# Patient Record
Sex: Male | Born: 1950
Health system: Southern US, Community
[De-identification: ages and names within clinical notes are randomized; demographics above are authoritative.]

## PROBLEM LIST (undated history)

## (undated) DIAGNOSIS — M1990 Unspecified osteoarthritis, unspecified site: Secondary | ICD-10-CM

## (undated) DIAGNOSIS — Z973 Presence of spectacles and contact lenses: Secondary | ICD-10-CM

## (undated) DIAGNOSIS — I10 Essential (primary) hypertension: Secondary | ICD-10-CM

## (undated) DIAGNOSIS — I493 Ventricular premature depolarization: Secondary | ICD-10-CM

## (undated) DIAGNOSIS — R269 Unspecified abnormalities of gait and mobility: Secondary | ICD-10-CM

## (undated) DIAGNOSIS — I499 Cardiac arrhythmia, unspecified: Secondary | ICD-10-CM

## (undated) DIAGNOSIS — C4490 Unspecified malignant neoplasm of skin, unspecified: Secondary | ICD-10-CM

## (undated) DIAGNOSIS — I639 Cerebral infarction, unspecified: Secondary | ICD-10-CM

## (undated) HISTORY — PX: COLONOSCOPY: SHX174

## (undated) HISTORY — DX: Unspecified abnormalities of gait and mobility: R26.9

## (undated) HISTORY — PX: SKIN CANCER EXCISION: SHX779

## (undated) HISTORY — PX: TOTAL HIP ARTHROPLASTY: SHX124

## (undated) HISTORY — PX: WISDOM TOOTH EXTRACTION: SHX21

---

## 2016-10-15 ENCOUNTER — Ambulatory Visit (INDEPENDENT_AMBULATORY_CARE_PROVIDER_SITE_OTHER): Payer: BLUE CROSS/BLUE SHIELD | Admitting: Neurology

## 2016-10-15 ENCOUNTER — Encounter: Payer: Self-pay | Admitting: Neurology

## 2016-10-15 VITALS — BP 104/73 | HR 82 | Ht 69.0 in | Wt 176.0 lb

## 2016-10-15 DIAGNOSIS — M25551 Pain in right hip: Secondary | ICD-10-CM

## 2016-10-15 DIAGNOSIS — R269 Unspecified abnormalities of gait and mobility: Secondary | ICD-10-CM

## 2016-10-15 DIAGNOSIS — M25552 Pain in left hip: Secondary | ICD-10-CM

## 2016-10-15 HISTORY — DX: Unspecified abnormalities of gait and mobility: R26.9

## 2016-10-15 NOTE — Progress Notes (Signed)
Reason for visit: gait disorder  Referring physician: Dr. Lyn Hollingshead is a 65 y.o. male  History of present illness:  Jose Kaufman is a 65 year old right-handed white male with a history of problems with walking and leg discomfort that began about one year ago. The patient indicates that he began having some problems with the right hip with some discomfort going down into the thigh to the knee. He denies pain into the groin area. The pain comes on with standing and weightbearing, it will tend to get better if he walks a ways. The patient more recently began having similar discomfort in the left hip and left thigh, but this pain is not as bad as it is on the right. If the patient rolls on his right side with sleeping at night, this will also produce pain. He denies any pain in the back or neck. He denies any issues controlling the bowels or the bladder. The patient has not had any falls, but he does have difficulty with walking, he may feel somewhat staggery. The patient has undergone a recent CAT scan of the abdomen and pelvis, he was found to have severe degenerative arthritis in the hips bilaterally, right greater than left. The patient is sent to this office for an evaluation of the gait disturbance.  Past Medical History:  Diagnosis Date  . Hypertension     History reviewed. No pertinent surgical history.  Family History  Problem Relation Age of Onset  . Leukemia Mother   . Pneumonia Father     Social history:  reports that he quit smoking about 24 years ago. His smoking use included Cigarettes. He has never used smokeless tobacco. He reports that he drinks alcohol. He reports that he does not use drugs.  Medications:  Prior to Admission medications   Medication Sig Start Date End Date Taking? Authorizing Provider  glucosamine-chondroitin 500-400 MG tablet Take 1 tablet by mouth 3 (three) times daily.   Yes Historical Provider, MD  lisinopril-hydrochlorothiazide  Reita May) 20-12.5 MG tablet  10/09/16  Yes Historical Provider, MD  naproxen sodium (ANAPROX) 220 MG tablet Take 220 mg by mouth 2 (two) times daily with a meal.   Yes Historical Provider, MD  Omega-3 Fatty Acids (FISH OIL PO) Take by mouth daily.   Yes Historical Provider, MD  TURMERIC CURCUMIN PO Take by mouth daily.   Yes Historical Provider, MD      Allergies  Allergen Reactions  . Penicillins     ROS:  Out of a complete 14 system review of symptoms, the patient complains only of the following symptoms, and all other reviewed systems are negative.  Weight loss Blood in the urine Joint pain, aching muscles  Blood pressure 104/73, pulse 82, height 5\' 9"  (1.753 m), weight 176 lb (79.8 kg).  Physical Exam  General: The patient is alert and cooperative at the time of the examination.  Eyes: Pupils are equal, round, and reactive to light. Discs are flat bilaterally.  Neck: The neck is supple, no carotid bruits are noted.  Respiratory: The respiratory examination is clear.  Cardiovascular: The cardiovascular examination reveals a regular rate and rhythm, no obvious murmurs or rubs are noted.  Neuromuscular: Range of movement of the hip joints is severely limited with rotational movement, and with abduction of the legs with standing. No significant pain is noted with passive range of movement.  Skin: Extremities are without significant edema.  Neurologic Exam  Mental status: The patient is alert and  oriented x 3 at the time of the examination. The patient has apparent normal recent and remote memory, with an apparently normal attention span and concentration ability.  Cranial nerves: Facial symmetry is present. There is good sensation of the face to pinprick and soft touch bilaterally. The strength of the facial muscles and the muscles to head turning and shoulder shrug are normal bilaterally. Speech is well enunciated, no aphasia or dysarthria is noted. Extraocular  movements are full. Visual fields are full. The tongue is midline, and the patient has symmetric elevation of the soft palate. No obvious hearing deficits are noted.  Motor: The motor testing reveals 5 over 5 strength of all 4 extremities. Good symmetric motor tone is noted throughout.  Sensory: Sensory testing is intact to pinprick, soft touch, vibration sensation, and position sense on all 4 extremities. No evidence of extinction is noted.  Coordination: Cerebellar testing reveals good finger-nose-finger and heel-to-shin bilaterally.  Gait and station: Gait is associated with taking short shuffling steps, slightly wide-based. Tandem gait is unsteady. Romberg is negative. No drift is seen.  Reflexes: Deep tendon reflexes are symmetric and normal in the arms, but reflexes are brisk in the legs, crossed adductor reflexes are seen bilaterally. Toes are upgoing bilaterally.   Assessment/Plan:  1. Bilateral hip pain, degenerative arthritis bilaterally, right greater than left  2. Gait disturbance  The patient appears to have 2 separate problems. The patient has evidence of significant degenerative arthritis of both hips, right greater than left. The pain that he is having with weightbearing is consistent with this. The patient has an unusual gait pattern, taking short shuffling steps, with gait instability. Examination shows increased reflexes in the legs and bilateral Babinski's. The patient may have a myelopathy over and above the hip arthritis. The patient will be set up for blood work today, he will have MRI evaluation of the cervical and thoracic spine. I will contact him with the results.  Jill Alexanders MD 10/15/2016 1:54 PM  [facility] [address] [place], [place] [address]  Phone [phone] Fax [phone]

## 2016-10-16 ENCOUNTER — Telehealth: Payer: Self-pay | Admitting: Neurology

## 2016-10-16 LAB — COMPREHENSIVE METABOLIC PANEL
ALBUMIN: 4.4 g/dL (ref 3.6–4.8)
ALK PHOS: 74 IU/L (ref 39–117)
ALT: 11 IU/L (ref 0–44)
AST: 12 IU/L (ref 0–40)
Albumin/Globulin Ratio: 1.5 (ref 1.2–2.2)
BUN / CREAT RATIO: 15 (ref 10–24)
BUN: 23 mg/dL (ref 8–27)
Bilirubin Total: 0.6 mg/dL (ref 0.0–1.2)
CO2: 25 mmol/L (ref 18–29)
CREATININE: 1.5 mg/dL — AB (ref 0.76–1.27)
Calcium: 9.2 mg/dL (ref 8.6–10.2)
Chloride: 97 mmol/L (ref 96–106)
GFR calc non Af Amer: 49 mL/min/{1.73_m2} — ABNORMAL LOW (ref >59)
GFR, EST AFRICAN AMERICAN: 56 mL/min/{1.73_m2} — AB (ref >59)
GLOBULIN, TOTAL: 2.9 g/dL (ref 1.5–4.5)
GLUCOSE: 84 mg/dL (ref 65–99)
Potassium: 4.3 mmol/L (ref 3.5–5.2)
SODIUM: 139 mmol/L (ref 134–144)
TOTAL PROTEIN: 7.3 g/dL (ref 6.0–8.5)

## 2016-10-16 LAB — RPR: RPR Ser Ql: NONREACTIVE

## 2016-10-16 LAB — SEDIMENTATION RATE: SED RATE: 5 mm/h (ref 0–30)

## 2016-10-16 LAB — VITAMIN B12: Vitamin B-12: 192 pg/mL — ABNORMAL LOW (ref 211–946)

## 2016-10-16 LAB — HTLV I+II ANTIBODIES, (EIA), BLD: HTLV I/II AB: NEGATIVE

## 2016-10-16 NOTE — Telephone Encounter (Signed)
I called again, again no answer, we will try to contact him tomorrow.

## 2016-10-16 NOTE — Telephone Encounter (Signed)
I tried to call the patient, no answer. Blood work shows a low B12 level, some renal insufficiency.  We will try to have him come in for a B12 shot, start him on oral B12 supplementation. I will call back later.

## 2016-10-17 NOTE — Telephone Encounter (Signed)
I called patient. No answer and no vm.

## 2016-10-17 NOTE — Telephone Encounter (Signed)
Tried to call pt again but still unable to reach him.

## 2016-10-18 NOTE — Telephone Encounter (Signed)
Was able to reach pt at his work #. Appt scheduled for B12 injection and pt agreed to start on OTC B12 supplement. Will call back w/ questions/concerns.

## 2016-10-29 ENCOUNTER — Other Ambulatory Visit: Payer: Self-pay | Admitting: *Deleted

## 2016-10-29 ENCOUNTER — Ambulatory Visit (INDEPENDENT_AMBULATORY_CARE_PROVIDER_SITE_OTHER): Payer: BLUE CROSS/BLUE SHIELD | Admitting: *Deleted

## 2016-10-29 DIAGNOSIS — E538 Deficiency of other specified B group vitamins: Secondary | ICD-10-CM | POA: Diagnosis not present

## 2016-10-29 MED ORDER — CYANOCOBALAMIN 1000 MCG/ML IJ SOLN
1000.0000 ug | Freq: Once | INTRAMUSCULAR | Status: AC
  Administered 2016-10-29: 1000 ug via INTRAMUSCULAR

## 2017-01-24 ENCOUNTER — Ambulatory Visit (INDEPENDENT_AMBULATORY_CARE_PROVIDER_SITE_OTHER): Payer: Self-pay

## 2017-01-24 ENCOUNTER — Ambulatory Visit (INDEPENDENT_AMBULATORY_CARE_PROVIDER_SITE_OTHER): Payer: BLUE CROSS/BLUE SHIELD | Admitting: Orthopaedic Surgery

## 2017-01-24 DIAGNOSIS — M1611 Unilateral primary osteoarthritis, right hip: Secondary | ICD-10-CM | POA: Diagnosis not present

## 2017-01-24 DIAGNOSIS — M25551 Pain in right hip: Secondary | ICD-10-CM | POA: Diagnosis not present

## 2017-01-24 NOTE — Progress Notes (Deleted)
   Office Visit Note   Patient: Jose Kaufman           Date of Birth: 13-Mar-1951           MRN: [id number] Visit Date: 01/24/2017              Requested by: Michel Harrow, [place], [place] [address] PCP: Michel Harrow, PA-C   Assessment & Plan: Visit Diagnoses:  1. Pain in right hip   2. Unilateral primary osteoarthritis, right hip     Plan: ***  Follow-Up Instructions: Return if symptoms worsen or fail to improve.   Orders:  Orders Placed This Encounter  Procedures  . XR HIP UNILAT W OR W/O PELVIS 1V RIGHT   No orders of the defined types were placed in this encounter.     Procedures: No procedures performed   Clinical Data: No additional findings.   Subjective: No chief complaint on file.   HPI  Review of Systems   Objective: Vital Signs: There were no vitals taken for this visit.  Physical Exam  Ortho Exam  Specialty Comments:  No specialty comments available.  Imaging: No results found.   PMFS History: Patient Active Problem List   Diagnosis Date Noted  . Bilateral hip pain 10/15/2016  . Abnormality of gait 10/15/2016   Past Medical History:  Diagnosis Date  . Abnormality of gait 10/15/2016  . Hypertension     Family History  Problem Relation Age of Onset  . Leukemia Mother   . Pneumonia Father     No past surgical history on file. Social History   Occupational History  . [employer]    Social History Main Topics  . Smoking status: Former Smoker    Types: Cigarettes    Quit date: 05/20/1992  . Smokeless tobacco: Never Used  . Alcohol use Yes     Comment: Occasional  . Drug use: No  . Sexual activity: Not on file       +

## 2017-01-24 NOTE — Progress Notes (Signed)
Office Visit Note   Patient: Jose Kaufman           Date of Birth: 09/26/1951           MRN: [id number] Visit Date: 01/24/2017              Requested by: Michel Harrow, [place], [place] [address] PCP: Michel Harrow, PA-C   Assessment & Plan: Visit Diagnoses:  1. Pain in right hip   2. Unilateral primary osteoarthritis, right hip     Plan: He would definitely like to schedule hip replacement surgery sometime for a month or more from now and is warmer weather. We long thorough discussion about hip replacement surgery including a thorough discussion of the risks and benefits of surgery. I showed him a hip model and gave him a copy of his x-rays and we talked about intraoperative and postoperative course. At this point conservative treatment would not work anymore. He can even provide a steroid injection in that right hip due to no joint space remaining at all. He is tried anti-inflammatories, rest, ice, heat, activity modification and walking with assistive device. He is already very thin individual as well. He will call when he decides to have the surgery scheduled.  Follow-Up Instructions: Return if symptoms worsen or fail to improve.   Orders:  Orders Placed This Encounter  Procedures  . XR HIP UNILAT W OR W/O PELVIS 1V RIGHT   No orders of the defined types were placed in this encounter.     Procedures: No procedures performed   Clinical Data: No additional findings.   Subjective: No chief complaint on file. The patient comes in with a chief complaint of bilateral hip pain with right worse than left. This been going on for over a year now. He's had a steroid injection that hip at one point and that helped some. He is now limping severely. He is tried anti-inflammatories, rest, heat, ice, activity modification and walking with assistive device. His pain is daily. It is in his groin. Is 10 out of 10. It's detrimentally affected his activities  daily living, his quality of life, and his mobility.  HPI  Review of Systems He denies any chest pain, headache, shortness of breath, fever, chills, nausea, vomiting.  Objective: Vital Signs: There were no vitals taken for this visit.  Physical Exam He is alert and oriented 3 and in no acute distress. Ortho Exam Lamination of both hip shows essentially almost no internal or external rotation of either hip Specialty Comments:  No specialty comments available.  Imaging: Xr Hip Unilat W Or W/o Pelvis 1v Right  Result Date: 01/24/2017 AP pelvis and lateral of his right hip show severe end-stage arthritis of both hips. The right is much worse than left. The right has complete loss of joint space. There severe trigger osteophytes and significant cystic changes with femoral head and acetabulum again there is no joint space remaining at all. He has severe arthritis on his left side as well.    PMFS History: Patient Active Problem List   Diagnosis Date Noted  . Bilateral hip pain 10/15/2016  . Abnormality of gait 10/15/2016   Past Medical History:  Diagnosis Date  . Abnormality of gait 10/15/2016  . Hypertension     Family History  Problem Relation Age of Onset  . Leukemia Mother   . Pneumonia Father     No past surgical history on file. Social History   Occupational History  .  [employer]    Social History Main Topics  . Smoking status: Former Smoker    Types: Cigarettes    Quit date: 05/20/1992  . Smokeless tobacco: Never Used  . Alcohol use Yes     Comment: Occasional  . Drug use: No  . Sexual activity: Not on file

## 2017-01-29 ENCOUNTER — Telehealth (INDEPENDENT_AMBULATORY_CARE_PROVIDER_SITE_OTHER): Payer: Self-pay | Admitting: Orthopaedic Surgery

## 2017-01-29 NOTE — Telephone Encounter (Signed)
Pt needs to schedule surgery, he stated he has emailed and tried to call as well.  Please give him a call back at [phone]

## 2017-01-31 NOTE — Telephone Encounter (Signed)
Spoke with patient yesterday and scheduled surgery. 

## 2017-02-26 NOTE — Progress Notes (Signed)
Surgery on 03/08/2017.  Preo pon 03/01/17.  Need orders in EPIC  Thank YOu.

## 2017-02-27 ENCOUNTER — Other Ambulatory Visit (INDEPENDENT_AMBULATORY_CARE_PROVIDER_SITE_OTHER): Payer: Self-pay | Admitting: Physician Assistant

## 2017-02-28 ENCOUNTER — Other Ambulatory Visit (HOSPITAL_COMMUNITY): Payer: Self-pay | Admitting: Emergency Medicine

## 2017-02-28 NOTE — Patient Instructions (Signed)
Jashua Knaak  02/28/2017   Your procedure is scheduled on: 03-08-17  Report to [facility] Main  Entrance take [facility]  elevators to 3rd floor to  [place] at 930AM.  Call this number if you have problems the morning of surgery [phone]   Remember: ONLY 1 PERSON MAY GO WITH YOU TO SHORT STAY TO GET  READY MORNING OF [facility].  Do not eat food or drink liquids :After Midnight.     Take these medicines the morning of surgery with A SIP OF WATER:  none                                You may not have any metal on your body including hair pins and              piercings  Do not wear jewelry, make-up, lotions, powders or perfumes, deodorant             Do not wear nail polish.  Do not shave  48 hours prior to surgery.              Men may shave face and neck.   Do not bring valuables to the hospital. [place].  Contacts, dentures or bridgework may not be worn into surgery.  Leave suitcase in the car. After surgery it may be brought to your room.               Please read over the following fact sheets you were given: _____________________________________________________________________             [facility] - Preparing for Surgery Before surgery, you can play an important role.  Because skin is not sterile, your skin needs to be as free of germs as possible.  You can reduce the number of germs on your skin by washing with CHG (chlorahexidine gluconate) soap before surgery.  CHG is an antiseptic cleaner which kills germs and bonds with the skin to continue killing germs even after washing. Please DO NOT use if you have an allergy to CHG or antibacterial soaps.  If your skin becomes reddened/irritated stop using the CHG and inform your nurse when you arrive at Short Stay. Do not shave (including legs and underarms) for at least 48 hours prior to the first CHG shower.  You may shave your  face/neck. Please follow these instructions carefully:  1.  Shower with CHG Soap the night before surgery and the  morning of Surgery.  2.  If you choose to wash your hair, wash your hair first as usual with your  normal  shampoo.  3.  After you shampoo, rinse your hair and body thoroughly to remove the  shampoo.                           4.  Use CHG as you would any other liquid soap.  You can apply chg directly  to the skin and wash                       Gently with a scrungie or clean washcloth.  5.  Apply the CHG Soap to your body ONLY  FROM THE NECK DOWN.   Do not use on face/ open                           Wound or open sores. Avoid contact with eyes, ears mouth and genitals (private parts).                       Wash face,  Genitals (private parts) with your normal soap.             6.  Wash thoroughly, paying special attention to the area where your surgery  will be performed.  7.  Thoroughly rinse your body with warm water from the neck down.  8.  DO NOT shower/wash with your normal soap after using and rinsing off  the CHG Soap.                9.  Pat yourself dry with a clean towel.            10.  Wear clean pajamas.            11.  Place clean sheets on your bed the night of your first shower and do not  sleep with pets. Day of Surgery : Do not apply any lotions/deodorants the morning of surgery.  Please wear clean clothes to the hospital/surgery center.  FAILURE TO FOLLOW THESE INSTRUCTIONS MAY RESULT IN THE CANCELLATION OF YOUR SURGERY PATIENT SIGNATURE_________________________________  NURSE SIGNATURE__________________________________  ________________________________________________________________________   Adam Phenix  An incentive spirometer is a tool that can help keep your lungs clear and active. This tool measures how well you are filling your lungs with each breath. Taking long deep breaths may help reverse or decrease the chance of developing breathing  (pulmonary) problems (especially infection) following:  A long period of time when you are unable to move or be active. BEFORE THE PROCEDURE   If the spirometer includes an indicator to show your best effort, your nurse or respiratory therapist will set it to a desired goal.  If possible, sit up straight or lean slightly forward. Try not to slouch.  Hold the incentive spirometer in an upright position. INSTRUCTIONS FOR USE  1. Sit on the edge of your bed if possible, or sit up as far as you can in bed or on a chair. 2. Hold the incentive spirometer in an upright position. 3. Breathe out normally. 4. Place the mouthpiece in your mouth and seal your lips tightly around it. 5. Breathe in slowly and as deeply as possible, raising the piston or the ball toward the top of the column. 6. Hold your breath for 3-5 seconds or for as long as possible. Allow the piston or ball to fall to the bottom of the column. 7. Remove the mouthpiece from your mouth and breathe out normally. 8. Rest for a few seconds and repeat Steps 1 through 7 at least 10 times every 1-2 hours when you are awake. Take your time and take a few normal breaths between deep breaths. 9. The spirometer may include an indicator to show your best effort. Use the indicator as a goal to work toward during each repetition. 10. After each set of 10 deep breaths, practice coughing to be sure your lungs are clear. If you have an incision (the cut made at the time of surgery), support your incision when coughing by placing a pillow or rolled up towels firmly against it. Once you  are able to get out of bed, walk around indoors and cough well. You may stop using the incentive spirometer when instructed by your caregiver.  RISKS AND COMPLICATIONS  Take your time so you do not get dizzy or light-headed.  If you are in pain, you may need to take or ask for pain medication before doing incentive spirometry. It is harder to take a deep breath if you  are having pain. AFTER USE  Rest and breathe slowly and easily.  It can be helpful to keep track of a log of your progress. Your caregiver can provide you with a simple table to help with this. If you are using the spirometer at home, follow these instructions: [place] IF:   You are having difficultly using the spirometer.  You have trouble using the spirometer as often as instructed.  Your pain medication is not giving enough relief while using the spirometer.  You develop fever of 100.5 F (38.1 C) or higher. SEEK IMMEDIATE MEDICAL CARE IF:   You cough up bloody sputum that had not been present before.  You develop fever of 102 F (38.9 C) or greater.  You develop worsening pain at or near the incision site. MAKE SURE YOU:   Understand these instructions.  Will watch your condition.  Will get help right away if you are not doing well or get worse. Document Released: 04/08/2007 Document Revised: 02/18/2012 Document Reviewed: 06/09/2007 [facility] Patient Information 2014 [place], [place].   ________________________________________________________________________

## 2017-03-01 ENCOUNTER — Encounter (HOSPITAL_COMMUNITY)
Admission: RE | Admit: 2017-03-01 | Discharge: 2017-03-01 | Disposition: A | Discharge status: Home / Self Care | Payer: BLUE CROSS/BLUE SHIELD | Source: Ambulatory Visit | Attending: Orthopaedic Surgery | Admitting: Orthopaedic Surgery

## 2017-03-01 ENCOUNTER — Encounter (HOSPITAL_COMMUNITY): Payer: Self-pay

## 2017-03-01 DIAGNOSIS — I10 Essential (primary) hypertension: Secondary | ICD-10-CM | POA: Insufficient documentation

## 2017-03-01 DIAGNOSIS — M1611 Unilateral primary osteoarthritis, right hip: Secondary | ICD-10-CM | POA: Insufficient documentation

## 2017-03-01 DIAGNOSIS — Z01810 Encounter for preprocedural cardiovascular examination: Secondary | ICD-10-CM | POA: Diagnosis present

## 2017-03-01 DIAGNOSIS — Z01812 Encounter for preprocedural laboratory examination: Secondary | ICD-10-CM | POA: Diagnosis not present

## 2017-03-01 LAB — CBC
HCT: 43.6 % (ref 39.0–52.0)
HEMOGLOBIN: 14.6 g/dL (ref 13.0–17.0)
MCH: 30 pg (ref 26.0–34.0)
MCHC: 33.5 g/dL (ref 30.0–36.0)
MCV: 89.7 fL (ref 78.0–100.0)
PLATELETS: 260 10*3/uL (ref 150–400)
RBC: 4.86 MIL/uL (ref 4.22–5.81)
RDW: 13.4 % (ref 11.5–15.5)
WBC: 8.6 10*3/uL (ref 4.0–10.5)

## 2017-03-01 LAB — BASIC METABOLIC PANEL
ANION GAP: 6 (ref 5–15)
BUN: 29 mg/dL — AB (ref 6–20)
CHLORIDE: 104 mmol/L (ref 101–111)
CO2: 25 mmol/L (ref 22–32)
Calcium: 9.5 mg/dL (ref 8.9–10.3)
Creatinine, Ser: 1.24 mg/dL (ref 0.61–1.24)
GFR calc Af Amer: >60 mL/min (ref >60)
GLUCOSE: 96 mg/dL (ref 65–99)
POTASSIUM: 4.2 mmol/L (ref 3.5–5.1)
Sodium: 135 mmol/L (ref 135–145)

## 2017-03-01 LAB — SURGICAL PCR SCREEN
MRSA, PCR: NEGATIVE
Staphylococcus aureus: NEGATIVE

## 2017-03-01 LAB — ABO/RH: ABO/RH(D): O POS

## 2017-03-07 ENCOUNTER — Inpatient Hospital Stay (HOSPITAL_COMMUNITY): Payer: BLUE CROSS/BLUE SHIELD | Admitting: Certified Registered Nurse Anesthetist

## 2017-03-08 ENCOUNTER — Ambulatory Visit (HOSPITAL_COMMUNITY): Payer: BLUE CROSS/BLUE SHIELD

## 2017-03-08 ENCOUNTER — Encounter (HOSPITAL_COMMUNITY)
Admission: RE | Disposition: A | Discharge status: Home / Self Care | Payer: Self-pay | Source: Ambulatory Visit | Attending: Orthopaedic Surgery

## 2017-03-08 ENCOUNTER — Encounter (HOSPITAL_COMMUNITY): Payer: Self-pay | Admitting: *Deleted

## 2017-03-08 ENCOUNTER — Ambulatory Visit (HOSPITAL_COMMUNITY)
Admission: RE | Admit: 2017-03-08 | Discharge: 2017-03-08 | Disposition: A | Discharge status: Home / Self Care | Payer: BLUE CROSS/BLUE SHIELD | Source: Ambulatory Visit | Attending: Orthopaedic Surgery | Admitting: Orthopaedic Surgery

## 2017-03-08 DIAGNOSIS — Z880 Allergy status to penicillin: Secondary | ICD-10-CM | POA: Insufficient documentation

## 2017-03-08 DIAGNOSIS — M1611 Unilateral primary osteoarthritis, right hip: Secondary | ICD-10-CM | POA: Diagnosis not present

## 2017-03-08 DIAGNOSIS — I10 Essential (primary) hypertension: Secondary | ICD-10-CM | POA: Insufficient documentation

## 2017-03-08 DIAGNOSIS — I493 Ventricular premature depolarization: Secondary | ICD-10-CM | POA: Insufficient documentation

## 2017-03-08 DIAGNOSIS — Z419 Encounter for procedure for purposes other than remedying health state, unspecified: Secondary | ICD-10-CM

## 2017-03-08 DIAGNOSIS — Z5309 Procedure and treatment not carried out because of other contraindication: Secondary | ICD-10-CM | POA: Diagnosis not present

## 2017-03-08 DIAGNOSIS — Z87891 Personal history of nicotine dependence: Secondary | ICD-10-CM | POA: Diagnosis not present

## 2017-03-08 DIAGNOSIS — M25551 Pain in right hip: Secondary | ICD-10-CM | POA: Diagnosis present

## 2017-03-08 HISTORY — DX: Essential (primary) hypertension: I10

## 2017-03-08 HISTORY — DX: Ventricular premature depolarization: I49.3

## 2017-03-08 LAB — TYPE AND SCREEN
ABO/RH(D): O POS
Antibody Screen: NEGATIVE

## 2017-03-08 SURGERY — ARTHROPLASTY, HIP, TOTAL, ANTERIOR APPROACH
Anesthesia: Spinal | Site: Hip | Laterality: Right

## 2017-03-08 MED ORDER — TRANEXAMIC ACID 1000 MG/10ML IV SOLN
1000.0000 mg | INTRAVENOUS | Status: DC
  Filled 2017-03-08: qty 10

## 2017-03-08 MED ORDER — PROPOFOL 10 MG/ML IV BOLUS
INTRAVENOUS | Status: AC
  Filled 2017-03-08: qty 20

## 2017-03-08 MED ORDER — FENTANYL CITRATE (PF) 100 MCG/2ML IJ SOLN
INTRAMUSCULAR | Status: AC
  Filled 2017-03-08: qty 2

## 2017-03-08 MED ORDER — CLINDAMYCIN PHOSPHATE 900 MG/50ML IV SOLN
900.0000 mg | INTRAVENOUS | Status: DC
  Filled 2017-03-08: qty 50

## 2017-03-08 MED ORDER — LIDOCAINE 2% (20 MG/ML) 5 ML SYRINGE
INTRAMUSCULAR | Status: AC
  Filled 2017-03-08: qty 5

## 2017-03-08 MED ORDER — MIDAZOLAM HCL 2 MG/2ML IJ SOLN
INTRAMUSCULAR | Status: AC
  Filled 2017-03-08: qty 2

## 2017-03-08 MED ORDER — ONDANSETRON HCL 4 MG/2ML IJ SOLN
INTRAMUSCULAR | Status: AC
  Filled 2017-03-08: qty 2

## 2017-03-08 MED ORDER — LACTATED RINGERS IV SOLN
INTRAVENOUS | Status: DC
  Administered 2017-03-08: 10:00:00 via INTRAVENOUS

## 2017-03-08 MED ORDER — CHLORHEXIDINE GLUCONATE 4 % EX LIQD: 60.0000 mL | Freq: Once | CUTANEOUS | Status: DC

## 2017-03-08 MED ORDER — DEXAMETHASONE SODIUM PHOSPHATE 10 MG/ML IJ SOLN
INTRAMUSCULAR | Status: AC
  Filled 2017-03-08: qty 1

## 2017-03-08 NOTE — Progress Notes (Signed)
Surgery cancelled due to continuing frequent multifocal PVCs noted on monitor. Referring for cardiology evaluation Preparing for discharge, awaiting transport home.

## 2017-03-08 NOTE — Progress Notes (Signed)
Dr. Ermalene Postin, anesthesilogist made aware of frequent PVCs on EKG and now on monitor. No new orders received.

## 2017-03-08 NOTE — H&P (Signed)
TOTAL HIP ADMISSION H&P  Patient is admitted for right total hip arthroplasty.  Subjective:  Chief Complaint: right hip pain  HPI: Jose Kaufman, 66 y.o. male, has a history of pain and functional disability in the right hip(s) due to arthritis and patient has failed non-surgical conservative treatments for greater than 12 weeks to include NSAID's and/or analgesics, corticosteriod injections and activity modification.  Onset of symptoms was gradual starting 2 years ago with rapidlly worsening course since that time.The patient noted no past surgery on the right hip(s).  Patient currently rates pain in the right hip at 10 out of 10 with activity. Patient has night pain, worsening of pain with activity and weight bearing, trendelenberg gait, pain that interfers with activities of daily living, pain with passive range of motion and crepitus. Patient has evidence of subchondral cysts, subchondral sclerosis, periarticular osteophytes and joint space narrowing by imaging studies. This condition presents safety issues increasing the risk of falls.  There is no current active infection.  Patient Active Problem List   Diagnosis Date Noted  . Unilateral primary osteoarthritis, right hip 03/08/2017  . Bilateral hip pain 10/15/2016  . Abnormality of gait 10/15/2016   Past Medical History:  Diagnosis Date  . Abnormality of gait 10/15/2016  . Hypertension     No past surgical history on file.  No prescriptions prior to admission.   Allergies  Allergen Reactions  . Penicillins Other (See Comments)    Has patient had a PCN reaction causing immediate rash, facial/tongue/throat swelling, SOB or lightheadedness with hypotension:unsure Has patient had a PCN reaction causing severe rash involving mucus membranes or skin necrosis:unsure Has patient had a PCN reaction that required hospitalization:No Has patient had a PCN reaction occurring within the last 10 years:No If all of the above answers are "NO",  then may proceed with Cephalosporin use. Childhood reaction (unknown reaction)    Social History  Substance Use Topics  . Smoking status: Former Smoker    Types: Cigarettes    Quit date: 05/20/1992  . Smokeless tobacco: Never Used  . Alcohol use Yes     Comment: Occasional    Family History  Problem Relation Age of Onset  . Leukemia Mother   . Pneumonia Father      Review of Systems  Musculoskeletal: Positive for joint pain.  All other systems reviewed and are negative.   Objective:  Physical Exam  Constitutional: He is oriented to person, place, and time. He appears well-developed and well-nourished.  HENT:  Head: Normocephalic and atraumatic.  Eyes: EOM are normal. Pupils are equal, round, and reactive to light.  Neck: Normal range of motion. Neck supple.  Cardiovascular: Normal rate and regular rhythm.   Respiratory: Effort normal and breath sounds normal.  GI: Soft. Bowel sounds are normal.  Musculoskeletal:       Right hip: He exhibits decreased range of motion, decreased strength, tenderness and bony tenderness.  Neurological: He is alert and oriented to person, place, and time.  Skin: Skin is warm and dry.  Psychiatric: He has a normal mood and affect.    Vital signs in last 24 hours:    Labs:   Estimated body mass index is 25.79 kg/m as calculated from the following:   Height as of 03/01/17: 5\' 8"  (1.727 m).   Weight as of 03/01/17: 169 lb 9.6 oz (76.9 kg).   Imaging Review Plain radiographs demonstrate severe degenerative joint disease of the right hip(s). The bone quality appears to be excellent for age  and reported activity level.  Assessment/Plan:  End stage arthritis, right hip(s)  The patient history, physical examination, clinical judgement of the provider and imaging studies are consistent with end stage degenerative joint disease of the right hip(s) and total hip arthroplasty is deemed medically necessary. The treatment options including  medical management, injection therapy, arthroscopy and arthroplasty were discussed at length. The risks and benefits of total hip arthroplasty were presented and reviewed. The risks due to aseptic loosening, infection, stiffness, dislocation/subluxation,  thromboembolic complications and other imponderables were discussed.  The patient acknowledged the explanation, agreed to proceed with the plan and consent was signed. Patient is being admitted for inpatient treatment for surgery, pain control, PT, OT, prophylactic antibiotics, VTE prophylaxis, progressive ambulation and ADL's and discharge planning.The patient is planning to be discharged home with home health services

## 2017-03-12 ENCOUNTER — Telehealth (INDEPENDENT_AMBULATORY_CARE_PROVIDER_SITE_OTHER): Payer: Self-pay | Admitting: Orthopaedic Surgery

## 2017-03-12 ENCOUNTER — Encounter (INDEPENDENT_AMBULATORY_CARE_PROVIDER_SITE_OTHER): Payer: Self-pay

## 2017-03-12 ENCOUNTER — Telehealth (INDEPENDENT_AMBULATORY_CARE_PROVIDER_SITE_OTHER): Payer: Self-pay | Admitting: *Deleted

## 2017-03-12 NOTE — Telephone Encounter (Signed)
Jose Kaufman will talk with him today

## 2017-03-12 NOTE — Telephone Encounter (Signed)
Faxed revised note to number provided.

## 2017-03-12 NOTE — Telephone Encounter (Signed)
Patient called wanting to know what he should do next since his surgery was canceled for this Friday. CB # [phone]

## 2017-03-12 NOTE — Telephone Encounter (Signed)
Pt called stating he didn't have surgery because he has an irregular heart beat and pt is confused what to do next. If he needs to see a cardiologist or make another appt with Dr. Ninfa Linden.  Pt stated to let the phone ring awhile, he has a hard time getting to the phone but will try to answer as quickly as he can.

## 2017-03-12 NOTE — Telephone Encounter (Signed)
Pt called stating he needs a note for work stating he cannot work due to his hip pain. FAX: [phone] [name]

## 2017-03-12 NOTE — Telephone Encounter (Signed)
Faxed note to provided number

## 2017-03-12 NOTE — Telephone Encounter (Signed)
Please advise 

## 2017-03-12 NOTE — Telephone Encounter (Signed)
Note faxed. Per Hetty Ely will speak with patient on what to do

## 2017-03-12 NOTE — Telephone Encounter (Signed)
Leanne from [employer] called saying that she received a fax from [place] not too long ago, stating the patient cannot come back to work currently due to his pain. She was wanting something more specific with dates for return sent over. Fax # [phone]

## 2017-03-14 ENCOUNTER — Encounter: Payer: Self-pay | Admitting: Cardiology

## 2017-03-14 ENCOUNTER — Encounter (HOSPITAL_COMMUNITY): Payer: Self-pay | Admitting: Cardiology

## 2017-03-14 DIAGNOSIS — I493 Ventricular premature depolarization: Secondary | ICD-10-CM | POA: Insufficient documentation

## 2017-03-14 DIAGNOSIS — I10 Essential (primary) hypertension: Secondary | ICD-10-CM | POA: Insufficient documentation

## 2017-03-14 HISTORY — DX: Essential (primary) hypertension: I10

## 2017-03-14 HISTORY — DX: Ventricular premature depolarization: I49.3

## 2017-03-20 NOTE — Progress Notes (Signed)
Who did patient see for cardiac clearance?  Is clearance being faxed to PST at [phone].  Please Call Gillian Shields RN at [phone] at [facility] regarding this.

## 2017-03-20 NOTE — Progress Notes (Signed)
Need orders placed in epic since orders were cancelled due to surgery cancelled.  Thanks.

## 2017-03-20 NOTE — Progress Notes (Signed)
Clearance from Dr Tollie Eth on chart - 03/19/2017  Office vist- Dr Wynonia Lawman- 03/14/2017- on chart   ECHO-03/18/2017 on chart   Patient's surgery cancelled from 03/08/2017.  Cardiac clearance needed.  See above for clearance.

## 2017-03-21 ENCOUNTER — Inpatient Hospital Stay (INDEPENDENT_AMBULATORY_CARE_PROVIDER_SITE_OTHER): Payer: BLUE CROSS/BLUE SHIELD | Admitting: Orthopaedic Surgery

## 2017-03-21 ENCOUNTER — Other Ambulatory Visit (INDEPENDENT_AMBULATORY_CARE_PROVIDER_SITE_OTHER): Payer: Self-pay | Admitting: Orthopaedic Surgery

## 2017-03-21 DIAGNOSIS — M1611 Unilateral primary osteoarthritis, right hip: Secondary | ICD-10-CM

## 2017-03-21 NOTE — Progress Notes (Signed)
Does this patient have another contact phone number besides the 1-800 work number- which they tell me he is on medical leave or the 288#?Marland Kitchen  I was trying to make sure he knows to arrive at 1145 in the am for surgery and surgery time at 145pm.  I attempted to call on 03/20/17 x 2 and this am on the 288 number with no answer.  Thanks.

## 2017-03-22 ENCOUNTER — Other Ambulatory Visit (INDEPENDENT_AMBULATORY_CARE_PROVIDER_SITE_OTHER): Payer: Self-pay | Admitting: Orthopaedic Surgery

## 2017-03-22 ENCOUNTER — Inpatient Hospital Stay (HOSPITAL_COMMUNITY): Payer: BLUE CROSS/BLUE SHIELD

## 2017-03-22 ENCOUNTER — Inpatient Hospital Stay (HOSPITAL_COMMUNITY): Payer: BLUE CROSS/BLUE SHIELD | Admitting: Certified Registered Nurse Anesthetist

## 2017-03-22 ENCOUNTER — Encounter (HOSPITAL_COMMUNITY): Payer: Self-pay | Admitting: *Deleted

## 2017-03-22 ENCOUNTER — Inpatient Hospital Stay (HOSPITAL_COMMUNITY)
Admission: RE | Admit: 2017-03-22 | Discharge: 2017-03-24 | DRG: 470 | Disposition: A | Discharge status: Home Health | Payer: BLUE CROSS/BLUE SHIELD | Source: Ambulatory Visit | Attending: Orthopaedic Surgery | Admitting: Orthopaedic Surgery

## 2017-03-22 ENCOUNTER — Encounter (HOSPITAL_COMMUNITY)
Admission: RE | Disposition: A | Discharge status: Home Health | Payer: Self-pay | Source: Ambulatory Visit | Attending: Orthopaedic Surgery

## 2017-03-22 DIAGNOSIS — I10 Essential (primary) hypertension: Secondary | ICD-10-CM | POA: Diagnosis present

## 2017-03-22 DIAGNOSIS — Z806 Family history of leukemia: Secondary | ICD-10-CM | POA: Diagnosis not present

## 2017-03-22 DIAGNOSIS — Z419 Encounter for procedure for purposes other than remedying health state, unspecified: Secondary | ICD-10-CM

## 2017-03-22 DIAGNOSIS — I493 Ventricular premature depolarization: Secondary | ICD-10-CM | POA: Diagnosis present

## 2017-03-22 DIAGNOSIS — Z880 Allergy status to penicillin: Secondary | ICD-10-CM | POA: Diagnosis not present

## 2017-03-22 DIAGNOSIS — Z96641 Presence of right artificial hip joint: Secondary | ICD-10-CM

## 2017-03-22 DIAGNOSIS — M1611 Unilateral primary osteoarthritis, right hip: Secondary | ICD-10-CM | POA: Diagnosis present

## 2017-03-22 DIAGNOSIS — Z87891 Personal history of nicotine dependence: Secondary | ICD-10-CM

## 2017-03-22 DIAGNOSIS — Z79891 Long term (current) use of opiate analgesic: Secondary | ICD-10-CM | POA: Diagnosis not present

## 2017-03-22 DIAGNOSIS — Z79899 Other long term (current) drug therapy: Secondary | ICD-10-CM | POA: Diagnosis not present

## 2017-03-22 DIAGNOSIS — R269 Unspecified abnormalities of gait and mobility: Secondary | ICD-10-CM | POA: Diagnosis present

## 2017-03-22 HISTORY — PX: TOTAL HIP ARTHROPLASTY: SHX124

## 2017-03-22 LAB — TYPE AND SCREEN
ABO/RH(D): O POS
Antibody Screen: NEGATIVE

## 2017-03-22 IMAGING — RF DG HIP (WITH PELVIS) OPERATIVE*R*
1 series · 2 of 2 positions shown · non-contrast
Comparison: None.

CLINICAL DATA: Right hip replaced

EXAM:
OPERATIVE RIGHT HIP WITH PELVIS

[Series 1: run · 2 of 2 slices shown]
[im 1/2]
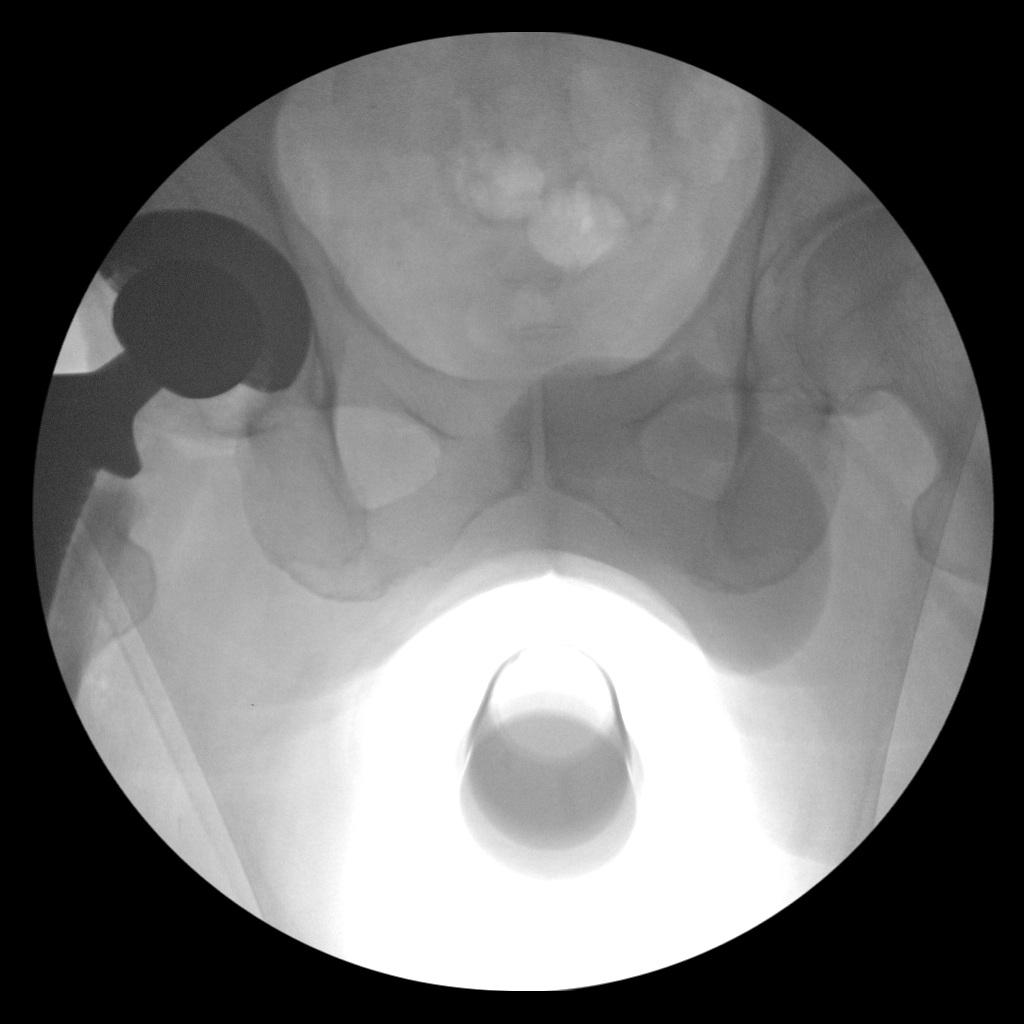
[im 2/2]
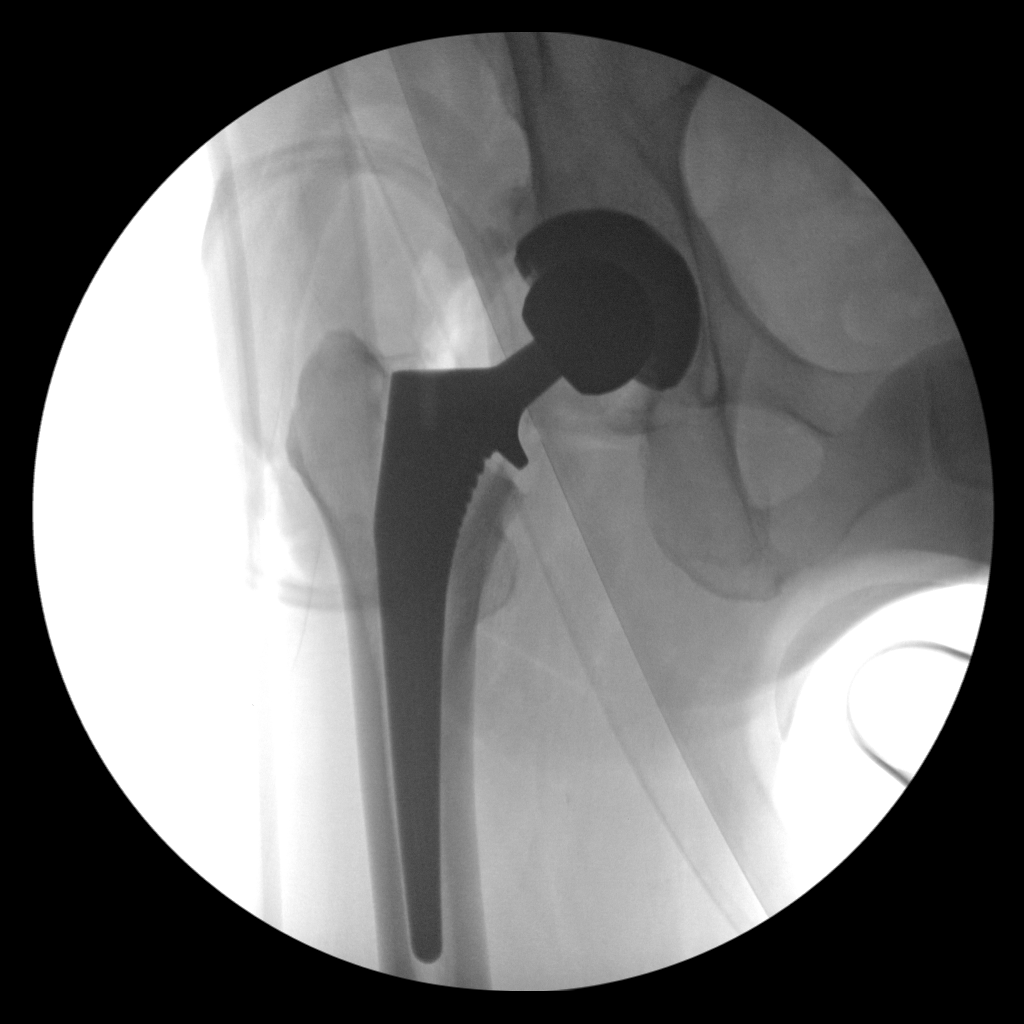

[2 of 2 positions shown; findings below may reference images not displayed]

FLUOROSCOPY TIME:  Radiation Exposure Index (as provided by the
fluoroscopic device): Not available

If the device does not provide the exposure index:

Fluoroscopy Time:  18 seconds

Number of Acquired Images:  2
FINDINGS: Right hip replacement is noted in satisfactory position. No acute
bony abnormality is seen.
IMPRESSION: Right hip replacement

## 2017-03-22 IMAGING — DX DG PORTABLE PELVIS
2 series · 2 of 2 positions shown · non-contrast
Comparison: None.

CLINICAL DATA: 65 y/o  M; status post total right hip replacement.

EXAM:
PORTABLE PELVIS 1-2 VIEWS

[pelvis ap (1 of 2)]
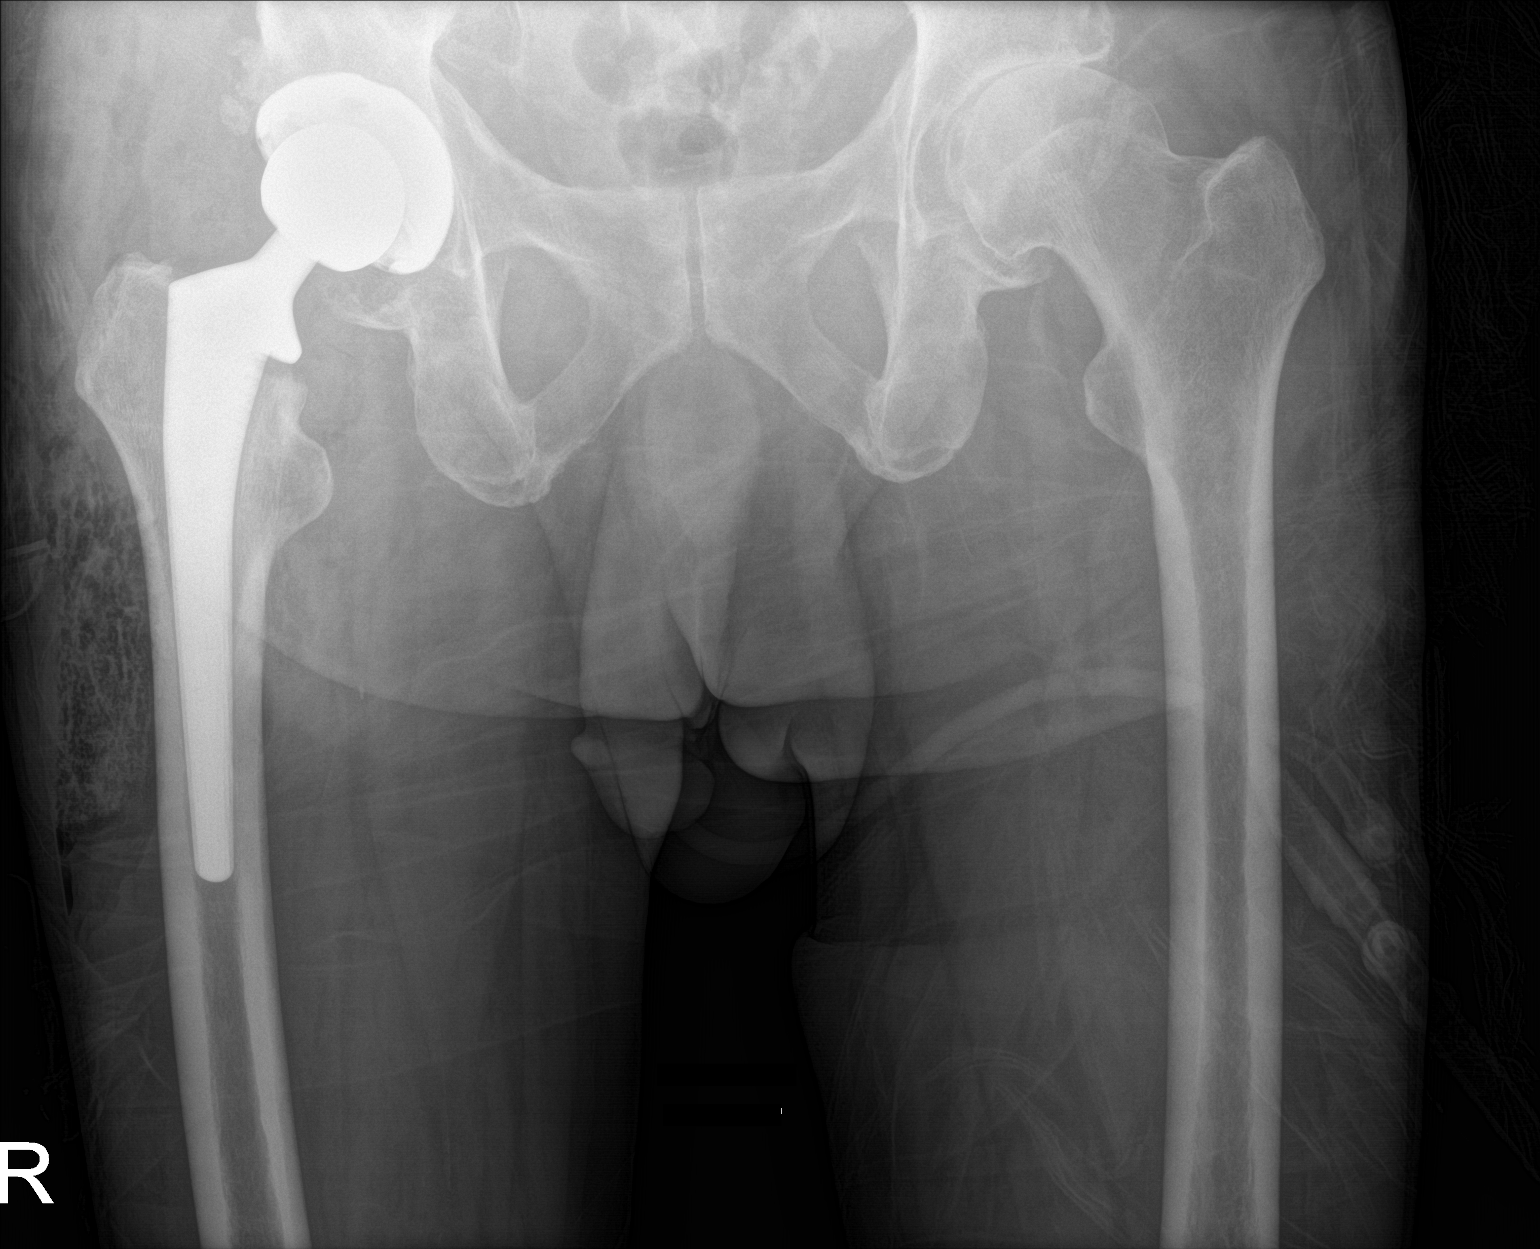

[pelvis ap (2 of 2)]
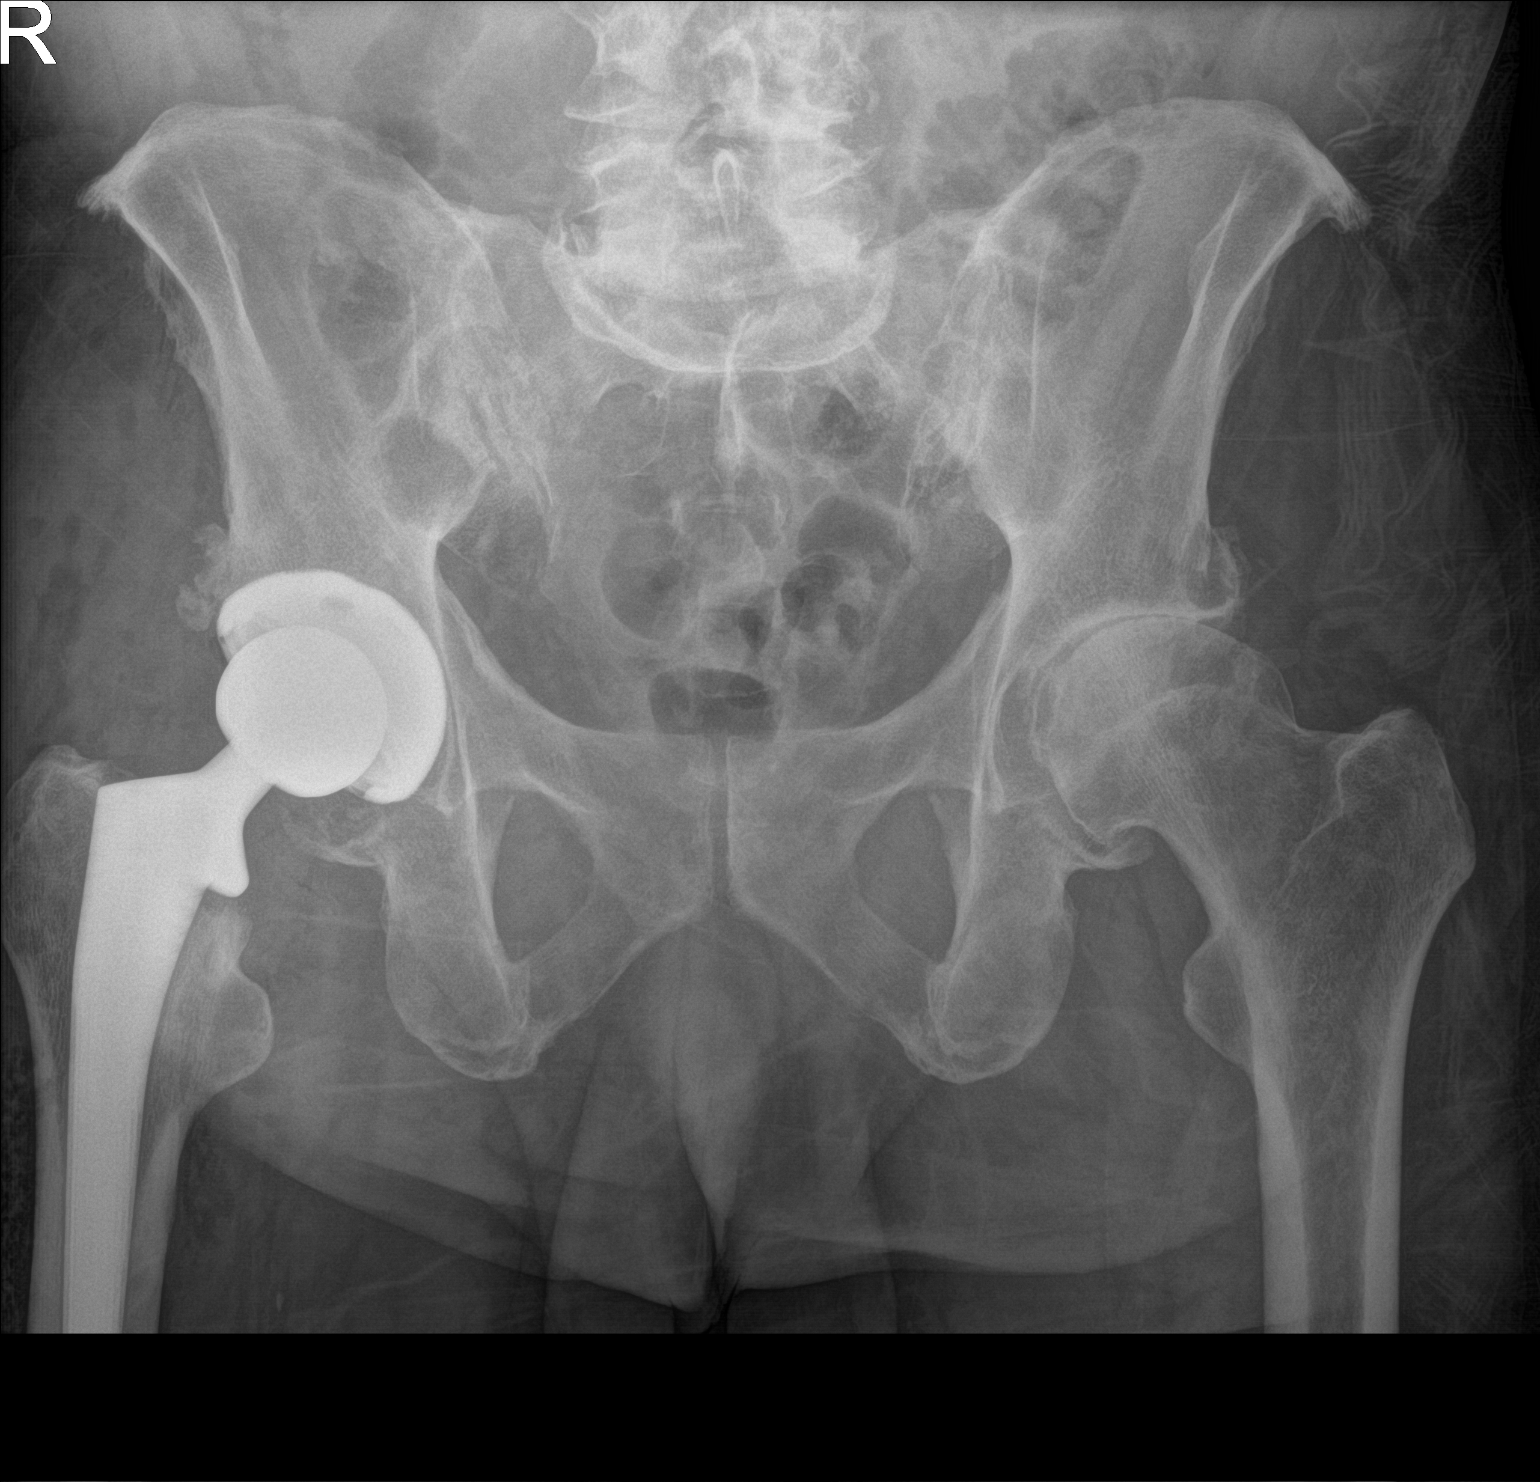

[2 of 2 positions shown; findings below may reference images not displayed]

FINDINGS: Total right hip arthroplasty. No apparent hardware related
complication. Air and edema and surrounding soft tissues is
compatible with postsurgical changes. No acute fracture or diastases
of the pelvis. Moderate lower lumbar degenerative changes. Moderate
osteoarthrosis of the left hip with superior loss of the joint
space. Thickening of the left femoral head neck junction may
represent CAM type acetabular impingement is a clinical setting.
IMPRESSION: 1. Total right hip arthroplasty. No apparent hardware related
complication.
2. Air and edema and surrounding soft tissues is compatible with
postsurgical changes.
3. No acute fracture or diastases of the pelvis.
4. Moderate osteoarthrosis of the left hip with superior loss of the
joint space.
5. Thickening of the left femoral head neck junction may represent
CAM type acetabular impingement is a clinical setting.

By: JEOVANY M.D.

## 2017-03-22 SURGERY — ARTHROPLASTY, HIP, TOTAL, ANTERIOR APPROACH
Anesthesia: Spinal | Site: Hip | Laterality: Right

## 2017-03-22 MED ORDER — ONDANSETRON HCL 4 MG/2ML IJ SOLN
INTRAMUSCULAR | Status: AC
  Filled 2017-03-22: qty 2

## 2017-03-22 MED ORDER — METOCLOPRAMIDE HCL 5 MG PO TABS: 5.0000 mg | ORAL_TABLET | Freq: Three times a day (TID) | ORAL | Status: DC | PRN

## 2017-03-22 MED ORDER — MIDAZOLAM HCL 5 MG/5ML IJ SOLN
INTRAMUSCULAR | Status: DC | PRN
  Administered 2017-03-22 (×2): 1 mg via INTRAVENOUS

## 2017-03-22 MED ORDER — BOOST / RESOURCE BREEZE PO LIQD
1.0000 | Freq: Three times a day (TID) | ORAL | Status: DC
  Administered 2017-03-22 – 2017-03-23 (×3): 1 via ORAL

## 2017-03-22 MED ORDER — FENTANYL CITRATE (PF) 100 MCG/2ML IJ SOLN: 25.0000 ug | INTRAMUSCULAR | Status: DC | PRN

## 2017-03-22 MED ORDER — METOCLOPRAMIDE HCL 5 MG/ML IJ SOLN: 5.0000 mg | Freq: Three times a day (TID) | INTRAMUSCULAR | Status: DC | PRN

## 2017-03-22 MED ORDER — OXYCODONE HCL 5 MG PO TABS
5.0000 mg | ORAL_TABLET | ORAL | Status: DC | PRN
  Administered 2017-03-22 – 2017-03-23 (×3): 10 mg via ORAL
  Filled 2017-03-22 (×3): qty 2

## 2017-03-22 MED ORDER — PHENOL 1.4 % MT LIQD: 1.0000 | OROMUCOSAL | Status: DC | PRN

## 2017-03-22 MED ORDER — KETOROLAC TROMETHAMINE 15 MG/ML IJ SOLN
7.5000 mg | Freq: Four times a day (QID) | INTRAMUSCULAR | Status: AC
  Administered 2017-03-22 – 2017-03-23 (×4): 7.5 mg via INTRAVENOUS
  Filled 2017-03-22 (×4): qty 1

## 2017-03-22 MED ORDER — BUPIVACAINE HCL (PF) 0.5 % IJ SOLN
INTRAMUSCULAR | Status: DC | PRN
  Administered 2017-03-22: 15 mL

## 2017-03-22 MED ORDER — OXYCODONE HCL 5 MG PO TABS: 5.0000 mg | ORAL_TABLET | Freq: Once | ORAL | Status: DC | PRN

## 2017-03-22 MED ORDER — DOCUSATE SODIUM 100 MG PO CAPS
100.0000 mg | ORAL_CAPSULE | Freq: Two times a day (BID) | ORAL | Status: DC
  Administered 2017-03-22 – 2017-03-24 (×4): 100 mg via ORAL
  Filled 2017-03-22 (×4): qty 1

## 2017-03-22 MED ORDER — HYDROMORPHONE HCL 2 MG/ML IJ SOLN: 0.2500 mg | INTRAMUSCULAR | Status: DC | PRN

## 2017-03-22 MED ORDER — TRANEXAMIC ACID 1000 MG/10ML IV SOLN
1000.0000 mg | INTRAVENOUS | Status: AC
  Administered 2017-03-22: 1000 mg via INTRAVENOUS
  Filled 2017-03-22: qty 1100

## 2017-03-22 MED ORDER — ASPIRIN 81 MG PO CHEW
81.0000 mg | CHEWABLE_TABLET | Freq: Two times a day (BID) | ORAL | Status: DC
  Administered 2017-03-22 – 2017-03-24 (×4): 81 mg via ORAL
  Filled 2017-03-22 (×4): qty 1

## 2017-03-22 MED ORDER — ALBUMIN HUMAN 5 % IV SOLN
INTRAVENOUS | Status: AC
  Administered 2017-03-22: 12.5 g
  Filled 2017-03-22: qty 250

## 2017-03-22 MED ORDER — MIDAZOLAM HCL 2 MG/2ML IJ SOLN
INTRAMUSCULAR | Status: AC
  Filled 2017-03-22: qty 2

## 2017-03-22 MED ORDER — PROPOFOL 500 MG/50ML IV EMUL
INTRAVENOUS | Status: DC | PRN
  Administered 2017-03-22: 75 ug/kg/min via INTRAVENOUS

## 2017-03-22 MED ORDER — FENTANYL CITRATE (PF) 100 MCG/2ML IJ SOLN
INTRAMUSCULAR | Status: AC
  Filled 2017-03-22: qty 2

## 2017-03-22 MED ORDER — ONDANSETRON HCL 4 MG PO TABS: 4.0000 mg | ORAL_TABLET | Freq: Four times a day (QID) | ORAL | Status: DC | PRN

## 2017-03-22 MED ORDER — ACETAMINOPHEN 650 MG RE SUPP: 650.0000 mg | Freq: Four times a day (QID) | RECTAL | Status: DC | PRN

## 2017-03-22 MED ORDER — FENTANYL CITRATE (PF) 100 MCG/2ML IJ SOLN
INTRAMUSCULAR | Status: DC | PRN
  Administered 2017-03-22: 50 ug via INTRAVENOUS

## 2017-03-22 MED ORDER — ACETAMINOPHEN 325 MG PO TABS: 650.0000 mg | ORAL_TABLET | Freq: Four times a day (QID) | ORAL | Status: DC | PRN

## 2017-03-22 MED ORDER — SODIUM CHLORIDE 0.9 % IR SOLN
Status: DC | PRN
  Administered 2017-03-22: 1000 mL

## 2017-03-22 MED ORDER — ONDANSETRON HCL 4 MG/2ML IJ SOLN: 4.0000 mg | Freq: Four times a day (QID) | INTRAMUSCULAR | Status: DC | PRN

## 2017-03-22 MED ORDER — ALUM & MAG HYDROXIDE-SIMETH 200-200-20 MG/5ML PO SUSP: 30.0000 mL | ORAL | Status: DC | PRN

## 2017-03-22 MED ORDER — PHENYLEPHRINE HCL 10 MG/ML IJ SOLN
INTRAVENOUS | Status: DC | PRN
  Administered 2017-03-22: 50 ug/min via INTRAVENOUS

## 2017-03-22 MED ORDER — DIPHENHYDRAMINE HCL 12.5 MG/5ML PO ELIX: 12.5000 mg | ORAL_SOLUTION | ORAL | Status: DC | PRN

## 2017-03-22 MED ORDER — SODIUM CHLORIDE 0.9 % IV SOLN
INTRAVENOUS | Status: DC
  Administered 2017-03-22: 22:00:00 via INTRAVENOUS

## 2017-03-22 MED ORDER — CLINDAMYCIN PHOSPHATE 900 MG/50ML IV SOLN
INTRAVENOUS | Status: AC
  Filled 2017-03-22: qty 50

## 2017-03-22 MED ORDER — CLINDAMYCIN PHOSPHATE 900 MG/50ML IV SOLN
900.0000 mg | INTRAVENOUS | Status: AC
  Administered 2017-03-22: 900 mg via INTRAVENOUS

## 2017-03-22 MED ORDER — ONDANSETRON HCL 4 MG/2ML IJ SOLN
INTRAMUSCULAR | Status: DC | PRN
  Administered 2017-03-22: 4 mg via INTRAVENOUS

## 2017-03-22 MED ORDER — OXYCODONE HCL 5 MG/5ML PO SOLN
5.0000 mg | Freq: Once | ORAL | Status: DC | PRN
  Filled 2017-03-22: qty 5

## 2017-03-22 MED ORDER — HYDROMORPHONE HCL 2 MG/ML IJ SOLN
1.0000 mg | INTRAMUSCULAR | Status: DC | PRN
  Administered 2017-03-23: 1 mg via INTRAVENOUS
  Filled 2017-03-22: qty 1

## 2017-03-22 MED ORDER — CLINDAMYCIN PHOSPHATE 600 MG/50ML IV SOLN
600.0000 mg | Freq: Four times a day (QID) | INTRAVENOUS | Status: AC
  Administered 2017-03-22 – 2017-03-23 (×2): 600 mg via INTRAVENOUS
  Filled 2017-03-22 (×3): qty 50

## 2017-03-22 MED ORDER — PROPOFOL 10 MG/ML IV BOLUS
INTRAVENOUS | Status: AC
  Filled 2017-03-22: qty 60

## 2017-03-22 MED ORDER — LIDOCAINE HCL (CARDIAC) 20 MG/ML IV SOLN
INTRAVENOUS | Status: DC | PRN
  Administered 2017-03-22: 50 mg via INTRAVENOUS

## 2017-03-22 MED ORDER — MENTHOL 3 MG MT LOZG: 1.0000 | LOZENGE | OROMUCOSAL | Status: DC | PRN

## 2017-03-22 MED ORDER — POLYETHYLENE GLYCOL 3350 17 G PO PACK
17.0000 g | PACK | Freq: Every day | ORAL | Status: DC | PRN
  Administered 2017-03-24: 17 g via ORAL
  Filled 2017-03-22: qty 1

## 2017-03-22 MED ORDER — METHOCARBAMOL 1000 MG/10ML IJ SOLN
500.0000 mg | Freq: Four times a day (QID) | INTRAVENOUS | Status: DC | PRN
  Administered 2017-03-22: 500 mg via INTRAVENOUS
  Filled 2017-03-22: qty 5
  Filled 2017-03-22: qty 550

## 2017-03-22 MED ORDER — LACTATED RINGERS IV SOLN
INTRAVENOUS | Status: DC
  Administered 2017-03-22 (×3): via INTRAVENOUS

## 2017-03-22 MED ORDER — BUPIVACAINE HCL (PF) 0.5 % IJ SOLN
INTRAMUSCULAR | Status: AC
  Filled 2017-03-22: qty 30

## 2017-03-22 MED ORDER — METHOCARBAMOL 500 MG PO TABS
500.0000 mg | ORAL_TABLET | Freq: Four times a day (QID) | ORAL | Status: DC | PRN
  Administered 2017-03-22 – 2017-03-23 (×2): 500 mg via ORAL
  Filled 2017-03-22 (×2): qty 1

## 2017-03-22 SURGICAL SUPPLY — 36 items
BAG ZIPLOCK 12X15 (MISCELLANEOUS) IMPLANT
BENZOIN TINCTURE PRP APPL 2/3 (GAUZE/BANDAGES/DRESSINGS) ×2 IMPLANT
BLADE SAW SGTL 18X1.27X75 (BLADE) ×2 IMPLANT
CAPT HIP TOTAL 2 ×2 IMPLANT
CELLS DAT CNTRL 66122 CELL SVR (MISCELLANEOUS) ×1 IMPLANT
CLOTH BEACON ORANGE TIMEOUT ST (SAFETY) ×2 IMPLANT
COVER PERINEAL POST (MISCELLANEOUS) ×2 IMPLANT
COVER SURGICAL LIGHT HANDLE (MISCELLANEOUS) ×2 IMPLANT
DRAPE STERI IOBAN 125X83 (DRAPES) ×2 IMPLANT
DRAPE U-SHAPE 47X51 STRL (DRAPES) ×4 IMPLANT
DRSG AQUACEL AG ADV 3.5X10 (GAUZE/BANDAGES/DRESSINGS) ×2 IMPLANT
DURAPREP 26ML APPLICATOR (WOUND CARE) ×2 IMPLANT
ELECT REM PT RETURN 15FT ADLT (MISCELLANEOUS) ×2 IMPLANT
GAUZE XEROFORM 1X8 LF (GAUZE/BANDAGES/DRESSINGS) IMPLANT
GLOVE BIO SURGEON STRL SZ7.5 (GLOVE) ×2 IMPLANT
GLOVE BIOGEL PI IND STRL 7.0 (GLOVE) ×4 IMPLANT
GLOVE BIOGEL PI IND STRL 8 (GLOVE) ×2 IMPLANT
GLOVE BIOGEL PI INDICATOR 7.0 (GLOVE) ×4
GLOVE BIOGEL PI INDICATOR 8 (GLOVE) ×2
GLOVE ECLIPSE 8.0 STRL XLNG CF (GLOVE) ×2 IMPLANT
GOWN STRL REUS W/TWL XL LVL3 (GOWN DISPOSABLE) ×8 IMPLANT
HANDPIECE INTERPULSE COAX TIP (DISPOSABLE) ×1
HOLDER FOLEY CATH W/STRAP (MISCELLANEOUS) ×2 IMPLANT
PACK ANTERIOR HIP CUSTOM (KITS) ×2 IMPLANT
RTRCTR WOUND ALEXIS 18CM MED (MISCELLANEOUS) ×2
SET HNDPC FAN SPRY TIP SCT (DISPOSABLE) ×1 IMPLANT
STAPLER VISISTAT 35W (STAPLE) IMPLANT
STRIP CLOSURE SKIN 1/2X4 (GAUZE/BANDAGES/DRESSINGS) ×2 IMPLANT
SUT ETHIBOND NAB CT1 #1 30IN (SUTURE) ×2 IMPLANT
SUT MNCRL AB 4-0 PS2 18 (SUTURE) ×2 IMPLANT
SUT VIC AB 0 CT1 36 (SUTURE) ×2 IMPLANT
SUT VIC AB 1 CT1 36 (SUTURE) ×2 IMPLANT
SUT VIC AB 2-0 CT1 27 (SUTURE) ×2
SUT VIC AB 2-0 CT1 TAPERPNT 27 (SUTURE) ×2 IMPLANT
TRAY FOLEY W/METER SILVER 16FR (SET/KITS/TRAYS/PACK) ×2 IMPLANT
YANKAUER SUCT BULB TIP 10FT TU (MISCELLANEOUS) ×2 IMPLANT

## 2017-03-22 NOTE — Transfer of Care (Signed)
Immediate Anesthesia Transfer of Care Note  Patient: Jose Kaufman  Procedure(s) Performed: Procedure(s): RIGHT TOTAL HIP ARTHROPLASTY ANTERIOR APPROACH (Right)  Patient Location: PACU  Anesthesia Type:Spinal  Level of Consciousness:  sedated, patient cooperative and responds to stimulation  Airway & Oxygen Therapy:Patient Spontanous Breathing and Patient connected to face mask oxgen  Post-op Assessment:  Report given to PACU RN and Post -op Vital signs reviewed and stable  Post vital signs:  Reviewed and stable  Last Vitals:  Vitals:   03/22/17 1150  BP: 103/65  Pulse: (!) 50  Resp: 18  Temp: 37 C    Complications: No apparent anesthesia complications

## 2017-03-22 NOTE — Brief Op Note (Signed)
03/22/2017  2:51 PM  PATIENT:  Jose Kaufman  66 y.o. male  PRE-OPERATIVE DIAGNOSIS:  osteoarthritis right hip  POST-OPERATIVE DIAGNOSIS:  osteoarthritis right hip  PROCEDURE:  Procedure(s): RIGHT TOTAL HIP ARTHROPLASTY ANTERIOR APPROACH (Right)  SURGEON:  Surgeon(s) and Role:    * Mcarthur Rossetti, MD - Primary  PHYSICIAN ASSISTANT: Benita Stabile, PA-C  ANESTHESIA:   spinal  EBL:  Total I/O In: 1500 [I.V.:1500] Out: 250 [Urine:100; Blood:150]  COUNTS:  YES  DICTATION: .Other Dictation: Dictation Number [id number]  PLAN OF CARE: Admit to inpatient   PATIENT DISPOSITION:  PACU - hemodynamically stable.   Delay start of Pharmacological VTE agent (>24hrs) due to surgical blood loss or risk of bleeding: no

## 2017-03-22 NOTE — Op Note (Signed)
Jose Kaufman, RHO NO.:  [id number]  MEDICAL RECORD NO.:  [id number]  LOCATION:  [facility]                         FACILITY:  [facility]  PHYSICIAN:  Lind Guest. Ninfa Linden, M.D.DATE OF BIRTH:  [birth year]  DATE OF PROCEDURE:  03/22/2017 DATE OF DISCHARGE:                              OPERATIVE REPORT   PREOPERATIVE DIAGNOSIS:  Primary osteoarthritis and degenerative joint disease, right hip.  POSTOPERATIVE DIAGNOSIS:  Primary osteoarthritis and degenerative joint disease, right hip.  PROCEDURE:  Right total hip arthroplasty through direct anterior approach.  IMPLANTS:  DePuy Sector Gription acetabular component size 56, size 36+ 4 polyethylene liner, size 13 Corail femoral component with varus offset, size 36+ 5 ceramic hip ball.  SURGEON:  Lind Guest. Ninfa Linden, MD.  ASSISTANT:  Erskine Emery, PA-C.  ANESTHESIA:  Spinal.  ANTIBIOTICS:  900 mg of IV clindamycin.  BLOOD LOSS:  150 mL.  COMPLICATIONS:  None.  INDICATIONS:  Mr. Jose Kaufman is a 66 year old gentleman with debilitating arthritis involving his right hip.  His x-ray showed complete loss of the joint space with cystic changes, sclerotic changes, and periarticular osteophytes.  He has tried and failed all forms of conservative treatment.  He walks with assistive device and gotten to where his pain is becoming quite debilitating.  At this point, he does wish to proceed with a total hip arthroplasty and this has been recommended to him.  He understands our goals are decreased pain, improved mobility, and overall improved quality of life.  He understands the risk of acute blood loss anemia, nerve and vessel injury, fracture, infection, dislocation, and DVT.  PROCEDURE DESCRIPTION:  After informed consent was obtained, appropriate right hip was marked.  He was brought to the operating room, where spinal anesthesia was obtained while he was on his stretcher.  A Foley catheter was placed and then both  feet had traction boots applied to them.  He was placed supine on the operating table.  His right operative hip was then prepped and draped with DuraPrep and sterile drapes.  Time- out was called.  He was identified as correct patient and correct right hip.  We then made an incision just inferior and posterior to the anterosuperior iliac spine and carried this obliquely down the leg.  We dissected down the tensor fascia lata muscle.  The tensor fascia was then divided longitudinally to proceed with a direct anterior approach to the hip.  We identified and cauterized the circumflex vessels and opened up the hip capsule in an L-type format finding a large joint effusion and significant periarticular osteophytes.  We placed Cobra retractors around the lateral medial femoral neck and then made our femoral neck cut with an oscillating saw proximal to the lesser trochanter and completed this with an osteotome.  We placed a corkscrew guide in the femoral head and removed the femoral head in its entirety and found to be completely devoid of cartilage.  We then cleaned the acetabular remnants, acetabular labrum, and other debris.  I placed a bent Hohmann over the medial acetabular rim and then began reaming under direct visualization from a size 43 reamer up to a size 56.  All reamers were placed under direct visualization and  last reamer under direct fluoroscopy, so I could obtain my depth of reaming, my inclination and anteversion.  I then placed the real DePuy Sector Gription acetabular component size 56 and a 36+ 4 polyethylene liner for that size acetabular component.  Attention was then turned to the femur with the leg externally rotated to 120 degrees, extended, and adducted.  We were to place a Mueller retractor medially and a Hohmann retractor behind the greater trochanter.  We released lateral joint capsule and used a box cutting osteotome to the inner femoral canal and a rongeur  to lateralize.  I then began broaching from a size 8 broach using Corail broaching system going up to a size 13.  With the 13 in place, we trialed a varus offset femoral neck based off his anatomy and a 36+ 1 hip ball.  We rolled leg back over and up with traction and internal rotation reducing the pelvis and we were pleased with stability, but I felt like we needed just a touch more offset and leg length.  We then dislocated the hip and removed the trial components.  We were able to place the real Corail femoral component with varus offset size 13 and a real 36+ 5 hip ball and reduced this in the acetabulum.  We were pleased with stability and we felt that his leg length were back on.  Of note, he started significantly short preoperative.  We then irrigated the soft tissue with normal saline solution using pulsatile lavage.  We closed the joint capsule with interrupted #1 Ethibond suture followed by running #1 Vicryl in the tensor fascia, 0 Vicryl in the deep tissue, 2-0 Vicryl in the subcutaneous tissue, 4-0 Monocryl subcuticular stitch, and Steri-Strips on the skin.  Aquacel dressing was applied.  He was taken to recovery room in stable condition.  All final counts were correct. There were no complications noted.  Of note, Erskine Emery, PA-C, assisted in the entire case.  His assistance was crucial for facilitating all aspects of this case.     Lind Guest. Ninfa Linden, M.D.     CYB/MEDQ  D:  03/22/2017  T:  03/22/2017  Job:  [id number]

## 2017-03-22 NOTE — Anesthesia Procedure Notes (Signed)
Spinal  Patient location during procedure: OR Start time: 03/22/2017 1:23 PM End time: 03/22/2017 1:24 PM Staffing Anesthesiologist: Belinda Block Resident/CRNA: Claudia Desanctis Performed: resident/CRNA  Preanesthetic Checklist Completed: patient identified, site marked, surgical consent, pre-op evaluation, timeout performed, IV checked, risks and benefits discussed and monitors and equipment checked Spinal Block Patient position: sitting Prep: ChloraPrep Patient monitoring: heart rate, cardiac monitor, continuous pulse ox and blood pressure Approach: midline Location: L3-4 Injection technique: single-shot Needle Needle type: Pencil-Tip and Pencan  Needle gauge: 24 G Needle length: 10 cm Needle insertion depth: 7 cm Assessment Sensory level: T6

## 2017-03-22 NOTE — Anesthesia Preprocedure Evaluation (Addendum)
Anesthesia Evaluation  Patient identified by MRN, date of birth, ID band Patient awake    Reviewed: Allergy & Precautions, NPO status , Patient's Chart, lab work & pertinent test results  History of Anesthesia Complications (+) DIFFICULT IV STICK / SPECIAL LINE  Airway Mallampati: II  TM Distance: >3 FB     Dental   Pulmonary former smoker,    breath sounds clear to auscultation       Cardiovascular hypertension,  Rhythm:Regular Rate:Normal     Neuro/Psych    GI/Hepatic negative GI ROS, Neg liver ROS,   Endo/Other  negative endocrine ROS  Renal/GU negative Renal ROS     Musculoskeletal  (+) Arthritis ,   Abdominal   Peds  Hematology   Anesthesia Other Findings   Reproductive/Obstetrics                            Anesthesia Physical Anesthesia Plan  ASA: III  Anesthesia Plan: Spinal   Post-op Pain Management:    Induction: Intravenous  Airway Management Planned: Simple Face Mask  Additional Equipment:   Intra-op Plan:   Post-operative Plan:   Informed Consent: I have reviewed the patients History and Physical, chart, labs and discussed the procedure including the risks, benefits and alternatives for the proposed anesthesia with the patient or authorized representative who has indicated his/her understanding and acceptance.   Dental advisory given  Plan Discussed with: CRNA and Anesthesiologist  Anesthesia Plan Comments:         Anesthesia Quick Evaluation

## 2017-03-22 NOTE — Anesthesia Postprocedure Evaluation (Signed)
Anesthesia Post Note  Patient: Jose Kaufman  Procedure(s) Performed: Procedure(s) (LRB): RIGHT TOTAL HIP ARTHROPLASTY ANTERIOR APPROACH (Right)  Patient location during evaluation: PACU Anesthesia Type: Spinal and General Level of consciousness: awake Pain management: pain level controlled Vital Signs Assessment: post-procedure vital signs reviewed and stable Respiratory status: spontaneous breathing Anesthetic complications: no       Last Vitals:  Vitals:   03/22/17 1600 03/22/17 1615  BP: (!) 83/71 (!) 97/56  Pulse: 87 (!) 42  Resp: (!) 25 15  Temp:      Last Pain:  Vitals:   03/22/17 1500  TempSrc:   PainSc: Asleep                 Robby Pirani

## 2017-03-22 NOTE — H&P (Signed)
He presents once again for a right total hip replacement.  He understands fully the risks and benefits of surgery and informed consent is obtained.  Since his last H&P, he has been seen by Cardiology due to [facility] [place] and has been cleared for surgery.  I spoke in person to Dr. Wynonia Lawman, his Cardiologist, to verify this as well.  Nothing else has changed since his last H&P otherwise and he does wish to proceed with surgery today.

## 2017-03-23 LAB — BASIC METABOLIC PANEL
ANION GAP: 6 (ref 5–15)
BUN: 24 mg/dL — ABNORMAL HIGH (ref 6–20)
CALCIUM: 8.4 mg/dL — AB (ref 8.9–10.3)
CO2: 27 mmol/L (ref 22–32)
CREATININE: 1.37 mg/dL — AB (ref 0.61–1.24)
Chloride: 102 mmol/L (ref 101–111)
GFR, EST NON AFRICAN AMERICAN: 53 mL/min — AB (ref >60)
GLUCOSE: 100 mg/dL — AB (ref 65–99)
Potassium: 4.2 mmol/L (ref 3.5–5.1)
Sodium: 135 mmol/L (ref 135–145)

## 2017-03-23 LAB — CBC
HCT: 33.2 % — ABNORMAL LOW (ref 39.0–52.0)
HEMOGLOBIN: 10.9 g/dL — AB (ref 13.0–17.0)
MCH: 29 pg (ref 26.0–34.0)
MCHC: 32.8 g/dL (ref 30.0–36.0)
MCV: 88.3 fL (ref 78.0–100.0)
PLATELETS: 198 10*3/uL (ref 150–400)
RBC: 3.76 MIL/uL — AB (ref 4.22–5.81)
RDW: 13.2 % (ref 11.5–15.5)
WBC: 9.3 10*3/uL (ref 4.0–10.5)

## 2017-03-23 MED ORDER — OXYCODONE HCL 5 MG PO TABS
5.0000 mg | ORAL_TABLET | ORAL | Status: DC | PRN
  Administered 2017-03-23 – 2017-03-24 (×2): 10 mg via ORAL
  Filled 2017-03-23 (×2): qty 2

## 2017-03-23 NOTE — Progress Notes (Signed)
PT Progress Note  Pt with improved gait during second PT session, ambulating 150 ft with rw. Shuffling/festinating pattern with turns and close spaces but improving in halls. Pt states that he will have his brother and sister available to assist at D/C. PT to follow to progress mobility and safety.    03/23/17 1414  PT Visit Information  Last PT Received On 03/23/17  Assistance Needed +1  History of Present Illness 65 y.o. male s/p Rt anterior THA. PMH: PVCs, THN, abnormality of gait  Subjective Data  Subjective Doing a little better, hip is still sore.   Patient Stated Goal home  Precautions  Precautions Fall  Restrictions  Weight Bearing Restrictions Yes  RLE Weight Bearing WBAT  Pain Assessment  Pain Assessment 0-10  Pain Score 5  Pain Location Rt hip  Pain Descriptors / Indicators Aching  Pain Intervention(s) Limited activity within patient's tolerance;Monitored during session (declined ice)  Cognition  Arousal/Alertness Awake/alert  Behavior During Therapy WFL for tasks assessed/performed  Overall Cognitive Status Within Functional Limits for tasks assessed  Bed Mobility  Overal bed mobility Needs Assistance  Bed Mobility Sit to Supine  Sit to supine Min guard  Transfers  Overall transfer level Needs assistance  Equipment used Rolling walker (2 wheeled)  Transfers Sit to/from Stand  Sit to Stand Min guard  General transfer comment good hand placement  Ambulation/Gait  Ambulation/Gait assistance Min guard  Ambulation Distance (Feet) 150 Feet  Assistive device Rolling walker (2 wheeled)  Gait Pattern/deviations Step-through pattern;Decreased stride length  General Gait Details variable pattern, festernating pattern with turns and small spaces, improved stride in halls.   Gait velocity variable  Balance  Overall balance assessment Needs assistance  Sitting-balance support No upper extremity supported  Sitting balance-Leahy Scale Good  Standing balance support  Bilateral upper extremity supported  Standing balance-Leahy Scale Poor  Standing balance comment requiring use of rw  Exercises  Exercises Total Joint  Total Joint Exercises  Ankle Circles/Pumps AROM;Both;20 reps  Quad Sets Strengthening;Both;10 reps  Gluteal Sets Strengthening;Both;10 reps  PT - End of Session  Equipment Utilized During Treatment Gait belt  Activity Tolerance Patient tolerated treatment well  Patient left in bed;with call bell/phone within reach  Nurse Communication Mobility status  PT - Assessment/Plan  PT Plan Current plan remains appropriate  PT Visit Diagnosis Unsteadiness on feet (R26.81);Muscle weakness (generalized) (M62.81)  PT Frequency (ACUTE ONLY) 7X/week  Follow Up Recommendations Home health PT;Supervision for mobility/OOB  PT equipment Rolling walker with 5" wheels  AM-PAC PT "6 Clicks" Daily Activity Outcome Measure  Difficulty turning over in bed (including adjusting bedclothes, sheets and blankets)? 3  Difficulty moving from lying on back to sitting on the side of the bed?  3  Difficulty sitting down on and standing up from a chair with arms (e.g., wheelchair, bedside commode, etc,.)? 3  Help needed moving to and from a bed to chair (including a wheelchair)? 3  Help needed walking in hospital room? 3  Help needed climbing 3-5 steps with a railing?  2  6 Click Score 17  Mobility G Code  CK  PT Goal Progression  Progress towards PT goals Progressing toward goals  Acute Rehab PT Goals  PT Goal Formulation With patient  Time For Goal Achievement 04/06/17  Potential to Achieve Goals Good  PT Time Calculation  PT Start Time (ACUTE ONLY) 1348  PT Stop Time (ACUTE ONLY) 1411  PT Time Calculation (min) (ACUTE ONLY) 23 min  PT General Charges  $$  ACUTE PT VISIT 1 Procedure  PT Treatments  $Gait Training 8-22 mins  $Therapeutic Exercise 8-22 mins  Cassell Clement, PT, [place] Pager [phone] Office [phone]

## 2017-03-23 NOTE — Progress Notes (Signed)
Subjective: 1 Day Post-Op Procedure(s) (LRB): RIGHT TOTAL HIP ARTHROPLASTY ANTERIOR APPROACH (Right) Patient reports pain as moderate - better this am than last evening.   Objective: Vital signs in last 24 hours: Temp:  [97.4 F (36.3 C)-99.2 F (37.3 C)] 98.2 F (36.8 C) (04/14 0500) Pulse Rate:  [34-89] 88 (04/14 0500) Resp:  [13-25] 18 (04/14 0500) BP: (75-159)/(49-123) 103/70 (04/14 0500) SpO2:  [97 %-100 %] 100 % (04/14 0500) Weight:  [169 lb (76.7 kg)] 169 lb (76.7 kg) (04/13 1243)  Intake/Output from previous day: 04/13 0701 - 04/14 0700 In: 4421.3 [P.O.:840; I.V.:3281.3; IV Piggyback:300] Out: 1360 [Urine:100; Blood:150] Intake/Output this shift: No intake/output data recorded.   Recent Labs  03/23/17 0446  HGB 10.9*    Recent Labs  03/23/17 0446  WBC 9.3  RBC 3.76*  HCT 33.2*  PLT 198    Recent Labs  03/23/17 0446  NA 135  K 4.2  CL 102  CO2 27  BUN 24*  CREATININE 1.37*  GLUCOSE 100*  CALCIUM 8.4*   No results for input(s): LABPT, INR in the last 72 hours.  Sensation intact distally Intact pulses distally Dorsiflexion/Plantar flexion intact Incision: dressing C/D/I  Assessment/Plan: 1 Day Post-Op Procedure(s) (LRB): RIGHT TOTAL HIP ARTHROPLASTY ANTERIOR APPROACH (Right) Up with therapy Plan for discharge tomorrow vs Monday pending mobility and pain control.  Mcarthur Rossetti 03/23/2017, 9:23 AM

## 2017-03-23 NOTE — Evaluation (Signed)
Physical Therapy Evaluation Patient Details Name: Jose Kaufman MRN: [id number] DOB: May 16, 1951 Today's Date: 03/23/2017   History of Present Illness  66 y.o. male s/p Rt anterior THA. PMH: PVCs, THN, abnormality of gait  Clinical Impression  Pt is s/p anterior THA resulting in the deficits listed below (see PT Problem List). Pt able to ambulate 25 ft with rw and min guard assist. Noted tendency for shuffling pattern but improving with cues. Pt states that his brother will be staying with him for a couple days after D/C. Will continue with PT services to maximize mobility and independence prior to D/C home.      Follow Up Recommendations Home health PT;Supervision for mobility/OOB    Equipment Recommendations  Rolling walker with 5" wheels    Recommendations for Other Services       Precautions / Restrictions Precautions Precautions: Fall Restrictions Weight Bearing Restrictions: Yes RLE Weight Bearing: Weight bearing as tolerated      Mobility  Bed Mobility Overal bed mobility: Needs Assistance Bed Mobility: Supine to Sit     Supine to sit: Min assist     General bed mobility comments: cues for sequence, assist with rail  Transfers Overall transfer level: Needs assistance Equipment used: Rolling walker (2 wheeled) Transfers: Sit to/from Stand Sit to Stand: Min assist         General transfer comment: cues for sequence  Ambulation/Gait Ambulation/Gait assistance: Min guard Ambulation Distance (Feet): 25 Feet Assistive device: Rolling walker (2 wheeled) Gait Pattern/deviations: Step-to pattern;Decreased stride length Gait velocity: decreased   General Gait Details: Pt with tendency to revert to shuffling gait pattern, improving with cues for increased stride length  Stairs            Wheelchair Mobility    Modified Rankin (Stroke Patients Only)       Balance Overall balance assessment: Needs assistance Sitting-balance support: No upper  extremity supported Sitting balance-Leahy Scale: Good     Standing balance support: Bilateral upper extremity supported Standing balance-Leahy Scale: Poor Standing balance comment: requiring use of rw                             Pertinent Vitals/Pain Pain Assessment: 0-10 Pain Score: 3  Pain Location: Rt hip Pain Descriptors / Indicators: Aching Pain Intervention(s): Limited activity within patient's tolerance;Monitored during session    Home Living Family/patient expects to be discharged to:: Private residence Living Arrangements: Alone Available Help at Discharge: Family;Available 24 hours/day Type of Home: House Home Access: Stairs to enter Entrance Stairs-Rails: Psychiatric nurse of Steps: 3 Home Layout: One level Home Equipment: Cane - single point Additional Comments: Brother will stay with pt for a couple days following D/C.     Prior Function Level of Independence: Independent               Hand Dominance        Extremity/Trunk Assessment   Upper Extremity Assessment Upper Extremity Assessment: Defer to OT evaluation    Lower Extremity Assessment Lower Extremity Assessment: RLE deficits/detail RLE Deficits / Details: assist needed with moving Rt LE laterally in bed.        Communication   Communication: No difficulties  Cognition Arousal/Alertness: Awake/alert Behavior During Therapy: WFL for tasks assessed/performed Overall Cognitive Status: Within Functional Limits for tasks assessed  General Comments: Pt with mild confusion at times, needing reminder for gait sequence.       General Comments      Exercises     Assessment/Plan    PT Assessment Patient needs continued PT services  PT Problem List Decreased strength;Decreased range of motion;Decreased activity tolerance;Decreased balance;Decreased mobility       PT Treatment Interventions DME instruction;Gait  training;Stair training;Functional mobility training;Therapeutic activities;Therapeutic exercise;Patient/family education    PT Goals (Current goals can be found in the Care Plan section)  Acute Rehab PT Goals Patient Stated Goal: get back home PT Goal Formulation: With patient Time For Goal Achievement: 04/06/17 Potential to Achieve Goals: Good    Frequency 7X/week   Barriers to discharge        Co-evaluation               End of Session Equipment Utilized During Treatment: Gait belt Activity Tolerance: Patient tolerated treatment well Patient left: in chair;with call bell/phone within reach Nurse Communication: Mobility status PT Visit Diagnosis: Unsteadiness on feet (R26.81);Muscle weakness (generalized) (M62.81)    Time: 2563-8937 PT Time Calculation (min) (ACUTE ONLY): 32 min   Charges:   PT Evaluation $PT Eval Moderate Complexity: 1 Procedure PT Treatments $Gait Training: 8-22 mins   PT G Codes:        Cassell Clement, PT, CSCS Pager [phone] Office [place] 03/23/2017, 12:43 PM

## 2017-03-23 NOTE — Evaluation (Signed)
Occupational Therapy Evaluation Patient Details Name: Nico Syme MRN: [id number] DOB: 24-Jun-1951 Today's Date: 03/23/2017    History of Present Illness 66 y.o. male s/p Rt anterior THA. PMH: PVCs, THN, abnormality of gait   Clinical Impression   Pt up to practice 3in1 transfer with walker. He needed mod cues for sequencing and following through with tasks. He seemed to have some difficulty with processing commands at times. Discussed at length having his brother stay with him more than the first day that he was expecting--recommended he have his brother with him for the first week. Will continue to follow to progress ADL independence and safety.    Follow Up Recommendations  No OT follow up;Supervision/Assistance - 24 hour    Equipment Recommendations  3 in 1 bedside commode    Recommendations for Other Services       Precautions / Restrictions Precautions Precautions: Fall Restrictions Weight Bearing Restrictions: No RLE Weight Bearing: Weight bearing as tolerated      Mobility Bed Mobility      General bed mobility comments: in chair.   Transfers Overall transfer level: Needs assistance Equipment used: Rolling walker (2 wheeled) Transfers: Sit to/from Stand Sit to Stand: Min assist         General transfer comment: cues for hand placement and LE management. Seemed to have some difficulty with processing commands at times.    Balance Overall balance assessment: Needs assistance Sitting-balance support: No upper extremity supported Sitting balance-Leahy Scale: Good     Standing balance support: Bilateral upper extremity supported Standing balance-Leahy Scale: Poor Standing balance comment: requiring use of rw                           ADL either performed or assessed with clinical judgement   ADL Overall ADL's : Needs assistance/impaired Eating/Feeding: Independent;Sitting   Grooming: Wash/dry hands;Set up;Sitting   Upper Body Bathing:  Set up;Sitting   Lower Body Bathing: Minimal assistance;Sit to/from stand   Upper Body Dressing : Set up;Sitting   Lower Body Dressing: Moderate assistance;Sit to/from stand   Toilet Transfer: Minimal assistance;Ambulation;RW;BSC   Toileting- Clothing Manipulation and Hygiene: Minimal assistance;Sit to/from stand         General ADL Comments: Educated on safety with functional transfers for 3in1. He needed increased time at times to process commands. Overall, mod cues for ADL tasks required today. Discussed at length need for initial supervision/assist to be available as he stated his brother can stay the first day but lives close by. With his difficulty following commands at times and overall assist needed, recommended to pt that he have his brother with him for at least the first week.      Vision Patient Visual Report: No change from baseline       Perception     Praxis      Pertinent Vitals/Pain Pain Assessment: 0-10 Pain Score: 3  Pain Location: Rt hip Pain Descriptors / Indicators: Aching Pain Intervention(s): Limited activity within patient's tolerance;Monitored during session     Hand Dominance     Extremity/Trunk Assessment Upper Extremity Assessment Upper Extremity Assessment: Overall WFL for tasks assessed          Communication Communication Communication: No difficulties   Cognition Arousal/Alertness: Awake/alert Behavior During Therapy: WFL for tasks assessed/performed Overall Cognitive Status: Within Functional Limits for tasks assessed  General Comments: At times seemed to be alittle slow to process commands and often referred back to things before surgery when trying to educate on how safely perform things now.    General Comments       Exercises     Shoulder Instructions      Home Living Family/patient expects to be discharged to:: Private residence Living Arrangements: Alone Available Help  at Discharge: Family;Available 24 hours/day Type of Home: House Home Access: Stairs to enter [employer] of Steps: 3 Entrance Stairs-Rails: Right;Left Home Layout: One level     Bathroom Shower/Tub: Walk-in shower         Home Equipment: Kasandra Knudsen - single point;Shower seat   Additional Comments: Brother will stay with pt for a couple days following D/C.       Prior Functioning/Environment Level of Independence: Independent                 OT Problem List: Decreased strength;Decreased knowledge of use of DME or AE      OT Treatment/Interventions: Self-care/ADL training;Patient/family education;Therapeutic activities;DME and/or AE instruction    OT Goals(Current goals can be found in the care plan section) Acute Rehab OT Goals Patient Stated Goal: home OT Goal Formulation: With patient Time For Goal Achievement: 03/30/17 Potential to Achieve Goals: Good  OT Frequency: Min 2X/week   Barriers to D/C:            Co-evaluation              End of Session Equipment Utilized During Treatment: Rolling walker  Activity Tolerance: Patient tolerated treatment well Patient left: in chair;with call bell/phone within reach  OT Visit Diagnosis: Unsteadiness on feet (R26.81);Muscle weakness (generalized) (M62.81)                Time: 6979-4801 OT Time Calculation (min): 28 min Charges:  OT General Charges $OT Visit: 1 Procedure OT Evaluation $OT Eval Low Complexity: 1 Procedure OT Treatments $Therapeutic Activity: 8-22 mins G-Codes:       Jae Dire Marybeth Dandy 03/23/2017, 1:05 PM  [phone]

## 2017-03-23 NOTE — Discharge Instructions (Signed)

## 2017-03-24 MED ORDER — ASPIRIN 81 MG PO CHEW: 81.0000 mg | CHEWABLE_TABLET | Freq: Two times a day (BID) | ORAL | 0 refills | Status: DC

## 2017-03-24 MED ORDER — METHOCARBAMOL 500 MG PO TABS
500.0000 mg | ORAL_TABLET | Freq: Four times a day (QID) | ORAL | 0 refills | Status: DC | PRN
Start: 2017-03-24 — End: 2017-08-08

## 2017-03-24 MED ORDER — OXYCODONE-ACETAMINOPHEN 5-325 MG PO TABS: 1.0000 | ORAL_TABLET | ORAL | 0 refills | Status: DC | PRN

## 2017-03-24 NOTE — Care Management Note (Signed)
Case Management Note  Patient Details  Name: Jose Kaufman MRN: [id number] Date of Birth: 1950/12/29  Subjective/Objective:      Right THa              Action/Plan: Discharge Planning: NCM spoke to pt. Pt was preoperatively arranged with [facility]. Requested RW and 3n1 bedside commode at home. Contacted [facility] for DME for home.   PCP Suella Grove S   Expected Discharge Date:  03/24/17               Expected Discharge Plan:  [place]  In-House Referral:  NA  Discharge planning Services  CM Consult  Post Acute Care Choice:  Home Health Choice offered to:  Patient  DME Arranged:  3-N-1, Walker rolling DME Agency:  [place]:  PT [place] Agency:  [facility] (formerly [facility]  Status of Service:  Completed, signed off  If discussed at [employer] of Stay Meetings, dates discussed:    Additional Comments:  Erenest Rasher, RN 03/24/2017, 1:36 PM

## 2017-03-24 NOTE — Progress Notes (Signed)
Subjective: 2 Days Post-Op Procedure(s) (LRB): RIGHT TOTAL HIP ARTHROPLASTY ANTERIOR APPROACH (Right) Patient reports pain as moderate.  Doing well with therapy.  Objective: Vital signs in last 24 hours: Temp:  [98.3 F (36.8 C)-99.8 F (37.7 C)] 98.6 F (37 C) (04/15 0505) Pulse Rate:  [72-93] 93 (04/15 0505) Resp:  [16] 16 (04/15 0505) BP: (108-143)/(64-68) 125/68 (04/15 0505) SpO2:  [98 %-100 %] 98 % (04/15 0505)  Intake/Output from previous day: 04/14 0701 - 04/15 0700 In: 460 [P.O.:240; I.V.:220] Out: 1525 [Urine:1525] Intake/Output this shift: Total I/O In: 120 [P.O.:120] Out: 150 [Urine:150]   Recent Labs  03/23/17 0446  HGB 10.9*    Recent Labs  03/23/17 0446  WBC 9.3  RBC 3.76*  HCT 33.2*  PLT 198    Recent Labs  03/23/17 0446  NA 135  K 4.2  CL 102  CO2 27  BUN 24*  CREATININE 1.37*  GLUCOSE 100*  CALCIUM 8.4*   No results for input(s): LABPT, INR in the last 72 hours.  Sensation intact distally Intact pulses distally Dorsiflexion/Plantar flexion intact Incision: dressing C/D/I  Assessment/Plan: 2 Days Post-Op Procedure(s) (LRB): RIGHT TOTAL HIP ARTHROPLASTY ANTERIOR APPROACH (Right) Up with therapy Discharge home with home health today.  Mcarthur Rossetti 03/24/2017, 12:29 PM

## 2017-03-24 NOTE — Progress Notes (Signed)
Nutrition Brief Note  Patient identified on the Malnutrition Screening Tool (MST) Report   Pt with insignificant weight loss. Pt currently eating 80-100% of meals at this time (each meal ranging between 300-500 kcal and 8-26g protein). Per orthopedics note, pt may discharge today.  Wt Readings from Last 15 Encounters:  03/22/17 169 lb (76.7 kg)  03/01/17 169 lb 9.6 oz (76.9 kg)  10/15/16 176 lb (79.8 kg)    Body mass index is 25.7 kg/m. Patient meets criteria for overweight based on current BMI.   Current diet order is regular, patient is consuming approximately 80-100% of meals at this time. Labs and medications reviewed.   No nutrition interventions warranted at this time. If nutrition issues arise, please consult RD.   Clayton Bibles, MS, RD, LDN Pager: [phone] After Hours Pager: [phone]

## 2017-03-24 NOTE — Discharge Summary (Signed)
Patient ID: Jose Kaufman MRN: [id number] DOB/AGE: 1951-06-06 66 y.o.  Admit date: 03/22/2017 Discharge date: 03/24/2017  Admission Diagnoses:  Principal Problem:   Unilateral primary osteoarthritis, right hip Active Problems:   Status post total replacement of right hip   Discharge Diagnoses:  Same  Past Medical History:  Diagnosis Date  . Abnormality of gait 10/15/2016  . Essential hypertension 03/14/2017  . PVCs (premature ventricular contractions) 03/14/2017    Surgeries: Procedure(s): RIGHT TOTAL HIP ARTHROPLASTY ANTERIOR APPROACH on 03/22/2017   Consultants:   Discharged Condition: Improved  Hospital Course: Jose Kaufman is an 66 y.o. male who was admitted 03/22/2017 for operative treatment ofUnilateral primary osteoarthritis, right hip. Patient has severe unremitting pain that affects sleep, daily activities, and work/hobbies. After pre-op clearance the patient was taken to the operating room on 03/22/2017 and underwent  Procedure(s): RIGHT TOTAL HIP ARTHROPLASTY ANTERIOR APPROACH.    Patient was given perioperative antibiotics: Anti-infectives    Start     Dose/Rate Route Frequency Ordered Stop   03/22/17 2130  clindamycin (CLEOCIN) IVPB 600 mg     600 mg 100 mL/hr over 30 Minutes Intravenous Every 6 hours 03/22/17 1739 03/23/17 0402   03/22/17 1302  clindamycin (CLEOCIN) 900 MG/50ML IVPB    Comments:  [place], Ron   : cabinet override      03/22/17 1302 03/22/17 1324   03/22/17 1158  clindamycin (CLEOCIN) IVPB 900 mg     900 mg 100 mL/hr over 30 Minutes Intravenous On call to O.R. 03/22/17 1158 03/22/17 1354       Patient was given sequential compression devices, early ambulation, and chemoprophylaxis to prevent DVT.  Patient benefited maximally from hospital stay and there were no complications.    Recent vital signs: Patient Vitals for the past 24 hrs:  BP Temp Temp src Pulse Resp SpO2  03/24/17 0505 125/68 98.6 F (37 C) Oral 93 16 98 %  03/23/17 2010  108/66 99.8 F (37.7 C) Oral 91 16 99 %  03/23/17 1500 (!) 143/64 98.3 F (36.8 C) Oral 72 16 100 %     Recent laboratory studies:  Recent Labs  03/23/17 0446  WBC 9.3  HGB 10.9*  HCT 33.2*  PLT 198  NA 135  K 4.2  CL 102  CO2 27  BUN 24*  CREATININE 1.37*  GLUCOSE 100*  CALCIUM 8.4*     Discharge Medications:   Allergies as of 03/24/2017      Reactions   Penicillins Other (See Comments)   Childhood reaction. Has patient had a PCN reaction causing immediate rash, facial/tongue/throat swelling, SOB or lightheadedness with hypotension: unsure Has patient had a PCN reaction causing severe rash involving mucus membranes or skin necrosis: unsure Has patient had a PCN reaction that required hospitalization:No Has patient had a PCN reaction occurring within the last 10 years:No If all of the above answers are "NO", then may proceed with Cephalosporin use      Medication List    TAKE these medications   acetaminophen 325 MG tablet Commonly known as:  TYLENOL Take 650 mg by mouth every 6 (six) hours as needed for mild pain.   aspirin 81 MG chewable tablet Chew 1 tablet (81 mg total) by mouth 2 (two) times daily.   FISH OIL PO Take 1 capsule by mouth every evening.   lisinopril-hydrochlorothiazide 20-12.5 MG tablet Commonly known as:  PRINZIDE,ZESTORETIC Take 1 tablet by mouth every evening.   methocarbamol 500 MG tablet Commonly known as:  ROBAXIN Take 1  tablet (500 mg total) by mouth every 6 (six) hours as needed for muscle spasms.   naproxen sodium 220 MG tablet Commonly known as:  ANAPROX Take 440 mg by mouth daily with lunch.   oxyCODONE-acetaminophen 5-325 MG tablet Commonly known as:  ROXICET Take 1-2 tablets by mouth every 4 (four) hours as needed.   silver sulfADIAZINE 1 % cream Commonly known as:  SILVADENE Apply 1 application topically daily. Applied topically to affected area of skin after bathing.   traMADol 50 MG tablet Commonly known as:   ULTRAM Take 50 mg by mouth 2 (two) times daily as needed for pain.            Durable Medical Equipment        Start     Ordered   03/22/17 1740  DME 3 n 1  Once     03/22/17 1739   03/22/17 1740  DME Walker rolling  Once    Question:  Patient needs a walker to treat with the following condition  Answer:  Status post total replacement of right hip   03/22/17 1739      Diagnostic Studies: Dg Pelvis Portable  Result Date: 03/22/2017 CLINICAL DATA:  66 y/o  M; status post total right hip replacement. EXAM: PORTABLE PELVIS 1-2 VIEWS COMPARISON:  None. FINDINGS: Total right hip arthroplasty. No apparent hardware related complication. Air and edema and surrounding soft tissues is compatible with postsurgical changes. No acute fracture or diastases of the pelvis. Moderate lower lumbar degenerative changes. Moderate osteoarthrosis of the left hip with superior loss of the joint space. Thickening of the left femoral head neck junction may represent CAM type acetabular impingement is a clinical setting. IMPRESSION: 1. Total right hip arthroplasty. No apparent hardware related complication. 2. Air and edema and surrounding soft tissues is compatible with postsurgical changes. 3. No acute fracture or diastases of the pelvis. 4. Moderate osteoarthrosis of the left hip with superior loss of the joint space. 5. Thickening of the left femoral head neck junction may represent CAM type acetabular impingement is a clinical setting. Electronically Signed   By: Kristine Garbe M.D.   On: 03/22/2017 20:14   Dg C-arm 1-60 Min-no Report  Result Date: 03/22/2017 Fluoroscopy was utilized by the requesting physician.  No radiographic interpretation.   Dg Hip Operative Unilat W Or W/o Pelvis Right  Result Date: 03/22/2017 CLINICAL DATA:  Right hip replaced EXAM: OPERATIVE RIGHT HIP WITH PELVIS COMPARISON:  None. FLUOROSCOPY TIME:  Radiation Exposure Index (as provided by the fluoroscopic device): Not  available If the device does not provide the exposure index: Fluoroscopy Time:  18 seconds Number of Acquired Images:  2 FINDINGS: Right hip replacement is noted in satisfactory position. No acute bony abnormality is seen. IMPRESSION: Right hip replacement Electronically Signed   By: Inez Catalina M.D.   On: 03/22/2017 15:49    Disposition: 01-Home or Self Care  Discharge Instructions    Discharge patient    Complete by:  As directed    Discharge disposition:  01-Home or Self Care   Discharge patient date:  03/24/2017      Follow-up Information    Mcarthur Rossetti, MD Follow up in 2 week(s).   Specialty:  Orthopedic Surgery Contact information: [name] [address] [phone]            Signed: Mcarthur Rossetti 03/24/2017, 12:35 PM

## 2017-03-24 NOTE — Progress Notes (Signed)
Upon d/c pt was was given instructions and prescriptions.  Understanding of the instructions was verbalized.  Pt was taken down in wheelchair.

## 2017-03-24 NOTE — Progress Notes (Signed)
Physical Therapy Treatment Patient Details Name: Jose Kaufman MRN: [id number] DOB: May 11, 1951 Today's Date: 03/24/2017    History of Present Illness 66 y.o. male s/p Rt anterior THA. PMH: PVCs, THN, abnormality of gait    PT Comments    Reinforcing safety with mobility during PT session including during gait, stairs, and sit/stand transfers. During ambulation the pt does continue to have shuffling pattern, worse with turns and close spaces. Stairs performed with min guard assistance. Reinforcing need for assistance at home in regard to safety with mobility. Pt verbalizes understanding.    Follow Up Recommendations  Home health PT;Supervision for mobility/OOB     Equipment Recommendations  Rolling walker with 5" wheels    Recommendations for Other Services       Precautions / Restrictions Precautions Precautions: Fall Precaution Comments: slow processing with incr fall risk Restrictions Weight Bearing Restrictions: Yes RLE Weight Bearing: Weight bearing as tolerated    Mobility  Bed Mobility Overal bed mobility: Needs Assistance Bed Mobility: Supine to Sit;Rolling Rolling: Min assist   Supine to sit: Mod assist     General bed mobility comments: up in chair upon arrival  Transfers Overall transfer level: Needs assistance Equipment used: Rolling walker (2 wheeled) Transfers: Sit to/from Stand Sit to Stand: Supervision         General transfer comment: reinforcing safety with sitting, full turns and using arms.   Ambulation/Gait Ambulation/Gait assistance: Supervision Ambulation Distance (Feet): 170 Feet Assistive device: Rolling walker (2 wheeled) Gait Pattern/deviations: Step-through pattern;Decreased stride length;Shuffle Gait velocity: decreased   General Gait Details: Shuffling pattern with turns and close spaces, increasing stride length in halls but continued decreased when cues not provided.    Stairs Stairs: Yes   Stair Management: Two  rails;Forwards;Step to pattern Number of Stairs: 5 General stair comments: cues for sequence  Wheelchair Mobility    Modified Rankin (Stroke Patients Only)       Balance Overall balance assessment: Needs assistance Sitting-balance support: Feet supported;No upper extremity supported Sitting balance-Leahy Scale: Good     Standing balance support: During functional activity Standing balance-Leahy Scale: Fair Standing balance comment: requiring use of rw during ambulation                            Cognition Arousal/Alertness: Awake/alert Behavior During Therapy: WFL for tasks assessed/performed Overall Cognitive Status: Within Functional Limits for tasks assessed                                 General Comments: needing repeated cues for task periodically      Exercises Total Joint Exercises Ankle Circles/Pumps: AROM;Both;20 reps Quad Sets: Strengthening;Both;10 reps Short Arc Quad: Strengthening;Right;10 reps Heel Slides: AROM;Right;10 reps Hip ABduction/ADduction: Strengthening;Right;10 reps Long Arc Quad: Strengthening;Right;10 reps    General Comments General comments (skin integrity, edema, etc.): educated on washing around incision site and demo during LB bathing this session      Pertinent Vitals/Pain Pain Assessment: No/denies pain Pain Score: 3  Pain Location: Rt hip Pain Descriptors / Indicators: Sore Pain Intervention(s): Monitored during session    Home Living                      Prior Function            PT Goals (current goals can now be found in the care plan section) Acute Rehab PT Goals  Patient Stated Goal: get some sleep PT Goal Formulation: With patient Time For Goal Achievement: 04/06/17 Potential to Achieve Goals: Good Progress towards PT goals: Progressing toward goals    Frequency    7X/week      PT Plan Current plan remains appropriate    Co-evaluation             End of Session  Equipment Utilized During Treatment: Gait belt Activity Tolerance: Patient tolerated treatment well Patient left: in chair;with call bell/phone within reach Nurse Communication: Mobility status PT Visit Diagnosis: Unsteadiness on feet (R26.81);Muscle weakness (generalized) (M62.81)     Time: 9150-5697 PT Time Calculation (min) (ACUTE ONLY): 29 min  Charges:  $Gait Training: 8-22 mins $Therapeutic Exercise: 8-22 mins                    G Codes:       Cassell Clement, PT, CSCS Pager [phone] Office [place] 03/24/2017, 11:05 AM

## 2017-03-24 NOTE — Progress Notes (Signed)
Occupational Therapy Treatment Patient Details Name: Jose Kaufman MRN: [id number] DOB: December 15, 1950 Today's Date: 03/24/2017    History of present illness 66 y.o. male s/p Rt anterior THA. PMH: PVCs, THN, abnormality of gait   OT comments  Pt will require brother (A) upon d/c to decr fall risk due to unsteadiness during basic transfers and adls. Advised not to shower at this time without (A) due to fall risk. Pt with trouble following sequence for shower transfer. Pt incontinent of bladder on arrival and needed cues for hygiene.    Follow Up Recommendations  No OT follow up;Supervision/Assistance - 24 hour    Equipment Recommendations  3 in 1 bedside commode    Recommendations for Other Services      Precautions / Restrictions Precautions Precautions: Fall Precaution Comments: slow processing with incr fall risk Restrictions Weight Bearing Restrictions: No RLE Weight Bearing: Weight bearing as tolerated       Mobility Bed Mobility Overal bed mobility: Needs Assistance Bed Mobility: Supine to Sit;Rolling Rolling: Min assist   Supine to sit: Mod assist     General bed mobility comments: pt unable to elevated trunk from bed surface without (A). pt exiting on R side. questions if pain rolling to R reason for (A)   Transfers Overall transfer level: Needs assistance Equipment used: Rolling walker (2 wheeled) Transfers: Sit to/from Stand Sit to Stand: Min guard         General transfer comment: cues for hand placement    Balance Overall balance assessment: Needs assistance Sitting-balance support: Bilateral upper extremity supported;Feet supported Sitting balance-Leahy Scale: Fair     Standing balance support: Bilateral upper extremity supported;During functional activity Standing balance-Leahy Scale: Poor                             ADL either performed or assessed with clinical judgement   ADL Overall ADL's : Needs assistance/impaired      Grooming: Wash/dry hands;Wash/dry face;Min guard   Upper Body Bathing: Minimal assistance;Standing   Lower Body Bathing: Moderate assistance;Sit to/from stand   Upper Body Dressing : Min guard       Toilet Transfer: Minimal assistance;RW;BSC;Grab bars Toilet Transfer Details (indicate cue type and reason): cues for safety and sequence   Toileting - Clothing Manipulation Details (indicate cue type and reason): inability void but express need to void bowels Tub/ Shower Transfer: Moderate assistance Tub/Shower Transfer Details (indicate cue type and reason): pt with poor sequence and return demo. Pt needed max cues throughout. pt reversing R and L LE commands. Pt advised to sponge bath at this time. Pt will have younger brother (A) upon d/c Functional mobility during ADLs: Minimal assistance;Rolling walker General ADL Comments: Pt unsteady with RW and seems slightly anxious with movement. pt taking several sequence steps in RW smoothly then pauses and has unsteady moment as if he forgot the sequence   Pt educated on bathing and avoid washing directly on incision. Pt educated to use new wash cloth and towel each day. Pt educated to allow water to run across dressing and not to soak in a tub at this time.     Vision       Perception     Praxis      Cognition Arousal/Alertness: Awake/alert Behavior During Therapy: WFL for tasks assessed/performed Overall Cognitive Status: No family/caregiver present to determine baseline cognitive functioning  General Comments: pt needs repetitive commands, pt reverses commands R instead of L questions baseline. pt becomes a little anxious with movement. pt demonstrates decr ability to follow two step command. pt with urine on his gown and needed cues from therapist to change soiled linen        Exercises     Shoulder Instructions       General Comments educated on washing around incision site and  demo during LB bathing this session    Pertinent Vitals/ Pain       Pain Assessment: 0-10 Pain Score: 3  Pain Location: Rt hip Pain Descriptors / Indicators: Sore Pain Intervention(s): Monitored during session;Premedicated before session;Repositioned  Home Living                                          Prior Functioning/Environment              Frequency  Min 2X/week        Progress Toward Goals  OT Goals(current goals can now be found in the care plan section)  Progress towards OT goals: Progressing toward goals  Acute Rehab OT Goals Patient Stated Goal: home OT Goal Formulation: With patient Time For Goal Achievement: 03/30/17 Potential to Achieve Goals: Good ADL Goals Pt Will Perform Lower Body Dressing: with min guard assist;sit to/from stand Pt Will Transfer to Toilet: with min guard assist;bedside commode;ambulating Pt Will Perform Toileting - Clothing Manipulation and hygiene: with min guard assist;sit to/from stand Pt Will Perform Tub/Shower Transfer: Shower transfer;with min guard assist;rolling walker  Plan Discharge plan remains appropriate    Co-evaluation                 End of Session Equipment Utilized During Treatment: Rolling walker  OT Visit Diagnosis: Unsteadiness on feet (R26.81);Muscle weakness (generalized) (M62.81)   Activity Tolerance Patient tolerated treatment well   Patient Left in chair;with call bell/phone within reach   Nurse Communication Mobility status;Precautions        Time: 7001-7494 OT Time Calculation (min): 27 min  Charges: OT General Charges $OT Visit: 1 Procedure OT Treatments $Self Care/Home Management : 23-37 mins   Jeri Modena   OTR/L Pager: [phone] Office: [phone] .    Parke Poisson B 03/24/2017, 10:12 AM

## 2017-03-25 ENCOUNTER — Encounter (HOSPITAL_COMMUNITY): Payer: Self-pay | Admitting: Orthopaedic Surgery

## 2017-03-26 ENCOUNTER — Telehealth (INDEPENDENT_AMBULATORY_CARE_PROVIDER_SITE_OTHER): Payer: Self-pay | Admitting: Orthopaedic Surgery

## 2017-03-26 NOTE — Telephone Encounter (Signed)
Phys therapist requests verbal for pt for 3x wk for 2 wks.   Gracie with [facility] [phone]

## 2017-03-26 NOTE — Telephone Encounter (Signed)
Verbal orders left on VM 

## 2017-04-04 ENCOUNTER — Ambulatory Visit (INDEPENDENT_AMBULATORY_CARE_PROVIDER_SITE_OTHER): Payer: BLUE CROSS/BLUE SHIELD | Admitting: Orthopaedic Surgery

## 2017-04-04 ENCOUNTER — Encounter (INDEPENDENT_AMBULATORY_CARE_PROVIDER_SITE_OTHER): Payer: Self-pay

## 2017-04-04 DIAGNOSIS — Z96641 Presence of right artificial hip joint: Secondary | ICD-10-CM

## 2017-04-04 NOTE — Progress Notes (Signed)
The patient is now 2 weeks tomorrow status post a right total hip arthroplasty. He is doing well. He has 1 more home health physical therapy visit note releasing him. He's anxious to go ahead drive. He is not taking any narcotics. He is on aspirin twice a day. Family with a cane.  Easily tolerance with his right hip through motion. He has only minimal seroma at his incision. His incision looks good. I placed new Steri-Strips. His leg lengths are equal.  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 He can drop from my standpoint as well. We'll see him back in a month to see how is doing overall but no x-rays are needed.

## 2017-04-05 ENCOUNTER — Telehealth (INDEPENDENT_AMBULATORY_CARE_PROVIDER_SITE_OTHER): Payer: Self-pay

## 2017-04-05 NOTE — Telephone Encounter (Signed)
FYI-  Faxed patient work note to [phone], attention [employer].

## 2017-05-02 ENCOUNTER — Ambulatory Visit (INDEPENDENT_AMBULATORY_CARE_PROVIDER_SITE_OTHER): Payer: BLUE CROSS/BLUE SHIELD | Admitting: Orthopaedic Surgery

## 2017-05-02 ENCOUNTER — Encounter (INDEPENDENT_AMBULATORY_CARE_PROVIDER_SITE_OTHER): Payer: Self-pay

## 2017-05-02 ENCOUNTER — Telehealth (INDEPENDENT_AMBULATORY_CARE_PROVIDER_SITE_OTHER): Payer: Self-pay | Admitting: *Deleted

## 2017-05-02 DIAGNOSIS — M25552 Pain in left hip: Secondary | ICD-10-CM

## 2017-05-02 DIAGNOSIS — M1612 Unilateral primary osteoarthritis, left hip: Secondary | ICD-10-CM

## 2017-05-02 DIAGNOSIS — Z96641 Presence of right artificial hip joint: Secondary | ICD-10-CM

## 2017-05-02 NOTE — Telephone Encounter (Signed)
Just spoke with patient and he said he thinks they are going to prescribe this for him. I told him to call us back if not and we will call this in for him up until 3 mo after surgery

## 2017-05-02 NOTE — Progress Notes (Signed)
Patient is here for follow-up 6 weeks after a right total hip arthroplasty. He said that right hip is doing well but his left hip is giving her a lot of problems. We've reviewed his x-rays together again today of his left hip that shows severe end-stage arthritis of left hip. There is sclerotic changes at the superior lateral joint space there is cystic changes in the acetabulum, and there is complete loss of the superior lateral joint space with evidence also femoral acetabular impingement.  On examination he has severe pain with internal rotation rotation of his left hip is quite stiff. His right hip is now moving much more fluidly.  He does feel like he needs to proceed with a left total hip arthroplasty sometime around September of this year which I agree with. Is now detriment affecting her activities daily living, his quality of life and his mobility. I'll see him back sometime around the end of July portal setting up for his September surgery. All questions were encouraged and answered.

## 2017-05-02 NOTE — Telephone Encounter (Signed)
Patient came in this morning in regards to needing to be on antibiotics because he is scheduled to have some dental work soon. He recently had a hip replacement 6 weeks ago. Her dentist is Dr. Orene Desanctis at [facility] [address]. His CB # [phone]. Thank you

## 2017-05-07 ENCOUNTER — Other Ambulatory Visit (INDEPENDENT_AMBULATORY_CARE_PROVIDER_SITE_OTHER): Payer: Self-pay

## 2017-05-07 ENCOUNTER — Telehealth (INDEPENDENT_AMBULATORY_CARE_PROVIDER_SITE_OTHER): Payer: Self-pay | Admitting: Orthopaedic Surgery

## 2017-05-07 MED ORDER — CLINDAMYCIN HCL 300 MG PO CAPS: ORAL_CAPSULE | ORAL | 0 refills | Status: DC

## 2017-05-07 NOTE — Telephone Encounter (Signed)
Rx called into his pharmacy for antibiotics before dentist

## 2017-05-07 NOTE — Telephone Encounter (Signed)
Patient called asking for a letter to be faxed over to his dentist office. He has an appointment to get his teeth cleaned and they need the okay since he had hip replacement surgery a little over a month ago. Thank you. Dr Orene Desanctis fax # [phone]

## 2017-07-02 ENCOUNTER — Ambulatory Visit (INDEPENDENT_AMBULATORY_CARE_PROVIDER_SITE_OTHER): Payer: BLUE CROSS/BLUE SHIELD | Admitting: Orthopaedic Surgery

## 2017-07-02 DIAGNOSIS — M1612 Unilateral primary osteoarthritis, left hip: Secondary | ICD-10-CM

## 2017-07-02 DIAGNOSIS — Z96641 Presence of right artificial hip joint: Secondary | ICD-10-CM

## 2017-07-02 NOTE — Progress Notes (Signed)
The patient is now well over 3 months status post a right total hip arthroplasty. He says that hip is doing fantastic. He has known severe osteoporosis 0 disease of his left hip. We went over x-rays again and show loss of superior lateral joint space was chronic changes and cystic changes. He has femoral acetabular impingement as well. He said his pain is daily on his left hip and now it is affecting his balance negatively. It's also detrimentally affected his activities daily living, his quality of life, and his mobility. At this point he would like to proceed with a left total hip arthroplasty sometime in September. We long and thorough discussion about this and all questions were encouraged and answered. On examination his right hip moves smoothly with no evidence of impingement. His leg lengths are equal. His right hip has severe pain with internal rotation rotation as well as significant limits with internal/external rotation. I'm happy to proceed with surgery on him in September. He said he can call our surgery scheduler to work on getting this set up. We then see him back in 2 weeks postoperative but no x-rays are needed.

## 2017-07-31 ENCOUNTER — Other Ambulatory Visit (INDEPENDENT_AMBULATORY_CARE_PROVIDER_SITE_OTHER): Payer: Self-pay

## 2017-08-01 ENCOUNTER — Other Ambulatory Visit (INDEPENDENT_AMBULATORY_CARE_PROVIDER_SITE_OTHER): Payer: Self-pay | Admitting: Physician Assistant

## 2017-08-02 NOTE — Patient Instructions (Signed)
Jose Kaufman  08/02/2017   Your procedure is scheduled on: 08/23/17  Report to [facility] Main  Entrance   Report to admitting at    0700 AM Follow signs to Short Stay on first floor at AM  Call this number if you have problems the morning of surgery  [phone]   Remember: ONLY 1 PERSON MAY GO WITH YOU TO SHORT STAY TO GET  READY MORNING OF [place].  Do not eat food or drink liquids :After Midnight.     Take these medicines the morning of surgery with A SIP OF WATER: NONE                                You may not have any metal on your body including hair pins and              piercings  Do not wear jewelry, , lotions, powders or perfumes, deodorant                        Men may shave face and neck.   Do not bring valuables to the hospital. [place].  Contacts, dentures or bridgework may not be worn into surgery.  Leave suitcase in the car. After surgery it may be brought to your room.              Please read over the following fact sheets you were given: _____________________________________________________________________           [facility] - Preparing for Surgery Before surgery, you can play an important role.  Because skin is not sterile, your skin needs to be as free of germs as possible.  You can reduce the number of germs on your skin by washing with CHG (chlorahexidine gluconate) soap before surgery.  CHG is an antiseptic cleaner which kills germs and bonds with the skin to continue killing germs even after washing. Please DO NOT use if you have an allergy to CHG or antibacterial soaps.  If your skin becomes reddened/irritated stop using the CHG and inform your nurse when you arrive at Short Stay. Do not shave (including legs and underarms) for at least 48 hours prior to the first CHG shower.  You may shave your face/neck. Please follow these instructions carefully:  1.  Shower with  CHG Soap the night before surgery and the  morning of Surgery.  2.  If you choose to wash your hair, wash your hair first as usual with your  normal  shampoo.  3.  After you shampoo, rinse your hair and body thoroughly to remove the  shampoo.                           4.  Use CHG as you would any other liquid soap.  You can apply chg directly  to the skin and wash                       Gently with a scrungie or clean washcloth.  5.  Apply the CHG Soap to your body ONLY FROM THE NECK DOWN.   Do not use on face/ open  Wound or open sores. Avoid contact with eyes, ears mouth and genitals (private parts).                       Wash face,  Genitals (private parts) with your normal soap.             6.  Wash thoroughly, paying special attention to the area where your surgery  will be performed.  7.  Thoroughly rinse your body with warm water from the neck down.  8.  DO NOT shower/wash with your normal soap after using and rinsing off  the CHG Soap.                9.  Pat yourself dry with a clean towel.            10.  Wear clean pajamas.            11.  Place clean sheets on your bed the night of your first shower and do not  sleep with pets. Day of Surgery : Do not apply any lotions/deodorants the morning of surgery.  Please wear clean clothes to the hospital/surgery center.  FAILURE TO FOLLOW THESE INSTRUCTIONS MAY RESULT IN THE CANCELLATION OF YOUR SURGERY PATIENT SIGNATURE_________________________________  NURSE SIGNATURE__________________________________  ________________________________________________________________________  WHAT IS A BLOOD TRANSFUSION? Blood Transfusion Information  A transfusion is the replacement of blood or some of its parts. Blood is made up of multiple cells which provide different functions.  Red blood cells carry oxygen and are used for blood loss replacement.  White blood cells fight against infection.  Platelets control  bleeding.  Plasma helps clot blood.  Other blood products are available for specialized needs, such as hemophilia or other clotting disorders. BEFORE THE TRANSFUSION  Who gives blood for transfusions?   Healthy volunteers who are fully evaluated to make sure their blood is safe. This is blood bank blood. Transfusion therapy is the safest it has ever been in the practice of medicine. Before blood is taken from a donor, a complete history is taken to make sure that person has no history of diseases nor engages in risky social behavior (examples are intravenous drug use or sexual activity with multiple partners). The donor's travel history is screened to minimize risk of transmitting infections, such as malaria. The donated blood is tested for signs of infectious diseases, such as HIV and hepatitis. The blood is then tested to be sure it is compatible with you in order to minimize the chance of a transfusion reaction. If you or a relative donates blood, this is often done in anticipation of surgery and is not appropriate for emergency situations. It takes many days to process the donated blood. RISKS AND COMPLICATIONS Although transfusion therapy is very safe and saves many lives, the main dangers of transfusion include:   Getting an infectious disease.  Developing a transfusion reaction. This is an allergic reaction to something in the blood you were given. Every precaution is taken to prevent this. The decision to have a blood transfusion has been considered carefully by your caregiver before blood is given. Blood is not given unless the benefits outweigh the risks. AFTER THE TRANSFUSION  Right after receiving a blood transfusion, you will usually feel much better and more energetic. This is especially true if your red blood cells have gotten low (anemic). The transfusion raises the level of the red blood cells which carry oxygen, and this usually causes an energy increase.  The  nurse  administering the transfusion will monitor you carefully for complications. HOME CARE INSTRUCTIONS  No special instructions are needed after a transfusion. You may find your energy is better. Speak with your caregiver about any limitations on activity for underlying diseases you may have. SEEK MEDICAL CARE IF:   Your condition is not improving after your transfusion.  You develop redness or irritation at the intravenous (IV) site. SEEK IMMEDIATE MEDICAL CARE IF:  Any of the following symptoms occur over the next 12 hours:  Shaking chills.  You have a temperature by mouth above 102 F (38.9 C), not controlled by medicine.  Chest, back, or muscle pain.  People around you feel you are not acting correctly or are confused.  Shortness of breath or difficulty breathing.  Dizziness and fainting.  You get a rash or develop hives.  You have a decrease in urine output.  Your urine turns a dark color or changes to pink, red, or brown. Any of the following symptoms occur over the next 10 days:  You have a temperature by mouth above 102 F (38.9 C), not controlled by medicine.  Shortness of breath.  Weakness after normal activity.  The white part of the eye turns yellow (jaundice).  You have a decrease in the amount of urine or are urinating less often.  Your urine turns a dark color or changes to pink, red, or brown. Document Released: 11/23/2000 Document Revised: 02/18/2012 Document Reviewed: 07/12/2008 ExitCare Patient Information 2014 [place].  _______________________________________________________________________  Incentive Spirometer  An incentive spirometer is a tool that can help keep your lungs clear and active. This tool measures how well you are filling your lungs with each breath. Taking long deep breaths may help reverse or decrease the chance of developing breathing (pulmonary) problems (especially infection) following:  A long period of time when you are  unable to move or be active. BEFORE THE PROCEDURE   If the spirometer includes an indicator to show your best effort, your nurse or respiratory therapist will set it to a desired goal.  If possible, sit up straight or lean slightly forward. Try not to slouch.  Hold the incentive spirometer in an upright position. INSTRUCTIONS FOR USE  1. Sit on the edge of your bed if possible, or sit up as far as you can in bed or on a chair. 2. Hold the incentive spirometer in an upright position. 3. Breathe out normally. 4. Place the mouthpiece in your mouth and seal your lips tightly around it. 5. Breathe in slowly and as deeply as possible, raising the piston or the ball toward the top of the column. 6. Hold your breath for 3-5 seconds or for as long as possible. Allow the piston or ball to fall to the bottom of the column. 7. Remove the mouthpiece from your mouth and breathe out normally. 8. Rest for a few seconds and repeat Steps 1 through 7 at least 10 times every 1-2 hours when you are awake. Take your time and take a few normal breaths between deep breaths. 9. The spirometer may include an indicator to show your best effort. Use the indicator as a goal to work toward during each repetition. 10. After each set of 10 deep breaths, practice coughing to be sure your lungs are clear. If you have an incision (the cut made at the time of surgery), support your incision when coughing by placing a pillow or rolled up towels firmly against it. Once you are able to get  out of bed, walk around indoors and cough well. You may stop using the incentive spirometer when instructed by your caregiver.  RISKS AND COMPLICATIONS  Take your time so you do not get dizzy or light-headed.  If you are in pain, you may need to take or ask for pain medication before doing incentive spirometry. It is harder to take a deep breath if you are having pain. AFTER USE  Rest and breathe slowly and easily.  It can be helpful to  keep track of a log of your progress. Your caregiver can provide you with a simple table to help with this. If you are using the spirometer at home, follow these instructions: [place] IF:   You are having difficultly using the spirometer.  You have trouble using the spirometer as often as instructed.  Your pain medication is not giving enough relief while using the spirometer.  You develop fever of 100.5 F (38.1 C) or higher. SEEK IMMEDIATE MEDICAL CARE IF:   You cough up bloody sputum that had not been present before.  You develop fever of 102 F (38.9 C) or greater.  You develop worsening pain at or near the incision site. MAKE SURE YOU:   Understand these instructions.  Will watch your condition.  Will get help right away if you are not doing well or get worse. Document Released: 04/08/2007 Document Revised: 02/18/2012 Document Reviewed: 06/09/2007 [facility] Patient Information 2014 [place], [place].   ________________________________________________________________________

## 2017-08-09 ENCOUNTER — Encounter (HOSPITAL_COMMUNITY)
Admission: RE | Admit: 2017-08-09 | Discharge: 2017-08-09 | Disposition: A | Discharge status: Home / Self Care | Payer: BLUE CROSS/BLUE SHIELD | Source: Ambulatory Visit | Attending: Orthopaedic Surgery | Admitting: Orthopaedic Surgery

## 2017-08-09 ENCOUNTER — Encounter (HOSPITAL_COMMUNITY): Payer: Self-pay

## 2017-08-09 ENCOUNTER — Encounter (INDEPENDENT_AMBULATORY_CARE_PROVIDER_SITE_OTHER): Payer: Self-pay

## 2017-08-09 DIAGNOSIS — Z01812 Encounter for preprocedural laboratory examination: Secondary | ICD-10-CM | POA: Diagnosis not present

## 2017-08-09 DIAGNOSIS — M1612 Unilateral primary osteoarthritis, left hip: Secondary | ICD-10-CM | POA: Diagnosis not present

## 2017-08-09 HISTORY — DX: Unspecified osteoarthritis, unspecified site: M19.90

## 2017-08-09 HISTORY — DX: Cardiac arrhythmia, unspecified: I49.9

## 2017-08-09 LAB — BASIC METABOLIC PANEL
ANION GAP: 8 (ref 5–15)
BUN: 24 mg/dL — ABNORMAL HIGH (ref 6–20)
CHLORIDE: 103 mmol/L (ref 101–111)
CO2: 25 mmol/L (ref 22–32)
Calcium: 9.1 mg/dL (ref 8.9–10.3)
Creatinine, Ser: 1.23 mg/dL (ref 0.61–1.24)
Glucose, Bld: 101 mg/dL — ABNORMAL HIGH (ref 65–99)
Potassium: 4 mmol/L (ref 3.5–5.1)
SODIUM: 136 mmol/L (ref 135–145)

## 2017-08-09 LAB — CBC
HEMATOCRIT: 44.6 % (ref 39.0–52.0)
Hemoglobin: 15.1 g/dL (ref 13.0–17.0)
MCH: 30.3 pg (ref 26.0–34.0)
MCHC: 33.9 g/dL (ref 30.0–36.0)
MCV: 89.4 fL (ref 78.0–100.0)
Platelets: 255 10*3/uL (ref 150–400)
RBC: 4.99 MIL/uL (ref 4.22–5.81)
RDW: 13.1 % (ref 11.5–15.5)
WBC: 8.5 10*3/uL (ref 4.0–10.5)

## 2017-08-09 LAB — SURGICAL PCR SCREEN
MRSA, PCR: NEGATIVE
STAPHYLOCOCCUS AUREUS: POSITIVE — AB

## 2017-08-15 NOTE — Progress Notes (Signed)
lov and clearance Dr. Wynonia Lawman 03/14/17 chart Echo 03/18/17 chart Jose Kaufman

## 2017-08-22 NOTE — Anesthesia Preprocedure Evaluation (Addendum)
Anesthesia Evaluation  Patient identified by MRN, date of birth, ID band Patient awake    Reviewed: Allergy & Precautions, NPO status , Patient's Chart, lab work & pertinent test results  History of Anesthesia Complications (+) DIFFICULT IV STICK / SPECIAL LINE  Airway Mallampati: I  TM Distance: >3 FB Neck ROM: Full    Dental no notable dental hx.    Pulmonary neg pulmonary ROS, former smoker,    Pulmonary exam normal breath sounds clear to auscultation       Cardiovascular hypertension, negative cardio ROS Normal cardiovascular exam Rhythm:Regular Rate:Normal     Neuro/Psych negative neurological ROS  negative psych ROS   GI/Hepatic negative GI ROS, Neg liver ROS,   Endo/Other  negative endocrine ROS  Renal/GU negative Renal ROS  negative genitourinary   Musculoskeletal negative musculoskeletal ROS (+) Arthritis , Osteoarthritis,    Abdominal   Peds negative pediatric ROS (+)  Hematology negative hematology ROS (+)   Anesthesia Other Findings   Reproductive/Obstetrics negative OB ROS                            Anesthesia Physical  Anesthesia Plan  ASA: II  Anesthesia Plan: Spinal   Post-op Pain Management:  Regional for Post-op pain   Induction: Intravenous  PONV Risk Score and Plan: 2 and Ondansetron, Dexamethasone, Treatment may vary due to age or medical condition, Midazolam and Propofol infusion  Airway Management Planned: Simple Face Mask, Nasal Cannula and Natural Airway  Additional Equipment:   Intra-op Plan:   Post-operative Plan:   Informed Consent: I have reviewed the patients History and Physical, chart, labs and discussed the procedure including the risks, benefits and alternatives for the proposed anesthesia with the patient or authorized representative who has indicated his/her understanding and acceptance.   Dental advisory given  Plan Discussed with:  CRNA and Anesthesiologist  Anesthesia Plan Comments:        Anesthesia Quick Evaluation

## 2017-08-23 ENCOUNTER — Inpatient Hospital Stay (HOSPITAL_COMMUNITY): Payer: BLUE CROSS/BLUE SHIELD

## 2017-08-23 ENCOUNTER — Encounter (HOSPITAL_COMMUNITY): Payer: Self-pay

## 2017-08-23 ENCOUNTER — Inpatient Hospital Stay (HOSPITAL_COMMUNITY)
Admission: RE | Admit: 2017-08-23 | Discharge: 2017-08-25 | DRG: 470 | Disposition: A | Discharge status: Home / Self Care | Payer: BLUE CROSS/BLUE SHIELD | Source: Ambulatory Visit | Attending: Orthopaedic Surgery | Admitting: Orthopaedic Surgery

## 2017-08-23 ENCOUNTER — Inpatient Hospital Stay (HOSPITAL_COMMUNITY): Payer: BLUE CROSS/BLUE SHIELD | Admitting: Anesthesiology

## 2017-08-23 ENCOUNTER — Encounter (HOSPITAL_COMMUNITY)
Admission: RE | Disposition: A | Discharge status: Home / Self Care | Payer: Self-pay | Source: Ambulatory Visit | Attending: Orthopaedic Surgery

## 2017-08-23 DIAGNOSIS — Z96641 Presence of right artificial hip joint: Secondary | ICD-10-CM | POA: Diagnosis present

## 2017-08-23 DIAGNOSIS — Z96642 Presence of left artificial hip joint: Secondary | ICD-10-CM

## 2017-08-23 DIAGNOSIS — Z79899 Other long term (current) drug therapy: Secondary | ICD-10-CM

## 2017-08-23 DIAGNOSIS — Z87891 Personal history of nicotine dependence: Secondary | ICD-10-CM | POA: Diagnosis not present

## 2017-08-23 DIAGNOSIS — Z419 Encounter for procedure for purposes other than remedying health state, unspecified: Secondary | ICD-10-CM

## 2017-08-23 DIAGNOSIS — Z791 Long term (current) use of non-steroidal anti-inflammatories (NSAID): Secondary | ICD-10-CM | POA: Diagnosis not present

## 2017-08-23 DIAGNOSIS — Z880 Allergy status to penicillin: Secondary | ICD-10-CM | POA: Diagnosis not present

## 2017-08-23 DIAGNOSIS — I10 Essential (primary) hypertension: Secondary | ICD-10-CM | POA: Diagnosis present

## 2017-08-23 DIAGNOSIS — M1612 Unilateral primary osteoarthritis, left hip: Secondary | ICD-10-CM | POA: Diagnosis present

## 2017-08-23 HISTORY — PX: TOTAL HIP ARTHROPLASTY: SHX124

## 2017-08-23 LAB — TYPE AND SCREEN
ABO/RH(D): O POS
Antibody Screen: NEGATIVE

## 2017-08-23 IMAGING — DX DG PORTABLE PELVIS
1 series · 1 of 1 positions shown · non-contrast
Comparison: [DATE].

CLINICAL DATA: Left hip replacement .

EXAM:
PORTABLE PELVIS 1-2 VIEWS

[pelvis ap]
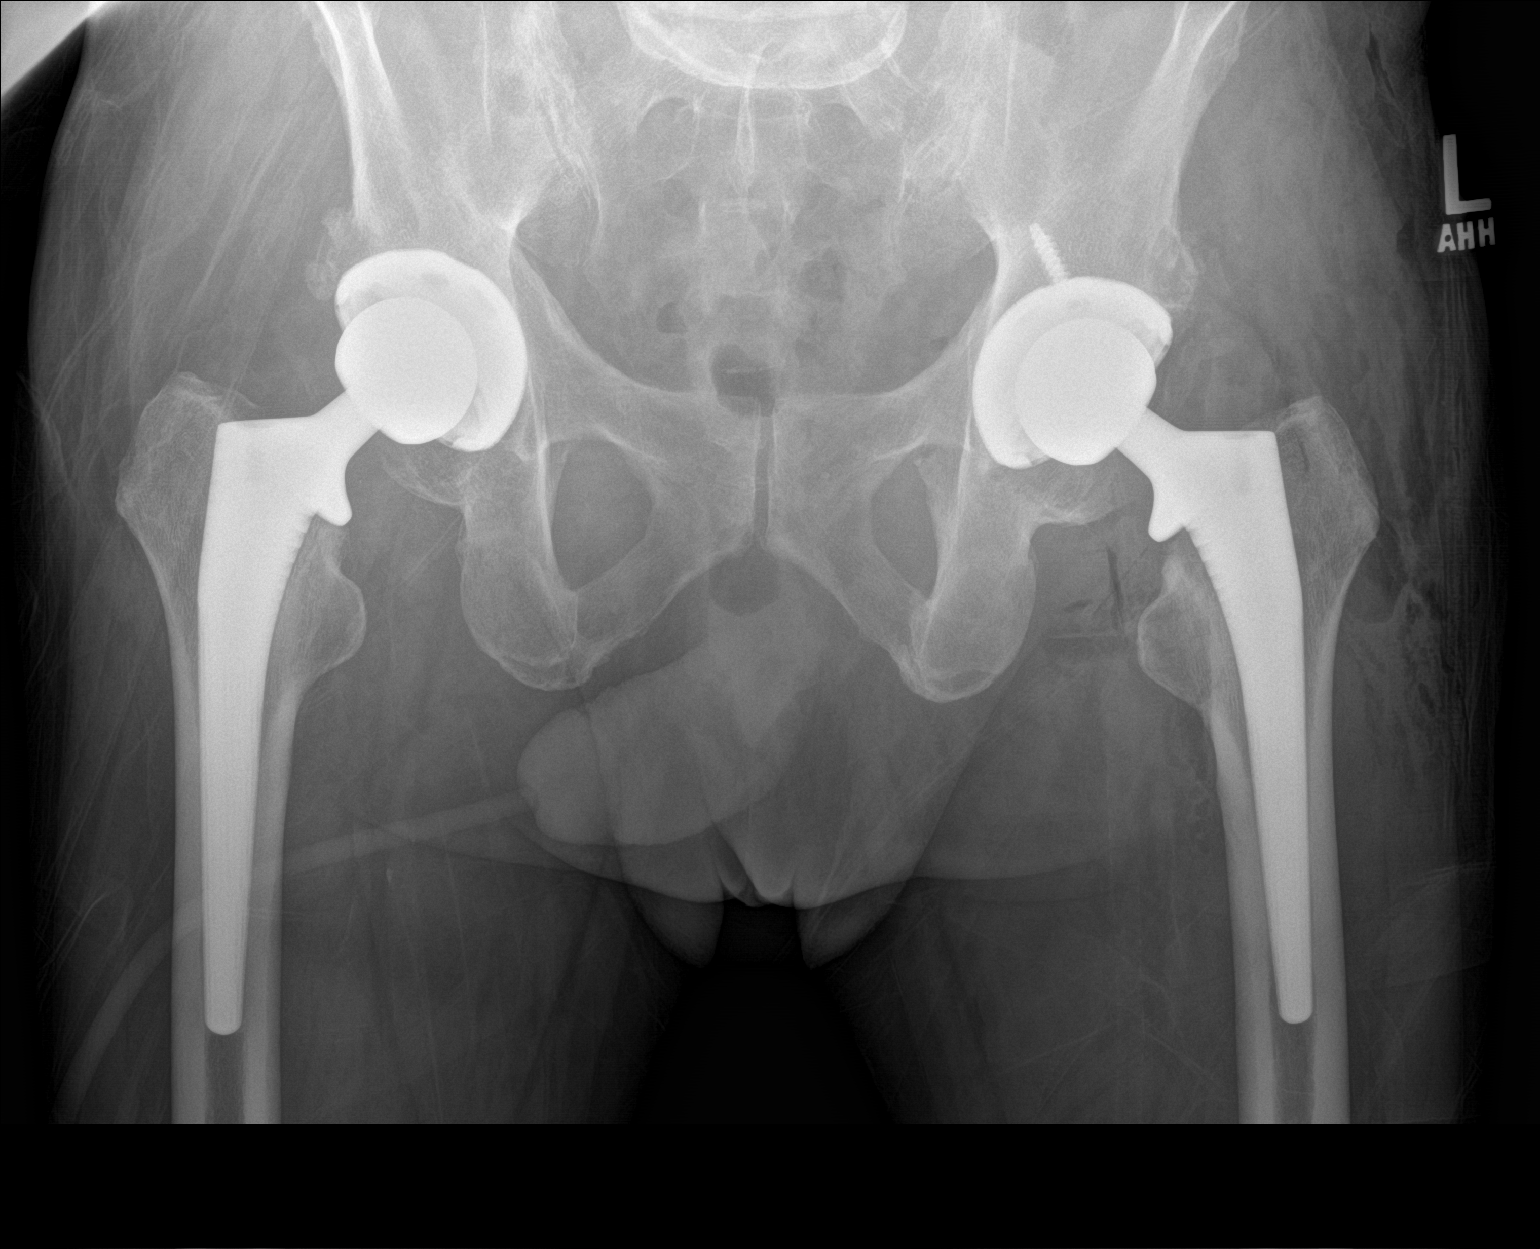

[1 of 1 positions shown; findings below may reference images not displayed]

FINDINGS: Total left hip replacement. Hardware intact. Anatomic alignment.
Prior right hip replacement . Hardware intact. No acute bony
abnormality identified. No evidence of fracture or dislocation.
Foley catheter noted.
IMPRESSION: Total left hip replacement with anatomic alignment.

## 2017-08-23 IMAGING — RF DG HIP (WITH PELVIS) OPERATIVE*L*
1 series · 5 of 5 positions shown · non-contrast
Comparison: [DATE]

CLINICAL DATA: Intraoperative left anterior hip replacement

EXAM:
OPERATIVE LEFT HIP (WITH PELVIS IF PERFORMED)  VIEWS
TECHNIQUE: Fluoroscopic spot image(s) were submitted for interpretation
post-operatively.

[Series 1: run · 5 of 5 slices shown]
[im 1/5]
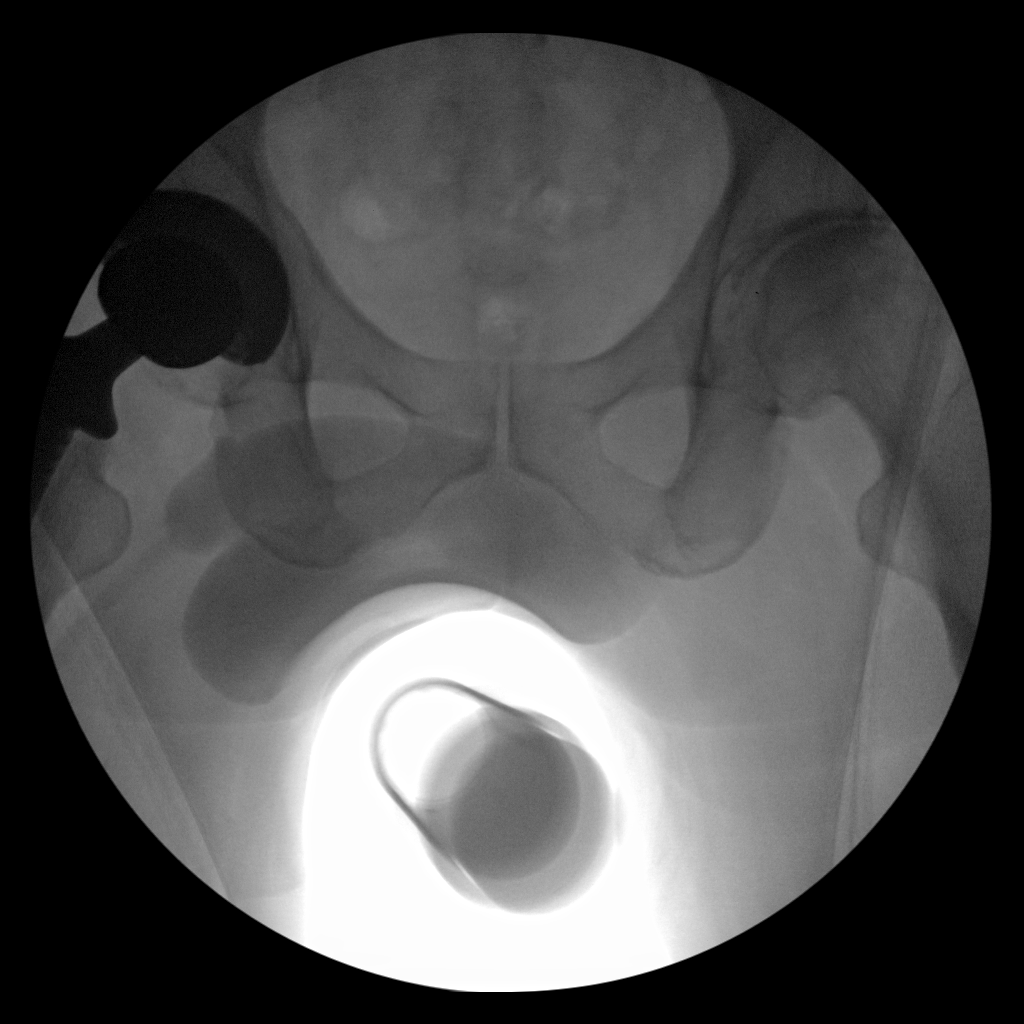
[im 2/5]
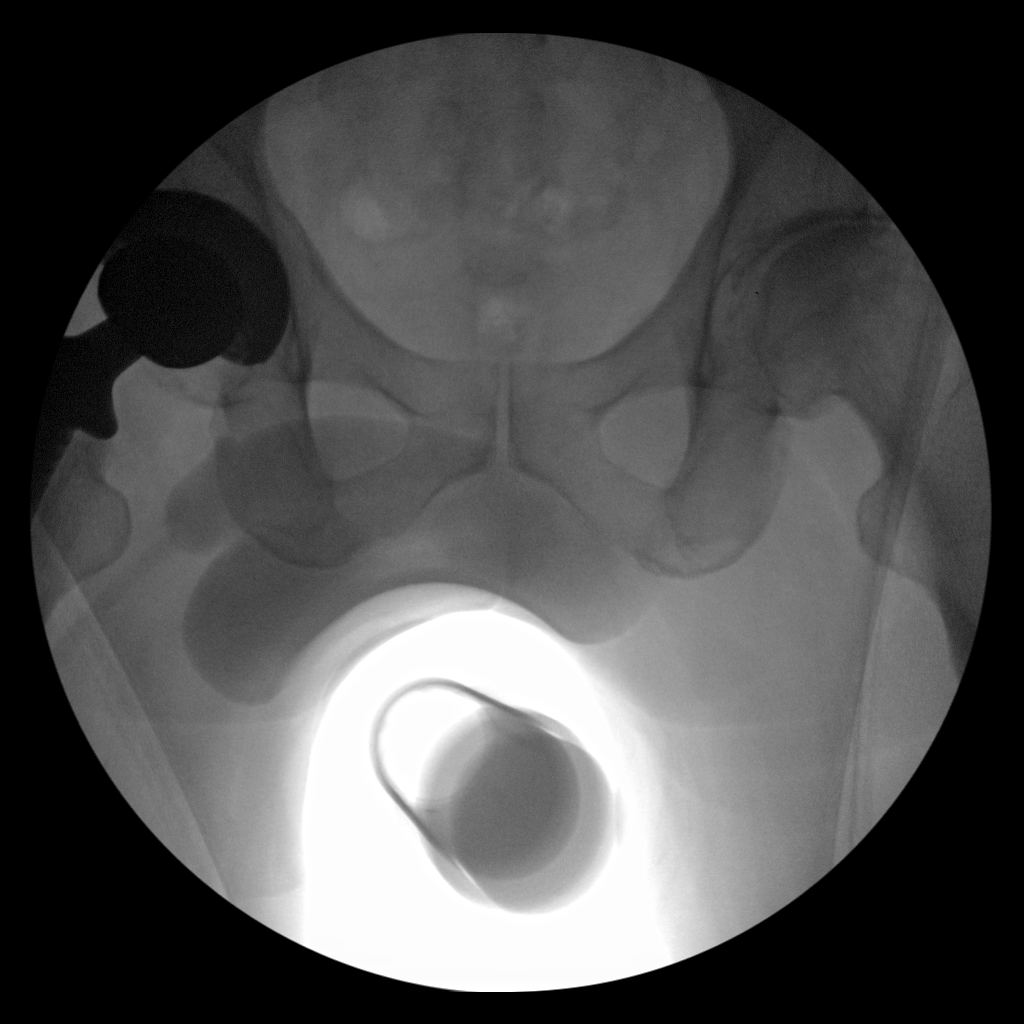
[im 3/5]
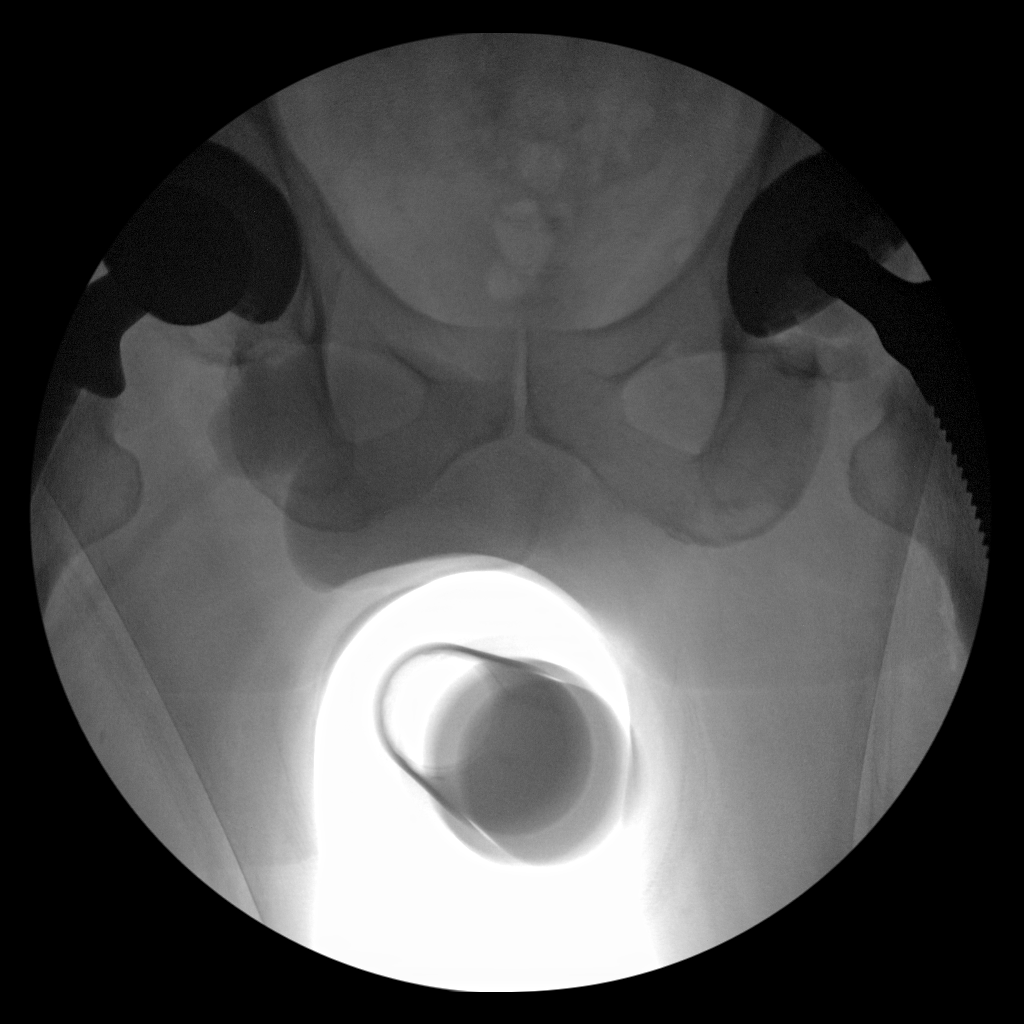
[im 4/5]
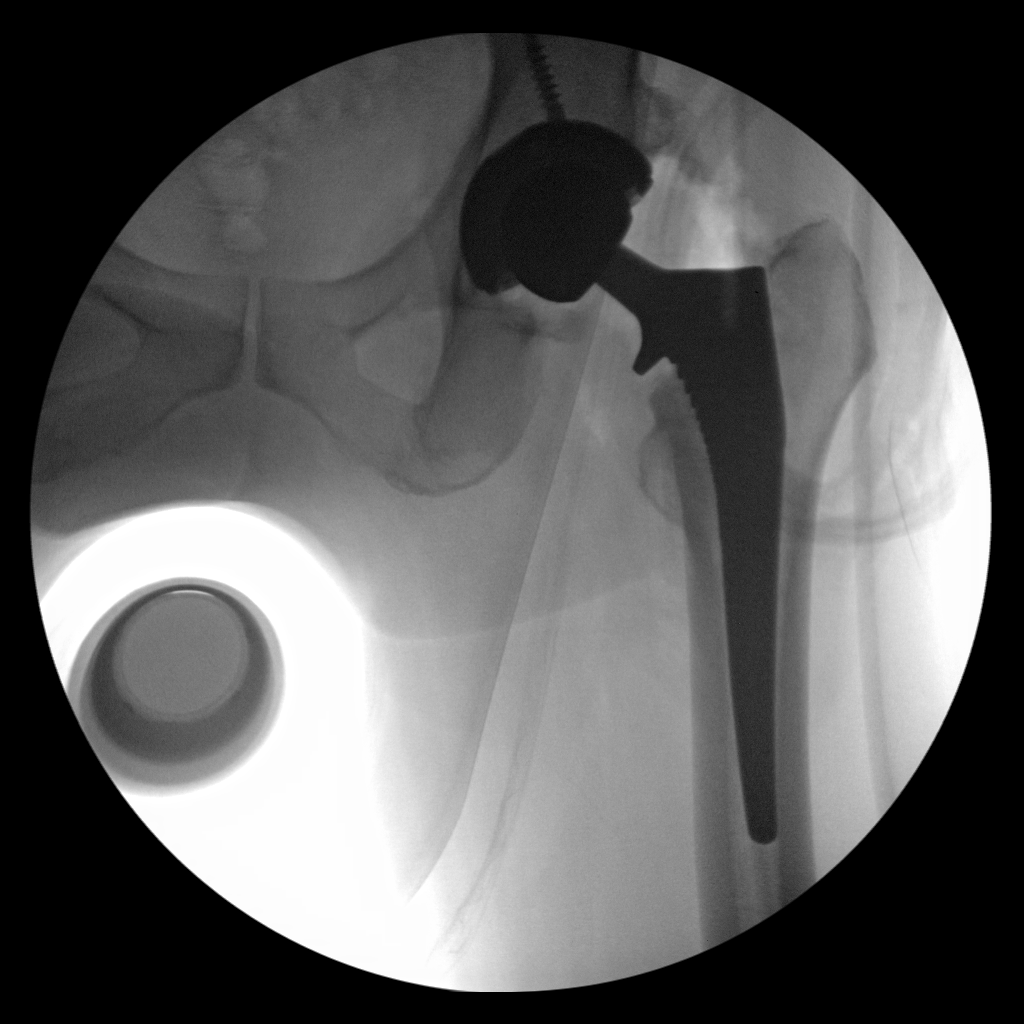
[im 5/5]
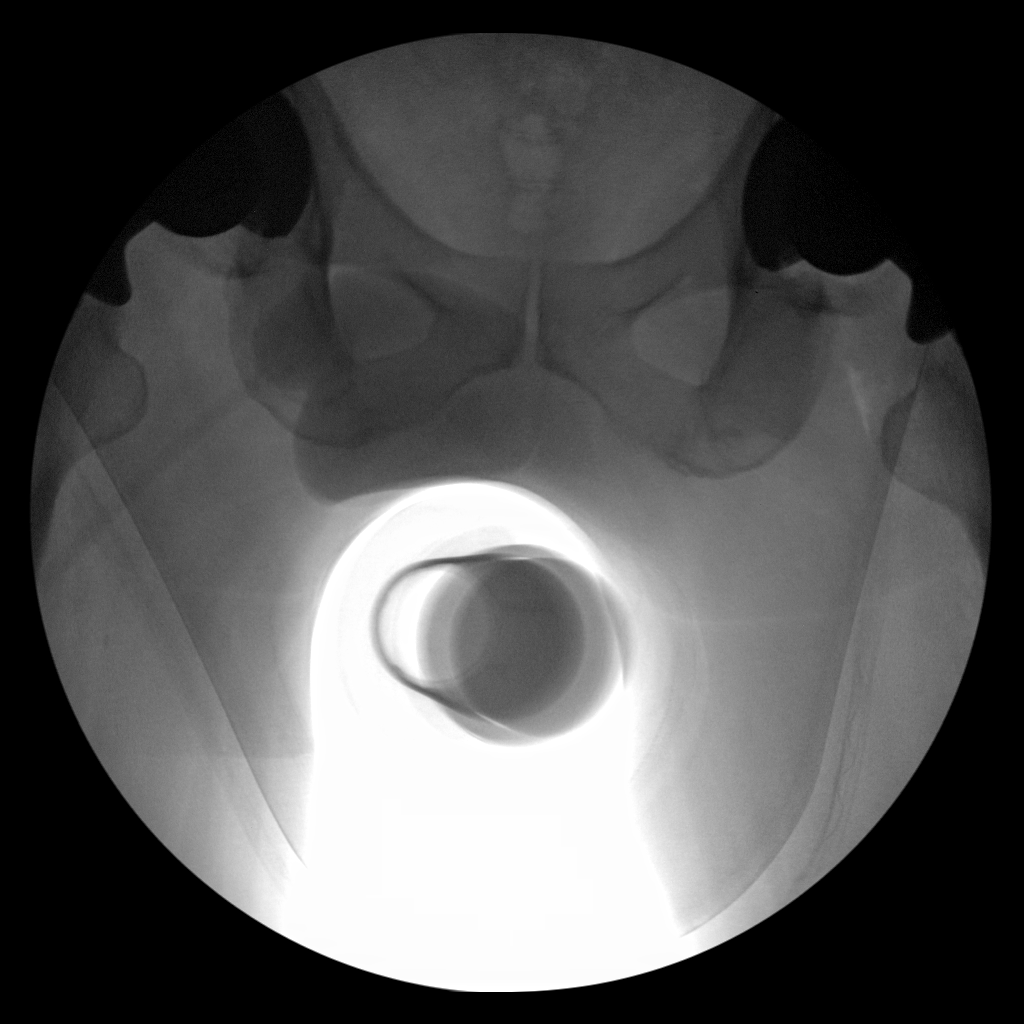

[5 of 5 positions shown; findings below may reference images not displayed]

FINDINGS: Remote changes of right hip replacement partially imaged. New left
hip replacement. No hardware bony complicating feature. Normal AP
alignment.
IMPRESSION: Interval left hip replacement.  No visible complicating feature.

## 2017-08-23 SURGERY — ARTHROPLASTY, HIP, TOTAL, ANTERIOR APPROACH
Anesthesia: Spinal | Site: Hip | Laterality: Left

## 2017-08-23 MED ORDER — PHENOL 1.4 % MT LIQD: 1.0000 | OROMUCOSAL | Status: DC | PRN

## 2017-08-23 MED ORDER — DEXTROSE 5 % IV SOLN
500.0000 mg | Freq: Four times a day (QID) | INTRAVENOUS | Status: DC | PRN
  Filled 2017-08-23: qty 5

## 2017-08-23 MED ORDER — DEXAMETHASONE SODIUM PHOSPHATE 10 MG/ML IJ SOLN
INTRAMUSCULAR | Status: DC | PRN
  Administered 2017-08-23: 10 mg via INTRAVENOUS

## 2017-08-23 MED ORDER — METHOCARBAMOL 500 MG PO TABS
500.0000 mg | ORAL_TABLET | Freq: Four times a day (QID) | ORAL | Status: DC | PRN
  Administered 2017-08-24 – 2017-08-25 (×4): 500 mg via ORAL
  Filled 2017-08-23 (×4): qty 1

## 2017-08-23 MED ORDER — TRANEXAMIC ACID 1000 MG/10ML IV SOLN
1000.0000 mg | INTRAVENOUS | Status: AC
  Administered 2017-08-23: 1000 mg via INTRAVENOUS
  Filled 2017-08-23: qty 1100

## 2017-08-23 MED ORDER — ONDANSETRON HCL 4 MG/2ML IJ SOLN: 4.0000 mg | Freq: Four times a day (QID) | INTRAMUSCULAR | Status: DC | PRN

## 2017-08-23 MED ORDER — SODIUM CHLORIDE 0.9 % IR SOLN
Status: DC | PRN
  Administered 2017-08-23: 1000 mL

## 2017-08-23 MED ORDER — CLINDAMYCIN PHOSPHATE 600 MG/50ML IV SOLN
600.0000 mg | Freq: Four times a day (QID) | INTRAVENOUS | Status: AC
  Administered 2017-08-23 (×2): 600 mg via INTRAVENOUS
  Filled 2017-08-23 (×2): qty 50

## 2017-08-23 MED ORDER — OXYCODONE HCL 5 MG PO TABS
5.0000 mg | ORAL_TABLET | ORAL | Status: DC | PRN
  Administered 2017-08-23 – 2017-08-25 (×9): 10 mg via ORAL
  Filled 2017-08-23 (×6): qty 2
  Filled 2017-08-23 (×2): qty 1
  Filled 2017-08-23: qty 2
  Filled 2017-08-23: qty 1
  Filled 2017-08-23: qty 2

## 2017-08-23 MED ORDER — INFLUENZA VAC SPLIT QUAD 0.5 ML IM SUSY
0.5000 mL | PREFILLED_SYRINGE | INTRAMUSCULAR | Status: DC
Start: 2017-08-24 — End: 2017-08-25

## 2017-08-23 MED ORDER — METOCLOPRAMIDE HCL 5 MG/ML IJ SOLN: 5.0000 mg | Freq: Three times a day (TID) | INTRAMUSCULAR | Status: DC | PRN

## 2017-08-23 MED ORDER — RIVAROXABAN 10 MG PO TABS
10.0000 mg | ORAL_TABLET | Freq: Every day | ORAL | Status: DC
  Administered 2017-08-24: 10 mg via ORAL
  Filled 2017-08-23: qty 1

## 2017-08-23 MED ORDER — PROPOFOL 500 MG/50ML IV EMUL
INTRAVENOUS | Status: DC | PRN
  Administered 2017-08-23: 4 ug/kg/min via INTRAVENOUS

## 2017-08-23 MED ORDER — HYDROMORPHONE HCL-NACL 0.5-0.9 MG/ML-% IV SOSY: 1.0000 mg | PREFILLED_SYRINGE | INTRAVENOUS | Status: DC | PRN

## 2017-08-23 MED ORDER — EPHEDRINE 5 MG/ML INJ
INTRAVENOUS | Status: AC
  Filled 2017-08-23: qty 10

## 2017-08-23 MED ORDER — ALUM & MAG HYDROXIDE-SIMETH 200-200-20 MG/5ML PO SUSP: 30.0000 mL | ORAL | Status: DC | PRN

## 2017-08-23 MED ORDER — BUPIVACAINE IN DEXTROSE 0.75-8.25 % IT SOLN
INTRATHECAL | Status: DC | PRN
  Administered 2017-08-23: 14 mg via INTRATHECAL

## 2017-08-23 MED ORDER — PROPOFOL 10 MG/ML IV BOLUS
INTRAVENOUS | Status: AC
  Filled 2017-08-23: qty 40

## 2017-08-23 MED ORDER — ONDANSETRON HCL 4 MG/2ML IJ SOLN
INTRAMUSCULAR | Status: DC | PRN
  Administered 2017-08-23: 4 mg via INTRAVENOUS

## 2017-08-23 MED ORDER — MIDAZOLAM HCL 2 MG/2ML IJ SOLN
INTRAMUSCULAR | Status: AC
  Filled 2017-08-23: qty 2

## 2017-08-23 MED ORDER — ACETAMINOPHEN 325 MG PO TABS: 650.0000 mg | ORAL_TABLET | Freq: Four times a day (QID) | ORAL | Status: DC | PRN

## 2017-08-23 MED ORDER — PHENYLEPHRINE 40 MCG/ML (10ML) SYRINGE FOR IV PUSH (FOR BLOOD PRESSURE SUPPORT)
PREFILLED_SYRINGE | INTRAVENOUS | Status: DC | PRN
  Administered 2017-08-23 (×2): 80 ug via INTRAVENOUS

## 2017-08-23 MED ORDER — LIDOCAINE 2% (20 MG/ML) 5 ML SYRINGE
INTRAMUSCULAR | Status: AC
  Filled 2017-08-23: qty 5

## 2017-08-23 MED ORDER — FENTANYL CITRATE (PF) 100 MCG/2ML IJ SOLN
INTRAMUSCULAR | Status: DC | PRN
  Administered 2017-08-23: 50 ug via INTRAVENOUS

## 2017-08-23 MED ORDER — METOCLOPRAMIDE HCL 5 MG PO TABS: 5.0000 mg | ORAL_TABLET | Freq: Three times a day (TID) | ORAL | Status: DC | PRN

## 2017-08-23 MED ORDER — STERILE WATER FOR IRRIGATION IR SOLN
Status: DC | PRN
  Administered 2017-08-23: 2000 mL

## 2017-08-23 MED ORDER — MIDAZOLAM HCL 5 MG/5ML IJ SOLN
INTRAMUSCULAR | Status: DC | PRN
  Administered 2017-08-23: 2 mg via INTRAVENOUS

## 2017-08-23 MED ORDER — CHLORHEXIDINE GLUCONATE 4 % EX LIQD: 60.0000 mL | Freq: Once | CUTANEOUS | Status: DC

## 2017-08-23 MED ORDER — ONDANSETRON HCL 4 MG/2ML IJ SOLN
INTRAMUSCULAR | Status: AC
  Filled 2017-08-23: qty 2

## 2017-08-23 MED ORDER — MEPERIDINE HCL 50 MG/ML IJ SOLN: 6.2500 mg | INTRAMUSCULAR | Status: DC | PRN

## 2017-08-23 MED ORDER — ONDANSETRON HCL 4 MG PO TABS: 4.0000 mg | ORAL_TABLET | Freq: Four times a day (QID) | ORAL | Status: DC | PRN

## 2017-08-23 MED ORDER — MENTHOL 3 MG MT LOZG: 1.0000 | LOZENGE | OROMUCOSAL | Status: DC | PRN

## 2017-08-23 MED ORDER — FENTANYL CITRATE (PF) 100 MCG/2ML IJ SOLN
INTRAMUSCULAR | Status: AC
  Filled 2017-08-23: qty 2

## 2017-08-23 MED ORDER — EPHEDRINE SULFATE-NACL 50-0.9 MG/10ML-% IV SOSY
PREFILLED_SYRINGE | INTRAVENOUS | Status: DC | PRN
  Administered 2017-08-23 (×2): 10 mg via INTRAVENOUS

## 2017-08-23 MED ORDER — LACTATED RINGERS IV SOLN: INTRAVENOUS | Status: AC

## 2017-08-23 MED ORDER — ACETAMINOPHEN 650 MG RE SUPP
650.0000 mg | Freq: Four times a day (QID) | RECTAL | Status: DC | PRN
Start: 2017-08-23 — End: 2017-08-25

## 2017-08-23 MED ORDER — CLINDAMYCIN PHOSPHATE 900 MG/50ML IV SOLN
INTRAVENOUS | Status: AC
  Filled 2017-08-23: qty 50

## 2017-08-23 MED ORDER — PHENYLEPHRINE 40 MCG/ML (10ML) SYRINGE FOR IV PUSH (FOR BLOOD PRESSURE SUPPORT)
PREFILLED_SYRINGE | INTRAVENOUS | Status: AC
  Filled 2017-08-23: qty 10

## 2017-08-23 MED ORDER — DEXTROSE 5 % IV SOLN
INTRAVENOUS | Status: DC | PRN
  Administered 2017-08-23: 15 ug/min via INTRAVENOUS

## 2017-08-23 MED ORDER — LIDOCAINE 2% (20 MG/ML) 5 ML SYRINGE
INTRAMUSCULAR | Status: DC | PRN
  Administered 2017-08-23: 100 mg via INTRAVENOUS

## 2017-08-23 MED ORDER — DOCUSATE SODIUM 100 MG PO CAPS
100.0000 mg | ORAL_CAPSULE | Freq: Two times a day (BID) | ORAL | Status: DC
  Administered 2017-08-23 – 2017-08-25 (×4): 100 mg via ORAL
  Filled 2017-08-23 (×4): qty 1

## 2017-08-23 MED ORDER — CLINDAMYCIN PHOSPHATE 900 MG/50ML IV SOLN
900.0000 mg | INTRAVENOUS | Status: AC
  Administered 2017-08-23: 900 mg via INTRAVENOUS

## 2017-08-23 MED ORDER — DIPHENHYDRAMINE HCL 12.5 MG/5ML PO ELIX: 12.5000 mg | ORAL_SOLUTION | ORAL | Status: DC | PRN

## 2017-08-23 MED ORDER — FENTANYL CITRATE (PF) 100 MCG/2ML IJ SOLN: 25.0000 ug | INTRAMUSCULAR | Status: DC | PRN

## 2017-08-23 MED ORDER — DEXAMETHASONE SODIUM PHOSPHATE 10 MG/ML IJ SOLN
INTRAMUSCULAR | Status: AC
  Filled 2017-08-23: qty 1

## 2017-08-23 MED ORDER — LACTATED RINGERS IV SOLN
INTRAVENOUS | Status: DC
  Administered 2017-08-23: 1000 mL via INTRAVENOUS

## 2017-08-23 MED ORDER — SODIUM CHLORIDE 0.9 % IV SOLN
INTRAVENOUS | Status: DC
  Administered 2017-08-23 (×2): 1000 mL via INTRAVENOUS

## 2017-08-23 MED ORDER — ONDANSETRON HCL 4 MG/2ML IJ SOLN: 4.0000 mg | Freq: Once | INTRAMUSCULAR | Status: DC | PRN

## 2017-08-23 SURGICAL SUPPLY — 33 items
BAG ZIPLOCK 12X15 (MISCELLANEOUS) IMPLANT
BENZOIN TINCTURE PRP APPL 2/3 (GAUZE/BANDAGES/DRESSINGS) ×2 IMPLANT
BLADE SAW SGTL 18X1.27X75 (BLADE) ×2 IMPLANT
CAPT HIP TOTAL 2 ×2 IMPLANT
CELLS DAT CNTRL 66122 CELL SVR (MISCELLANEOUS) ×1 IMPLANT
COVER PERINEAL POST (MISCELLANEOUS) ×2 IMPLANT
COVER SURGICAL LIGHT HANDLE (MISCELLANEOUS) ×2 IMPLANT
DRAPE STERI IOBAN 125X83 (DRAPES) ×2 IMPLANT
DRAPE U-SHAPE 47X51 STRL (DRAPES) ×4 IMPLANT
DRSG AQUACEL AG ADV 3.5X10 (GAUZE/BANDAGES/DRESSINGS) ×2 IMPLANT
DURAPREP 26ML APPLICATOR (WOUND CARE) ×2 IMPLANT
ELECT REM PT RETURN 15FT ADLT (MISCELLANEOUS) ×2 IMPLANT
GAUZE XEROFORM 1X8 LF (GAUZE/BANDAGES/DRESSINGS) IMPLANT
GLOVE BIO SURGEON STRL SZ7.5 (GLOVE) ×2 IMPLANT
GLOVE BIOGEL PI IND STRL 8 (GLOVE) ×2 IMPLANT
GLOVE BIOGEL PI INDICATOR 8 (GLOVE) ×2
GLOVE ECLIPSE 8.0 STRL XLNG CF (GLOVE) ×2 IMPLANT
GOWN STRL REUS W/TWL XL LVL3 (GOWN DISPOSABLE) ×8 IMPLANT
HANDPIECE INTERPULSE COAX TIP (DISPOSABLE) ×1
HOLDER FOLEY CATH W/STRAP (MISCELLANEOUS) ×2 IMPLANT
PACK ANTERIOR HIP CUSTOM (KITS) ×2 IMPLANT
RTRCTR WOUND ALEXIS 18CM MED (MISCELLANEOUS) ×2
SET HNDPC FAN SPRY TIP SCT (DISPOSABLE) ×1 IMPLANT
STAPLER VISISTAT 35W (STAPLE) IMPLANT
STRIP CLOSURE SKIN 1/2X4 (GAUZE/BANDAGES/DRESSINGS) ×2 IMPLANT
SUT ETHIBOND NAB CT1 #1 30IN (SUTURE) ×2 IMPLANT
SUT MNCRL AB 4-0 PS2 18 (SUTURE) ×2 IMPLANT
SUT VIC AB 0 CT1 36 (SUTURE) ×2 IMPLANT
SUT VIC AB 1 CT1 36 (SUTURE) ×2 IMPLANT
SUT VIC AB 2-0 CT1 27 (SUTURE) ×2
SUT VIC AB 2-0 CT1 TAPERPNT 27 (SUTURE) ×2 IMPLANT
TRAY FOLEY W/METER SILVER 16FR (SET/KITS/TRAYS/PACK) ×2 IMPLANT
YANKAUER SUCT BULB TIP 10FT TU (MISCELLANEOUS) ×2 IMPLANT

## 2017-08-23 NOTE — Anesthesia Procedure Notes (Signed)
Spinal  Patient location during procedure: OR Start time: 08/23/2017 8:54 AM End time: 08/23/2017 9:01 AM Staffing Anesthesiologist: Dayle Sherpa Preanesthetic Checklist Completed: patient identified, site marked, surgical consent, pre-op evaluation, timeout performed, IV checked, risks and benefits discussed and monitors and equipment checked Spinal Block Patient position: sitting Prep: DuraPrep Patient monitoring: heart rate, cardiac monitor, continuous pulse ox and blood pressure Approach: midline Location: L3-4 Injection technique: single-shot Needle Needle type: Sprotte  Needle gauge: 24 G Needle length: 9 cm Assessment Sensory level: T6

## 2017-08-23 NOTE — Discharge Instructions (Addendum)
INSTRUCTIONS AFTER JOINT REPLACEMENT  ° °o Remove items at home which could result in a fall. This includes throw rugs or furniture in walking pathways °o ICE to the affected joint every three hours while awake for 30 minutes at a time, for at least the first 3-5 days, and then as needed for pain and swelling.  Continue to use ice for pain and swelling. You may notice swelling that will progress down to the foot and ankle.  This is normal after surgery.  Elevate your leg when you are not up walking on it.   °o Continue to use the breathing machine you got in the hospital (incentive spirometer) which will help keep your temperature down.  It is common for your temperature to cycle up and down following surgery, especially at night when you are not up moving around and exerting yourself.  The breathing machine keeps your lungs expanded and your temperature down. ° ° °DIET:  As you were doing prior to hospitalization, we recommend a well-balanced diet. ° °DRESSING / WOUND CARE / SHOWERING ° °Keep the surgical dressing until follow up.  The dressing is water proof, so you can shower without any extra covering.  IF THE DRESSING FALLS OFF or the wound gets wet inside, change the dressing with sterile gauze.  Please use good hand washing techniques before changing the dressing.  Do not use any lotions or creams on the incision until instructed by your surgeon.   ° °ACTIVITY ° °o Increase activity slowly as tolerated, but follow the weight bearing instructions below.   °o No driving for 6 weeks or until further direction given by your physician.  You cannot drive while taking narcotics.  °o No lifting or carrying greater than 10 lbs. until further directed by your surgeon. °o Avoid periods of inactivity such as sitting longer than an hour when not asleep. This helps prevent blood clots.  °o You may return to work once you are authorized by your doctor.  ° ° ° °WEIGHT BEARING  ° °Weight bearing as tolerated with assist  device (walker, cane, etc) as directed, use it as long as suggested by your surgeon or therapist, typically at least 4-6 weeks. ° ° °EXERCISES ° °Results after joint replacement surgery are often greatly improved when you follow the exercise, range of motion and muscle strengthening exercises prescribed by your doctor. Safety measures are also important to protect the joint from further injury. Any time any of these exercises cause you to have increased pain or swelling, decrease what you are doing until you are comfortable again and then slowly increase them. If you have problems or questions, call your caregiver or physical therapist for advice.  ° °Rehabilitation is important following a joint replacement. After just a few days of immobilization, the muscles of the leg can become weakened and shrink (atrophy).  These exercises are designed to build up the tone and strength of the thigh and leg muscles and to improve motion. Often times heat used for twenty to thirty minutes before working out will loosen up your tissues and help with improving the range of motion but do not use heat for the first two weeks following surgery (sometimes heat can increase post-operative swelling).  ° °These exercises can be done on a training (exercise) mat, on the floor, on a table or on a bed. Use whatever works the best and is most comfortable for you.    Use music or television while you are exercising so that   the exercises are a pleasant break in your day. This will make your life better with the exercises acting as a break in your routine that you can look forward to.   Perform all exercises about fifteen times, three times per day or as directed.  You should exercise both the operative leg and the other leg as well. ° °Exercises include: °  °• Quad Sets - Tighten up the muscle on the front of the thigh (Quad) and hold for 5-10 seconds.   °• Straight Leg Raises - With your knee straight (if you were given a brace, keep it on),  lift the leg to 60 degrees, hold for 3 seconds, and slowly lower the leg.  Perform this exercise against resistance later as your leg gets stronger.  °• Leg Slides: Lying on your back, slowly slide your foot toward your buttocks, bending your knee up off the floor (only go as far as is comfortable). Then slowly slide your foot back down until your leg is flat on the floor again.  °• Angel Wings: Lying on your back spread your legs to the side as far apart as you can without causing discomfort.  °• Hamstring Strength:  Lying on your back, push your heel against the floor with your leg straight by tightening up the muscles of your buttocks.  Repeat, but this time bend your knee to a comfortable angle, and push your heel against the floor.  You may put a pillow under the heel to make it more comfortable if necessary.  ° °A rehabilitation program following joint replacement surgery can speed recovery and prevent re-injury in the future due to weakened muscles. Contact your doctor or a physical therapist for more information on knee rehabilitation.  ° ° °CONSTIPATION ° °Constipation is defined medically as fewer than three stools per week and severe constipation as less than one stool per week.  Even if you have a regular bowel pattern at home, your normal regimen is likely to be disrupted due to multiple reasons following surgery.  Combination of anesthesia, postoperative narcotics, change in appetite and fluid intake all can affect your bowels.  ° °YOU MUST use at least one of the following options; they are listed in order of increasing strength to get the job done.  They are all available over the counter, and you may need to use some, POSSIBLY even all of these options:   ° °Drink plenty of fluids (prune juice may be helpful) and high fiber foods °Colace 100 mg by mouth twice a day  °Senokot for constipation as directed and as needed Dulcolax (bisacodyl), take with full glass of water  °Miralax (polyethylene glycol)  once or twice a day as needed. ° °If you have tried all these things and are unable to have a bowel movement in the first 3-4 days after surgery call either your surgeon or your primary doctor.   ° °If you experience loose stools or diarrhea, hold the medications until you stool forms back up.  If your symptoms do not get better within 1 week or if they get worse, check with your doctor.  If you experience "the worst abdominal pain ever" or develop nausea or vomiting, please contact the office immediately for further recommendations for treatment. ° ° °ITCHING:  If you experience itching with your medications, try taking only a single pain pill, or even half a pain pill at a time.  You can also use Benadryl over the counter for itching or also to   help with sleep.   TED HOSE STOCKINGS:  Use stockings on both legs until for at least 2 weeks or as directed by physician office. They may be removed at night for sleeping.  MEDICATIONS:  See your medication summary on the After Visit Summary that nursing will review with you.  You may have some home medications which will be placed on hold until you complete the course of blood thinner medication.  It is important for you to complete the blood thinner medication as prescribed.  PRECAUTIONS:  If you experience chest pain or shortness of breath - call 911 immediately for transfer to the hospital emergency department.   If you develop a fever greater that 101 F, purulent drainage from wound, increased redness or drainage from wound, foul odor from the wound/dressing, or calf pain - CONTACT YOUR SURGEON.                                                   FOLLOW-UP APPOINTMENTS:  If you do not already have a post-op appointment, please call the office for an appointment to be seen by your surgeon.  Guidelines for how soon to be seen are listed in your After Visit Summary, but are typically between 1-4 weeks after surgery.  OTHER INSTRUCTIONS:   Knee  Replacement:  Do not place pillow under knee, focus on keeping the knee straight while resting. CPM instructions: 0-90 degrees, 2 hours in the morning, 2 hours in the afternoon, and 2 hours in the evening. Place foam block, curve side up under heel at all times except when in CPM or when walking.  DO NOT modify, tear, cut, or change the foam block in any way.  MAKE SURE YOU:   Understand these instructions.   Get help right away if you are not doing well or get worse.    Thank you for letting us be a part of your medical care team.  It is a privilege we respect greatly.  We hope these instructions will help you stay on track for a fast and full recovery!    TAKE AN 81 MG ASPIRIN TWICE DAILY UNTIL FURTHER NOTICE.

## 2017-08-23 NOTE — Brief Op Note (Signed)
08/23/2017  10:16 AM  PATIENT:  Jose Kaufman  66 y.o. male  PRE-OPERATIVE DIAGNOSIS:  left hip osteoarthritis  POST-OPERATIVE DIAGNOSIS:  left hip osteoarthritis  PROCEDURE:  Procedure(s): LEFT TOTAL HIP ARTHROPLASTY ANTERIOR APPROACH (Left)  SURGEON:  Surgeon(s) and Role:    Mcarthur Rossetti, MD - Primary  PHYSICIAN ASSISTANT: Benita Stabile, PA-C  ANESTHESIA:   spinal  EBL:  Total I/O In: 1000 [I.V.:1000] Out: 425 [Urine:125; Blood:300]  COUNTS:  YES  DICTATION: .Other Dictation: Dictation Number [id number]  PLAN OF CARE: Admit to inpatient   PATIENT DISPOSITION:  PACU - hemodynamically stable.   Delay start of Pharmacological VTE agent (>24hrs) due to surgical blood loss or risk of bleeding: no

## 2017-08-23 NOTE — Op Note (Deleted)
  The note originally documented on this encounter has been moved the the encounter in which it belongs.  

## 2017-08-23 NOTE — Evaluation (Signed)
Physical Therapy Evaluation Patient Details Name: Jose Kaufman MRN: [id number] DOB: Jun 21, 1951 Today's Date: 08/23/2017   History of Present Illness  Pt s/p L THR and with hx of R THR 4/18  Clinical Impression  Pt s/p L THR and presents with decreased L LE strength/ROM and post op pain limiting functional mobility.  Pt should progress to dc home with family assist and HHPT follow up.    Follow Up Recommendations Home health PT    Equipment Recommendations  None recommended by PT    Recommendations for Other Services OT consult     Precautions / Restrictions Precautions Precautions: Fall Restrictions Weight Bearing Restrictions: No Other Position/Activity Restrictions: WBAT      Mobility  Bed Mobility Overal bed mobility: Needs Assistance Bed Mobility: Supine to Sit     Supine to sit: Min assist     General bed mobility comments: cues for sequence and use of R LE to self assist  Transfers Overall transfer level: Needs assistance Equipment used: Rolling walker (2 wheeled) Transfers: Sit to/from Stand Sit to Stand: Min assist         General transfer comment: cues for LE management and use of UEs to self assist  Ambulation/Gait Ambulation/Gait assistance: Min assist Ambulation Distance (Feet): 120 Feet Assistive device: Rolling walker (2 wheeled) Gait Pattern/deviations: Step-to pattern;Step-through pattern;Decreased step length - right;Decreased step length - left;Shuffle;Trunk flexed Gait velocity: decr Gait velocity interpretation: Below normal speed for age/gender General Gait Details: cues for posture, position from RW and initial sequence  Stairs            Wheelchair Mobility    Modified Rankin (Stroke Patients Only)       Balance                                             Pertinent Vitals/Pain Pain Assessment: 0-10 Pain Score: 4  Pain Location: L hip Pain Descriptors / Indicators: Aching;Sore Pain  Intervention(s): Limited activity within patient's tolerance;Monitored during session;Premedicated before session;Ice applied    Home Living Family/patient expects to be discharged to:: Private residence Living Arrangements: Alone Available Help at Discharge: Family;Available 24 hours/day Type of Home: House Home Access: Stairs to enter Entrance Stairs-Rails: Psychiatric nurse of Steps: 3 Home Layout: One level Home Equipment: Cane - single point;Shower seat;Walker - 2 wheels Additional Comments: Brother will stay with pt for a couple days following D/C.     Prior Function Level of Independence: Independent               Hand Dominance        Extremity/Trunk Assessment   Upper Extremity Assessment Upper Extremity Assessment: Overall WFL for tasks assessed    Lower Extremity Assessment Lower Extremity Assessment: LLE deficits/detail    Cervical / Trunk Assessment Cervical / Trunk Assessment: Normal  Communication   Communication: No difficulties  Cognition Arousal/Alertness: Awake/alert Behavior During Therapy: WFL for tasks assessed/performed Overall Cognitive Status: Within Functional Limits for tasks assessed                                        General Comments      Exercises     Assessment/Plan    PT Assessment Patient needs continued PT services  PT Problem List Decreased strength;Decreased range  of motion;Decreased activity tolerance;Decreased mobility;Pain;Decreased balance;Decreased knowledge of use of DME       PT Treatment Interventions DME instruction;Gait training;Stair training;Functional mobility training;Therapeutic activities;Therapeutic exercise;Patient/family education    PT Goals (Current goals can be found in the Care Plan section)  Acute Rehab PT Goals Patient Stated Goal: Back to work ASAP PT Goal Formulation: With patient Time For Goal Achievement: 08/27/17 Potential to Achieve Goals: Good     Frequency 7X/week   Barriers to discharge        Co-evaluation               AM-PAC PT "6 Clicks" Daily Activity  Outcome Measure Difficulty turning over in bed (including adjusting bedclothes, sheets and blankets)?: Unable Difficulty moving from lying on back to sitting on the side of the bed? : Unable Difficulty sitting down on and standing up from a chair with arms (e.g., wheelchair, bedside commode, etc,.)?: Unable Help needed moving to and from a bed to chair (including a wheelchair)?: A Lot Help needed walking in hospital room?: A Little Help needed climbing 3-5 steps with a railing? : A Lot 6 Click Score: 10    End of Session Equipment Utilized During Treatment: Gait belt Activity Tolerance: Patient tolerated treatment well Patient left: in chair;with call bell/phone within reach;with chair alarm set Nurse Communication: Mobility status PT Visit Diagnosis: Unsteadiness on feet (R26.81);Difficulty in walking, not elsewhere classified (R26.2)    Time: 1550-1616 PT Time Calculation (min) (ACUTE ONLY): 26 min   Charges:   PT Evaluation $PT Eval Low Complexity: 1 Low PT Treatments $Gait Training: 8-22 mins   PT G Codes:        Pg [phone]   Brynna Dobos 08/23/2017, 5:38 PM

## 2017-08-23 NOTE — H&P (Signed)
TOTAL HIP ADMISSION H&P  Patient is admitted for left total hip arthroplasty.  Subjective:  Chief Complaint: left hip pain  HPI: Jose Kaufman, 66 y.o. male, has a history of pain and functional disability in the left hip(s) due to arthritis and patient has failed non-surgical conservative treatments for greater than 12 weeks to include NSAID's and/or analgesics, corticosteriod injections, flexibility and strengthening excercises and activity modification.  Onset of symptoms was gradual starting 3 years ago with gradually worsening course since that time.The patient noted no past surgery on the left hip(s).  Patient currently rates pain in the left hip at 10 out of 10 with activity. Patient has night pain, worsening of pain with activity and weight bearing, pain that interfers with activities of daily living and pain with passive range of motion. Patient has evidence of subchondral sclerosis, periarticular osteophytes and joint space narrowing by imaging studies. This condition presents safety issues increasing the risk of falls.  There is no current active infection.  Patient Active Problem List   Diagnosis Date Noted  . Unilateral primary osteoarthritis, left hip 05/02/2017  . Status post total replacement of right hip 03/22/2017  . PVCs (premature ventricular contractions) 03/14/2017  . Essential hypertension 03/14/2017  . Unilateral primary osteoarthritis, right hip 03/08/2017  . Bilateral hip pain 10/15/2016  . Abnormality of gait 10/15/2016   Past Medical History:  Diagnosis Date  . Abnormality of gait 10/15/2016  . Arthritis   . Dysrhythmia    hx of  . Essential hypertension 03/14/2017  . PVCs (premature ventricular contractions) 03/14/2017    Past Surgical History:  Procedure Laterality Date  . TOTAL HIP ARTHROPLASTY Right 03/22/2017   Procedure: RIGHT TOTAL HIP ARTHROPLASTY ANTERIOR APPROACH;  Surgeon: Mcarthur Rossetti, MD;  Location: [facility];  Service: Orthopedics;   Laterality: Right;  . TOTAL HIP ARTHROPLASTY     Left 08/23/17 Dr. Zollie Beckers    Prescriptions Prior to Admission  Medication Sig Dispense Refill Last Dose  . acetaminophen (TYLENOL) 325 MG tablet Take 325 mg by mouth every 6 (six) hours as needed for mild pain.    03/21/2017 at 1700  . lisinopril-hydrochlorothiazide (PRINZIDE,ZESTORETIC) 10-12.5 MG tablet Take 1 tablet by mouth daily.     . Omega-3 Fatty Acids (FISH OIL PO) Take 1 capsule by mouth every evening.    Past Month at Unknown time  . silver sulfADIAZINE (SILVADENE) 1 % cream Apply 1 application topically daily as needed (rash). Applied topically to affected area of skin after bathing.    03/21/2017 at 1700   Allergies  Allergen Reactions  . Penicillins Other (See Comments)    Childhood reaction. Has patient had a PCN reaction causing immediate rash, facial/tongue/throat swelling, SOB or lightheadedness with hypotension: unsure Has patient had a PCN reaction causing severe rash involving mucus membranes or skin necrosis: unsure Has patient had a PCN reaction that required hospitalization:No Has patient had a PCN reaction occurring within the last 10 years:No If all of the above answers are "NO", then may proceed with Cephalosporin use    Social History  Substance Use Topics  . Smoking status: Former Smoker    Types: Cigarettes    Quit date: 05/20/1992  . Smokeless tobacco: Never Used  . Alcohol use Yes     Comment: Occasional    Family History  Problem Relation Age of Onset  . Leukemia Mother   . Pneumonia Father      Review of Systems  Musculoskeletal: Positive for joint pain.  All  other systems reviewed and are negative.   Objective:  Physical Exam  Constitutional: He is oriented to person, place, and time. He appears well-developed and well-nourished.  HENT:  Head: Normocephalic and atraumatic.  Eyes: Pupils are equal, round, and reactive to light. EOM are normal.  Neck: Normal range of motion. Neck  supple.  Cardiovascular: Normal rate and regular rhythm.   Respiratory: Effort normal and breath sounds normal.  GI: Soft. Bowel sounds are normal.  Musculoskeletal:       Left hip: He exhibits decreased range of motion, decreased strength, tenderness and bony tenderness.  Neurological: He is alert and oriented to person, place, and time.  Skin: Skin is warm and dry.  Psychiatric: He has a normal mood and affect.    Vital signs in last 24 hours: Temp:  [98.3 F (36.8 C)] 98.3 F (36.8 C) (09/14 0714) Pulse Rate:  [81] 81 (09/14 0714) Resp:  [16] 16 (09/14 0714) BP: (115)/(63) 115/63 (09/14 0714) SpO2:  [98 %] 98 % (09/14 0714)  Labs:   Estimated body mass index is 24.33 kg/m as calculated from the following:   Height as of 08/09/17: 5\' 8"  (1.727 m).   Weight as of 08/09/17: 160 lb (72.6 kg).   Imaging Review Plain radiographs demonstrate severe degenerative joint disease of the left hip(s). The bone quality appears to be excellent for age and reported activity level.  Assessment/Plan:  End stage arthritis, left hip(s)  The patient history, physical examination, clinical judgement of the provider and imaging studies are consistent with end stage degenerative joint disease of the left hip(s) and total hip arthroplasty is deemed medically necessary. The treatment options including medical management, injection therapy, arthroscopy and arthroplasty were discussed at length. The risks and benefits of total hip arthroplasty were presented and reviewed. The risks due to aseptic loosening, infection, stiffness, dislocation/subluxation,  thromboembolic complications and other imponderables were discussed.  The patient acknowledged the explanation, agreed to proceed with the plan and consent was signed. Patient is being admitted for inpatient treatment for surgery, pain control, PT, OT, prophylactic antibiotics, VTE prophylaxis, progressive ambulation and ADL's and discharge planning.The  patient is planning to be discharged home with home health services

## 2017-08-23 NOTE — Op Note (Signed)
NAMEJAVIEL, Jose Kaufman NO.:  [id number]  MEDICAL RECORD NO.:  [id number]  LOCATION:                                 FACILITY:  PHYSICIAN:  Lind Guest. Ninfa Linden, M.D.DATE OF BIRTH:  1951/09/20  DATE OF PROCEDURE:  08/23/2017 DATE OF DISCHARGE:  08/25/2017                              OPERATIVE REPORT   POSTOPERATIVE DIAGNOSIS:  Primary osteoarthritis, degenerative joint disease, left hip.  POSTOPERATIVE DIAGNOSIS:  Primary osteoarthritis, degenerative joint disease, left hip.  PROCEDURE:  Left total hip arthroplasty through direct anterior approach.  IMPLANTS:  DePuy Sector Gription acetabular component size 56, size 36 +0 polyethylene liner, size 12 Corail femoral component with varus offset, size 36+ 5 ceramic hip ball.  SURGEON:  Lind Guest. Ninfa Linden, M.D.  ASSISTANT:  Erskine Emery, PA-C.  ANESTHESIA:  Spinal.  ANTIBIOTICS:  900 mg IV clindamycin.  BLOOD LOSS:  300 mL.  COMPLICATIONS:  None.  INDICATIONS:  Jose Kaufman is a 66 year old gentleman, well known to me. He has bilateral hip osteoarthritis, which is quite severe.  Just in April of this year, he underwent a successful right total hip arthroplasty, and has done so well with that.  He would like to proceed with his left total hip arthroplasty today which is indicated given the severity of his arthritis as well as daily pain and decreased mobility. He understands the risk of acute blood loss anemia, nerve and vessel injury, fracture, infection, dislocation, DVT.  He understands our goals are to decrease his pain, improve mobility, and overall improve quality of life.  DESCRIPTION OF PROCEDURE:  After informed consent was obtained, appropriate left hip was marked.  He was brought to the operating room where spinal anesthesia was obtained while he was on the stretcher.  He was laid in a supine position on the stretcher.  A Foley catheter was placed and both feet had traction boots  applied to them.  Next, he was placed supine on the Hana fracture table with the perineal post in place and both legs in inline skeletal traction devices, but no traction applied.  His left operative hip was prepped and draped with DuraPrep and sterile drapes.  Time-out was called.  He was identified as correct patient and correct left hip.  I then made an incision just inferior and posterior to the anterior superior iliac spine and carried this obliquely down the leg.  We dissected down the tensor fascia lata muscle.  The tensor fascia was then divided longitudinally to proceed with a direct anterior approach to the hip.  We identified and cauterized the circumflex vessels and then identified the hip capsule. I opened up the hip capsule in an L-type format, finding a moderate joint effusion and significant arthritis in his left hip.  We placed Cobra retractors around the medial and lateral femoral neck and then made a femoral neck cut with an oscillating saw and then completed this on osteotome.  We placed a corkscrew guide in the femoral head and removed the femoral head in its entirety and found it to be devoid of cartilage.  We then placed a bent Hohmann over the medial acetabular rim and cleaned remnants of acetabular labrum and  other debris from the acetabulum.  We then began reaming under direct visualization from a size 43 reamer in stepwise increments all the way up to a size 56 with all reamers under direct visualization and the last reamer under direct fluoroscopy, so we could obtain our depth of reaming, our inclination, and anteversion.  I then placed the real DePuy Sector Gription acetabular component size 56 and a single screw.  We placed a 36 +0 polyethylene liner for that size acetabular component.  Attention was then turned to the femur.  The leg is externally rotated to 120 degrees and extended and adducted.  We were able to place a Mueller retractor medially and a  Hohmann retractor behind the greater trochanter.  We broached using the Corail broaching system from a size 8 broach going up to a size 12.  With a size 12 in place, we trialed a varus offset femoral neck and a 36+ 1.5 hip ball.  We rolled the leg back over and up with traction and rotation reducing the pelvis.  We were pleased with stability, but we needed more leg length and offset.  We dislocated the hip and removed the trial components.  We placed the real Corail femoral component size 12 and then a 36+ 5 ceramic hip ball and reduced this in the acetabulum.  We were pleased with leg length offset, and range of motion and stability.  We then irrigated the soft tissue with normal saline solution.  We closed the joint capsule with interrupted #1 Ethibond suture followed by running #1 Vicryl in the tensor fascia, 0 Vicryl in the deep tissue, 2-0 Vicryl in the subcutaneous tissue, 4-0 Monocryl subcuticular stitch and Steri-Strips on the skin.  An Aquacel dressing was applied.  He was taken off the Hana table, taken to the recovery room in stable condition.  All final counts were correct. There were no complications noted.  Of note, Erskine Emery, P.A-C assisted in the entire case from the beginning to the end and his assistance was crucial for facilitating all aspects of this case.     Lind Guest. Ninfa Linden, M.D.     CYB/MEDQ  D:  08/23/2017  T:  08/23/2017  Job:  [id number]

## 2017-08-23 NOTE — Transfer of Care (Signed)
Immediate Anesthesia Transfer of Care Note  Patient: Jose Kaufman  Procedure(s) Performed: Procedure(s): LEFT TOTAL HIP ARTHROPLASTY ANTERIOR APPROACH (Left)  Patient Location: PACU  Anesthesia Type:MAC and Spinal  Level of Consciousness: awake, alert , oriented and patient cooperative  Airway & Oxygen Therapy: Patient Spontanous Breathing and Patient connected to face mask oxygen  Post-op Assessment: Report given to RN and Post -op Vital signs reviewed and stable  Post vital signs: Reviewed and stable  Last Vitals:  Vitals:   08/23/17 0714  BP: 115/63  Pulse: 81  Resp: 16  Temp: 36.8 C  SpO2: 98%    Last Pain:  Vitals:   08/23/17 0714  TempSrc: Oral      Patients Stated Pain Goal: 4 (65/03/54 6568)  Complications: No apparent anesthesia complications

## 2017-08-23 NOTE — Anesthesia Postprocedure Evaluation (Signed)
Anesthesia Post Note  Patient: Jose Kaufman  Procedure(s) Performed: Procedure(s) (LRB): LEFT TOTAL HIP ARTHROPLASTY ANTERIOR APPROACH (Left)     Patient location during evaluation: PACU Anesthesia Type: Spinal Level of consciousness: oriented and awake and alert Pain management: pain level controlled Vital Signs Assessment: post-procedure vital signs reviewed and stable Respiratory status: spontaneous breathing, respiratory function stable and patient connected to nasal cannula oxygen Cardiovascular status: blood pressure returned to baseline and stable Postop Assessment: no headache, no backache and no apparent nausea or vomiting Anesthetic complications: no    Last Vitals:  Vitals:   08/23/17 1054 08/23/17 1100  BP: (!) 78/68 (!) 85/50  Pulse:  (!) 41  Resp:  17  Temp:    SpO2:  100%    Last Pain:  Vitals:   08/23/17 1100  TempSrc:   PainSc: 0-No pain    LLE Motor Response: No movement due to regional block (08/23/17 1100) LLE Sensation: Decreased (08/23/17 1100) RLE Motor Response: Purposeful movement (08/23/17 1100) RLE Sensation: Decreased (08/23/17 1100)      Lanisa Ishler

## 2017-08-24 LAB — BASIC METABOLIC PANEL
Anion gap: 4 — ABNORMAL LOW (ref 5–15)
BUN: 26 mg/dL — AB (ref 6–20)
CHLORIDE: 105 mmol/L (ref 101–111)
CO2: 28 mmol/L (ref 22–32)
CREATININE: 1.27 mg/dL — AB (ref 0.61–1.24)
Calcium: 8.8 mg/dL — ABNORMAL LOW (ref 8.9–10.3)
GFR calc Af Amer: >60 mL/min (ref >60)
GFR calc non Af Amer: 58 mL/min — ABNORMAL LOW (ref >60)
Glucose, Bld: 122 mg/dL — ABNORMAL HIGH (ref 65–99)
POTASSIUM: 4.4 mmol/L (ref 3.5–5.1)
Sodium: 137 mmol/L (ref 135–145)

## 2017-08-24 LAB — CBC
HEMATOCRIT: 37.1 % — AB (ref 39.0–52.0)
HEMOGLOBIN: 12.4 g/dL — AB (ref 13.0–17.0)
MCH: 30.3 pg (ref 26.0–34.0)
MCHC: 33.4 g/dL (ref 30.0–36.0)
MCV: 90.7 fL (ref 78.0–100.0)
Platelets: 205 10*3/uL (ref 150–400)
RBC: 4.09 MIL/uL — AB (ref 4.22–5.81)
RDW: 13.2 % (ref 11.5–15.5)
WBC: 17.3 10*3/uL — ABNORMAL HIGH (ref 4.0–10.5)

## 2017-08-24 MED ORDER — ASPIRIN 81 MG PO CHEW
81.0000 mg | CHEWABLE_TABLET | Freq: Two times a day (BID) | ORAL | Status: DC
  Administered 2017-08-24 – 2017-08-25 (×3): 81 mg via ORAL
  Filled 2017-08-24 (×3): qty 1

## 2017-08-24 MED ORDER — LISINOPRIL 10 MG PO TABS
10.0000 mg | ORAL_TABLET | Freq: Every day | ORAL | Status: DC
  Administered 2017-08-25: 10 mg via ORAL
  Filled 2017-08-24 (×2): qty 1

## 2017-08-24 NOTE — Care Management Note (Signed)
Case Management Note  Patient Details  Name: Jose Kaufman MRN: [id number] Date of Birth: 15-Feb-1951  Subjective/Objective:    S/p L THR                Action/Plan: Discharge Planning: NCM spoke to pt and offered choice for [facility]. Pt states he had [facility] in the past. Requesting [facility]. Has RW and bedside commode at home. Brother will be at home to assist with care.   PCP Michel Harrow MD  Expected Discharge Date:                Expected Discharge Plan:  [place]  In-House Referral:  NA  Discharge planning Services  CM Consult  Post Acute Care Choice:  Home Health Choice offered to:  Patient  DME Arranged:  N/A DME Agency:  NA  HH Arranged:  PT [place] Agency:  [facility] ([facility]  Status of Service:  Completed, signed off  If discussed at [employer] of Stay Meetings, dates discussed:    Additional Comments:  Erenest Rasher, RN 08/24/2017, 1:01 PM

## 2017-08-24 NOTE — Progress Notes (Signed)
Physical Therapy Treatment Patient Details Name: Jose Kaufman MRN: [id number] DOB: 11-25-1951 Today's Date: 08/24/2017    History of Present Illness Pt s/p L THR and with hx of R THR 4/18    PT Comments    Pt very motivated and progressing with mobility but with frequent cues to SLOW down for safety.   Follow Up Recommendations  Home health PT     Equipment Recommendations  None recommended by PT    Recommendations for Other Services OT consult     Precautions / Restrictions Precautions Precautions: Fall Restrictions Weight Bearing Restrictions: No Other Position/Activity Restrictions: WBAT    Mobility  Bed Mobility               General bed mobility comments: OOB with nursing to bathroom  Transfers Overall transfer level: Needs assistance Equipment used: Rolling walker (2 wheeled) Transfers: Sit to/from Stand Sit to Stand: Min assist         General transfer comment: cues for LE management and use of UEs to self assist  Ambulation/Gait Ambulation/Gait assistance: Min assist Ambulation Distance (Feet): 200 Feet Assistive device: Rolling walker (2 wheeled) Gait Pattern/deviations: Step-to pattern;Step-through pattern;Decreased step length - right;Decreased step length - left;Shuffle;Trunk flexed Gait velocity: decr   General Gait Details: cues for posture, position from RW, to increase stride length and initial sequence   Stairs            Wheelchair Mobility    Modified Rankin (Stroke Patients Only)       Balance                                            Cognition Arousal/Alertness: Awake/alert Behavior During Therapy: WFL for tasks assessed/performed Overall Cognitive Status: Within Functional Limits for tasks assessed                                        Exercises Total Joint Exercises Ankle Circles/Pumps: AROM;Both;20 reps;Supine Quad Sets: AROM;Both;10 reps;Supine Heel Slides:  AAROM;Left;20 reps;Supine Hip ABduction/ADduction: AAROM;Left;15 reps;Supine    General Comments        Pertinent Vitals/Pain Pain Assessment: 0-10 Pain Score: 5  Pain Location: L hip Pain Descriptors / Indicators: Aching;Sore Pain Intervention(s): Limited activity within patient's tolerance;Monitored during session;Premedicated before session (pt declines ice)    Home Living                      Prior Function            PT Goals (current goals can now be found in the care plan section) Acute Rehab PT Goals Patient Stated Goal: Back to work ASAP PT Goal Formulation: With patient Time For Goal Achievement: 08/27/17 Potential to Achieve Goals: Good Progress towards PT goals: Progressing toward goals    Frequency    7X/week      PT Plan Current plan remains appropriate    Co-evaluation              AM-PAC PT "6 Clicks" Daily Activity  Outcome Measure  Difficulty turning over in bed (including adjusting bedclothes, sheets and blankets)?: Unable Difficulty moving from lying on back to sitting on the side of the bed? : Unable Difficulty sitting down on and standing up from a chair with arms (e.g.,  wheelchair, bedside commode, etc,.)?: Unable Help needed moving to and from a bed to chair (including a wheelchair)?: A Lot Help needed walking in hospital room?: A Little Help needed climbing 3-5 steps with a railing? : A Little 6 Click Score: 11    End of Session Equipment Utilized During Treatment: Gait belt Activity Tolerance: Patient tolerated treatment well Patient left: in chair;with call bell/phone within reach;with chair alarm set Nurse Communication: Mobility status PT Visit Diagnosis: Unsteadiness on feet (R26.81);Difficulty in walking, not elsewhere classified (R26.2)     Time: 5945-8592 PT Time Calculation (min) (ACUTE ONLY): 23 min  Charges:  $Gait Training: 8-22 mins $Therapeutic Exercise: 8-22 mins                    G Codes:        Pg [phone]    Jose Kaufman 08/24/2017, 8:43 AM

## 2017-08-24 NOTE — Progress Notes (Signed)
Physical Therapy Treatment Patient Details Name: Jose Kaufman MRN: [id number] DOB: March 02, 1951 Today's Date: 08/24/2017    History of Present Illness Pt s/p L THR and with hx of R THR 4/18    PT Comments    Pt continues motivated and progressing with mobility but continues to require cueing to slow pace for safety.   Follow Up Recommendations  Home health PT     Equipment Recommendations  None recommended by PT    Recommendations for Other Services OT consult     Precautions / Restrictions Precautions Precautions: Fall Restrictions Weight Bearing Restrictions: No Other Position/Activity Restrictions: WBAT    Mobility  Bed Mobility               General bed mobility comments: Pt OOB and requests back to chair  Transfers Overall transfer level: Needs assistance Equipment used: Rolling walker (2 wheeled) Transfers: Sit to/from Stand Sit to Stand: Min guard         General transfer comment: cues for LE management and use of UEs to self assist  Ambulation/Gait Ambulation/Gait assistance: Min assist;Min guard Ambulation Distance (Feet): 200 Feet Assistive device: Rolling walker (2 wheeled) Gait Pattern/deviations: Step-to pattern;Step-through pattern;Decreased step length - right;Decreased step length - left;Shuffle;Trunk flexed Gait velocity: decr Gait velocity interpretation: Below normal speed for age/gender General Gait Details: cues for posture, position from RW, to increase stride length and initial sequence   Stairs            Wheelchair Mobility    Modified Rankin (Stroke Patients Only)       Balance                                            Cognition Arousal/Alertness: Awake/alert Behavior During Therapy: WFL for tasks assessed/performed Overall Cognitive Status: Within Functional Limits for tasks assessed                                        Exercises Total Joint Exercises Ankle  Circles/Pumps: AROM;Both;20 reps;Supine Quad Sets: AROM;Both;10 reps;Supine Heel Slides: AAROM;Left;20 reps;Supine Hip ABduction/ADduction: AAROM;Left;15 reps;Supine    General Comments        Pertinent Vitals/Pain Pain Assessment: 0-10 Pain Score: 6  Pain Location: L hip Pain Descriptors / Indicators: Aching;Sore Pain Intervention(s): Limited activity within patient's tolerance;Monitored during session;Premedicated before session;Ice applied    Home Living                      Prior Function            PT Goals (current goals can now be found in the care plan section) Acute Rehab PT Goals Patient Stated Goal: Back to work ASAP PT Goal Formulation: With patient Time For Goal Achievement: 08/27/17 Potential to Achieve Goals: Good Progress towards PT goals: Progressing toward goals    Frequency    7X/week      PT Plan Current plan remains appropriate    Co-evaluation              AM-PAC PT "6 Clicks" Daily Activity  Outcome Measure  Difficulty turning over in bed (including adjusting bedclothes, sheets and blankets)?: Unable Difficulty moving from lying on back to sitting on the side of the bed? : Unable Difficulty sitting down on and  standing up from a chair with arms (e.g., wheelchair, bedside commode, etc,.)?: Unable Help needed moving to and from a bed to chair (including a wheelchair)?: A Little Help needed walking in hospital room?: A Little Help needed climbing 3-5 steps with a railing? : A Little 6 Click Score: 12    End of Session Equipment Utilized During Treatment: Gait belt Activity Tolerance: Patient tolerated treatment well Patient left: in chair;with call bell/phone within reach;with chair alarm set Nurse Communication: Mobility status PT Visit Diagnosis: Unsteadiness on feet (R26.81);Difficulty in walking, not elsewhere classified (R26.2)     Time: 1440-1510 PT Time Calculation (min) (ACUTE ONLY): 30 min  Charges:  $Gait  Training: 8-22 mins $Therapeutic Exercise: 8-22 mins                    G Codes:       Pg [phone]    Marlyne Totaro 08/24/2017, 3:12 PM

## 2017-08-24 NOTE — Progress Notes (Signed)
Subjective: 1 Day Post-Op Procedure(s) (LRB): LEFT TOTAL HIP ARTHROPLASTY ANTERIOR APPROACH (Left) Patient reports pain as moderate.    Objective: Vital signs in last 24 hours: Temp:  [97.4 F (36.3 C)-98.2 F (36.8 C)] 97.8 F (36.6 C) (09/15 0545) Pulse Rate:  [25-86] 45 (09/15 0545) Resp:  [13-22] 16 (09/15 0545) BP: (75-127)/(37-71) 127/71 (09/15 0545) SpO2:  [98 %-100 %] 99 % (09/15 0545) FiO2 (%):  [32 %] 32 % (09/14 1230)  Intake/Output from previous day: 09/14 0701 - 09/15 0700 In: 4120 [P.O.:960; I.V.:3000; IV Piggyback:160] Out: 1300 [Urine:1000; Blood:300] Intake/Output this shift: No intake/output data recorded.   Recent Labs  08/24/17 0559  HGB 12.4*    Recent Labs  08/24/17 0559  WBC 17.3*  RBC 4.09*  HCT 37.1*  PLT 205    Recent Labs  08/24/17 0559  NA 137  K 4.4  CL 105  CO2 28  BUN 26*  CREATININE 1.27*  GLUCOSE 122*  CALCIUM 8.8*   No results for input(s): LABPT, INR in the last 72 hours.  Sensation intact distally Intact pulses distally Dorsiflexion/Plantar flexion intact Incision: dressing C/D/I  Assessment/Plan: 1 Day Post-Op Procedure(s) (LRB): LEFT TOTAL HIP ARTHROPLASTY ANTERIOR APPROACH (Left) Up with therapy Plan for discharge tomorrow Discharge home with home health  He has pain meds at home so he states that he does no need any on discharge.  Mcarthur Rossetti 08/24/2017, 9:03 AM

## 2017-08-24 NOTE — Progress Notes (Signed)
OT Cancellation Note  Patient Details Name: Jose Kaufman MRN: [id number] DOB: [birth year]   Cancelled Treatment:    Reason Eval/Treat Not Completed: OT screened, no needs identified, will sign off  [place], Thereasa Parkin 08/24/2017, 10:36 AM

## 2017-08-25 NOTE — Progress Notes (Signed)
   Subjective: 2 Days Post-Op Procedure(s) (LRB): LEFT TOTAL HIP ARTHROPLASTY ANTERIOR APPROACH (Left) Patient reports pain as mild.    Objective: Vital signs in last 24 hours: Temp:  [99 F (37.2 C)-99.7 F (37.6 C)] 99 F (37.2 C) (09/16 0515) Pulse Rate:  [91-94] 93 (09/16 0515) Resp:  [18-20] 20 (09/16 0515) BP: (127-142)/(67-81) 132/81 (09/16 0515) SpO2:  [98 %-100 %] 98 % (09/16 0515)  Intake/Output from previous day: 09/15 0701 - 09/16 0700 In: 960 [P.O.:960] Out: 1850 [Urine:1850] Intake/Output this shift: Total I/O In: 240 [P.O.:240] Out: 500 [Urine:500]   Recent Labs  08/24/17 0559  HGB 12.4*    Recent Labs  08/24/17 0559  WBC 17.3*  RBC 4.09*  HCT 37.1*  PLT 205    Recent Labs  08/24/17 0559  NA 137  K 4.4  CL 105  CO2 28  BUN 26*  CREATININE 1.27*  GLUCOSE 122*  CALCIUM 8.8*   No results for input(s): LABPT, INR in the last 72 hours.  Neurologically intact No results found.  Assessment/Plan: 2 Days Post-Op Procedure(s) (LRB): LEFT TOTAL HIP ARTHROPLASTY ANTERIOR APPROACH (Left) Up with therapy already. Discharge home.   Marybelle Killings 08/25/2017, 12:36 PM

## 2017-08-25 NOTE — Progress Notes (Signed)
Pt alert and oriented x 3. Pt in NAD. Skin Wnl. Cap refil < 3 seconds. Resp unlabored and even. Pt without further questions or concerns about discharge. Pt provided with gauze and paper tape in the event his dressing falls off. All instructions provided to patient. Pt wheeled out in wheelchair by tech.

## 2017-08-25 NOTE — Progress Notes (Signed)
Physical Therapy Treatment Patient Details Name: Jose Kaufman MRN: [id number] DOB: [birth year] Today's Date: 08/25/2017    History of Present Illness Pt s/p L THR and with hx of R THR 4/18    PT Comments    The patient is progressing well. PATIENT HAS MET GOALS, WOULD LIKE TO DC TODAY.    DC plan and follow up therapy as arranged by surgeon- none     Equipment Recommendations  None recommended by PT    Recommendations for Other Services       Precautions / Restrictions Precautions Precautions: Fall Restrictions Weight Bearing Restrictions: No    Mobility  Bed Mobility Overal bed mobility: Modified Independent       Supine to sit: HOB elevated        Transfers Overall transfer level: Needs assistance Equipment used: Rolling walker (2 wheeled) Transfers: Sit to/from Stand Sit to Stand: Min guard            Ambulation/Gait Ambulation/Gait assistance: Min guard Ambulation Distance (Feet): 150 Feet Assistive device: Rolling walker (2 wheeled) Gait Pattern/deviations: Step-to pattern     General Gait Details: cues for posture, position from RW, to increase stride length and initial sequence, cues for safe truning with RW, tends to tuen quickly, Left leg tends to be slightly IR,    Stairs Stairs: Yes   Stair Management: Two rails;Forwards Number of Stairs: 2 General stair comments: cues for safety and sequence  Wheelchair Mobility    Modified Rankin (Stroke Patients Only)       Balance                                            Cognition Arousal/Alertness: Awake/alert                                            Exercises Total Joint Exercises Ankle Circles/Pumps: AROM;Both;20 reps;Supine Quad Sets: AROM;Both;10 reps;Supine Short Arc Quad: AROM;Left;10 reps;Supine Heel Slides: Left;20 reps;Supine;AROM;10 reps Hip ABduction/ADduction: Left;Supine;AROM;10 reps Long Arc Quad: AROM;Left;10 reps;Supine     General Comments        Pertinent Vitals/Pain Pain Score: 3  Pain Location: L hip Pain Descriptors / Indicators: Sore Pain Intervention(s): Patient requesting pain meds-RN notified;Ice applied    Home Living                      Prior Function            PT Goals (current goals can now be found in the care plan section) Progress towards PT goals: Progressing toward goals    Frequency    7X/week      PT Plan      Co-evaluation              AM-PAC PT "6 Clicks" Daily Activity  Outcome Measure  Difficulty turning over in bed (including adjusting bedclothes, sheets and blankets)?: None Difficulty moving from lying on back to sitting on the side of the bed? : None Difficulty sitting down on and standing up from a chair with arms (e.g., wheelchair, bedside commode, etc,.)?: A Little Help needed moving to and from a bed to chair (including a wheelchair)?: A Little Help needed walking in hospital room?: A Little Help needed climbing 3-5 steps with  a railing? : A Little 6 Click Score: 20    End of Session   Activity Tolerance: Patient tolerated treatment well Patient left: in chair;with call bell/phone within reach;with chair alarm set Nurse Communication: Mobility status PT Visit Diagnosis: Unsteadiness on feet (R26.81);Difficulty in walking, not elsewhere classified (R26.2)     Time: 5366-4403 PT Time Calculation (min) (ACUTE ONLY): 28 min  Charges:  $Gait Training: 8-22 mins $Therapeutic Exercise: 8-22 mins                    G CodesTresa Endo PT [phone]   Claretha Cooper 08/25/2017, 10:58 AM

## 2017-08-26 ENCOUNTER — Telehealth (INDEPENDENT_AMBULATORY_CARE_PROVIDER_SITE_OTHER): Payer: Self-pay | Admitting: Radiology

## 2017-08-26 NOTE — Discharge Summary (Signed)
Patient ID: Calden Dorsey MRN: [id number] DOB/AGE: 1950-12-16 66 y.o.  Admit date: 08/23/2017 Discharge date: 08/26/2017  Admission Diagnoses:  Principal Problem:   Unilateral primary osteoarthritis, left hip Active Problems:   Status post total replacement of left hip   Discharge Diagnoses:  Same  Past Medical History:  Diagnosis Date  . Abnormality of gait 10/15/2016  . Arthritis   . Dysrhythmia    hx of  . Essential hypertension 03/14/2017  . PVCs (premature ventricular contractions) 03/14/2017    Surgeries: Procedure(s): LEFT TOTAL HIP ARTHROPLASTY ANTERIOR APPROACH on 08/23/2017   Consultants:   Discharged Condition: Improved  Hospital Course: Baraa Tubbs is an 66 y.o. male who was admitted 08/23/2017 for operative treatment ofUnilateral primary osteoarthritis, left hip. Patient has severe unremitting pain that affects sleep, daily activities, and work/hobbies. After pre-op clearance the patient was taken to the operating room on 08/23/2017 and underwent  Procedure(s): LEFT TOTAL HIP ARTHROPLASTY ANTERIOR APPROACH.    Patient was given perioperative antibiotics: Anti-infectives    Start     Dose/Rate Route Frequency Ordered Stop   08/23/17 1500  clindamycin (CLEOCIN) IVPB 600 mg     600 mg 100 mL/hr over 30 Minutes Intravenous Every 6 hours 08/23/17 1229 08/23/17 2159   08/23/17 0714  clindamycin (CLEOCIN) 900 MG/50ML IVPB    Comments:  [place] Session   : cabinet override      08/23/17 0714 08/23/17 0856   08/23/17 0710  clindamycin (CLEOCIN) IVPB 900 mg     900 mg 100 mL/hr over 30 Minutes Intravenous On call to O.R. 08/23/17 0710 08/23/17 0926       Patient was given sequential compression devices, early ambulation, and chemoprophylaxis to prevent DVT.  Patient benefited maximally from hospital stay and there were no complications.    Recent vital signs: No data found.    Recent laboratory studies:  Recent Labs  08/24/17 0559  WBC 17.3*  HGB 12.4*  HCT  37.1*  PLT 205  NA 137  K 4.4  CL 105  CO2 28  BUN 26*  CREATININE 1.27*  GLUCOSE 122*  CALCIUM 8.8*     Discharge Medications:   Allergies as of 08/25/2017      Reactions   Penicillins Other (See Comments)   Childhood reaction. Has patient had a PCN reaction causing immediate rash, facial/tongue/throat swelling, SOB or lightheadedness with hypotension: unsure Has patient had a PCN reaction causing severe rash involving mucus membranes or skin necrosis: unsure Has patient had a PCN reaction that required hospitalization:No Has patient had a PCN reaction occurring within the last 10 years:No If all of the above answers are "NO", then may proceed with Cephalosporin use      Medication List    TAKE these medications   acetaminophen 325 MG tablet Commonly known as:  TYLENOL Take 325 mg by mouth every 6 (six) hours as needed for mild pain.   FISH OIL PO Take 1 capsule by mouth every evening.   lisinopril-hydrochlorothiazide 10-12.5 MG tablet Commonly known as:  PRINZIDE,ZESTORETIC Take 1 tablet by mouth daily.   silver sulfADIAZINE 1 % cream Commonly known as:  SILVADENE Apply 1 application topically daily as needed (rash). Applied topically to affected area of skin after bathing.       Diagnostic Studies: Dg Pelvis Portable  Result Date: 08/23/2017 CLINICAL DATA:  Left hip replacement . EXAM: PORTABLE PELVIS 1-2 VIEWS COMPARISON:  03/22/2017. FINDINGS: Total left hip replacement. Hardware intact. Anatomic alignment. Prior right hip replacement . Hardware  intact. No acute bony abnormality identified. No evidence of fracture or dislocation. Foley catheter noted. IMPRESSION: Total left hip replacement with anatomic alignment. Electronically Signed   By: Marcello Moores  Register   On: 08/23/2017 11:02   Dg C-arm 1-60 Min-no Report  Result Date: 08/23/2017 Fluoroscopy was utilized by the requesting physician.  No radiographic interpretation.   Dg Hip Operative Unilat W Or W/o  Pelvis Left  Result Date: 08/23/2017 CLINICAL DATA:  Intraoperative left anterior hip replacement EXAM: OPERATIVE LEFT HIP (WITH PELVIS IF PERFORMED)  VIEWS TECHNIQUE: Fluoroscopic spot image(s) were submitted for interpretation post-operatively. COMPARISON:  03/22/2017 FINDINGS: Remote changes of right hip replacement partially imaged. New left hip replacement. No hardware bony complicating feature. Normal AP alignment. IMPRESSION: Interval left hip replacement.  No visible complicating feature. Electronically Signed   By: Rolm Baptise M.D.   On: 08/23/2017 10:19    Disposition: 01-Home or Self Care    Follow-up Information    Mcarthur Rossetti, MD Follow up in 2 week(s).   Specialty:  Orthopedic Surgery Contact information: [name] [address] [phone]        [facility] Follow up.   Specialty:  [place] Why:  [place] will call to arrange initial visit Contact information: [name] [address] [phone]            Signed: Erskine Emery 08/26/2017, 12:57 PM

## 2017-08-26 NOTE — Telephone Encounter (Signed)
Katelyn from Physical Therapy is requesting verbal orders from Dr. Ninfa Linden.  PT 3 times a week for 2 weeks.  CB# [phone]

## 2017-08-27 NOTE — Telephone Encounter (Signed)
Verbal order given to [facility]

## 2017-09-05 ENCOUNTER — Ambulatory Visit (INDEPENDENT_AMBULATORY_CARE_PROVIDER_SITE_OTHER): Payer: BLUE CROSS/BLUE SHIELD | Admitting: Orthopaedic Surgery

## 2017-09-05 ENCOUNTER — Telehealth (INDEPENDENT_AMBULATORY_CARE_PROVIDER_SITE_OTHER): Payer: Self-pay | Admitting: Orthopaedic Surgery

## 2017-09-05 DIAGNOSIS — Z96642 Presence of left artificial hip joint: Secondary | ICD-10-CM

## 2017-09-05 DIAGNOSIS — Z96641 Presence of right artificial hip joint: Secondary | ICD-10-CM

## 2017-09-05 NOTE — Telephone Encounter (Signed)
09/05/2017 work note emailed to employer [employer] [email or web address], per patients request.

## 2017-09-05 NOTE — Progress Notes (Signed)
The patient is 2 weeks tomorrow status post a left total hip arthroplasty through direct anterior approach. Earlier this year his right hip replaced as well. He is walking barely using his cane because is doing so well he states. He says his leg lengths are equal. He has no significant complaints. He is not taking any pain medications either.  On exam his incision looks great. I placed knee Steri-Strips. He does have a moderate seroma and I was able to drain about 80 mL of fluid around the incision itself. There was no evidence of infection at all. He says is not having any pain at all.  Would like a note given him to allow him to go back to work this coming Monday, October 1. I agree with that as well. He can drop from my standpoint. I will see him back in 4 weeks to see how is doing overall. All questions and concerns were answered and addressed.

## 2017-10-03 ENCOUNTER — Ambulatory Visit (INDEPENDENT_AMBULATORY_CARE_PROVIDER_SITE_OTHER): Payer: BLUE CROSS/BLUE SHIELD | Admitting: Orthopaedic Surgery

## 2017-10-07 ENCOUNTER — Encounter (INDEPENDENT_AMBULATORY_CARE_PROVIDER_SITE_OTHER): Payer: Self-pay | Admitting: Physician Assistant

## 2017-10-07 ENCOUNTER — Ambulatory Visit (INDEPENDENT_AMBULATORY_CARE_PROVIDER_SITE_OTHER): Payer: BLUE CROSS/BLUE SHIELD | Admitting: Physician Assistant

## 2017-10-07 DIAGNOSIS — M1612 Unilateral primary osteoarthritis, left hip: Secondary | ICD-10-CM

## 2017-10-07 NOTE — Progress Notes (Signed)
Mr. Reichenberger returns today now 45 days status post left total hip arthroplasty.  He states overall he is doing well.  He does note some numbness over the incision site area but overall doing well.  He is back at work.  Had no chest pain shortness breath fevers chills.  Physical exam: Left hip good range of motion with internal/external rotation.  Surgical incisions healing well.  Calf supple nontender.  Dorsiflexion plantarflexion of the left ankle intact.  He walks without any assistive device.  When first beginning to ambulate he does have a slight antalgic gait but this resolves quickly.  Impression: Status post left total hip arthroplasty 08/23/2017  Status post right total hip arthroplasty 03/22/2017  Plan: He will continue work on strengthening of the left hip gait and balance.  The follow-up with [place] in July 2019 obtain an AP pelvis and lateral view of both hips.  He can always return sooner if he has any questions or concerns.  Questions were encouraged and answered at length today.

## 2018-07-07 ENCOUNTER — Ambulatory Visit (INDEPENDENT_AMBULATORY_CARE_PROVIDER_SITE_OTHER): Payer: Self-pay

## 2018-07-07 ENCOUNTER — Encounter (INDEPENDENT_AMBULATORY_CARE_PROVIDER_SITE_OTHER): Payer: Self-pay | Admitting: Physician Assistant

## 2018-07-07 ENCOUNTER — Ambulatory Visit (INDEPENDENT_AMBULATORY_CARE_PROVIDER_SITE_OTHER): Payer: Commercial Managed Care - PPO | Admitting: Physician Assistant

## 2018-07-07 DIAGNOSIS — Z96643 Presence of artificial hip joint, bilateral: Secondary | ICD-10-CM | POA: Diagnosis not present

## 2018-07-07 NOTE — Progress Notes (Signed)
HPI: Jose Kaufman returns today 10 months status post left total hip arthroplasty.  He is also 80-month status post right total hip arthroplasty.  States he has some stiffness mostly in the right hip at times but is still much better than it was preop.  Has no pain in either hip.  Said no fevers chills shortness of breath chest pain.  States both incisions well-healed.  Physical exam: Bilateral hips he does have some stiffness with external rotation.  Tight hamstrings bilaterally.  Radiographs: AP pelvis lateral view of both hips shows well-seated total hip arthroplasties.  Both hips are well located.  No bony abnormalities.  Impression:Status Post left total hip arthroplasty 10 months:  Plan: He will work on hamstring stretching exercises as shown today.  Follow-up with [place] on as-needed basis.  Encouraged and answered.

## 2019-05-05 ENCOUNTER — Emergency Department (HOSPITAL_COMMUNITY): Payer: Commercial Managed Care - PPO

## 2019-05-05 ENCOUNTER — Encounter (HOSPITAL_COMMUNITY): Payer: Self-pay

## 2019-05-05 ENCOUNTER — Other Ambulatory Visit: Payer: Self-pay

## 2019-05-05 ENCOUNTER — Inpatient Hospital Stay (HOSPITAL_COMMUNITY)
Admission: EM | Admit: 2019-05-05 | Discharge: 2019-05-14 | DRG: 035 | Disposition: A | Discharge status: Rehabilitation Facility | Payer: Commercial Managed Care - PPO | Attending: Internal Medicine | Admitting: Internal Medicine

## 2019-05-05 ENCOUNTER — Inpatient Hospital Stay (HOSPITAL_COMMUNITY): Payer: Commercial Managed Care - PPO

## 2019-05-05 DIAGNOSIS — N179 Acute kidney failure, unspecified: Secondary | ICD-10-CM | POA: Diagnosis present

## 2019-05-05 DIAGNOSIS — I69351 Hemiplegia and hemiparesis following cerebral infarction affecting right dominant side: Secondary | ICD-10-CM | POA: Diagnosis not present

## 2019-05-05 DIAGNOSIS — R4702 Dysphasia: Secondary | ICD-10-CM | POA: Diagnosis present

## 2019-05-05 DIAGNOSIS — Z79899 Other long term (current) drug therapy: Secondary | ICD-10-CM | POA: Diagnosis not present

## 2019-05-05 DIAGNOSIS — I6523 Occlusion and stenosis of bilateral carotid arteries: Secondary | ICD-10-CM | POA: Diagnosis present

## 2019-05-05 DIAGNOSIS — R4701 Aphasia: Secondary | ICD-10-CM | POA: Diagnosis present

## 2019-05-05 DIAGNOSIS — I6389 Other cerebral infarction: Secondary | ICD-10-CM | POA: Diagnosis not present

## 2019-05-05 DIAGNOSIS — Z23 Encounter for immunization: Secondary | ICD-10-CM | POA: Diagnosis not present

## 2019-05-05 DIAGNOSIS — R29703 NIHSS score 3: Secondary | ICD-10-CM | POA: Diagnosis present

## 2019-05-05 DIAGNOSIS — I639 Cerebral infarction, unspecified: Secondary | ICD-10-CM

## 2019-05-05 DIAGNOSIS — G8191 Hemiplegia, unspecified affecting right dominant side: Secondary | ICD-10-CM | POA: Diagnosis not present

## 2019-05-05 DIAGNOSIS — R29810 Facial weakness: Secondary | ICD-10-CM | POA: Diagnosis present

## 2019-05-05 DIAGNOSIS — Z1159 Encounter for screening for other viral diseases: Secondary | ICD-10-CM | POA: Diagnosis not present

## 2019-05-05 DIAGNOSIS — Z20822 Contact with and (suspected) exposure to covid-19: Secondary | ICD-10-CM | POA: Insufficient documentation

## 2019-05-05 DIAGNOSIS — I69320 Aphasia following cerebral infarction: Secondary | ICD-10-CM | POA: Diagnosis not present

## 2019-05-05 DIAGNOSIS — R4781 Slurred speech: Secondary | ICD-10-CM | POA: Diagnosis present

## 2019-05-05 DIAGNOSIS — E785 Hyperlipidemia, unspecified: Secondary | ICD-10-CM | POA: Diagnosis present

## 2019-05-05 DIAGNOSIS — Z85828 Personal history of other malignant neoplasm of skin: Secondary | ICD-10-CM | POA: Diagnosis not present

## 2019-05-05 DIAGNOSIS — I493 Ventricular premature depolarization: Secondary | ICD-10-CM | POA: Diagnosis present

## 2019-05-05 DIAGNOSIS — I6501 Occlusion and stenosis of right vertebral artery: Secondary | ICD-10-CM | POA: Diagnosis present

## 2019-05-05 DIAGNOSIS — I10 Essential (primary) hypertension: Secondary | ICD-10-CM | POA: Diagnosis present

## 2019-05-05 DIAGNOSIS — I771 Stricture of artery: Secondary | ICD-10-CM

## 2019-05-05 DIAGNOSIS — Z87891 Personal history of nicotine dependence: Secondary | ICD-10-CM

## 2019-05-05 DIAGNOSIS — Z8673 Personal history of transient ischemic attack (TIA), and cerebral infarction without residual deficits: Secondary | ICD-10-CM

## 2019-05-05 DIAGNOSIS — E860 Dehydration: Secondary | ICD-10-CM | POA: Diagnosis present

## 2019-05-05 DIAGNOSIS — I63512 Cerebral infarction due to unspecified occlusion or stenosis of left middle cerebral artery: Secondary | ICD-10-CM | POA: Diagnosis not present

## 2019-05-05 DIAGNOSIS — Z96643 Presence of artificial hip joint, bilateral: Secondary | ICD-10-CM | POA: Diagnosis present

## 2019-05-05 DIAGNOSIS — Z7902 Long term (current) use of antithrombotics/antiplatelets: Secondary | ICD-10-CM | POA: Diagnosis not present

## 2019-05-05 DIAGNOSIS — I959 Hypotension, unspecified: Secondary | ICD-10-CM | POA: Diagnosis present

## 2019-05-05 DIAGNOSIS — Z7982 Long term (current) use of aspirin: Secondary | ICD-10-CM | POA: Diagnosis not present

## 2019-05-05 DIAGNOSIS — I6602 Occlusion and stenosis of left middle cerebral artery: Secondary | ICD-10-CM | POA: Diagnosis not present

## 2019-05-05 DIAGNOSIS — I6529 Occlusion and stenosis of unspecified carotid artery: Secondary | ICD-10-CM

## 2019-05-05 DIAGNOSIS — I69398 Other sequelae of cerebral infarction: Secondary | ICD-10-CM | POA: Diagnosis present

## 2019-05-05 LAB — CBC
HCT: 46.4 % (ref 39.0–52.0)
Hemoglobin: 15.4 g/dL (ref 13.0–17.0)
MCH: 31.6 pg (ref 26.0–34.0)
MCHC: 33.2 g/dL (ref 30.0–36.0)
MCV: 95.1 fL (ref 80.0–100.0)
Platelets: 216 10*3/uL (ref 150–400)
RBC: 4.88 MIL/uL (ref 4.22–5.81)
RDW: 12.5 % (ref 11.5–15.5)
WBC: 8.6 10*3/uL (ref 4.0–10.5)
nRBC: 0 % (ref 0.0–0.2)

## 2019-05-05 LAB — APTT: aPTT: 34 seconds (ref 24–36)

## 2019-05-05 LAB — URINALYSIS, ROUTINE W REFLEX MICROSCOPIC
Bilirubin Urine: NEGATIVE
Glucose, UA: NEGATIVE mg/dL
Ketones, ur: NEGATIVE mg/dL
Leukocytes,Ua: NEGATIVE
Nitrite: NEGATIVE
Protein, ur: NEGATIVE mg/dL
Specific Gravity, Urine: 1.009 (ref 1.005–1.030)
pH: 6 (ref 5.0–8.0)

## 2019-05-05 LAB — COMPREHENSIVE METABOLIC PANEL
ALT: 20 U/L (ref 0–44)
AST: 19 U/L (ref 15–41)
Albumin: 4.7 g/dL (ref 3.5–5.0)
Alkaline Phosphatase: 74 U/L (ref 38–126)
Anion gap: 7 (ref 5–15)
BUN: 34 mg/dL — ABNORMAL HIGH (ref 8–23)
CO2: 26 mmol/L (ref 22–32)
Calcium: 9.3 mg/dL (ref 8.9–10.3)
Chloride: 103 mmol/L (ref 98–111)
Creatinine, Ser: 1.56 mg/dL — ABNORMAL HIGH (ref 0.61–1.24)
GFR calc Af Amer: 53 mL/min — ABNORMAL LOW (ref >60)
GFR calc non Af Amer: 46 mL/min — ABNORMAL LOW (ref >60)
Glucose, Bld: 103 mg/dL — ABNORMAL HIGH (ref 70–99)
Potassium: 3.8 mmol/L (ref 3.5–5.1)
Sodium: 136 mmol/L (ref 135–145)
Total Bilirubin: 0.8 mg/dL (ref 0.3–1.2)
Total Protein: 8 g/dL (ref 6.5–8.1)

## 2019-05-05 LAB — ECHOCARDIOGRAM COMPLETE
Height: 68 in
Weight: 2720 oz

## 2019-05-05 LAB — DIFFERENTIAL
Abs Immature Granulocytes: 0.02 10*3/uL (ref 0.00–0.07)
Basophils Absolute: 0.1 10*3/uL (ref 0.0–0.1)
Basophils Relative: 1 %
Eosinophils Absolute: 0.1 10*3/uL (ref 0.0–0.5)
Eosinophils Relative: 1 %
Immature Granulocytes: 0 %
Lymphocytes Relative: 13 %
Lymphs Abs: 1.1 10*3/uL (ref 0.7–4.0)
Monocytes Absolute: 0.6 10*3/uL (ref 0.1–1.0)
Monocytes Relative: 7 %
Neutro Abs: 6.8 10*3/uL (ref 1.7–7.7)
Neutrophils Relative %: 78 %

## 2019-05-05 LAB — RAPID URINE DRUG SCREEN, HOSP PERFORMED
Amphetamines: NOT DETECTED
Barbiturates: NOT DETECTED
Benzodiazepines: NOT DETECTED
Cocaine: NOT DETECTED
Opiates: NOT DETECTED
Tetrahydrocannabinol: NOT DETECTED

## 2019-05-05 LAB — PROTIME-INR
INR: 1.1 (ref 0.8–1.2)
Prothrombin Time: 14.1 seconds (ref 11.4–15.2)

## 2019-05-05 LAB — ETHANOL: Alcohol, Ethyl (B): <10 mg/dL (ref <10)

## 2019-05-05 LAB — SARS CORONAVIRUS 2 BY RT PCR (HOSPITAL ORDER, PERFORMED IN ~~LOC~~ HOSPITAL LAB): SARS Coronavirus 2: NEGATIVE

## 2019-05-05 LAB — MAGNESIUM: Magnesium: 2.2 mg/dL (ref 1.7–2.4)

## 2019-05-05 IMAGING — CT CT HEAD WITHOUT CONTRAST
3 series · 15 of 47 positions shown, 18 images · non-contrast
Comparison: None.

CLINICAL DATA: Confusion and speech disturbance beginning 2 days
ago.

EXAM:
CT HEAD WITHOUT CONTRAST
TECHNIQUE: Contiguous axial images were obtained from the base of the skull
through the vertex without intravenous contrast.

[Series 2: head wo · axial · 0.48mm/px · z∈[-60,+65]mm · 9 of 31 slices shown, 12 images]
[im 3/31  brain]
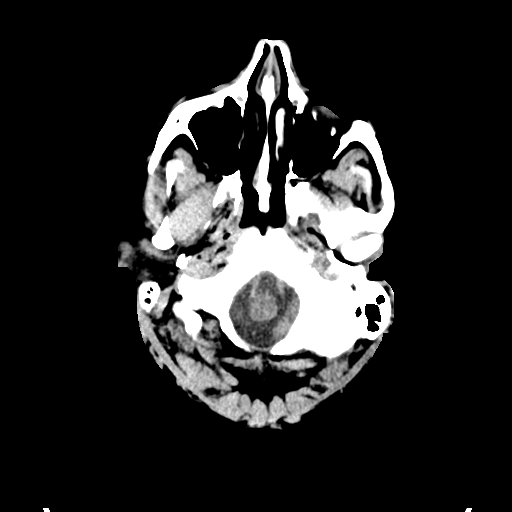
[im 3/31  bone]
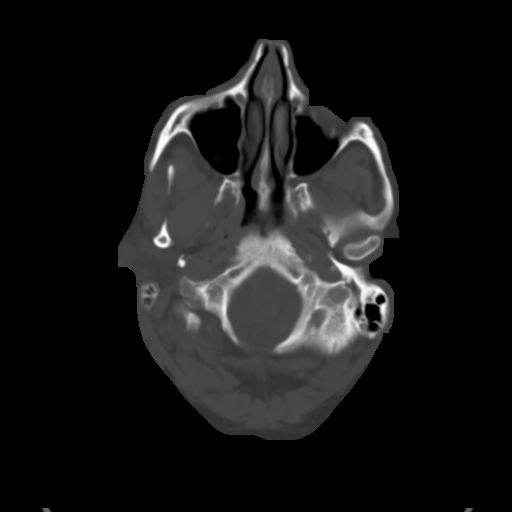
[im 6/31  brain]
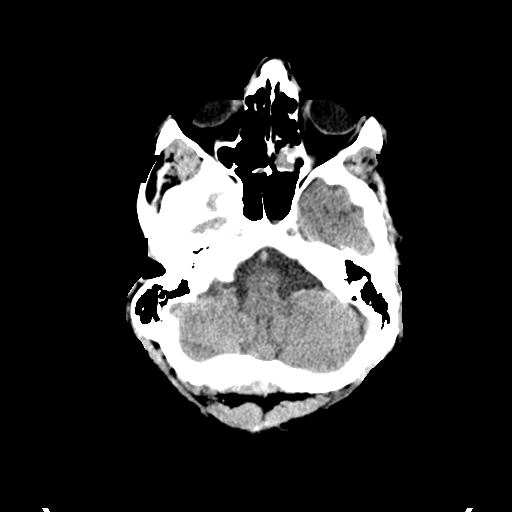
[im 9/31  brain]
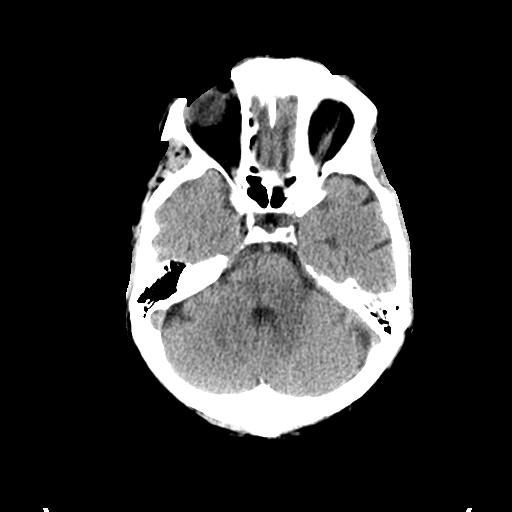
[im 12/31  brain]
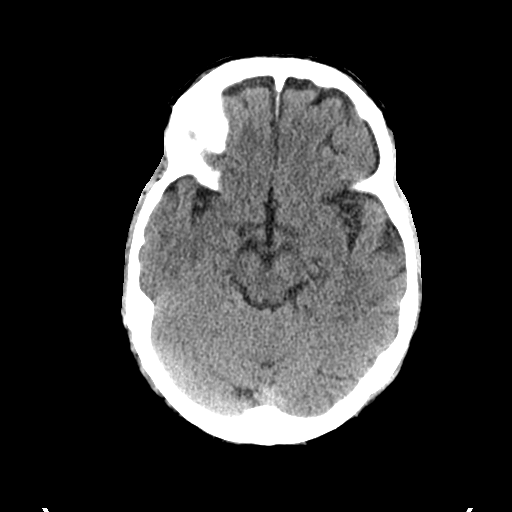
[im 16/31  brain]
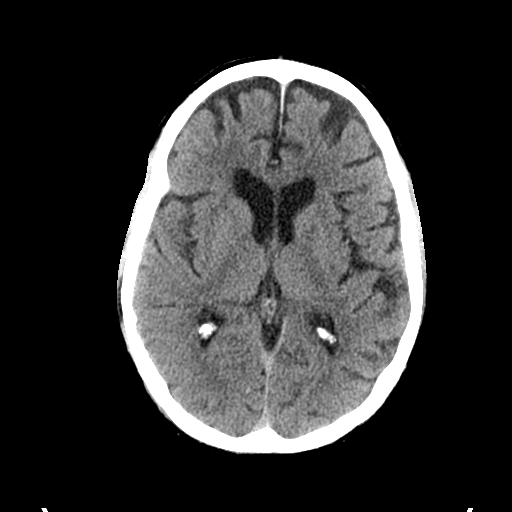
[im 16/31  bone]
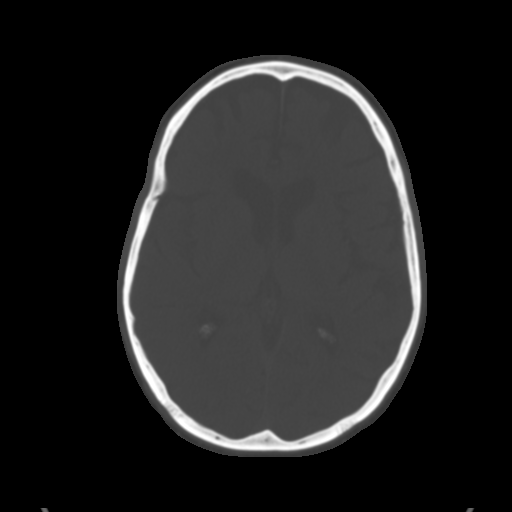
[im 19/31  brain]
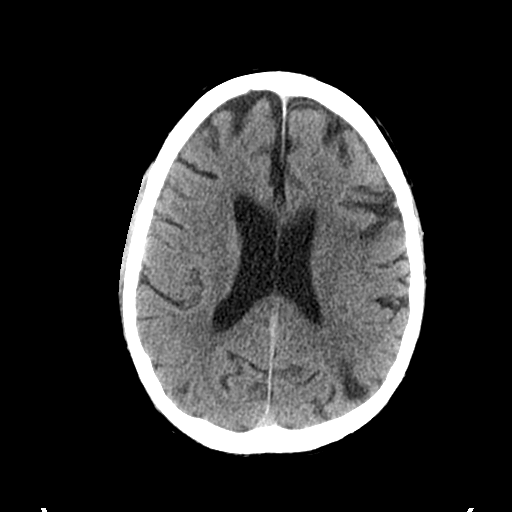
[im 22/31  brain]
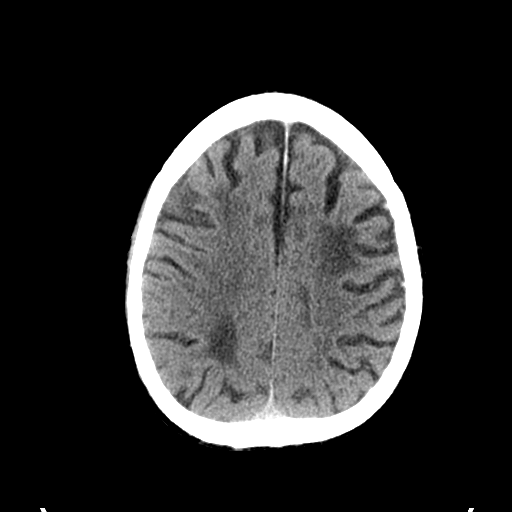
[im 25/31  brain]
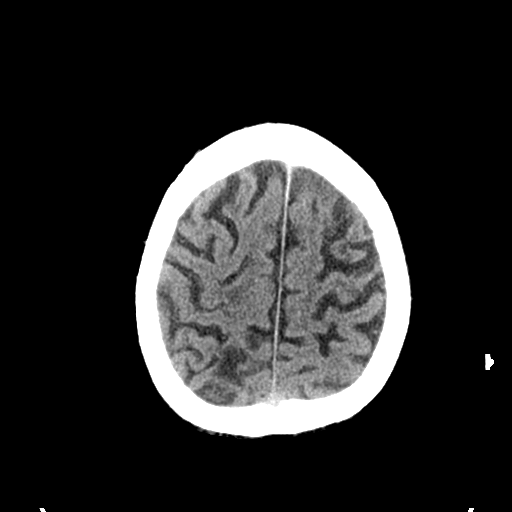
[im 28/31  brain]
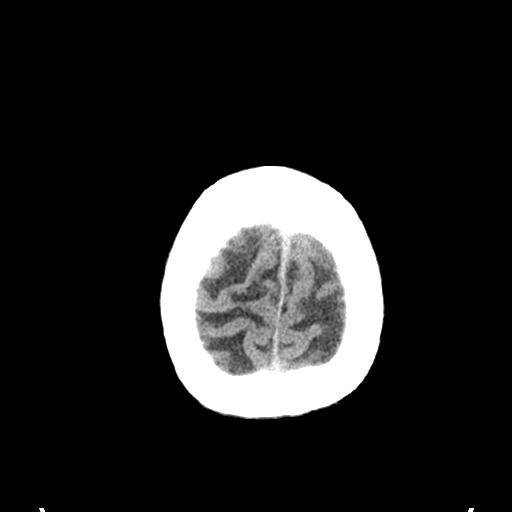
[im 28/31  bone]
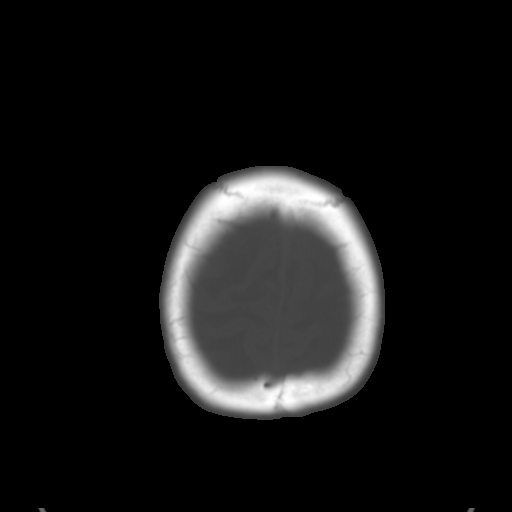

[Series 4: coronal soft tissue · coronal · 0.42mm/px · 3 of 66 slices shown]
[im 22/66  brain]
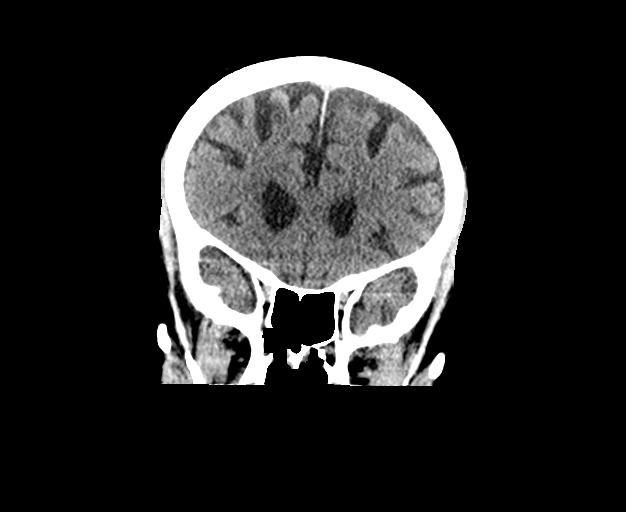
[im 29/66  brain]
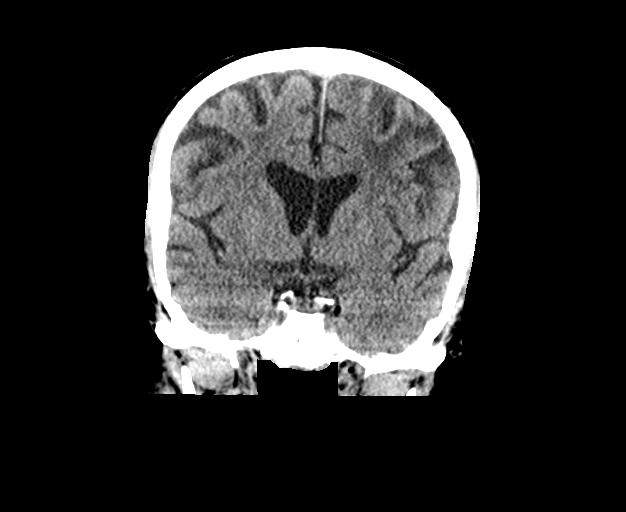
[im 37/66  brain]
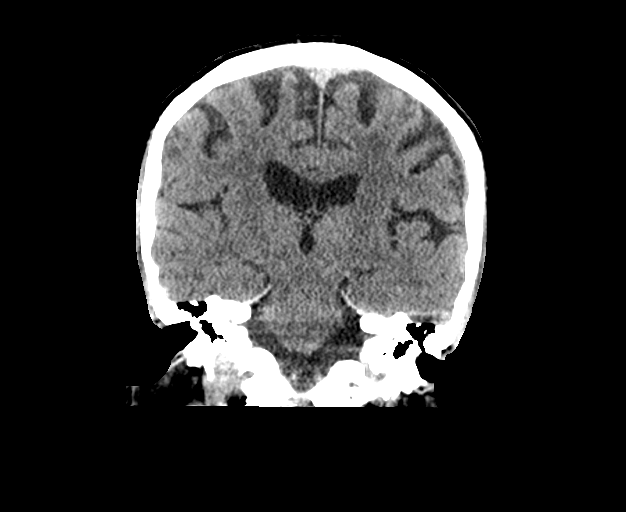

[Series 5: sagittal soft tissue · sagittal · 0.40mm/px · 3 of 52 slices shown]
[im 18/52  brain]
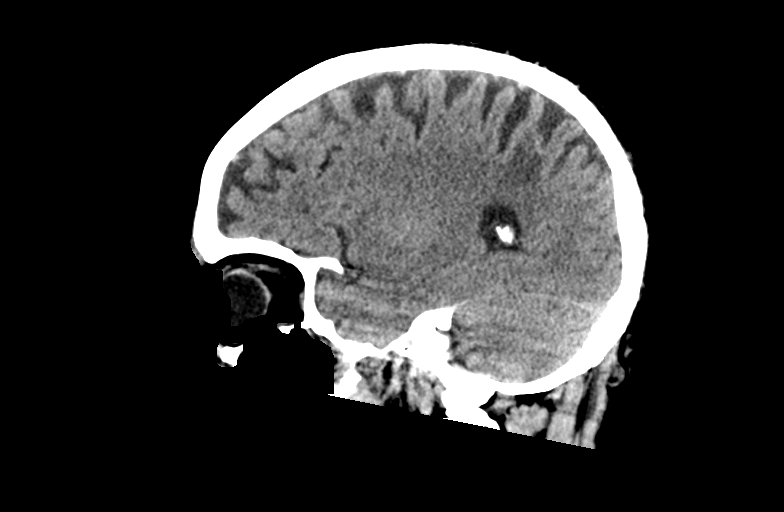
[im 26/52  brain]
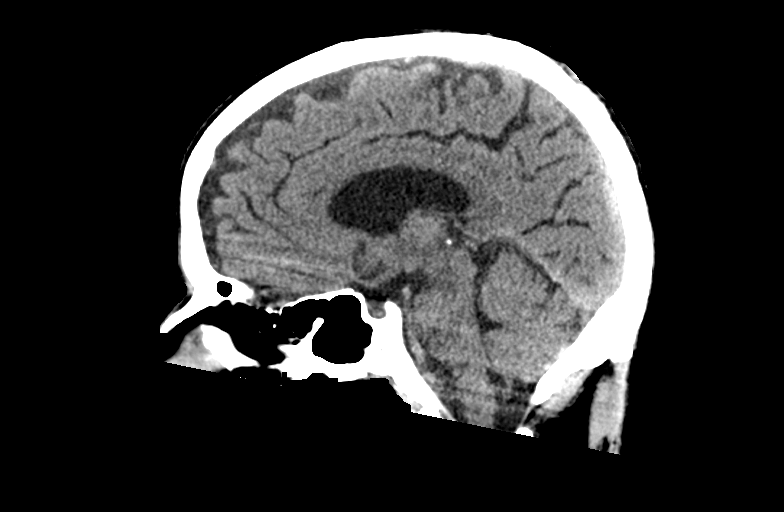
[im 35/52  brain]
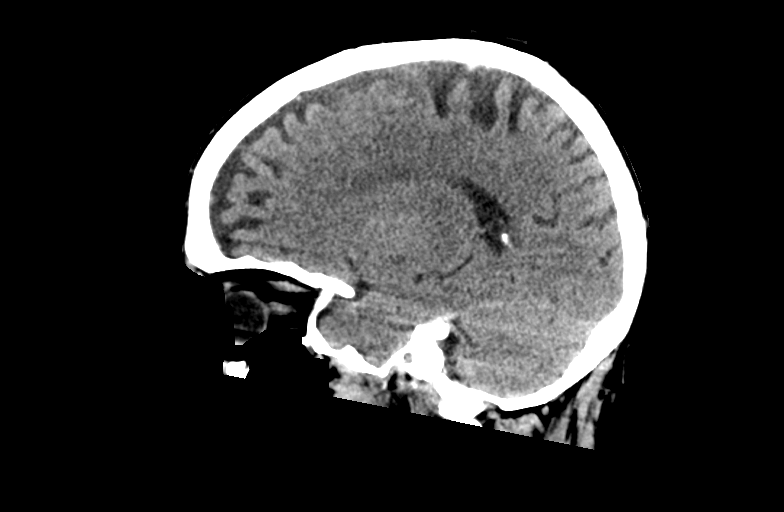

[15 of 47 positions shown; findings below may reference images not displayed]

FINDINGS: Brain: No acute finding by CT. The brainstem and cerebellum appear
normal. There are old appearing cortical and subcortical infarctions
in the frontal and parietal regions of both hemispheres. None of
these are discernibly recent. No mass lesion, hemorrhage,
hydrocephalus or extra-axial collection.

Vascular: No abnormal vascular finding.

Skull: Negative

Sinuses/Orbits: Scattered retention cysts. No sign of acute
sinusitis. Orbits negative.

Other: None
IMPRESSION: No acute finding by CT. Old bilateral frontal and parietal cortical
and subcortical infarctions.

## 2019-05-05 IMAGING — DX PORTABLE CHEST - 1 VIEW
1 series · 1 of 1 positions shown · non-contrast
Comparison: [DATE]

CLINICAL DATA: Mental status changes

EXAM:
PORTABLE CHEST 1 VIEW

[chest ap]
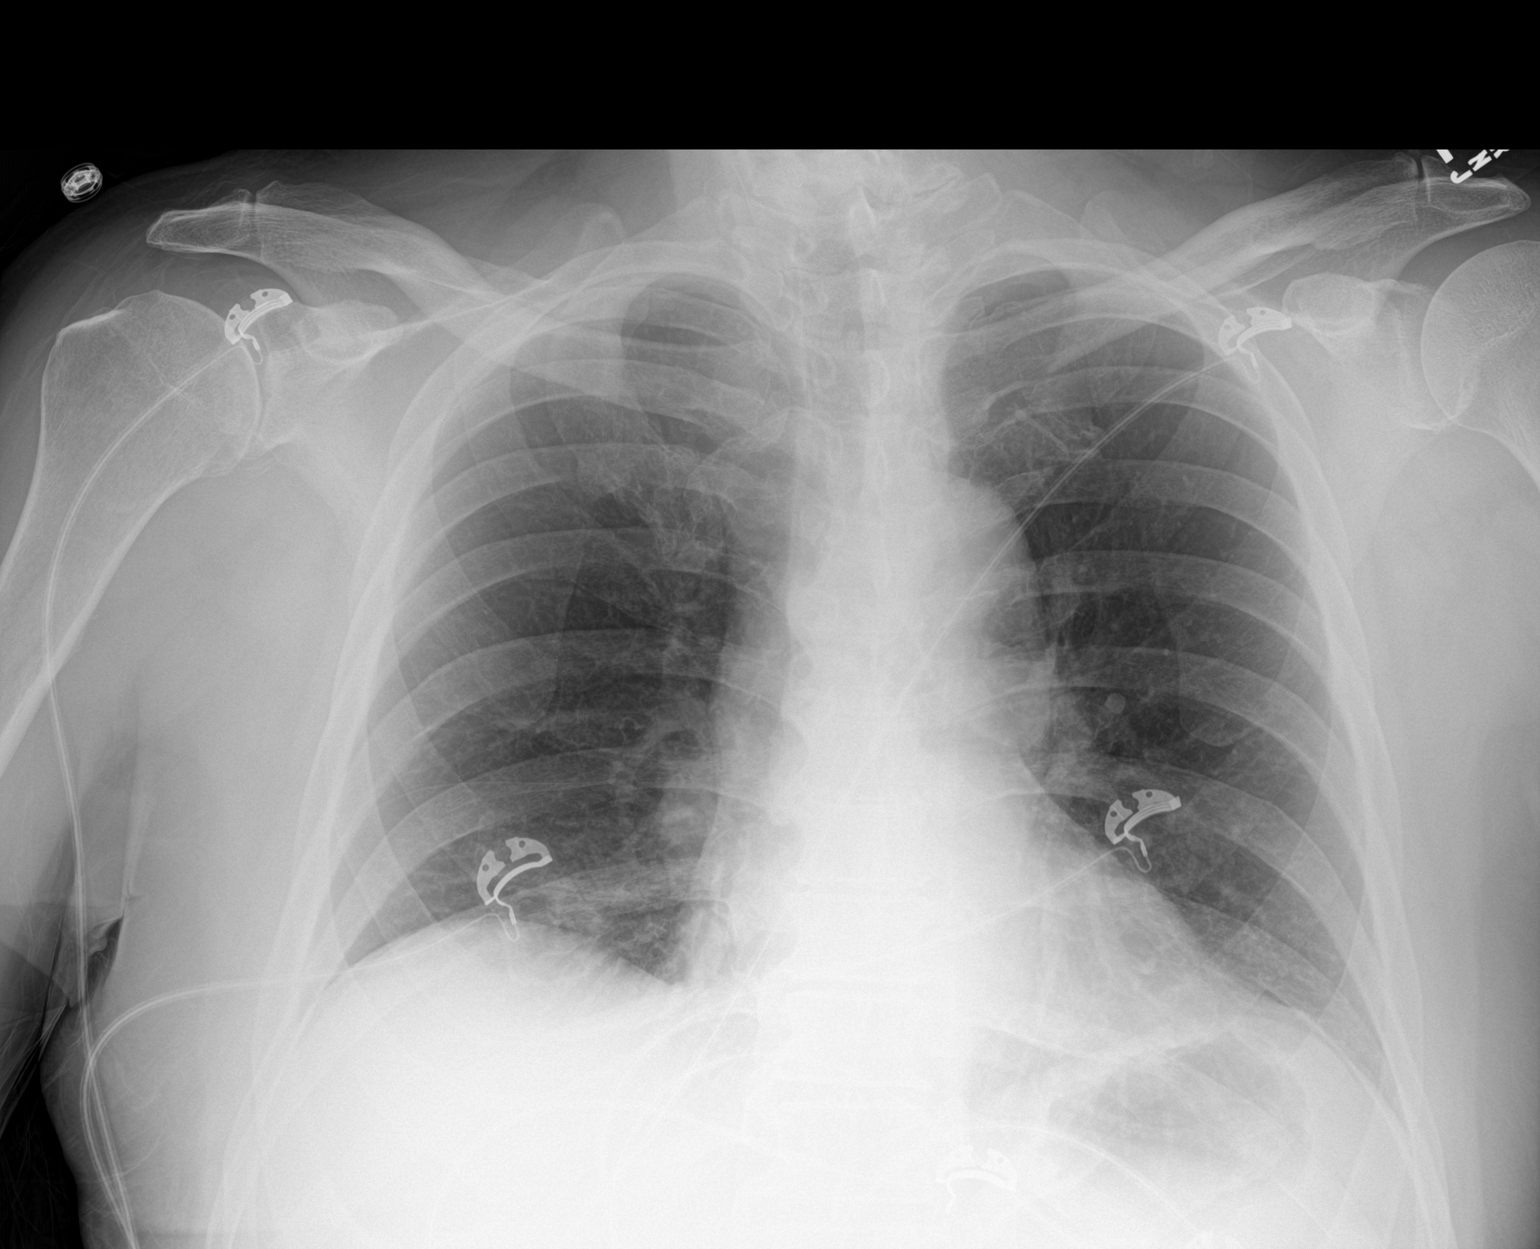

[1 of 1 positions shown; findings below may reference images not displayed]

FINDINGS: Bibasilar opacities, likely atelectasis. Heart is normal size. No
effusions. No acute bony abnormality.
IMPRESSION: Bibasilar airspace opacities, atelectasis versus early infiltrates.

## 2019-05-05 IMAGING — MR MRI HEAD WITHOUT CONTRAST
11 of 12 series · 39 of 48 positions shown · non-contrast
Comparison: Head CT same day

CLINICAL DATA: Confusion. Speech disturbance. Old strokes. Assess
for acute finding. Abnormal head CT.

EXAM:
MRI HEAD WITHOUT CONTRAST
TECHNIQUE: Multiplanar, multiecho pulse sequences of the brain and surrounding
structures were obtained without intravenous contrast.

[Series 3: DWI · axial · 3.0mm · 1.09mm/px · z∈[-4,+142]mm · 8 of 100 slices shown (1 of 6)]
[im 1/100]
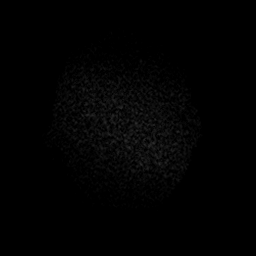
[im 15/100]
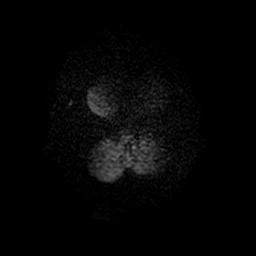
[im 29/100]
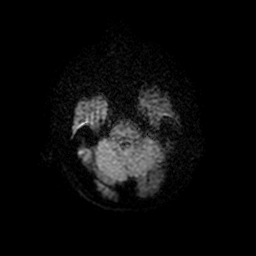
[im 43/100]
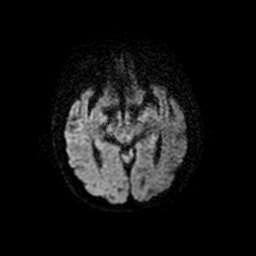
[im 57/100]
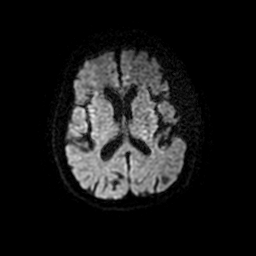
[im 71/100]
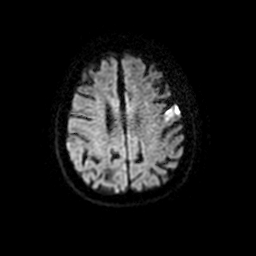
[im 85/100]
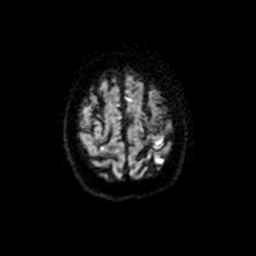
[im 100/100]
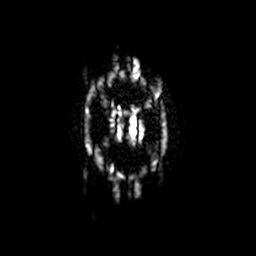

[Series 4: T1 · sagittal · 5.0mm · 0.47mm/px · 2 of 22 slices shown]
[im 1/22]
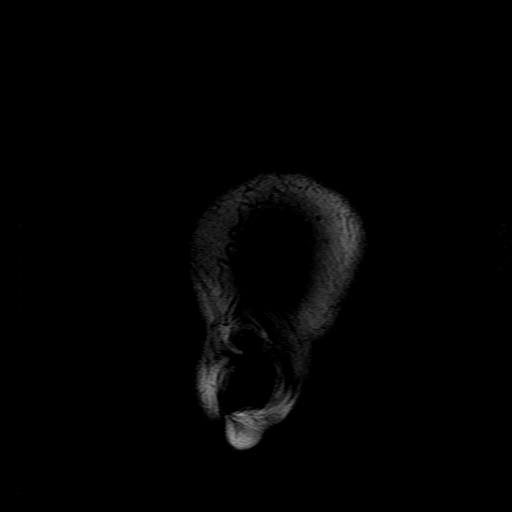
[im 22/22]
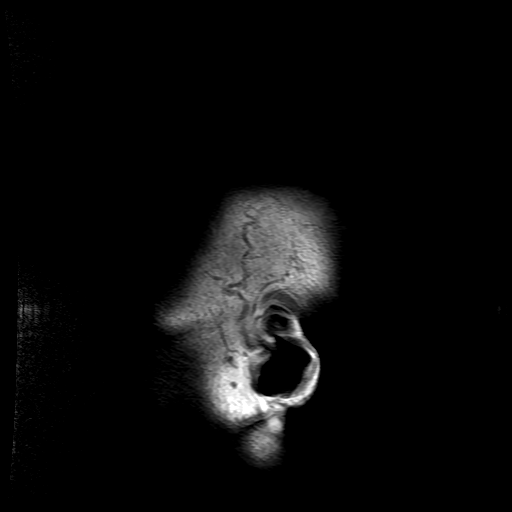

[Series 5: FLAIR · axial · 3.0mm · 0.43mm/px · z∈[-1,+148]mm · 2 of 26 slices shown]
[im 1/26]
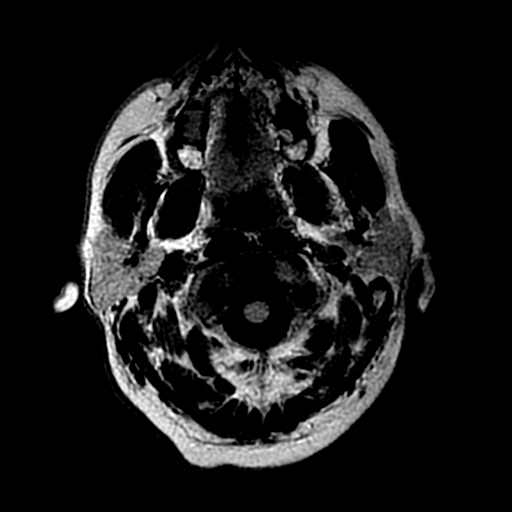
[im 26/26]
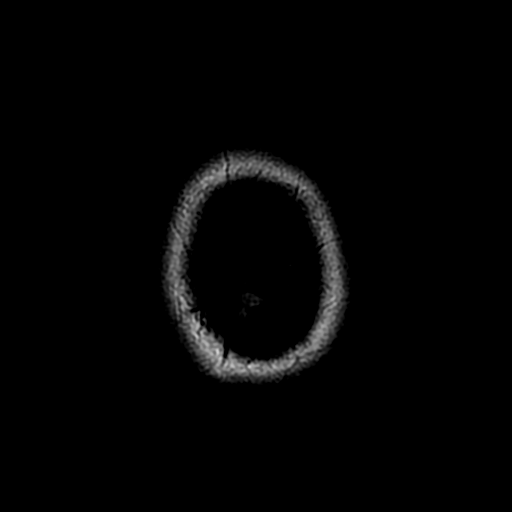

[Series 6: T2 · axial · 5.0mm · 0.47mm/px · z∈[-0,+149]mm · 2 of 26 slices shown (1 of 2)]
[im 1/26]
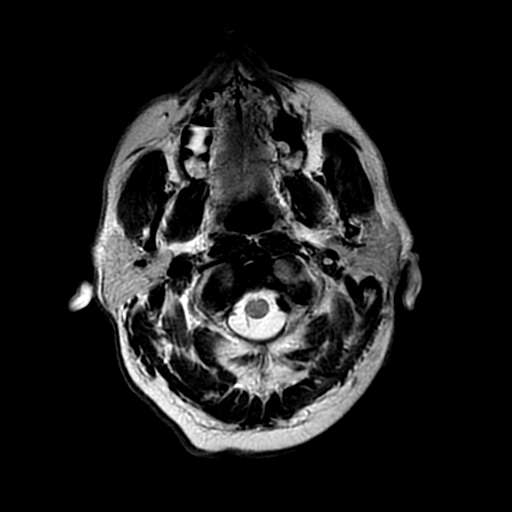
[im 26/26]
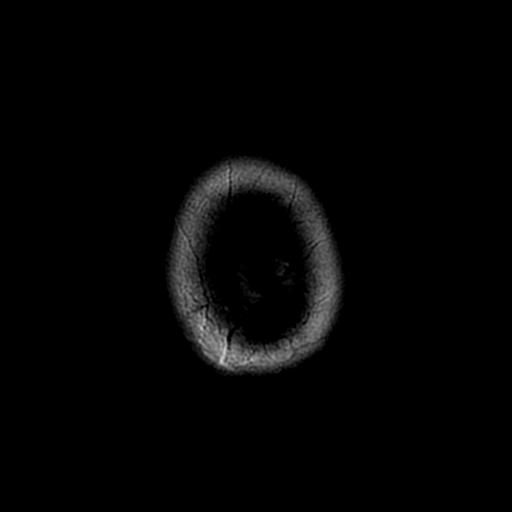

[Series 7: ax mpgr · axial · 3.0mm · 0.43mm/px · z∈[-9,+66]mm · 2 of 32 slices shown]
[im 1/32]
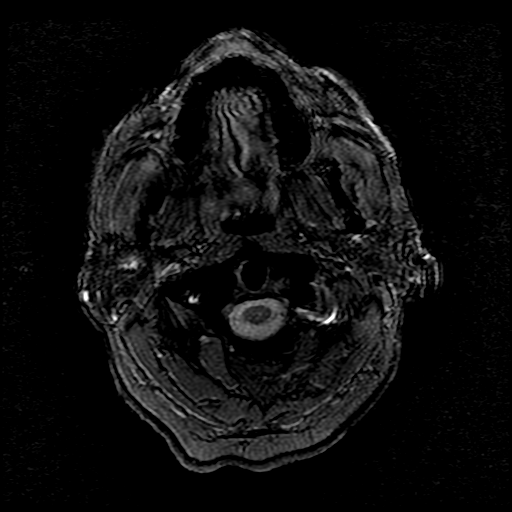
[im 16/32]
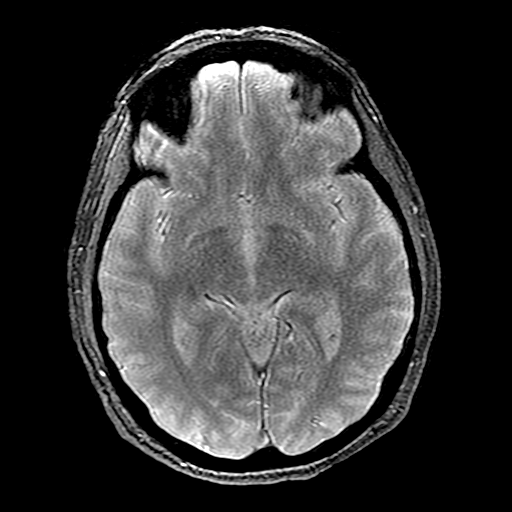

[Series 9: DWI · coronal · 4.0mm · 1.09mm/px · 7 of 86 slices shown (2 of 6)]
[im 1/86]
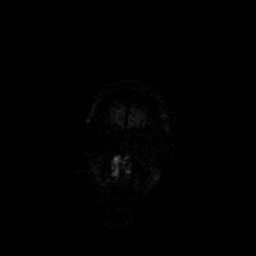
[im 15/86]
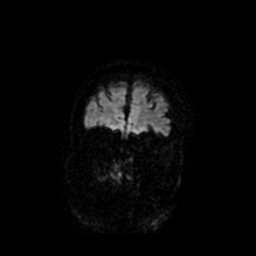
[im 29/86]
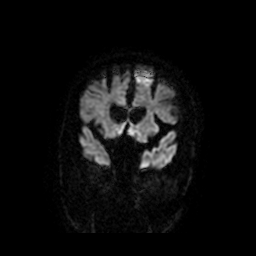
[im 43/86]
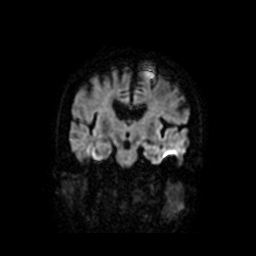
[im 57/86]
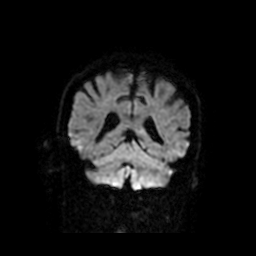
[im 71/86]
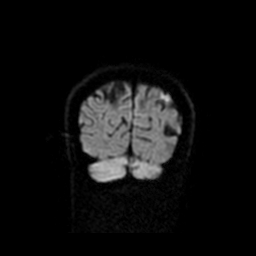
[im 86/86]
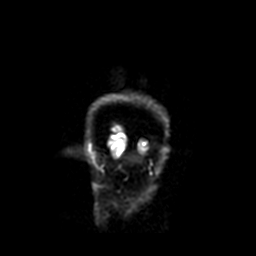

[Series 10: T2 · coronal · 5.0mm · 0.47mm/px · 2 of 26 slices shown (2 of 2)]
[im 1/26]
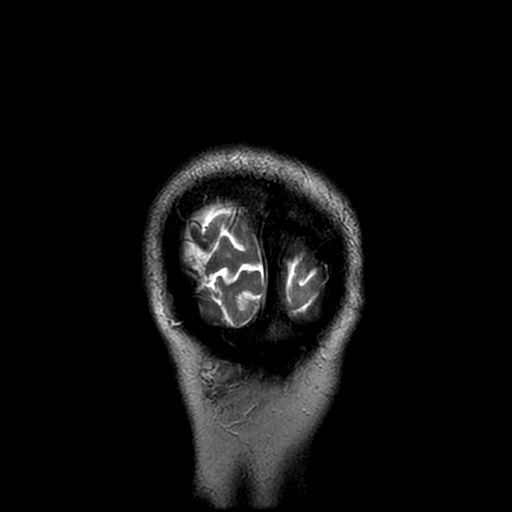
[im 26/26]
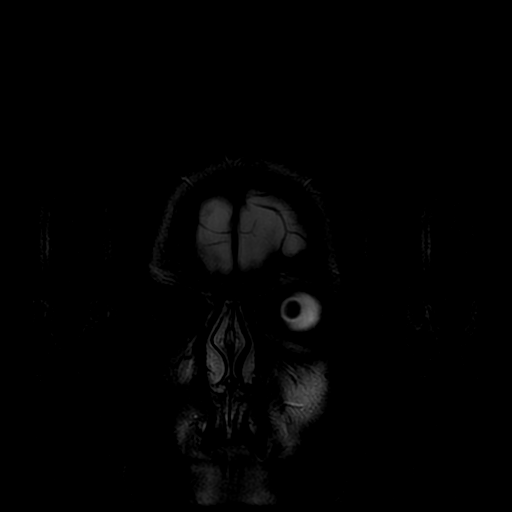

[Series 300: DWI · axial · 3.0mm · 1.09mm/px · z∈[-4,+142]mm · 4 of 50 slices shown (3 of 6)]
[im 1/50]
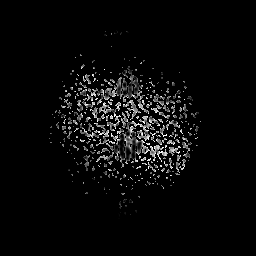
[im 17/50]
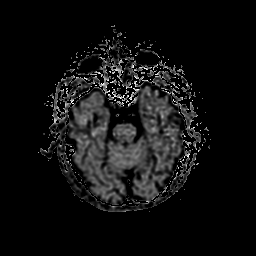
[im 33/50]
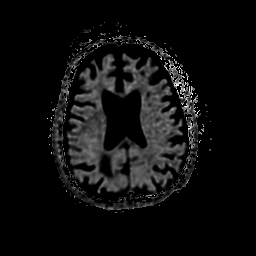
[im 50/50]
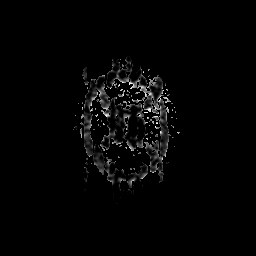

[Series 301: DWI · axial · 3.0mm · 1.09mm/px · z∈[-4,+142]mm · 4 of 50 slices shown (4 of 6)]
[im 1/50]
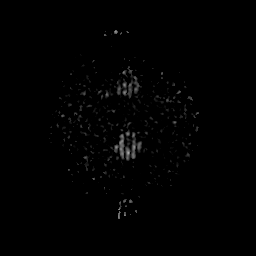
[im 17/50]
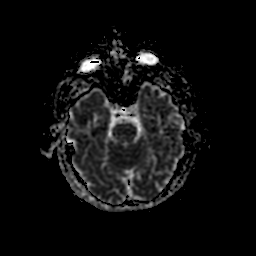
[im 33/50]
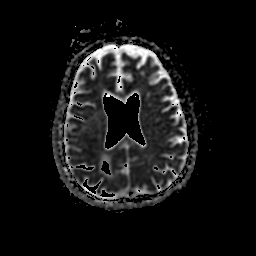
[im 50/50]
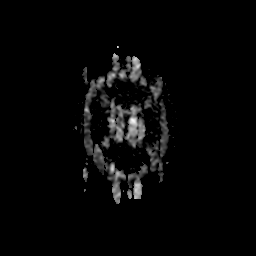

[Series 900: DWI · coronal · 4.0mm · 1.09mm/px · 3 of 43 slices shown (5 of 6)]
[im 1/43]
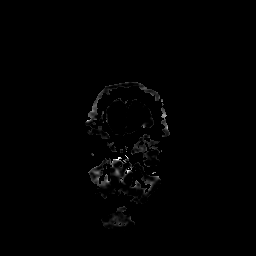
[im 22/43]
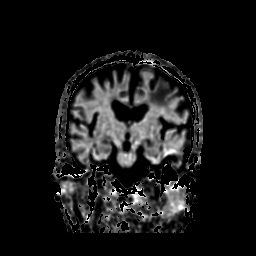
[im 43/43]
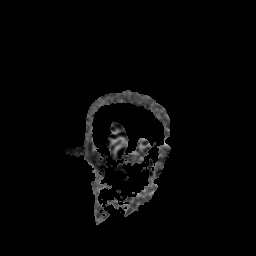

[Series 901: DWI · coronal · 4.0mm · 1.09mm/px · 3 of 42 slices shown (6 of 6)]
[im 1/42]
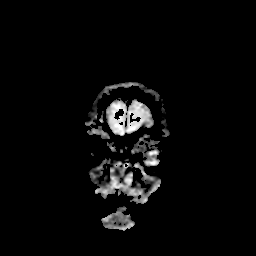
[im 21/42]
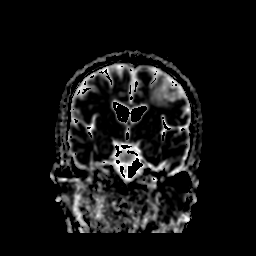
[im 42/42]
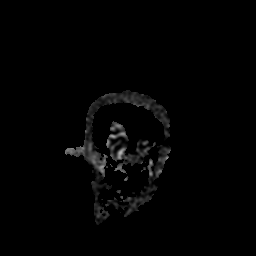

[39 of 48 positions shown; findings below may reference images not displayed]

FINDINGS: Brain: There is a background pattern of old cortical and subcortical
infarctions and both frontal and parietal regions. There are
scattered areas of acute infarction affecting the left frontal and
parietal cortical and subcortical brain consistent with embolic and
or watershed infarctions on the left. No evidence of mass effect or
hemorrhage. No acute infarctions seen on the right or in the
posterior circulation brain. No hydrocephalus. No extra-axial
collection.

Vascular: There appears to be abnormal flow in both internal carotid
arteries at the skull base level. These could be chronically
occluded. Vertebrobasilar system does show flow.

Skull and upper cervical spine: Negative

Sinuses/Orbits: Retention cyst right maxillary sinus. Orbits
negative.

Other: None
IMPRESSION: Old cortical and subcortical infarctions in both frontal and
parietal regions. Areas of acute infarction in the left frontal and
parietal brain without significant swelling or any acute hemorrhage.
No antegrade flow is seen in either internal carotid artery and
chronic occlusion is suspected. Posterior circulation does show
patency. These acute infarctions could be embolic or watershed in
nature.

## 2019-05-05 MED ORDER — ENOXAPARIN SODIUM 40 MG/0.4ML ~~LOC~~ SOLN
40.0000 mg | SUBCUTANEOUS | Status: AC
  Filled 2019-05-05: qty 0.4

## 2019-05-05 MED ORDER — ASPIRIN 325 MG PO TABS
325.0000 mg | ORAL_TABLET | Freq: Every day | ORAL | Status: DC
  Administered 2019-05-06 – 2019-05-07 (×2): 325 mg via ORAL
  Filled 2019-05-05 (×2): qty 1

## 2019-05-05 MED ORDER — SODIUM CHLORIDE 0.9 % IV SOLN
INTRAVENOUS | Status: DC
  Administered 2019-05-05 – 2019-05-08 (×3): via INTRAVENOUS

## 2019-05-05 MED ORDER — ASPIRIN EC 325 MG PO TBEC
325.0000 mg | DELAYED_RELEASE_TABLET | Freq: Once | ORAL | Status: AC
  Administered 2019-05-05: 325 mg via ORAL
  Filled 2019-05-05: qty 1

## 2019-05-05 MED ORDER — ATORVASTATIN CALCIUM 80 MG PO TABS
80.0000 mg | ORAL_TABLET | Freq: Every day | ORAL | Status: DC
  Administered 2019-05-06 – 2019-05-13 (×8): 80 mg via ORAL
  Filled 2019-05-05 (×8): qty 1

## 2019-05-05 NOTE — ED Provider Notes (Signed)
[facility] DEPT Provider Note   CSN: [id number] Arrival date & time: 05/05/19  7425    History   Chief Complaint Chief Complaint  Patient presents with   Altered Mental Status   altered mobility    HPI Jose Kaufman is a 68 y.o. male.     Pt presents to the ED today with confusion.  The pt said it started last week when he was cutting his grass.  He felt like he was disoriented.  The pt said he improved, but than started feeling the same way on Sunday, May 24.  The pt is unsure of the time sx started.  The pt said he has been able to eat and drink, but feels like he is has been unable to get his words out correctly.  He also feels like he can't walk right.  He tried to go to work today and was very disorientated.     Past Medical History:  Diagnosis Date   Abnormality of gait 10/15/2016   Arthritis    Dysrhythmia    hx of   Essential hypertension 03/14/2017   PVCs (premature ventricular contractions) 03/14/2017    Patient Active Problem List   Diagnosis Date Noted   Status post total replacement of left hip 08/23/2017   Unilateral primary osteoarthritis, left hip 05/02/2017   Status post total replacement of right hip 03/22/2017   PVCs (premature ventricular contractions) 03/14/2017   Essential hypertension 03/14/2017   Unilateral primary osteoarthritis, right hip 03/08/2017   Bilateral hip pain 10/15/2016   Abnormality of gait 10/15/2016    Past Surgical History:  Procedure Laterality Date   TOTAL HIP ARTHROPLASTY Right 03/22/2017   Procedure: RIGHT TOTAL HIP ARTHROPLASTY ANTERIOR APPROACH;  Surgeon: Mcarthur Rossetti, MD;  Location: [facility];  Service: Orthopedics;  Laterality: Right;   TOTAL HIP ARTHROPLASTY     Left 08/23/17 Dr. Zollie Beckers   TOTAL HIP ARTHROPLASTY Left 08/23/2017   Procedure: LEFT TOTAL HIP ARTHROPLASTY ANTERIOR APPROACH;  Surgeon: Mcarthur Rossetti, MD;  Location: [facility];  Service:  Orthopedics;  Laterality: Left;        Home Medications    Prior to Admission medications   Medication Sig Start Date End Date Taking? Authorizing Provider  acetaminophen (TYLENOL) 325 MG tablet Take 325 mg by mouth every 6 (six) hours as needed for mild pain.     [provider]  lisinopril-hydrochlorothiazide (PRINZIDE,ZESTORETIC) 10-12.5 MG tablet Take 1 tablet by mouth daily.    [provider]  Omega-3 Fatty Acids (FISH OIL PO) Take 1 capsule by mouth every evening.     [provider]  silver sulfADIAZINE (SILVADENE) 1 % cream Apply 1 application topically daily as needed (rash). Applied topically to affected area of skin after bathing.     [provider]    Family History Family History  Problem Relation Age of Onset   Leukemia Mother    Pneumonia Father     Social History Social History   Tobacco Use   Smoking status: Former Smoker    Types: Cigarettes    Last attempt to quit: 05/20/1992    Years since quitting: 26.9   Smokeless tobacco: Never Used  Substance Use Topics   Alcohol use: Yes    Comment: Occasional   Drug use: No     Allergies   Penicillins   Review of Systems Review of Systems  Neurological: Positive for speech difficulty and weakness.  All other systems reviewed and are  negative.    Physical Exam Updated Vital Signs BP 114/72    Pulse 77    Temp 98.1 F (36.7 C) (Oral)    Resp 11    Ht 5\' 8"  (1.727 m)    Wt 77.1 kg    SpO2 99%    BMI 25.85 kg/m   Physical Exam Vitals signs and nursing note reviewed.  Constitutional:      Appearance: Normal appearance.  HENT:     Head: Normocephalic and atraumatic.     Right Ear: External ear normal.     Left Ear: External ear normal.     Nose: Nose normal.     Mouth/Throat:     Mouth: Mucous membranes are moist.     Pharynx: Oropharynx is clear.  Eyes:     Extraocular Movements: Extraocular movements intact.     Conjunctiva/sclera: Conjunctivae  normal.     Pupils: Pupils are equal, round, and reactive to light.  Neck:     Musculoskeletal: Normal range of motion and neck supple.  Cardiovascular:     Rate and Rhythm: Normal rate and regular rhythm.     Pulses: Normal pulses.     Heart sounds: Normal heart sounds.  Pulmonary:     Effort: Pulmonary effort is normal.     Breath sounds: Normal breath sounds.  Abdominal:     General: Abdomen is flat. Bowel sounds are normal.     Palpations: Abdomen is soft.  Musculoskeletal: Normal range of motion.  Skin:    General: Skin is warm and dry.     Capillary Refill: Capillary refill takes less than 2 seconds.  Neurological:     General: No focal deficit present.     Mental Status: He is alert and oriented to person, place, and time.     Comments: Pt slow to respond at times, but speech seems clear.  Psychiatric:        Mood and Affect: Mood normal.        Behavior: Behavior normal.      ED Treatments / Results  Labs (all labs ordered are listed, but only abnormal results are displayed) Labs Reviewed  COMPREHENSIVE METABOLIC PANEL - Abnormal; Notable for the following components:      Result Value   Glucose, Bld 103 (*)    BUN 34 (*)    Creatinine, Ser 1.56 (*)    GFR calc non Af Amer 46 (*)    GFR calc Af Amer 53 (*)    All other components within normal limits  URINALYSIS, ROUTINE W REFLEX MICROSCOPIC - Abnormal; Notable for the following components:   Color, Urine STRAW (*)    Hgb urine dipstick SMALL (*)    Bacteria, UA RARE (*)    All other components within normal limits  SARS CORONAVIRUS 2 (HOSPITAL ORDER, [place] [facility])  ETHANOL  PROTIME-INR  APTT  CBC  DIFFERENTIAL  RAPID URINE DRUG SCREEN, HOSP PERFORMED    EKG EKG Interpretation  Date/Time:  Tuesday May 05 2019 08:27:27 EDT Ventricular Rate:  80 PR Interval:    QRS Duration: 102 QT Interval:  366 QTC Calculation: 409 R Axis:   -25 Text Interpretation:  Sinus rhythm  Ventricular bigeminy Borderline left axis deviation No significant change since last tracing Confirmed by Isla Pence [phone]) on 05/05/2019 8:32:40 AM Also confirmed by Isla Pence [phone]), editor Philomena Doheny [phone])  on 05/05/2019 8:37:47 AM   Radiology Ct Head Wo Contrast  Result Date: 05/05/2019 CLINICAL  DATA:  Confusion and speech disturbance beginning 2 days ago. EXAM: CT HEAD WITHOUT CONTRAST TECHNIQUE: Contiguous axial images were obtained from the base of the skull through the vertex without intravenous contrast. COMPARISON:  None. FINDINGS: Brain: No acute finding by CT. The brainstem and cerebellum appear normal. There are old appearing cortical and subcortical infarctions in the frontal and parietal regions of both hemispheres. None of these are discernibly recent. No mass lesion, hemorrhage, hydrocephalus or extra-axial collection. Vascular: No abnormal vascular finding. Skull: Negative Sinuses/Orbits: Scattered retention cysts. No sign of acute sinusitis. Orbits negative. Other: None IMPRESSION: No acute finding by CT. Old bilateral frontal and parietal cortical and subcortical infarctions. Electronically Signed   By: Nelson Chimes M.D.   On: 05/05/2019 09:48   Mr Brain Wo Contrast  Result Date: 05/05/2019 CLINICAL DATA:  Confusion. Speech disturbance. Old strokes. Assess for acute finding. Abnormal head CT. EXAM: MRI HEAD WITHOUT CONTRAST TECHNIQUE: Multiplanar, multiecho pulse sequences of the brain and surrounding structures were obtained without intravenous contrast. COMPARISON:  Head CT same day FINDINGS: Brain: There is a background pattern of old cortical and subcortical infarctions and both frontal and parietal regions. There are scattered areas of acute infarction affecting the left frontal and parietal cortical and subcortical brain consistent with embolic and or watershed infarctions on the left. No evidence of mass effect or hemorrhage. No acute infarctions seen on the right  or in the posterior circulation brain. No hydrocephalus. No extra-axial collection. Vascular: There appears to be abnormal flow in both internal carotid arteries at the skull base level. These could be chronically occluded. Vertebrobasilar system does show flow. Skull and upper cervical spine: Negative Sinuses/Orbits: Retention cyst right maxillary sinus. Orbits negative. Other: None IMPRESSION: Old cortical and subcortical infarctions in both frontal and parietal regions. Areas of acute infarction in the left frontal and parietal brain without significant swelling or any acute hemorrhage. No antegrade flow is seen in either internal carotid artery and chronic occlusion is suspected. Posterior circulation does show patency. These acute infarctions could be embolic or watershed in nature. Electronically Signed   By: Nelson Chimes M.D.   On: 05/05/2019 12:00   Dg Chest Portable 1 View  Result Date: 05/05/2019 CLINICAL DATA:  Mental status changes EXAM: PORTABLE CHEST 1 VIEW COMPARISON:  05/03/2016 FINDINGS: Bibasilar opacities, likely atelectasis. Heart is normal size. No effusions. No acute bony abnormality. IMPRESSION: Bibasilar airspace opacities, atelectasis versus early infiltrates. Electronically Signed   By: Rolm Baptise M.D.   On: 05/05/2019 08:51    Procedures Procedures (including critical care time)  Medications Ordered in ED Medications  aspirin EC tablet 325 mg (has no administration in time range)     Initial Impression / Assessment and Plan / ED Course  I have reviewed the triage vital signs and the nursing notes.  Pertinent labs & imaging results that were available during my care of the patient were reviewed by me and considered in my medical decision making (see chart for details).       MRI results reviewed with neurology (Dr. Cheral Marker) who recommended admission for stroke work up at [facility].    The pt requested that I update his brother, Glendell Docker, which I did.  Pt d/w Dr. Rodena Piety  (triad) for admission to [facility].  CRITICAL CARE Performed by: Isla Pence   Total critical care time: 30 minutes  Critical care time was exclusive of separately billable procedures and treating other patients.  Critical care was necessary to treat or prevent  imminent or life-threatening deterioration.  Critical care was time spent personally by me on the following activities: development of treatment plan with patient and/or surrogate as well as nursing, discussions with consultants, evaluation of patient's response to treatment, examination of patient, obtaining history from patient or surrogate, ordering and performing treatments and interventions, ordering and review of laboratory studies, ordering and review of radiographic studies, pulse oximetry and re-evaluation of patient's condition.      Final Clinical Impressions(s) / ED Diagnoses   Final diagnoses:  Cerebrovascular accident (CVA), unspecified mechanism ([place])  Internal carotid artery occlusion, bilateral  Covid-19 Virus not Detected    ED Discharge Orders    None       Isla Pence, MD 05/05/19 1327

## 2019-05-05 NOTE — ED Notes (Signed)
Patient is being picked up by [facility] and taken to [facility] 3w bed 18.

## 2019-05-05 NOTE — ED Notes (Signed)
ECHO at bedside.

## 2019-05-05 NOTE — ED Notes (Signed)
Patient transported to CT 

## 2019-05-05 NOTE — ED Notes (Signed)
Pt returned from MRI °

## 2019-05-05 NOTE — ED Notes (Signed)
ED TO INPATIENT HANDOFF REPORT  ED Nurse Name and Phone #: [phone] [place], [place] Name/Age/Gender Wayne Sever 68 y.o. male Room/Bed: WA09/WA09  Code Status   Code Status: Full Code  Home/SNF/Other Home Patient oriented to: self, place and situation Is this baseline? No   Triage Complete: Triage complete  Chief Complaint slurred speach   Triage Note Patient reports that he began having symptoms Sunday night,but unable to give precise symptoms. Today, the patient states that he feels more confused and unable to get his words out. Patient was at work today. Patient then says he was mowing his yard about a week ago and feels like he had a heat stroke then.   Allergies Allergies  Allergen Reactions  . Penicillins Other (See Comments)    Childhood reaction. Has patient had a PCN reaction causing immediate rash, facial/tongue/throat swelling, SOB or lightheadedness with hypotension: unsure Has patient had a PCN reaction causing severe rash involving mucus membranes or skin necrosis: unsure Has patient had a PCN reaction that required hospitalization:No Has patient had a PCN reaction occurring within the last 10 years:No If all of the above answers are "NO", then may proceed with Cephalosporin use    Level of Care/Admitting Diagnosis ED Disposition    ED Disposition Condition Comment   Admit  Hospital Area: [facility] [[id number]]  Level of Care: Telemetry Medical [104]  Covid Evaluation: N/A  Diagnosis: CVA (cerebral vascular accident) (HCC) [298226]  Admitting Physician: MATHEWS, ELIZABETH G [[id number]]  Attending Physician: MATHEWS, ELIZABETH G [[id number]]  Estimated length of stay: 3 - 4 days  Certification:: I certify this patient will need inpatient services for at least 2 midnights  PT Class (Do Not Modify): Inpatient [101]  PT Acc Code (Do Not Modify): Private [1]       B Medical/Surgery History Past Medical History:  Diagnosis Date  .  Abnormality of gait 10/15/2016  . Arthritis   . Dysrhythmia    hx of  . Essential hypertension 03/14/2017  . PVCs (premature ventricular contractions) 03/14/2017   Past Surgical History:  Procedure Laterality Date  . TOTAL HIP ARTHROPLASTY Right 03/22/2017   Procedure: RIGHT TOTAL HIP ARTHROPLASTY ANTERIOR APPROACH;  Surgeon: Christopher Y Blackman, MD;  Location: [facility];  Service: Orthopedics;  Laterality: Right;  . TOTAL HIP ARTHROPLASTY     Left 08/23/17 Dr. Blackman Chris  . TOTAL HIP ARTHROPLASTY Left 08/23/2017   Procedure: LEFT TOTAL HIP ARTHROPLASTY ANTERIOR APPROACH;  Surgeon: Blackman, Christopher Y, MD;  Location: [facility];  Service: Orthopedics;  Laterality: Left;     A IV Location/Drains/Wounds Patient Lines/Drains/Airways Status   Active Line/Drains/Airways    Name:   Placement date:   Placement time:   Site:   Days:   Peripheral IV 05/05/19 Left;Distal;Upper Arm   05/05/19    0831    Arm   less than 1   Incision (Closed) 03/22/17 Hip Right   03/22/17    1431     774   Incision (Closed) 08/23/17 Hip Left   08/23/17    0945     62 0          Intake/Output Last 24 hours No intake or output data in the 24 hours ending 05/05/19 1706  Labs/Imaging Results for orders placed or performed during the hospital encounter of 05/05/19 (from the past 48 hour(s))  Ethanol     Status: None   Collection Time: 05/05/19  8:25 AM  Result Value Ref Range   Alcohol,  Ethyl (B) <10 <10 mg/dL    Comment: (NOTE) Lowest detectable limit for serum alcohol is 10 mg/dL. For medical purposes only. Performed at [facility], [place] [address], [place], [place] [address]   Protime-INR     Status: None   Collection Time: 05/05/19  8:25 AM  Result Value Ref Range   Prothrombin Time 14.1 11.4 - 15.2 seconds   INR 1.1 0.8 - 1.2    Comment: (NOTE) INR goal varies based on device and disease states. Performed at [facility], [place] [address] [place], [place]  [address]   APTT     Status: None   Collection Time: 05/05/19  8:25 AM  Result Value Ref Range   aPTT 34 24 - 36 seconds    Comment: Performed at [facility], [place] [address] [place], [place] [address]  CBC     Status: None   Collection Time: 05/05/19  8:25 AM  Result Value Ref Range   WBC 8.6 4.0 - 10.5 K/uL   RBC 4.88 4.22 - 5.81 MIL/uL   Hemoglobin 15.4 13.0 - 17.0 g/dL   HCT 46.4 39.0 - 52.0 %   MCV 95.1 80.0 - 100.0 fL   MCH 31.6 26.0 - 34.0 pg   MCHC 33.2 30.0 - 36.0 g/dL   RDW 12.5 11.5 - 15.5 %   Platelets 216 150 - 400 K/uL   nRBC 0.0 0.0 - 0.2 %    Comment: Performed at [facility], [place] [address] [place], [place] [address]  Differential     Status: None   Collection Time: 05/05/19  8:25 AM  Result Value Ref Range   Neutrophils Relative % 78 %   Neutro Abs 6.8 1.7 - 7.7 K/uL   Lymphocytes Relative 13 %   Lymphs Abs 1.1 0.7 - 4.0 K/uL   Monocytes Relative 7 %   Monocytes Absolute 0.6 0.1 - 1.0 K/uL   Eosinophils Relative 1 %   Eosinophils Absolute 0.1 0.0 - 0.5 K/uL   Basophils Relative 1 %   Basophils Absolute 0.1 0.0 - 0.1 K/uL   Immature Granulocytes 0 %   Abs Immature Granulocytes 0.02 0.00 - 0.07 K/uL    Comment: Performed at [facility], [place] [address] [place], [place] [address]  Comprehensive metabolic panel     Status: Abnormal   Collection Time: 05/05/19  8:25 AM  Result Value Ref Range   Sodium 136 135 - 145 mmol/L   Potassium 3.8 3.5 - 5.1 mmol/L   Chloride 103 98 - 111 mmol/L   CO2 26 22 - 32 mmol/L   Glucose, Bld 103 (H) 70 - 99 mg/dL   BUN 34 (H) 8 - 23 mg/dL   Creatinine, Ser 1.56 (H) 0.61 - 1.24 mg/dL   Calcium 9.3 8.9 - 10.3 mg/dL   Total Protein 8.0 6.5 - 8.1 g/dL   Albumin 4.7 3.5 - 5.0 g/dL   AST 19 15 - 41 U/L   ALT 20 0 - 44 U/L   Alkaline Phosphatase 74 38 - 126 U/L   Total Bilirubin 0.8 0.3 - 1.2 mg/dL   GFR calc non Af Amer 46 (L) >60 mL/min   GFR calc Af Amer 53 (L) >60  mL/min   Anion gap 7 5 - 15    Comment: Performed at [facility], [place] [address] [place], [place] [address]  Urine rapid drug screen (hosp performed)     Status: None  Collection Time: 05/05/19  8:25 AM  Result Value Ref Range   Opiates NONE DETECTED NONE DETECTED   Cocaine NONE DETECTED NONE DETECTED   Benzodiazepines NONE DETECTED NONE DETECTED   Amphetamines NONE DETECTED NONE DETECTED   Tetrahydrocannabinol NONE DETECTED NONE DETECTED   Barbiturates NONE DETECTED NONE DETECTED    Comment: (NOTE) DRUG SCREEN FOR MEDICAL PURPOSES ONLY.  IF CONFIRMATION IS NEEDED FOR ANY PURPOSE, NOTIFY LAB WITHIN 5 DAYS. LOWEST DETECTABLE LIMITS FOR URINE DRUG SCREEN Drug Class                     Cutoff (ng/mL) Amphetamine and metabolites    1000 Barbiturate and metabolites    200 Benzodiazepine                 732 Tricyclics and metabolites     300 Opiates and metabolites        300 Cocaine and metabolites        300 THC                            50 Performed at [facility], [place] [address], [place], [place] [address]   Urinalysis, Routine w reflex microscopic     Status: Abnormal   Collection Time: 05/05/19  8:25 AM  Result Value Ref Range   Color, Urine STRAW (A) YELLOW   APPearance CLEAR CLEAR   Specific Gravity, Urine 1.009 1.005 - 1.030   pH 6.0 5.0 - 8.0   Glucose, UA NEGATIVE NEGATIVE mg/dL   Hgb urine dipstick SMALL (A) NEGATIVE   Bilirubin Urine NEGATIVE NEGATIVE   Ketones, ur NEGATIVE NEGATIVE mg/dL   Protein, ur NEGATIVE NEGATIVE mg/dL   Nitrite NEGATIVE NEGATIVE   Leukocytes,Ua NEGATIVE NEGATIVE   RBC / HPF 0-5 0 - 5 RBC/hpf   WBC, UA 0-5 0 - 5 WBC/hpf   Bacteria, UA RARE (A) NONE SEEN   Mucus PRESENT     Comment: Performed at [facility], [place] [address], [place], [place] [address]  Magnesium     Status: None   Collection Time: 05/05/19  8:25 AM  Result Value Ref Range   Magnesium 2.2 1.7 - 2.4 mg/dL     Comment: Performed at [facility], [place] [address], [place], [place] [address]  SARS Coronavirus 2 (CEPHEID - Performed in [place] hospital lab), Hosp Order     Status: None   Collection Time: 05/05/19  8:59 AM  Result Value Ref Range   SARS Coronavirus 2 NEGATIVE NEGATIVE    Comment: (NOTE) If result is NEGATIVE SARS-CoV-2 target nucleic acids are NOT DETECTED. The SARS-CoV-2 RNA is generally detectable in upper and lower  respiratory specimens during the acute phase of infection. The lowest  concentration of SARS-CoV-2 viral copies this assay can detect is 250  copies / mL. A negative result does not preclude SARS-CoV-2 infection  and should not be used as the sole basis for treatment or other  patient management decisions.  A negative result may occur with  improper specimen collection / handling, submission of specimen other  than nasopharyngeal swab, presence of viral mutation(s) within the  areas targeted by this assay, and inadequate number of viral copies  (<250 copies / mL). A negative result must be combined with clinical  observations, patient history, and epidemiological information. If result is POSITIVE SARS-CoV-2 target nucleic acids are DETECTED. The SARS-CoV-2 RNA is generally  detectable in upper and lower  respiratory specimens dur ing the acute phase of infection.  Positive  results are indicative of active infection with SARS-CoV-2.  Clinical  correlation with patient history and other diagnostic information is  necessary to determine patient infection status.  Positive results do  not rule out bacterial infection or co-infection with other viruses. If result is PRESUMPTIVE POSTIVE SARS-CoV-2 nucleic acids MAY BE PRESENT.   A presumptive positive result was obtained on the submitted specimen  and confirmed on repeat testing.  While 2019 novel coronavirus  (SARS-CoV-2) nucleic acids may be present in the submitted sample  additional  confirmatory testing may be necessary for epidemiological  and / or clinical management purposes  to differentiate between  SARS-CoV-2 and other Sarbecovirus currently known to infect humans.  If clinically indicated additional testing with an alternate test  methodology [phone]) is advised. The SARS-CoV-2 RNA is generally  detectable in upper and lower respiratory sp ecimens during the acute  phase of infection. The expected result is Negative. Fact Sheet for Patients:  StrictlyIdeas.no Fact Sheet for Healthcare Providers: BankingDealers.co.za This test is not yet approved or cleared by the [place] FDA and has been authorized for detection and/or diagnosis of SARS-CoV-2 by FDA under an Emergency Use Authorization (EUA).  This EUA will remain in effect (meaning this test can be used) for the duration of the COVID-19 declaration under Section 564(b)(1) of the Act, 21 U.S.C. section 360bbb-3(b)(1), unless the authorization is terminated or revoked sooner. Performed at [facility], [place] [address], [place], [name] [address]    Ct Head Wo Contrast  Result Date: 05/05/2019 CLINICAL DATA:  Confusion and speech disturbance beginning 2 days ago. EXAM: CT HEAD WITHOUT CONTRAST TECHNIQUE: Contiguous axial images were obtained from the base of the skull through the vertex without intravenous contrast. COMPARISON:  None. FINDINGS: Brain: No acute finding by CT. The brainstem and cerebellum appear normal. There are old appearing cortical and subcortical infarctions in the frontal and parietal regions of both hemispheres. None of these are discernibly recent. No mass lesion, hemorrhage, hydrocephalus or extra-axial collection. Vascular: No abnormal vascular finding. Skull: Negative Sinuses/Orbits: Scattered retention cysts. No sign of acute sinusitis. Orbits negative. Other: None IMPRESSION: No acute finding by CT. Old bilateral  frontal and parietal cortical and subcortical infarctions. Electronically Signed   By: Nelson Chimes M.D.   On: 05/05/2019 09:48   Mr Brain Wo Contrast  Result Date: 05/05/2019 CLINICAL DATA:  Confusion. Speech disturbance. Old strokes. Assess for acute finding. Abnormal head CT. EXAM: MRI HEAD WITHOUT CONTRAST TECHNIQUE: Multiplanar, multiecho pulse sequences of the brain and surrounding structures were obtained without intravenous contrast. COMPARISON:  Head CT same day FINDINGS: Brain: There is a background pattern of old cortical and subcortical infarctions and both frontal and parietal regions. There are scattered areas of acute infarction affecting the left frontal and parietal cortical and subcortical brain consistent with embolic and or watershed infarctions on the left. No evidence of mass effect or hemorrhage. No acute infarctions seen on the right or in the posterior circulation brain. No hydrocephalus. No extra-axial collection. Vascular: There appears to be abnormal flow in both internal carotid arteries at the skull base level. These could be chronically occluded. Vertebrobasilar system does show flow. Skull and upper cervical spine: Negative Sinuses/Orbits: Retention cyst right maxillary sinus. Orbits negative. Other: None IMPRESSION: Old cortical and subcortical infarctions in both frontal and parietal regions. Areas of acute infarction in the left frontal and  parietal brain without significant swelling or any acute hemorrhage. No antegrade flow is seen in either internal carotid artery and chronic occlusion is suspected. Posterior circulation does show patency. These acute infarctions could be embolic or watershed in nature. Electronically Signed   By: Nelson Chimes M.D.   On: 05/05/2019 12:00   Dg Chest Portable 1 View  Result Date: 05/05/2019 CLINICAL DATA:  Mental status changes EXAM: PORTABLE CHEST 1 VIEW COMPARISON:  05/03/2016 FINDINGS: Bibasilar opacities, likely atelectasis. Heart is  normal size. No effusions. No acute bony abnormality. IMPRESSION: Bibasilar airspace opacities, atelectasis versus early infiltrates. Electronically Signed   By: Rolm Baptise M.D.   On: 05/05/2019 08:51    Pending Labs Unresulted Labs (From admission, onward)    Start     Ordered   05/06/19 0500  Lipid panel  Tomorrow morning,   R     05/05/19 1408   05/06/19 0500  CBC  Tomorrow morning,   R     05/05/19 1424   05/06/19 0500  Comprehensive metabolic panel  Tomorrow morning,   R     05/05/19 1424   05/05/19 1408  Hemoglobin A1c  Once,   R     05/05/19 1407   05/05/19 1408  TSH  Once,   R     05/05/19 1407   05/05/19 1407  HIV antibody (Routine Testing)  Once,   R     05/05/19 1407          Vitals/Pain Today's Vitals   05/05/19 1400 05/05/19 1430 05/05/19 1500 05/05/19 1530  BP: 128/81 127/87 122/69 140/80  Pulse: 77 83 79 79  Resp: 12 17 (!) 22 14  Temp:      TempSrc:      SpO2: 99% 98% 97% 100%  Weight:      Height:      PainSc:        Isolation Precautions No active isolations  Medications Medications  enoxaparin (LOVENOX) injection 30 mg (has no administration in time range)  0.9 %  sodium chloride infusion (has no administration in time range)  aspirin tablet 325 mg (has no administration in time range)  atorvastatin (LIPITOR) tablet 80 mg (has no administration in time range)  aspirin EC tablet 325 mg (325 mg Oral Given 05/05/19 1403)    Mobility walks Low fall risk   Focused Assessments Neuro Assessment Handoff:  Swallow screen pass? Yes          Neuro Assessment: Exceptions to WDL Neuro Checks:      Last Documented NIHSS Modified Score:   Has TPA been given? No If patient is a Neuro Trauma and patient is going to OR before floor call report to [place] nurse: [phone] or [phone]     R Recommendations: See Admitting Provider Note  Report given to: [id number]  Additional Notes:

## 2019-05-05 NOTE — Consult Note (Signed)
NEURO HOSPITALIST CONSULT NOTE   Requestig physician: Dr. Gerline Legacy  Reason for Consult: Acute stroke  History obtained from:   Patient and Chart    HPI:                                                                                                                                          Jose Kaufman is an 68 y.o. male who presents to the [facility] ED for evaluation of new onset aphasia. He states his symptoms began on Saturday night with slurred speech, difficulty with speech comprehension and difficulty with word finding and BLE weakness. Chart review reveals that he also has endorsed confusion and disorientation since last week. Dr. Zigmund [name]' note documents that the patient's coworders had thought that something was wrong and had asked him to go to the ED. Of note, he had similar symptoms last year during the Summer, for which he did not seek medical care. He does not smoke or use illicit drugs. He takes ASA.  His PMHx includes stroke risk factors of HTN and HLD.        Past Medical History:  Diagnosis Date   Abnormality of gait 10/15/2016   Arthritis    Dysrhythmia    hx of   Essential hypertension 03/14/2017   PVCs (premature ventricular contractions) 03/14/2017    Past Surgical History:  Procedure Laterality Date   TOTAL HIP ARTHROPLASTY Right 03/22/2017   Procedure: RIGHT TOTAL HIP ARTHROPLASTY ANTERIOR APPROACH;  Surgeon: Mcarthur Rossetti, MD;  Location: [facility];  Service: Orthopedics;  Laterality: Right;   TOTAL HIP ARTHROPLASTY     Left 08/23/17 Dr. Zollie Beckers   TOTAL HIP ARTHROPLASTY Left 08/23/2017   Procedure: LEFT TOTAL HIP ARTHROPLASTY ANTERIOR APPROACH;  Surgeon: Mcarthur Rossetti, MD;  Location: [facility];  Service: Orthopedics;  Laterality: Left;    Family History  Problem Relation Age of Onset   Leukemia Mother    Pneumonia Father              Social History:  reports that he quit smoking about 26 years ago. His smoking use  included cigarettes. He has never used smokeless tobacco. He reports current alcohol use. He reports that he does not use drugs.  Allergies  Allergen Reactions   Penicillins Other (See Comments)    Childhood reaction. Has patient had a PCN reaction causing immediate rash, facial/tongue/throat swelling, SOB or lightheadedness with hypotension: unsure Has patient had a PCN reaction causing severe rash involving mucus membranes or skin necrosis: unsure Has patient had a PCN reaction that required hospitalization:No Has patient had a PCN reaction occurring within the last 10 years:No If all of the above answers are "NO", then may proceed with Cephalosporin use    HOME MEDICATIONS:  ROS:                                                                                                                                       Denies H/A. N/V, vision changes, neck pain, facial droop, limb numbness or tingling, CP or abdominal pain. Has had bladder incontinence. All other systems negative, except as noted in the HPI.    Blood pressure 127/87, pulse 83, temperature 98.1 F (36.7 C), temperature source Oral, resp. rate 17, height 5\' 8"  (1.727 m), weight 77.1 kg, SpO2 98 %.   General Examination:                                                                                                       Physical Exam  HEENT-  [place]/AT   Lungs- Respirations unlabored Extremities- No edema   Neurological Examination Mental Status: Awake and alert. Oriented to day, month, city and state but gives year as "2000". Naming for common items is intact. Has hesitant speech at times with word-finding difficulty. Mild dysarthria. Can follow simple commands, but has difficulty with more complex commands such as finger-to-nose during cerebellar testing, puffing his cheeks and following commands for visual  fields testing.  Cranial Nerves: II: Visual fields intact bilaterally, all 4 quadrants. PERRL.  III,IV, VI: Mild left ptosis. EOMI without nystagmus.  VII: No facial droop.  VIII: hearing intact to voice IX,X: No hoarseness or hypophonia XI: Head is midline XII: midline tongue extension Motor: Right : Upper extremity   5/5    Left:     Upper extremity   5/5  Lower extremity   5/5     Lower extremity   5/5 There is mildly increased extensor tone at left elbow and increased flexor tone at left wrist.  Subtle left pronator drift noted.  Sensory: Temp intact x 4. Positive for extinction to RUE with DSS.  Deep Tendon Reflexes:  3+ right biceps and brachioradialis 2+ left biceps and brachioracdialis 3+ right patella 4+ left patella 2+ right achilles 1+ left achilles Right toe downgoing Left toe upgoing Cerebellar: No ataxia with FNF bilaterally, but has difficulty following commands.  Gait: Deferred   Lab Results: Basic Metabolic Panel: Recent Labs  Lab 05/05/19 0825  NA 136  K 3.8  CL 103  CO2 26  GLUCOSE 103*  BUN 34*  CREATININE 1.56*  CALCIUM 9.3    CBC: Recent Labs  Lab 05/05/19 0825  WBC 8.6  NEUTROABS 6.8  HGB 15.4  HCT 46.4  MCV 95.1  PLT 216    Cardiac Enzymes: No results for input(s): CKTOTAL, CKMB, CKMBINDEX, TROPONINI in the last 168 hours.  Lipid Panel: No results for input(s): CHOL, TRIG, HDL, CHOLHDL, VLDL, LDLCALC in the last 168 hours.  Imaging: Ct Head Wo Contrast  Result Date: 05/05/2019 CLINICAL DATA:  Confusion and speech disturbance beginning 2 days ago. EXAM: CT HEAD WITHOUT CONTRAST TECHNIQUE: Contiguous axial images were obtained from the base of the skull through the vertex without intravenous contrast. COMPARISON:  None. FINDINGS: Brain: No acute finding by CT. The brainstem and cerebellum appear normal. There are old appearing cortical and subcortical infarctions in the frontal and parietal regions of both hemispheres. None of these  are discernibly recent. No mass lesion, hemorrhage, hydrocephalus or extra-axial collection. Vascular: No abnormal vascular finding. Skull: Negative Sinuses/Orbits: Scattered retention cysts. No sign of acute sinusitis. Orbits negative. Other: None IMPRESSION: No acute finding by CT. Old bilateral frontal and parietal cortical and subcortical infarctions. Electronically Signed   By: Nelson Chimes M.D.   On: 05/05/2019 09:48   Mr Brain Wo Contrast  Result Date: 05/05/2019 CLINICAL DATA:  Confusion. Speech disturbance. Old strokes. Assess for acute finding. Abnormal head CT. EXAM: MRI HEAD WITHOUT CONTRAST TECHNIQUE: Multiplanar, multiecho pulse sequences of the brain and surrounding structures were obtained without intravenous contrast. COMPARISON:  Head CT same day FINDINGS: Brain: There is a background pattern of old cortical and subcortical infarctions and both frontal and parietal regions. There are scattered areas of acute infarction affecting the left frontal and parietal cortical and subcortical brain consistent with embolic and or watershed infarctions on the left. No evidence of mass effect or hemorrhage. No acute infarctions seen on the right or in the posterior circulation brain. No hydrocephalus. No extra-axial collection. Vascular: There appears to be abnormal flow in both internal carotid arteries at the skull base level. These could be chronically occluded. Vertebrobasilar system does show flow. Skull and upper cervical spine: Negative Sinuses/Orbits: Retention cyst right maxillary sinus. Orbits negative. Other: None IMPRESSION: Old cortical and subcortical infarctions in both frontal and parietal regions. Areas of acute infarction in the left frontal and parietal brain without significant swelling or any acute hemorrhage. No antegrade flow is seen in either internal carotid artery and chronic occlusion is suspected. Posterior circulation does show patency. These acute infarctions could be embolic  or watershed in nature. Electronically Signed   By: Nelson Chimes M.D.   On: 05/05/2019 12:00   Dg Chest Portable 1 View  Result Date: 05/05/2019 CLINICAL DATA:  Mental status changes EXAM: PORTABLE CHEST 1 VIEW COMPARISON:  05/03/2016 FINDINGS: Bibasilar opacities, likely atelectasis. Heart is normal size. No effusions. No acute bony abnormality. IMPRESSION: Bibasilar airspace opacities, atelectasis versus early infiltrates. Electronically Signed   By: Rolm Baptise M.D.   On: 05/05/2019 08:51    Assessment: 68 year old male presenting with acute left fronto-parietal strokes. 1. Exam reveals expressive and receptive dysphasia, consistent with the location of the new strokes. 2. MRI also reveals no antegrade flow in either ICA, with chronic occlusions suspected. Watershed mechanism for the strokes is therefore suspected.   Recommendations: 1. HgbA1c, fasting lipid panel 2. CTA of head and neck 3. TTE 4. Cardiac telemetry 5. Echocardiogram 6. PT consult, OT consult, Speech consult 7. Add Plavix to ASA 8. Start atorvastatin. Obtain baseline CK level 9. Out of permissive HTN time window given onset of symptoms on Saturday. SBP goal of 120 - 140 10. Risk  factor modification 11. Frequent neuro checks 12 NPO until passes stroke swallow screen 13 Please page stroke NP  Or  PA  Or MD from 8am -4 pm  as this patient from this time will be  followed by the stroke.   You can look them up on [email or web address]  Password [email or web address]    Electronically signed: Dr. Kerney Elbe 05/05/2019, 2:46 PM

## 2019-05-05 NOTE — ED Notes (Signed)
X-ray at bedside

## 2019-05-05 NOTE — ED Notes (Signed)
Pt provided urinal, informed of need for urine sample.  

## 2019-05-05 NOTE — ED Triage Notes (Signed)
Patient reports that he began having symptoms Sunday night,but unable to give precise symptoms. Today, the patient states that he feels more confused and unable to get his words out. Patient was at work today. Patient then says he was mowing his yard about a week ago and feels like he had a heat stroke then.

## 2019-05-05 NOTE — ED Notes (Addendum)
Patient transported to MRI 

## 2019-05-05 NOTE — H&P (Signed)
History and Physical    Jose Kaufman JQZ:[id number] DOB: May 15, 1951 DOA: 05/05/2019  PCP: Michel Harrow, PA-C Patient coming from: Home Chief Complaint: Unable to get words out to speak   HPI: Jose Kaufman is a 68 y.o. 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  He lives alone and he thought it would get better he reported it got better but then it started coming back again on May 24.  He is able to move his hands and legs able to swallow breathe denies any headaches nausea vomiting changes with his vision.  However he does not get the words correct words out of his mouth.  He went to work and the coworkers thought that there is something wrong with him and asked him to come to the ER.  He denies any chest pain shortness of breath he denies any sick contacts or recent travel.  He denies fever chills urinary complaints constipation or diarrhea.  He reports having the same symptoms in the summer 2019 where he did not tell anybody or come to hospital for help. He denies smoking use of alcohol or illegal drugs.  He takes aspirin 81 mg at home.    ED Course: Patient received aspirin MRI done shows old cortical and subcortical infarctions in both frontal and parietal regions area of acute infarction in the left frontal and parietal brain without significant swelling or any acute hemorrhage.  No antegrade flow is seen in either internal carotid artery and chronic occlusion is suspected.  Posterior circulation does show patency.  These acute infarctions could be embolic or watershed in nature.  Chest x-ray shows bibasilar atelectasis versus early infiltrates.  He is COVID negative.  Creatinine 1.56 BUN 34 sodium 136 potassium 3.8 white count 8.6 hemoglobin 15.4 platelets 216 urine drug screen negative alcohol level less than 10  Review of Systems: As per HPI otherwise all other systems  reviewed and are negative  Ambulatory Status: At baseline he is ambulatory to the last week he does all the work at home and does take care of his lawn since last week he has not been able to do that due to confusion disorientation unstable gait and not able to think right.  Past Medical History:  Diagnosis Date  . Abnormality of gait 10/15/2016  . Arthritis   . Dysrhythmia    hx of  . Essential hypertension 03/14/2017  . PVCs (premature ventricular contractions) 03/14/2017    Past Surgical History:  Procedure Laterality Date  . TOTAL HIP ARTHROPLASTY Right 03/22/2017   Procedure: RIGHT TOTAL HIP ARTHROPLASTY ANTERIOR APPROACH;  Surgeon: Mcarthur Rossetti, MD;  Location: [facility];  Service: Orthopedics;  Laterality: Right;  . TOTAL HIP ARTHROPLASTY     Left 08/23/17 Dr. Zollie Beckers  . TOTAL HIP ARTHROPLASTY Left 08/23/2017   Procedure: LEFT TOTAL HIP ARTHROPLASTY ANTERIOR APPROACH;  Surgeon: Mcarthur Rossetti, MD;  Location: [facility];  Service: Orthopedics;  Laterality: Left;    Social History   Socioeconomic History  . Marital status: Divorced    Spouse name: Not on file  . Number of children: 2  . Years of education: 40  . Highest education level: Not on file  Occupational History  . Occupation: [employer]  . Financial resource strain: Not on file  . Food insecurity:    Worry: Not on file    Inability: Not on  file  . Transportation needs:    Medical: Not on file    Non-medical: Not on file  Tobacco Use  . Smoking status: Former Smoker    Types: Cigarettes    Last attempt to quit: 05/20/1992    Years since quitting: 26.9  . Smokeless tobacco: Never Used  Substance and Sexual Activity  . Alcohol use: Yes    Comment: Occasional  . Drug use: No  . Sexual activity: Yes  Lifestyle  . Physical activity:    Days per week: Not on file    Minutes per session: Not on file  . Stress: Not on file  Relationships  . Social connections:    Talks on  phone: Not on file    Gets together: Not on file    Attends religious service: Not on file    Active member of club or organization: Not on file    Attends meetings of clubs or organizations: Not on file    Relationship status: Not on file  . Intimate partner violence:    Fear of current or ex partner: Not on file    Emotionally abused: Not on file    Physically abused: Not on file    Forced sexual activity: Not on file  Other Topics Concern  . Not on file  Social History Narrative   Pt lives at home, divorced   Right-handed   Caffeine: soda daily    Allergies  Allergen Reactions  . Penicillins Other (See Comments)    Childhood reaction. Has patient had a PCN reaction causing immediate rash, facial/tongue/throat swelling, SOB or lightheadedness with hypotension: unsure Has patient had a PCN reaction causing severe rash involving mucus membranes or skin necrosis: unsure Has patient had a PCN reaction that required hospitalization:No Has patient had a PCN reaction occurring within the last 10 years:No If all of the above answers are "NO", then may proceed with Cephalosporin use    Family History  Problem Relation Age of Onset  . Leukemia Mother   . Pneumonia Father     Prior to Admission medications   Medication Sig Start Date End Date Taking? Authorizing Provider  acetaminophen (TYLENOL) 325 MG tablet Take 650 mg by mouth every 6 (six) hours as needed for mild pain.    Yes [provider]  aspirin EC 81 MG tablet Take 81 mg by mouth daily.   Yes [provider]  ferrous sulfate 325 (65 FE) MG tablet Take 325 mg by mouth 2 (two) times a week.   Yes [provider]  lisinopril-hydrochlorothiazide (PRINZIDE,ZESTORETIC) 10-12.5 MG tablet Take 1 tablet by mouth daily.   Yes [provider]  Omega-3 Fatty Acids (FISH OIL PO) Take 1 capsule by mouth every evening.    Yes [provider]  thiamine (VITAMIN B-1) 100 MG tablet Take 100 mg  by mouth 2 (two) times a week.   Yes [provider]    Physical Exam: Resting in bed in no acute distress Vitals:   05/05/19 1300 05/05/19 1315 05/05/19 1330 05/05/19 1345  BP: 118/80  122/77   Pulse: 78 80 76 81  Resp: 15 12 10 16   Temp:      TempSrc:      SpO2: 98% 99% 99% 99%  Weight:      Height:         . General:  Appears calm and comfortable . Eyes:PERRL, EOMI, normal lids, iris . ENT:  grossly normal hearing, lips & tongue oral mucosa  dry  . neck: no LAD, masses or thyromegaly . Cardiovascular:  RRR, no m/r/g. No LE edema.  Marland Kitchen Respiratory:  CTA bilaterally, no w/r/r. Normal respiratory effort. . Abdomen:  soft, ntnd, NABS . Skin:  no rash or induration seen on limited exam . Musculoskeletal:  grossly normal tone BUE/BLE, good ROM, no bony abnormality . Psychiatric:  grossly normal mood and affect, speech fluent and appropriate, AOx3 Neurologic: Awake alert oriented , 5 x 5 muscle power upper and lower extremities word finding difficulty with dysarthria   labs on Admission: I have personally reviewed following labs and imaging studies  CBC: Recent Labs  Lab 05/05/19 0825  WBC 8.6  NEUTROABS 6.8  HGB 15.4  HCT 46.4  MCV 95.1  PLT 102   Basic Metabolic Panel: Recent Labs  Lab 05/05/19 0825  NA 136  K 3.8  CL 103  CO2 26  GLUCOSE 103*  BUN 34*  CREATININE 1.56*  CALCIUM 9.3   GFR: Estimated Creatinine Clearance: 45.7 mL/min (A) (by C-G formula based on SCr of 1.56 mg/dL (H)). Liver Function Tests: Recent Labs  Lab 05/05/19 0825  AST 19  ALT 20  ALKPHOS 74  BILITOT 0.8  PROT 8.0  ALBUMIN 4.7   No results for input(s): LIPASE, AMYLASE in the last 168 hours. No results for input(s): AMMONIA in the last 168 hours. Coagulation Profile: Recent Labs  Lab 05/05/19 0825  INR 1.1   Cardiac Enzymes: No results for input(s): CKTOTAL, CKMB, CKMBINDEX, TROPONINI in the last 168 hours. BNP (last 3 results) No results for input(s): PROBNP in  the last 8760 hours. HbA1C: No results for input(s): HGBA1C in the last 72 hours. CBG: No results for input(s): GLUCAP in the last 168 hours. Lipid Profile: No results for input(s): CHOL, HDL, LDLCALC, TRIG, CHOLHDL, LDLDIRECT in the last 72 hours. Thyroid Function Tests: No results for input(s): TSH, T4TOTAL, FREET4, T3FREE, THYROIDAB in the last 72 hours. Anemia Panel: No results for input(s): VITAMINB12, FOLATE, FERRITIN, TIBC, IRON, RETICCTPCT in the last 72 hours. Urine analysis:    Component Value Date/Time   COLORURINE STRAW (A) 05/05/2019 0825   APPEARANCEUR CLEAR 05/05/2019 0825   LABSPEC 1.009 05/05/2019 0825   PHURINE 6.0 05/05/2019 0825   GLUCOSEU NEGATIVE 05/05/2019 0825   HGBUR SMALL (A) 05/05/2019 0825   BILIRUBINUR NEGATIVE 05/05/2019 0825   KETONESUR NEGATIVE 05/05/2019 0825   PROTEINUR NEGATIVE 05/05/2019 0825   NITRITE NEGATIVE 05/05/2019 0825   LEUKOCYTESUR NEGATIVE 05/05/2019 0825    Creatinine Clearance: Estimated Creatinine Clearance: 45.7 mL/min (A) (by C-G formula based on SCr of 1.56 mg/dL (H)).  Sepsis Labs: @LABRCNTIP (procalcitonin:4,lacticidven:4) ) Recent Results (from the past 240 hour(s))  SARS Coronavirus 2 (CEPHEID - Performed in [place] hospital lab), Hosp Order     Status: None   Collection Time: 05/05/19  8:59 AM  Result Value Ref Range Status   SARS Coronavirus 2 NEGATIVE NEGATIVE Final    Comment: (NOTE) If result is NEGATIVE SARS-CoV-2 target nucleic acids are NOT DETECTED. The SARS-CoV-2 RNA is generally detectable in upper and lower  respiratory specimens during the acute phase of infection. The lowest  concentration of SARS-CoV-2 viral copies this assay can detect is 250  copies / mL. A negative result does not preclude SARS-CoV-2 infection  and should not be used as the sole basis for treatment or other  patient management decisions.  A negative result may occur with  improper specimen collection / handling, submission of  specimen other  than  nasopharyngeal swab, presence of viral mutation(s) within the  areas targeted by this assay, and inadequate number of viral copies  (<250 copies / mL). A negative result must be combined with clinical  observations, patient history, and epidemiological information. If result is POSITIVE SARS-CoV-2 target nucleic acids are DETECTED. The SARS-CoV-2 RNA is generally detectable in upper and lower  respiratory specimens dur ing the acute phase of infection.  Positive  results are indicative of active infection with SARS-CoV-2.  Clinical  correlation with patient history and other diagnostic information is  necessary to determine patient infection status.  Positive results do  not rule out bacterial infection or co-infection with other viruses. If result is PRESUMPTIVE POSTIVE SARS-CoV-2 nucleic acids MAY BE PRESENT.   A presumptive positive result was obtained on the submitted specimen  and confirmed on repeat testing.  While 2019 novel coronavirus  (SARS-CoV-2) nucleic acids may be present in the submitted sample  additional confirmatory testing may be necessary for epidemiological  and / or clinical management purposes  to differentiate between  SARS-CoV-2 and other Sarbecovirus currently known to infect humans.  If clinically indicated additional testing with an alternate test  methodology [phone]) is advised. The SARS-CoV-2 RNA is generally  detectable in upper and lower respiratory sp ecimens during the acute  phase of infection. The expected result is Negative. Fact Sheet for Patients:  StrictlyIdeas.no Fact Sheet for Healthcare Providers: BankingDealers.co.za This test is not yet approved or cleared by the [place] FDA and has been authorized for detection and/or diagnosis of SARS-CoV-2 by FDA under an Emergency Use Authorization (EUA).  This EUA will remain in effect (meaning this test can be used) for the  duration of the COVID-19 declaration under Section 564(b)(1) of the Act, 21 U.S.C. section 360bbb-3(b)(1), unless the authorization is terminated or revoked sooner. Performed at [facility], [place] [address], [place], [place] [address]      Radiological Exams on Admission: Ct Head Wo Contrast  Result Date: 05/05/2019 CLINICAL DATA:  Confusion and speech disturbance beginning 2 days ago. EXAM: CT HEAD WITHOUT CONTRAST TECHNIQUE: Contiguous axial images were obtained from the base of the skull through the vertex without intravenous contrast. COMPARISON:  None. FINDINGS: Brain: No acute finding by CT. The brainstem and cerebellum appear normal. There are old appearing cortical and subcortical infarctions in the frontal and parietal regions of both hemispheres. None of these are discernibly recent. No mass lesion, hemorrhage, hydrocephalus or extra-axial collection. Vascular: No abnormal vascular finding. Skull: Negative Sinuses/Orbits: Scattered retention cysts. No sign of acute sinusitis. Orbits negative. Other: None IMPRESSION: No acute finding by CT. Old bilateral frontal and parietal cortical and subcortical infarctions. Electronically Signed   By: Nelson Chimes M.D.   On: 05/05/2019 09:48   Mr Brain Wo Contrast  Result Date: 05/05/2019 CLINICAL DATA:  Confusion. Speech disturbance. Old strokes. Assess for acute finding. Abnormal head CT. EXAM: MRI HEAD WITHOUT CONTRAST TECHNIQUE: Multiplanar, multiecho pulse sequences of the brain and surrounding structures were obtained without intravenous contrast. COMPARISON:  Head CT same day FINDINGS: Brain: There is a background pattern of old cortical and subcortical infarctions and both frontal and parietal regions. There are scattered areas of acute infarction affecting the left frontal and parietal cortical and subcortical brain consistent with embolic and or watershed infarctions on the left. No evidence of mass effect or hemorrhage. No  acute infarctions seen on the right or in the posterior circulation brain. No hydrocephalus. No extra-axial collection. Vascular: There appears  to be abnormal flow in both internal carotid arteries at the skull base level. These could be chronically occluded. Vertebrobasilar system does show flow. Skull and upper cervical spine: Negative Sinuses/Orbits: Retention cyst right maxillary sinus. Orbits negative. Other: None IMPRESSION: Old cortical and subcortical infarctions in both frontal and parietal regions. Areas of acute infarction in the left frontal and parietal brain without significant swelling or any acute hemorrhage. No antegrade flow is seen in either internal carotid artery and chronic occlusion is suspected. Posterior circulation does show patency. These acute infarctions could be embolic or watershed in nature. Electronically Signed   By: Nelson Chimes M.D.   On: 05/05/2019 12:00   Dg Chest Portable 1 View  Result Date: 05/05/2019 CLINICAL DATA:  Mental status changes EXAM: PORTABLE CHEST 1 VIEW COMPARISON:  05/03/2016 FINDINGS: Bibasilar opacities, likely atelectasis. Heart is normal size. No effusions. No acute bony abnormality. IMPRESSION: Bibasilar airspace opacities, atelectasis versus early infiltrates. Electronically Signed   By: Rolm Baptise M.D.   On: 05/05/2019 08:51    EKG: Independently reviewed.   Assessment/Plan Active Problems:   * No active hospital problems. *    #1 acute stroke affecting left frontal and parietal cortical and subcortical area of the brain.  This patient has history of hypertension and hyperlipidemia.  His main issue is in forming words and unstable gait however he is able to move all his extremities and swallow.  ED physician has spoke with Dr. Cheral Marker and requested transfer to [facility] for further stroke work-up.  I have ordered an echocardiogram lipid panel TSH hemoglobin A1c.  I have not ordered a CT angiogram due to his elevated creatinine.  I will start  him on aspirin and statin.  #2 hypertension his blood pressure here is 128/81.  Hold BP meds.  #3 AKI his creatinine today is 1.56 the highest I have is 1.5 from 2017.  He does appear dehydrated I will place him on normal saline follow-up labs tomorrow.  #4 PVCs patient has a history of PVCs and shows on the telemetry check mag potassium normal check echo.   1Estimated body mass index is 25.85 kg/m as calculated from the following:   Height as of this encounter: 5\' 8"  (1.727 m).   Weight as of this encounter: 77.1 kg.   DVT prophylaxis: Lovenox Code Status: Full code Family Communication: None Disposition Plan pending clinical improvement Consults called: ED physician spoke with Dr. Aldean Jewett Admission status: Inpatient   [name] MD [facility]  If 7PM-7AM, please contact night-coverage [email or web address] Password [email or web address]  05/05/2019, 2:01 PM

## 2019-05-05 NOTE — Progress Notes (Signed)
  Echocardiogram 2D Echocardiogram has been performed.  Jose Kaufman 05/05/2019, 3:24 PM

## 2019-05-05 NOTE — ED Notes (Signed)
[facility] notified of need for transport. °

## 2019-05-06 ENCOUNTER — Encounter (HOSPITAL_COMMUNITY): Payer: Commercial Managed Care - PPO

## 2019-05-06 ENCOUNTER — Inpatient Hospital Stay (HOSPITAL_COMMUNITY): Payer: Commercial Managed Care - PPO

## 2019-05-06 DIAGNOSIS — E785 Hyperlipidemia, unspecified: Secondary | ICD-10-CM | POA: Diagnosis present

## 2019-05-06 LAB — BASIC METABOLIC PANEL
Anion gap: 10 (ref 5–15)
BUN: 28 mg/dL — ABNORMAL HIGH (ref 8–23)
CO2: 23 mmol/L (ref 22–32)
Calcium: 9.3 mg/dL (ref 8.9–10.3)
Chloride: 104 mmol/L (ref 98–111)
Creatinine, Ser: 1.44 mg/dL — ABNORMAL HIGH (ref 0.61–1.24)
GFR calc Af Amer: 59 mL/min — ABNORMAL LOW (ref >60)
GFR calc non Af Amer: 51 mL/min — ABNORMAL LOW (ref >60)
Glucose, Bld: 101 mg/dL — ABNORMAL HIGH (ref 70–99)
Potassium: 4.5 mmol/L (ref 3.5–5.1)
Sodium: 137 mmol/L (ref 135–145)

## 2019-05-06 LAB — CK: Total CK: 151 U/L (ref 49–397)

## 2019-05-06 LAB — LIPID PANEL
Cholesterol: 167 mg/dL (ref 0–200)
HDL: 40 mg/dL — ABNORMAL LOW (ref >40)
LDL Cholesterol: 113 mg/dL — ABNORMAL HIGH (ref 0–99)
Total CHOL/HDL Ratio: 4.2 RATIO
Triglycerides: 71 mg/dL (ref <150)
VLDL: 14 mg/dL (ref 0–40)

## 2019-05-06 LAB — HEMOGLOBIN A1C
Hgb A1c MFr Bld: 5.7 % — ABNORMAL HIGH (ref 4.8–5.6)
Mean Plasma Glucose: 117 mg/dL

## 2019-05-06 IMAGING — CT CT ANGIOGRAPHY NECK
2 of 7 series · 8 of 33 positions shown · IV contrast (APPLIED)
Comparison: MRI study yesterday.

CLINICAL DATA: Confusion. Speech disturbance. Old strokes.
Follow-up acute strokes left hemisphere.

EXAM:
CT ANGIOGRAPHY NECK
TECHNIQUE: Multidetector CT imaging of the neck was performed using the
standard protocol during bolus administration of intravenous
contrast. Multiplanar CT image reconstructions and MIPs were
obtained to evaluate the vascular anatomy. Carotid stenosis
measurements (when applicable) are obtained utilizing NASCET
criteria, using the distal internal carotid diameter as the
denominator.
CONTRAST:  50mL OMNIPAQUE IOHEXOL 350 MG/ML SOLN

[Series 5: cta neck · axial · 0.39mm/px · z∈[-172,-88]mm · 2 of 127 slices shown]
[im 43/127  soft-tissue]
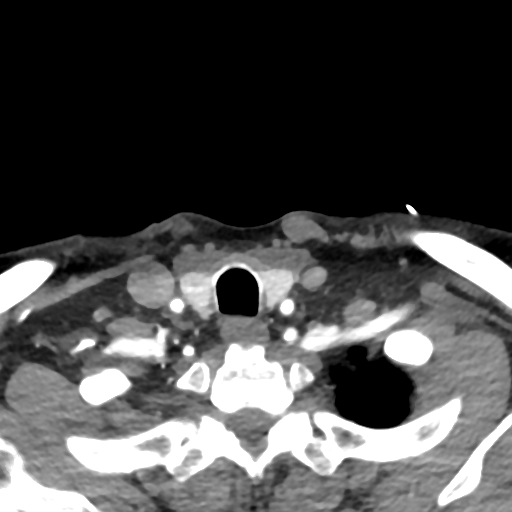
[im 85/127  soft-tissue]
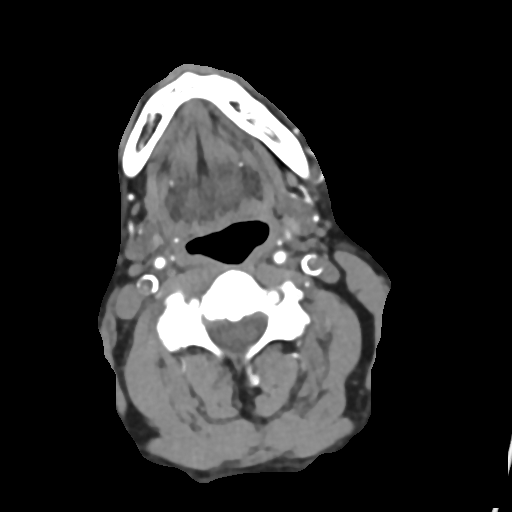

[Series 7: ax thins · axial · 0.33mm/px · z∈[-245,-78]mm · 6 of 252 slices shown]
[im 36/252  soft-tissue]
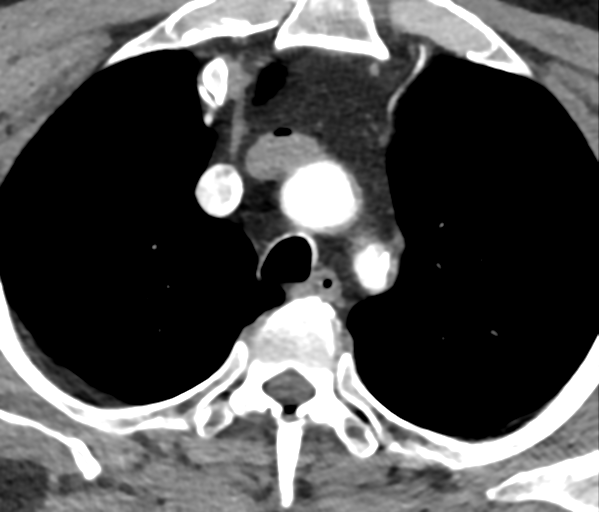
[im 72/252  bone]
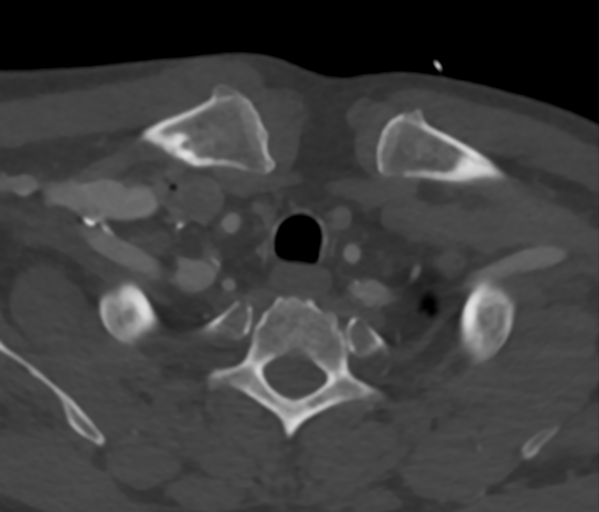
[im 108/252  soft-tissue]
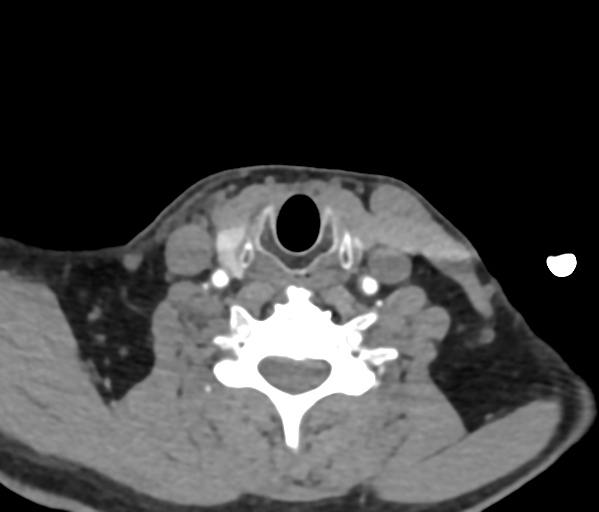
[im 144/252  bone]
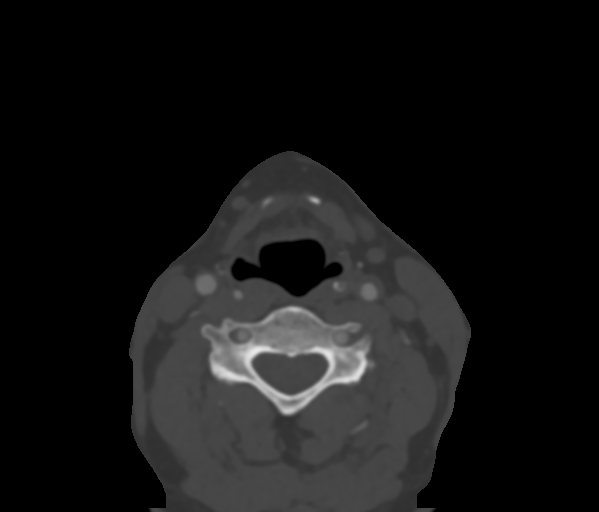
[im 180/252  soft-tissue]
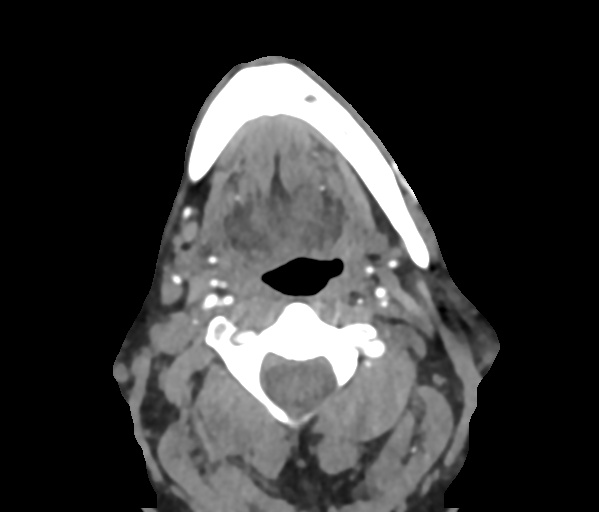
[im 216/252  bone]
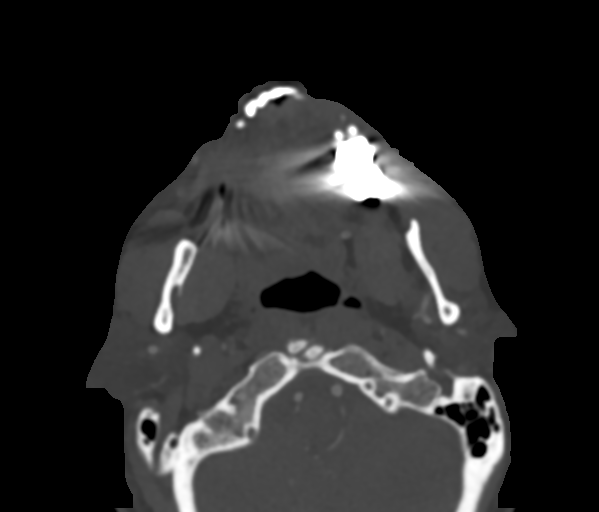

[8 of 33 positions shown; findings below may reference images not displayed]

FINDINGS: Aortic arch: Aortic atherosclerosis. No evidence of dissection.
Maximal diameter of the ascending aorta 3.7 cm.

Right carotid system: Common carotid artery is widely patent to the
bifurcation. There is near occlusion of the ICA at the origin. There
is a string sign through the region of the cervical internal carotid
artery to the skull base.

Left carotid system: Common carotid artery shows soft plaque with
stenosis 40%. At the carotid bifurcation, there is extensive plaque
with occlusion of the ICA.

Vertebral arteries: Right subclavian artery is widely patent. There
is atherosclerotic plaque at the right vertebral artery origin with
stenosis estimated at 50%. Beyond that, the right vertebral artery
is widely patent to the basilar. The left subclavian artery shows a
50% stenosis in its proximal 3 cm. There is atherosclerotic plaque
at the left vertebral artery origin with stenosis of 50%. Beyond
that, the left vertebral artery is patent to the basilar.

Skeleton: Ordinary mid cervical spondylosis.

Other neck: No mass or lymphadenopathy.

Upper chest: Negative
IMPRESSION: Advanced atherosclerotic disease at the carotid bifurcation on the
right with near occlusion the ICA. There is a patent string sign
seen to the level of the skull base.

Advanced atherosclerotic disease at the carotid bifurcation on the
left with occlusion the ICA. No string sign identified on this side.

50% stenosis at both vertebral artery origins. Long segment 50%
stenosis affecting the proximal left subclavian artery.

## 2019-05-06 MED ORDER — CLOPIDOGREL BISULFATE 75 MG PO TABS
75.0000 mg | ORAL_TABLET | Freq: Every day | ORAL | Status: DC
  Administered 2019-05-06 – 2019-05-07 (×2): 75 mg via ORAL
  Filled 2019-05-06 (×2): qty 1

## 2019-05-06 MED ORDER — IOHEXOL 350 MG/ML SOLN
50.0000 mL | Freq: Once | INTRAVENOUS | Status: AC | PRN
  Administered 2019-05-06: 50 mL via INTRAVENOUS

## 2019-05-06 NOTE — Evaluation (Signed)
Physical Therapy Evaluation Patient Details Name: Jose Kaufman MRN: [id number] DOB: 12/02/1951 Today's Date: 05/06/2019   History of Present Illness  Pt is a 68 y/o male who presented from [facility] ED with aphasia and confusion. MRI revealed old cortical and subcortical infarcts in both frontal and parietal regions, as well as areas of acute infarction in the L frontal and parietal lobes. PMH significant for PVC's, HTN, bilateral THR.  Clinical Impression  Pt admitted with above diagnosis. Pt currently with functional limitations due to the deficits listed below (see PT Problem List). At the time of PT eval pt was able to perform transfers with gross supervision for safety, however required min assist for ambulation and completion of functional tasks. Noted significant slow processing and motor apraxia when planning how to perform stand>sit to the recliner chair, enter bathroom, lift gown up to urinate, and then exit bathroom. PTA pt independent and working full time. Recommend CIR level therapies to maximize functional independence and decrease risk for falls upon return home with family support. Acutely, pt will benefit from skilled PT to increase their independence and safety with mobility to allow discharge to the venue listed below.       Follow Up Recommendations CIR;Supervision/Assistance - 24 hour    Equipment Recommendations  None recommended by PT(TBD by next venue of care)    Recommendations for Other Services Rehab consult     Precautions / Restrictions Precautions Precautions: Fall Restrictions Weight Bearing Restrictions: No      Mobility  Bed Mobility Overal bed mobility: Needs Assistance Bed Mobility: Supine to Sit     Supine to sit: Supervision     General bed mobility comments: Increased time and effort required. Therapist had to assist with linen management, but pt otherwise able to transition to EOB at a supervision level.   Transfers Overall transfer level: Needs  assistance Equipment used: None Transfers: Sit to/from Stand Sit to Stand: Supervision         General transfer comment: Supervision for safety as pt powered up to full standing position.   Ambulation/Gait Ambulation/Gait assistance: Min assist Gait Distance (Feet): 150 Feet Assistive device: None Gait Pattern/deviations: Step-through pattern;Decreased stride length;Trunk flexed;Drifts right/left;Narrow base of support Gait velocity: Decreased Gait velocity interpretation: <1.8 ft/sec, indicate of risk for recurrent falls General Gait Details: Pt with short step/stride length and an almost shuffling gait pattern. Pt drifting L in hall and required occasional assist to avoid obstacles.   Stairs            Wheelchair Mobility    Modified Rankin (Stroke Patients Only) Modified Rankin (Stroke Patients Only) Pre-Morbid Rankin Score: No symptoms Modified Rankin: Moderately severe disability     Balance Overall balance assessment: Needs assistance Sitting-balance support: Feet supported;No upper extremity supported Sitting balance-Leahy Scale: Fair     Standing balance support: No upper extremity supported;During functional activity Standing balance-Leahy Scale: Poor Standing balance comment: Requires hands-on guarding for safety.                              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 Decreased coordination noted with functional tasks.  RLE Sensation: WNL RLE Coordination: decreased fine motor;decreased gross motor    Cervical / Trunk Assessment Cervical / Trunk Assessment: Normal(Forward head posture with rounded shoulders)  Communication   Communication: Expressive difficulties  Cognition Arousal/Alertness: Awake/alert Behavior During Therapy: WFL for tasks assessed/performed Overall Cognitive Status: Impaired/Different from baseline Area of Impairment: Attention;Following commands;Safety/judgement;Awareness;Problem solving                   Current Attention Level: Selective   Following Commands: Follows one step commands consistently(Does not follow multi-step commands) Safety/Judgement: Decreased awareness of safety;Decreased awareness of deficits Awareness: Emergent Problem Solving: Slow processing;Decreased initiation;Difficulty sequencing;Requires verbal cues;Requires tactile cues General Comments: A/O x4. Requires increased time and multiple cues for pt to initiate a task. Motor apraxia.       General Comments      Exercises     Assessment/Plan    PT Assessment Patient needs continued PT services  PT Problem List Decreased strength;Decreased activity tolerance;Decreased balance;Decreased mobility;Decreased knowledge of use of DME;Decreased safety awareness;Decreased knowledge of precautions;Decreased coordination       PT Treatment Interventions DME instruction;Gait training;Functional mobility training;Therapeutic activities;Therapeutic exercise;Neuromuscular re-education;Patient/family education;Cognitive remediation    PT Goals (Current  goals can be found in the Care Plan section)  Acute Rehab PT Goals Patient Stated Goal: Return to work PT Goal Formulation: With patient Time For Goal Achievement: 05/20/19 Potential to Achieve Goals: Good    Frequency Min 4X/week   Barriers to discharge        Co-evaluation               AM-PAC PT "6 Clicks" Mobility  Outcome Measure Help needed turning from your back to your side while in a flat bed without using bedrails?: None Help needed moving from lying on your back to sitting on the side of a flat bed without using bedrails?: A Little Help needed moving to and from a bed to a chair (including a wheelchair)?: A Little Help needed standing up from a chair using your arms (e.g., wheelchair or bedside chair)?: A Little Help needed to walk in hospital room?: A Little Help needed climbing 3-5 steps with a railing? : A Little 6 Click Score: 19    End of Session Equipment Utilized During Treatment: Gait belt Activity Tolerance: Patient tolerated treatment well Patient left: in chair;with call bell/phone within reach;Other (comment)(OT present in room) Nurse Communication: Mobility status PT Visit Diagnosis: Other abnormalities of gait and mobility (R26.89);Other symptoms and signs involving the nervous system (R29.898);Apraxia (R48.2)    Time: 1696-7893 PT Time Calculation (min) (ACUTE ONLY): 29 min   Charges:   PT Evaluation $PT Eval Moderate Complexity: 1 Mod PT Treatments $Gait Training: 8-22 mins        Rolinda Roan, PT, DPT Acute Rehabilitation Services Pager: [phone] Office: [phone]   Thelma Comp 05/06/2019, 1:55 PM

## 2019-05-06 NOTE — Evaluation (Signed)
Occupational Therapy Evaluation Patient Details Name: Jose Kaufman MRN: [id number] DOB: February 22, 1951 Today's Date: 05/06/2019    History of Present Illness Pt is a 68 y/o male who presented from [facility] ED with aphasia and confusion. MRI revealed old cortical and subcortical infarcts in both frontal and parietal regions, as well as areas of acute infarction in the L frontal and parietal lobes. PMH significant for PVC's, HTN, bilateral THR.   Clinical Impression   Pt was independent prior to admission and working full time. Presents with decreased motor planning, impaired cognition and decreased balance. He requires up to min assist for ADL and mobility. Pt has excellent potential to return to a modified independent level with intensive rehab. Recommending CIR.    Follow Up Recommendations  CIR    Equipment Recommendations  Other (comment)(defer to next venue)    Recommendations for Other Services       Precautions / Restrictions Precautions Precautions: Fall Restrictions Weight Bearing Restrictions: No      Mobility Bed Mobility Overal bed mobility: Needs Assistance Bed Mobility: Supine to Sit     Supine to sit: Supervision     General bed mobility comments: Increased time and effort required. Therapist had to assist with linen management, but pt otherwise able to transition to EOB at a supervision level.   Transfers Overall transfer level: Needs assistance Equipment used: None Transfers: Sit to/from Stand Sit to Stand: Supervision         General transfer comment: Supervision for safety, pt tentative, gets wound up in IV line, needing multimodal cues     Balance Overall balance assessment: Needs assistance Sitting-balance support: Feet supported;No upper extremity supported Sitting balance-Leahy Scale: Good Sitting balance - Comments: no LOB with donning socks   Standing balance support: No upper extremity supported;During functional activity Standing balance-Leahy  Scale: Fair Standing balance comment: statically min guard assist, hands on for dynamic                           ADL either performed or assessed with clinical judgement   ADL Overall ADL's : Needs assistance/impaired Eating/Feeding: Set up;Sitting   Grooming: Wash/dry hands;Brushing hair;Standing;Min guard Grooming Details (indicate cue type and reason): verbal cues for sequencing and thoroughness Upper Body Bathing: Minimal assistance;Sitting   Lower Body Bathing: Minimal assistance;Sit to/from stand   Upper Body Dressing : Minimal assistance;Sitting   Lower Body Dressing: Minimal assistance;Sit to/from stand   Toilet Transfer: Minimal assistance;Ambulation;Regular Toilet   Toileting- Clothing Manipulation and Hygiene: Minimal assistance;Sit to/from stand       Functional mobility during ADLs: Minimal assistance General ADL Comments: min assist primarily for cues for sequencing, increased time to complete all activities, pt able to order lunch with assist to use phone     Vision Baseline Vision/History: No visual deficits Patient Visual Report: No change from baseline       Perception     Praxis      Pertinent Vitals/Pain Pain Assessment: No/denies pain     Hand Dominance Right   Extremity/Trunk Assessment Upper Extremity Assessment Upper Extremity Assessment: RUE deficits/detail;LUE deficits/detail RUE Coordination: decreased fine motor;decreased gross motor LUE Coordination: decreased fine motor;decreased gross motor   Lower Extremity Assessment Lower Extremity Assessment: Defer to PT evaluation RLE Deficits / Details: Bilaterally, strength 4+/5 and pt does not endorse sensation changes. Decreased coordination noted with functional tasks.  RLE Sensation: WNL RLE Coordination: decreased fine motor;decreased gross motor   Cervical / Trunk Assessment  Cervical / Trunk Assessment: Normal(Forward head posture with rounded shoulders)   Communication  Communication Communication: Expressive difficulties   Cognition Arousal/Alertness: Awake/alert Behavior During Therapy: WFL for tasks assessed/performed Overall Cognitive Status: Impaired/Different from baseline Area of Impairment: Attention;Following commands;Safety/judgement;Awareness;Problem solving                   Current Attention Level: Selective   Following Commands: Follows one step commands consistently(does not follow multistep) Safety/Judgement: Decreased awareness of safety;Decreased awareness of deficits Awareness: Emergent Problem Solving: Slow processing;Decreased initiation;Difficulty sequencing;Requires verbal cues;Requires tactile cues General Comments: A/O x4. Requires increased time and multiple cues for pt to initiate a task. Motor apraxia.    General Comments       Exercises     Shoulder Instructions      Home Living Family/patient expects to be discharged to:: Private residence Living Arrangements: Alone Available Help at Discharge: Family;Available PRN/intermittently Type of Home: House Home Access: Stairs to enter [employer] of Steps: 3 Entrance Stairs-Rails: Right;Left;Can reach both Home Layout: One level     Bathroom Shower/Tub: Occupational psychologist: Standard     Home Equipment: Environmental consultant - 2 wheels;Bedside commode;Shower seat      Lives With: Alone    Prior Functioning/Environment Level of Independence: Independent        Comments: Works in the Engineer, 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[place] Problem List: Impaired balance (sitting and/or standing);Decreased coordination;Decreased cognition;Decreased safety awareness;Decreased knowledge of use of DME or AE;Impaired UE functional use      OT Treatment/Interventions: Self-care/ADL training;DME and/or AE instruction;Patient/family education;Balance training;Therapeutic activities    OT Goals(Current goals can be found in the care plan section) Acute Rehab  OT Goals Patient Stated Goal: Return to work OT Goal Formulation: With patient Time For Goal Achievement: 05/20/19 Potential to Achieve Goals: Good  OT Frequency: Min 3X/week   Barriers to D/C:            Co-evaluation              AM-PAC OT "6 Clicks" Daily Activity     Outcome Measure Help from another person eating meals?: None Help from another person taking care of personal grooming?: A Little Help from another person toileting, which includes using toliet, bedpan, or urinal?: A Little Help from another person bathing (including washing, rinsing, drying)?: A Little Help from another person to put on and taking off regular upper body clothing?: A Little Help from another person to put on and taking off regular lower body clothing?: A Little 6 Click Score: 19   End of Session Equipment Utilized During Treatment: Gait belt  Activity Tolerance: Patient tolerated treatment well Patient left: in chair;with call bell/phone within reach;with chair alarm set(ST in room)  OT Visit Diagnosis: Unsteadiness on feet (R26.81);Other abnormalities of gait and mobility (R26.89);Apraxia (R48.2);Other symptoms and signs involving cognitive function                Time: 9381-0175 OT Time Calculation (min): 30 min Charges:  OT General Charges $OT Visit: 1 Visit OT Evaluation $OT Eval Moderate Complexity: 1 Mod OT Treatments $Self Care/Home Management : 8-22 mins  Nestor Lewandowsky, OTR/L Acute Rehabilitation Services Pager: [phone] Office: [phone] Malka So 05/06/2019, 3:11 PM

## 2019-05-06 NOTE — Progress Notes (Addendum)
STROKE TEAM PROGRESS NOTE   HISTORY OF PRESENT ILLNESS (per Dr Cheral Marker) Pt is a 68 y.o. male who presented to [name] ED for evaluation of new onset aphasia. He states his symptoms began on 05/02/19 with slurred speech, difficulty with speech comprehension and difficulty with word finding and BLE weakness. Chart review reveals that he also has endorsed confusion and disorientation since last week. MRI of the brain revealed old cortical and subcortical infarctions in both frontal and parietal regions. Areas of acute infarction in the left frontal and parietal brain.    SUBJECTIVE (INTERVAL HISTORY) Pt tells me he though he had a "heat stroke" on 5/23 since symptoms began while mowing yard in the middle of the afternoon while over 80 degrees. He felt like he was dehydrated, so he drank and rested, but symptoms did not get better.    OBJECTIVE Vitals:   05/05/19 2332 05/06/19 0330 05/06/19 0739 05/06/19 1150  BP: 110/78 121/82 123/74 126/66  Pulse: 70 72 85 86  Resp: 18 18 16 18   Temp: 98.1 F (36.7 C) 98.1 F (36.7 C) 98.4 F (36.9 C) 98.1 F (36.7 C)  TempSrc: Oral Oral Oral Oral  SpO2: 98% 100% 98% 99%  Weight:      Height:        CBC:  Recent Labs  Lab 05/05/19 0825  WBC 8.6  NEUTROABS 6.8  HGB 15.4  HCT 46.4  MCV 95.1  PLT 161    Basic Metabolic Panel:  Recent Labs  Lab 05/05/19 0825 05/06/19 1132  NA 136 137  K 3.8 4.5  CL 103 104  CO2 26 23  GLUCOSE 103* 101*  BUN 34* 28*  CREATININE 1.56* 1.44*  CALCIUM 9.3 9.3  MG 2.2  --     Lipid Panel: No results found for: CHOL, TRIG, HDL, CHOLHDL, VLDL, LDLCALC HgbA1c:  Lab Results  Component Value Date   HGBA1C 5.7 (H) 05/05/2019   Urine Drug Screen:     Component Value Date/Time   LABOPIA NONE DETECTED 05/05/2019 0825   COCAINSCRNUR NONE DETECTED 05/05/2019 0825   LABBENZ NONE DETECTED 05/05/2019 0825   AMPHETMU NONE DETECTED 05/05/2019 0825   THCU NONE DETECTED 05/05/2019 0825   LABBARB NONE  DETECTED 05/05/2019 0825    Alcohol Level     Component Value Date/Time   ETH <10 05/05/2019 0825    IMAGING   Ct Head Wo Contrast  Result Date: 05/05/2019 CLINICAL DATA:  Confusion and speech disturbance beginning 2 days ago. EXAM: CT HEAD WITHOUT CONTRAST TECHNIQUE: Contiguous axial images were obtained from the base of the skull through the vertex without intravenous contrast. COMPARISON:  None. FINDINGS: Brain: No acute finding by CT. The brainstem and cerebellum appear normal. There are old appearing cortical and subcortical infarctions in the frontal and parietal regions of both hemispheres. None of these are discernibly recent. No mass lesion, hemorrhage, hydrocephalus or extra-axial collection. Vascular: No abnormal vascular finding. Skull: Negative Sinuses/Orbits: Scattered retention cysts. No sign of acute sinusitis. Orbits negative. Other: None IMPRESSION: No acute finding by CT. Old bilateral frontal and parietal cortical and subcortical infarctions. Electronically Signed   By: Nelson Chimes M.D.   On: 05/05/2019 09:48   Mr Brain Wo Contrast  Result Date: 05/05/2019 CLINICAL DATA:  Confusion. Speech disturbance. Old strokes. Assess for acute finding. Abnormal head CT. EXAM: MRI HEAD WITHOUT CONTRAST TECHNIQUE: Multiplanar, multiecho pulse sequences of the brain and surrounding structures were obtained without intravenous contrast. COMPARISON:  Head CT same day  FINDINGS: Brain: There is a background pattern of old cortical and subcortical infarctions and both frontal and parietal regions. There are scattered areas of acute infarction affecting the left frontal and parietal cortical and subcortical brain consistent with embolic and or watershed infarctions on the left. No evidence of mass effect or hemorrhage. No acute infarctions seen on the right or in the posterior circulation brain. No hydrocephalus. No extra-axial collection. Vascular: There appears to be abnormal flow in both internal  carotid arteries at the skull base level. These could be chronically occluded. Vertebrobasilar system does show flow. Skull and upper cervical spine: Negative Sinuses/Orbits: Retention cyst right maxillary sinus. Orbits negative. Other: None IMPRESSION: Old cortical and subcortical infarctions in both frontal and parietal regions. Areas of acute infarction in the left frontal and parietal brain without significant swelling or any acute hemorrhage. No antegrade flow is seen in either internal carotid artery and chronic occlusion is suspected. Posterior circulation does show patency. These acute infarctions could be embolic or watershed in nature. Electronically Signed   By: Nelson Chimes M.D.   On: 05/05/2019 12:00   Dg Chest Portable 1 View  Result Date: 05/05/2019 CLINICAL DATA:  Mental status changes EXAM: PORTABLE CHEST 1 VIEW COMPARISON:  05/03/2016 FINDINGS: Bibasilar opacities, likely atelectasis. Heart is normal size. No effusions. No acute bony abnormality. IMPRESSION: Bibasilar airspace opacities, atelectasis versus early infiltrates. Electronically Signed   By: Rolm Baptise M.D.   On: 05/05/2019 08:51    Transthoracic Echocardiogram  00/00/2020 Pending  PHYSICAL EXAM Blood pressure 126/66, pulse 86, temperature 98.1 F (36.7 C), temperature source Oral, resp. rate 18, height 5\' 8"  (1.727 m), weight 76.1 kg, SpO2 99 %.  GEN-frail middle-aged Caucasian male no acute distress HEENT-  Normocephalic, normal conjunctiva Lungs- Respirations unlabored Extremities- No edema  Neurological Examination Mental Status: A&Ox4, conversation and memory intact. No anomia with objects, but there is word-finding difficulty during conversation w/hesitant speech at times Mild dysarthria. Can follow simple commands, but has difficulty with more complex commands such as cerebellar testing, which required showing him and assistance from me.  Cranial Nerves: II: Visual fields intact bilaterally, all 4  quadrants. PERRL.  III,IV, VI: Mild left ptosis. EOMI without nystagmus.  VII: No facial droop.  VIII: hearing intact to voice IX,X: No hoarseness or hypophonia XI: Head is midline XII: midline tongue extension Motor: Right :  Upper extremity   5/5                                      Left:     Upper extremity   5/5             Lower extremity   5/5                                                  Lower extremity   5/5 There is mildly increased extensor tone at left elbow and increased flexor tone at left wrist.  Subtle left pronator drift noted.   Diminished fine finger movements on the right.  T.  Orbits left  over right upper extremity.  Mild rightt grip weakness. Sensory: Temp intact x 4. Positive for extinction to RUE with DSS.  Deep Tendon Reflexes: wnl. Right toe downgoing. Left toe upgoing  Cerebellar: No ataxia with FNF or HTS bilaterally, but has difficulty following commands.  Gait: Deferred  HOME MEDICATIONS:  Medications Prior to Admission  Medication Sig Dispense Refill  . acetaminophen (TYLENOL) 325 MG tablet Take 650 mg by mouth every 6 (six) hours as needed for mild pain.     Marland Kitchen aspirin EC 81 MG tablet Take 81 mg by mouth daily.    . ferrous sulfate 325 (65 FE) MG tablet Take 325 mg by mouth 2 (two) times a week.    Marland Kitchen lisinopril-hydrochlorothiazide (PRINZIDE,ZESTORETIC) 10-12.5 MG tablet Take 1 tablet by mouth daily.    . Omega-3 Fatty Acids (FISH OIL PO) Take 1 capsule by mouth every evening.     . thiamine (VITAMIN B-1) 100 MG tablet Take 100 mg by mouth 2 (two) times a week.        HOSPITAL MEDICATIONS:  . aspirin  325 mg Oral Daily  . atorvastatin  80 mg Oral q1800  . enoxaparin (LOVENOX) injection  40 mg Subcutaneous Q24H    ALLERGIES Allergies  Allergen Reactions  . Penicillins Other (See Comments)    Childhood reaction. Has patient had a PCN reaction causing immediate rash, facial/tongue/throat swelling, SOB or lightheadedness with hypotension:  unsure Has patient had a PCN reaction causing severe rash involving mucus membranes or skin necrosis: unsure Has patient had a PCN reaction that required hospitalization:No Has patient had a PCN reaction occurring within the last 10 years:No If all of the above answers are "NO", then may proceed with Cephalosporin use   ASSESSMENT/PLAN Mr. Thong Feeny is a 68 y.o. male with history of HTN, HLD, dysrhythmia presenting with acute left fronto-parietal strokes. MRI also reveals no antegrade flow in either ICA, with chronic occlusions suspected. Watershed mechanism for the strokes is therefore suspected in the setting of dehydration and hypotension and working outdoors in the heat  Stroke  Resultant- expressive and receptive dysphasia,  CT head- nothing acute seen  MRI head -old strokes bilat frontal.paretal regions. New cortical infarcts in left frontal/parietal.   CTA H&N -Left ICA occlusion; Right String sign. IR consulted for Angiogram and possible stenting in am  2D Echo - pending  Sars Corona Virus 2 neg  LDL - pending  HgbA1c - 5.7  UDS - neg   VTE prophylaxis - Lovenox  Diet per SLP  81mg  ASA daily prior to admission, now on ASA daily + Plavix x 3weeks, further recs pending Angiogram.   Patient counseled to be compliant with his antithrombotic medications  Ongoing aggressive stroke risk factor management  Therapy recommendations: Pending  Disposition:  Pending  Hypertension  Stable . Permissive hypertension (OK if < 220/120) but gradually normalize in 5-7 days . Long-term BP goal normotensive  Hyperlipidemia  Lipid lowering medication PTA:  Omega 3  LDL pending, goal < 70  Current lipid lowering medication:Lipitor 80mg  daily  Continue statin at discharge  No known Diabetes  Other Stroke Risk Factors  Previous Cigarette smoker  Hx stroke/TIA  Hospital day # 1  Desiree Metzger-Cihelka, ARNP-C, ANVP-BC  I have personally obtained  history,examined this patient, reviewed notes, independently viewed imaging studies, participated in medical decision making and plan of care.ROS completed by me personally and pertinent positives fully documented  I have made any additions or clarifications directly to the above note.  He has presented with watershed left hemispheric infarcts in the setting of dehydration and hypotension with bilateral near occlusive extracranial carotid stenosis.  Recommend check diagnostic cerebral catheter angiogram tomorrow and consider  early revascularization if possible.  Maintain systolic blood pressure greater than 120 and avoid hypotension and maintain adequate hydration.  Patient will need aggressive risk factor modification and dual antiplatelet therapy of aspirin and Plavix for 3 months.  He remains at risk for recurrent strokes.  Discussed with Dr. Lonny Prude.  And Dr. Estanislado Pandy.  Greater than 50% time during this 35-minute visit was spent on counseling and coordination of care about his strokes and discussion about carotid stenosis and revascularization and stroke prevention.  Antony Contras, MD Medical Director [place] Pager: [phone] 05/06/2019 6:11 PM  To contact Stroke Continuity provider, please refer to [email or web address] After hours, contact General Neurology

## 2019-05-06 NOTE — Progress Notes (Signed)
SLP Cancellation Note  Patient Details Name: Chinedu Agustin MRN: [id number] DOB: 11-07-1951   Cancelled treatment:       Reason Eval/Treat Not Completed: Patient at procedure or test/unavailable(Pt currently being assessed by therapy. SLP will re-attempt.)  Samyrah Bruster I. Hardin Negus, [place], [place] Office number [phone] Pager [place] 05/06/2019, 10:20 AM

## 2019-05-06 NOTE — Progress Notes (Signed)
  Speech Language Pathology  Patient Details Name: Jose Kaufman MRN: [id number] DOB: [birth year] Today's Date: 05/06/2019 Time:  -     Pt passed the [place] swallow screen with nursing therefore ST will not perform bedside swallow. Requested and received order for speech-language-cognitive.                 GO                [place] Siren 05/06/2019, 8:14 AM  [name] Colvin Caroli.Ed Risk analyst [phone] Office [phone]

## 2019-05-06 NOTE — Evaluation (Signed)
Speech Language Pathology Evaluation Patient Details Name: Jose Kaufman MRN: [id number] DOB: 1951/03/04 Today's Date: 05/06/2019 Time: 6606-3016 SLP Time Calculation (min) (ACUTE ONLY): 21 min  Problem List:  Patient Active Problem List   Diagnosis Date Noted  . CVA (cerebral vascular accident) ([place]) 05/05/2019  . Internal carotid artery occlusion, bilateral   . Covid-19 Virus not Detected   . Status post total replacement of left hip 08/23/2017  . Unilateral primary osteoarthritis, left hip 05/02/2017  . Status post total replacement of right hip 03/22/2017  . PVCs (premature ventricular contractions) 03/14/2017  . Essential hypertension 03/14/2017  . Unilateral primary osteoarthritis, right hip 03/08/2017  . Bilateral hip pain 10/15/2016  . Abnormality of gait 10/15/2016   Past Medical History:  Past Medical History:  Diagnosis Date  . Abnormality of gait 10/15/2016  . Arthritis   . Dysrhythmia    hx of  . Essential hypertension 03/14/2017  . PVCs (premature ventricular contractions) 03/14/2017   Past Surgical History:  Past Surgical History:  Procedure Laterality Date  . TOTAL HIP ARTHROPLASTY Right 03/22/2017   Procedure: RIGHT TOTAL HIP ARTHROPLASTY ANTERIOR APPROACH;  Surgeon: Mcarthur Rossetti, MD;  Location: [facility];  Service: Orthopedics;  Laterality: Right;  . TOTAL HIP ARTHROPLASTY     Left 08/23/17 Dr. Zollie Beckers  . TOTAL HIP ARTHROPLASTY Left 08/23/2017   Procedure: LEFT TOTAL HIP ARTHROPLASTY ANTERIOR APPROACH;  Surgeon: Mcarthur Rossetti, MD;  Location: [facility];  Service: Orthopedics;  Laterality: Left;   HPI:  Pt is a 68 y.o. male who presented to [name] ED for evaluation of new onset aphasia. He states his symptoms began on 05/02/19 with slurred speech, difficulty with speech comprehension and difficulty with word finding and BLE weakness. Chart review reveals that he also has endorsed confusion and disorientation since last week. MRI of the  brain revealed old cortical and subcortical infarctions in both frontal and parietal regions. Areas of acute infarction in the left frontal and parietal brain.    Assessment / Plan / Recommendation Clinical Impression  Pt presents with mild expressive aphasia characterized by impaired word retrieval during conversation, difficulty with spelling, and reduced auditory comprehension at the paragraph level. He was able to follow two and three-step commands with additional processing time and/or occasional repetition, and demonstrated some difficulty with comprehension of complex material. Expressively, he required additional processing time to retrieve lower frequency words and exhibited difficulty with written expression at the word level. Continued skilled SLP services are clinically indicated at this time to improve aphasia.     SLP Assessment  SLP Recommendation/Assessment: Patient needs continued Speech Lanaguage Pathology Services SLP Visit Diagnosis: Aphasia (R47.01)    Follow Up Recommendations  Inpatient Rehab    Frequency and Duration min 2x/week  2 weeks      SLP Evaluation Cognition  Overall Cognitive Status: Difficult to assess(Due to auditory comprehension deficits) Arousal/Alertness: Awake/alert Orientation Level: Oriented to person;Oriented to place;Oriented to situation(Oriented to month, day, date not year) Attention: Focused;Sustained Focused Attention: Appears intact Sustained Attention: Appears intact       Comprehension  Auditory Comprehension Overall Auditory Comprehension: Impaired Basic Biographical Questions: (5/5) Complex Questions: (4/5) Commands: (3/4; 4/4 with repetition) Two Step Basic Commands: (3/4; 4/4 with repetition) Reading Comprehension Reading Status: Within funtional limits    Expression Expression Primary Mode of Expression: Verbal Verbal Expression Overall Verbal Expression: Appears within functional limits for tasks assessed Initiation:  No impairment Automatic Speech: Counting;Day of week;Month of year Level of Generative/Spontaneous Verbalization:  Sentence Repetition: No impairment(5/5) Naming: No impairment Responsive: (5/5 with additional time) Confrontation: Within functional limits(10/10) Convergent: (Sentence completion: 5/5) Pragmatics: No impairment Written Expression Dominant Hand: Right Written Expression: Exceptions to [facility] Dictation Ability: Letter Self Formulation Ability: Letter   Oral / Motor  Oral Motor/Sensory Function Overall Oral Motor/Sensory Function: Within functional limits Motor Speech Overall Motor Speech: Appears within functional limits for tasks assessed Respiration: Within functional limits Phonation: Normal Resonance: Within functional limits Articulation: Within functional limitis Intelligibility: Intelligible Motor Planning: Witnin functional limits Motor Speech Errors: Not applicable   Vidhi Delellis I. Hardin Negus, [place], [place] Office number [phone] Pager [place] 05/06/2019, 11:40 AM

## 2019-05-06 NOTE — Progress Notes (Signed)
[facility] PROGRESS NOTE  Jose Kaufman ALP:[id number] DOB: 01/01/1951 DOA: 05/05/2019 PCP: Michel Harrow, PA-C  Assessment/Plan: 1. Watershed left hemispheric infarct in setting of dehydration hypotension.  Imaging shows near occlusive extracranial carotid stenosis diagnostic evaluation with cerebral catheter angiogram plan on 5/28, .  Maintain SBP greater than 120 and avoid hypotension per neurology recommendations so we will continue IV fluids given patient was recently dehydrated on presentation and high risk for worsening symptoms.  Continue aspirin and Plavix for 3 months.  At high risk for recurrent strokes.Speech evaluating language given expressive/receptive aphasia 2. Hypertension, normotensive here.  Range blood pressure last 24 hours 112/80-136/66.  Will hold home BP medications, continue IV fluids as mentioned above 3. HLD, stable.  Started on Lipitor, LDL 113.  Code Status: Full code Family Communication: No family at bedside (indicate person spoken with, relationship, and if by phone, the number) Disposition Plan: Plan for cerebral catheter angiogram for evaluation of carotid stenosis on 5/28, continue IV fluids to maintain normotensive blood pressures and avoiding hypotension in setting of watershed infarct.   Consultants:  Neurology  Procedures:    Antibiotics:  None (indicate start date, and stop date if known)  HPI/Subjective:  Jose Kaufman is a 68 y.o. year old male with medical history significant for HTN, HLD who presented on 05/05/2019 with slurred speech, difficulty with comprehension and word finding and lower extremity weakness that he noticed on 5/23 normal in the yard which he initially attributed to "heat stroke" and was found to have acute infarct of left frontoparietal brain as well as old cortical/subcortical infarcts..  No new weakness, no chest pain No shortness of breath, no cough  Objective: Vitals:   05/06/19 1644 05/06/19 1950  BP:  121/88 112/80  Pulse: 75 80  Resp: 16 18  Temp: 98.6 F (37 C) 98.1 F (36.7 C)  SpO2: 99% 95%    Intake/Output Summary (Last 24 hours) at 05/06/2019 2209 Last data filed at 05/06/2019 2000 Gross per 24 hour  Intake 950 ml  Output 1000 ml  Net -50 ml   Filed Weights   05/05/19 0825 05/05/19 2144  Weight: 77.1 kg 76.1 kg    Exam:   General: Elderly male, no distress  Cardiovascular: Regular rate and rhythm, no edema  Respiratory: Normal respiratory effort on room air  Abdomen: Soft, nontender, normal bowel sounds  Musculoskeletal: Normal range of motion  Skin no rashes or lesions  Neurologic alert oriented x4, mild dysarthria, slight left facial droop, strength intact bilaterally  Data Reviewed: Basic Metabolic Panel: Recent Labs  Lab 05/05/19 0825 05/06/19 1132  NA 136 137  K 3.8 4.5  CL 103 104  CO2 26 23  GLUCOSE 103* 101*  BUN 34* 28*  CREATININE 1.56* 1.44*  CALCIUM 9.3 9.3  MG 2.2  --    Liver Function Tests: Recent Labs  Lab 05/05/19 0825  AST 19  ALT 20  ALKPHOS 74  BILITOT 0.8  PROT 8.0  ALBUMIN 4.7   No results for input(s): LIPASE, AMYLASE in the last 168 hours. No results for input(s): AMMONIA in the last 168 hours. CBC: Recent Labs  Lab 05/05/19 0825  WBC 8.6  NEUTROABS 6.8  HGB 15.4  HCT 46.4  MCV 95.1  PLT 216   Cardiac Enzymes: Recent Labs  Lab 05/06/19 1132  CKTOTAL 151   BNP (last 3 results) No results for input(s): BNP in the last 8760 hours.  ProBNP (last 3 results) No results for input(s): PROBNP in  the last 8760 hours.  CBG: No results for input(s): GLUCAP in the last 168 hours.  Recent Results (from the past 240 hour(s))  SARS Coronavirus 2 (CEPHEID - Performed in [place] hospital lab), Hosp Order     Status: None   Collection Time: 05/05/19  8:59 AM  Result Value Ref Range Status   SARS Coronavirus 2 NEGATIVE NEGATIVE Final    Comment: (NOTE) If result is NEGATIVE SARS-CoV-2 target nucleic  acids are NOT DETECTED. The SARS-CoV-2 RNA is generally detectable in upper and lower  respiratory specimens during the acute phase of infection. The lowest  concentration of SARS-CoV-2 viral copies this assay can detect is 250  copies / mL. A negative result does not preclude SARS-CoV-2 infection  and should not be used as the sole basis for treatment or other  patient management decisions.  A negative result may occur with  improper specimen collection / handling, submission of specimen other  than nasopharyngeal swab, presence of viral mutation(s) within the  areas targeted by this assay, and inadequate number of viral copies  (<250 copies / mL). A negative result must be combined with clinical  observations, patient history, and epidemiological information. If result is POSITIVE SARS-CoV-2 target nucleic acids are DETECTED. The SARS-CoV-2 RNA is generally detectable in upper and lower  respiratory specimens dur ing the acute phase of infection.  Positive  results are indicative of active infection with SARS-CoV-2.  Clinical  correlation with patient history and other diagnostic information is  necessary to determine patient infection status.  Positive results do  not rule out bacterial infection or co-infection with other viruses. If result is PRESUMPTIVE POSTIVE SARS-CoV-2 nucleic acids MAY BE PRESENT.   A presumptive positive result was obtained on the submitted specimen  and confirmed on repeat testing.  While 2019 novel coronavirus  (SARS-CoV-2) nucleic acids may be present in the submitted sample  additional confirmatory testing may be necessary for epidemiological  and / or clinical management purposes  to differentiate between  SARS-CoV-2 and other Sarbecovirus currently known to infect humans.  If clinically indicated additional testing with an alternate test  methodology [phone]) is advised. The SARS-CoV-2 RNA is generally  detectable in upper and lower respiratory  sp ecimens during the acute  phase of infection. The expected result is Negative. Fact Sheet for Patients:  StrictlyIdeas.no Fact Sheet for Healthcare Providers: BankingDealers.co.za This test is not yet approved or cleared by the [place] FDA and has been authorized for detection and/or diagnosis of SARS-CoV-2 by FDA under an Emergency Use Authorization (EUA).  This EUA will remain in effect (meaning this test can be used) for the duration of the COVID-19 declaration under Section 564(b)(1) of the Act, 21 U.S.C. section 360bbb-3(b)(1), unless the authorization is terminated or revoked sooner. Performed at [facility], [place] [address], [place], [place] [address]      Studies: Ct Head Wo Contrast  Result Date: 05/05/2019 CLINICAL DATA:  Confusion and speech disturbance beginning 2 days ago. EXAM: CT HEAD WITHOUT CONTRAST TECHNIQUE: Contiguous axial images were obtained from the base of the skull through the vertex without intravenous contrast. COMPARISON:  None. FINDINGS: Brain: No acute finding by CT. The brainstem and cerebellum appear normal. There are old appearing cortical and subcortical infarctions in the frontal and parietal regions of both hemispheres. None of these are discernibly recent. No mass lesion, hemorrhage, hydrocephalus or extra-axial collection. Vascular: No abnormal vascular finding. Skull: Negative Sinuses/Orbits: Scattered retention cysts. No sign of  acute sinusitis. Orbits negative. Other: None IMPRESSION: No acute finding by CT. Old bilateral frontal and parietal cortical and subcortical infarctions. Electronically Signed   By: Nelson Chimes M.D.   On: 05/05/2019 09:48   Ct Angio Neck W Or Wo Contrast  Result Date: 05/06/2019 CLINICAL DATA:  Confusion. Speech disturbance. Old strokes. Follow-up acute strokes left hemisphere. EXAM: CT ANGIOGRAPHY NECK TECHNIQUE: Multidetector CT imaging of the neck  was performed using the standard protocol during bolus administration of intravenous contrast. Multiplanar CT image reconstructions and MIPs were obtained to evaluate the vascular anatomy. Carotid stenosis measurements (when applicable) are obtained utilizing NASCET criteria, using the distal internal carotid diameter as the denominator. CONTRAST:  68mL OMNIPAQUE IOHEXOL 350 MG/ML SOLN COMPARISON:  MRI study yesterday. FINDINGS: Aortic arch: Aortic atherosclerosis. No evidence of dissection. Maximal diameter of the ascending aorta 3.7 cm. Right carotid system: Common carotid artery is widely patent to the bifurcation. There is near occlusion of the ICA at the origin. There is a string sign through the region of the cervical internal carotid artery to the skull base. Left carotid system: Common carotid artery shows soft plaque with stenosis 40%. At the carotid bifurcation, there is extensive plaque with occlusion of the ICA. Vertebral arteries: Right subclavian artery is widely patent. There is atherosclerotic plaque at the right vertebral artery origin with stenosis estimated at 50%. Beyond that, the right vertebral artery is widely patent to the basilar. The left subclavian artery shows a 50% stenosis in its proximal 3 cm. There is atherosclerotic plaque at the left vertebral artery origin with stenosis of 50%. Beyond that, the left vertebral artery is patent to the basilar. Skeleton: Ordinary mid cervical spondylosis. Other neck: No mass or lymphadenopathy. Upper chest: Negative IMPRESSION: Advanced atherosclerotic disease at the carotid bifurcation on the right with near occlusion the ICA. There is a patent string sign seen to the level of the skull base. Advanced atherosclerotic disease at the carotid bifurcation on the left with occlusion the ICA. No string sign identified on this side. 50% stenosis at both vertebral artery origins. Long segment 50% stenosis affecting the proximal left subclavian artery.  Electronically Signed   By: Nelson Chimes M.D.   On: 05/06/2019 16:18   Mr Brain Wo Contrast  Result Date: 05/05/2019 CLINICAL DATA:  Confusion. Speech disturbance. Old strokes. Assess for acute finding. Abnormal head CT. EXAM: MRI HEAD WITHOUT CONTRAST TECHNIQUE: Multiplanar, multiecho pulse sequences of the brain and surrounding structures were obtained without intravenous contrast. COMPARISON:  Head CT same day FINDINGS: Brain: There is a background pattern of old cortical and subcortical infarctions and both frontal and parietal regions. There are scattered areas of acute infarction affecting the left frontal and parietal cortical and subcortical brain consistent with embolic and or watershed infarctions on the left. No evidence of mass effect or hemorrhage. No acute infarctions seen on the right or in the posterior circulation brain. No hydrocephalus. No extra-axial collection. Vascular: There appears to be abnormal flow in both internal carotid arteries at the skull base level. These could be chronically occluded. Vertebrobasilar system does show flow. Skull and upper cervical spine: Negative Sinuses/Orbits: Retention cyst right maxillary sinus. Orbits negative. Other: None IMPRESSION: Old cortical and subcortical infarctions in both frontal and parietal regions. Areas of acute infarction in the left frontal and parietal brain without significant swelling or any acute hemorrhage. No antegrade flow is seen in either internal carotid artery and chronic occlusion is suspected. Posterior circulation does show patency. These acute  infarctions could be embolic or watershed in nature. Electronically Signed   By: Nelson Chimes M.D.   On: 05/05/2019 12:00   Dg Chest Portable 1 View  Result Date: 05/05/2019 CLINICAL DATA:  Mental status changes EXAM: PORTABLE CHEST 1 VIEW COMPARISON:  05/03/2016 FINDINGS: Bibasilar opacities, likely atelectasis. Heart is normal size. No effusions. No acute bony abnormality.  IMPRESSION: Bibasilar airspace opacities, atelectasis versus early infiltrates. Electronically Signed   By: Rolm Baptise M.D.   On: 05/05/2019 08:51    Scheduled Meds: . aspirin  325 mg Oral Daily  . atorvastatin  80 mg Oral q1800  . clopidogrel  75 mg Oral Daily  . enoxaparin (LOVENOX) injection  40 mg Subcutaneous Q24H   Continuous Infusions: . sodium chloride 100 mL/hr at 05/06/19 1254    Active Problems:   CVA (cerebral vascular accident) ([place])      [facility]

## 2019-05-06 NOTE — Progress Notes (Signed)
Rehab Admissions Coordinator Note:  Patient was screened by Cleatrice Burke for appropriateness for an Inpatient Acute Rehab Consult per PT and SLP recs.  At this time, we are recommending Inpatient Rehab consult if pt would like to be considered for admit. Please advise.   Cleatrice Burke RN MSN 05/06/2019, 2:09 PM  I can be reached at [phone].

## 2019-05-06 NOTE — Progress Notes (Signed)
  Speech Language Pathology Treatment: Cognitive-Linquistic(Aphasia)  Patient Details Name: Jose Kaufman MRN: [id number] DOB: 02/02/1951 Today's Date: 05/06/2019 Time: 9024-0973 SLP Time Calculation (min) (ACUTE ONLY): 16 min  Assessment / Plan / Recommendation Clinical Impression  Pt was seen for aphasia treatment and was cooperative throughout the session without complaint of pain. He was educated regarding aphasia and nature and severity of his deficits. 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 He provided 6-11 items per concrete category during divergent naming tasks when mod cues were provided. He achieved 80% accuracy with responsive naming increasing to 100% accuracy with min-mod cues. He required moderate cues to identify and sequence the correct letters to spell individual words. SLP will continue to follow pt.    HPI HPI: Pt is a 68 y.o. male who presented to [facility] for evaluation of new onset aphasia. He states his symptoms began on 05/02/19 with slurred speech, difficulty with speech comprehension and difficulty with word finding and BLE weakness. Chart review reveals that he also has endorsed confusion and disorientation since last week. MRI of the brain revealed old cortical and subcortical infarctions in both frontal and parietal regions. Areas of acute infarction in the left frontal and parietal brain.       SLP Plan     Patient needs continued Speech Lanaguage Pathology Services    Recommendations                   Follow up Recommendations: Inpatient Rehab SLP Visit Diagnosis: Aphasia (R47.01)       Jose Kaufman I. Hardin Negus, [place], [place] Office number [phone] Pager [place] 05/06/2019, 11:46 AM

## 2019-05-07 ENCOUNTER — Encounter (HOSPITAL_COMMUNITY): Payer: Commercial Managed Care - PPO

## 2019-05-07 ENCOUNTER — Other Ambulatory Visit: Payer: Self-pay | Admitting: Student

## 2019-05-07 ENCOUNTER — Inpatient Hospital Stay (HOSPITAL_COMMUNITY): Payer: Commercial Managed Care - PPO

## 2019-05-07 HISTORY — PX: IR ANGIO VERTEBRAL SEL SUBCLAVIAN INNOMINATE BILAT MOD SED: IMG5366

## 2019-05-07 HISTORY — PX: IR ANGIO INTRA EXTRACRAN SEL COM CAROTID INNOMINATE BILAT MOD SED: IMG5360

## 2019-05-07 LAB — BASIC METABOLIC PANEL
Anion gap: 8 (ref 5–15)
BUN: 25 mg/dL — ABNORMAL HIGH (ref 8–23)
CO2: 27 mmol/L (ref 22–32)
Calcium: 9.1 mg/dL (ref 8.9–10.3)
Chloride: 105 mmol/L (ref 98–111)
Creatinine, Ser: 1.35 mg/dL — ABNORMAL HIGH (ref 0.61–1.24)
GFR calc Af Amer: >60 mL/min (ref >60)
GFR calc non Af Amer: 55 mL/min — ABNORMAL LOW (ref >60)
Glucose, Bld: 98 mg/dL (ref 70–99)
Potassium: 4.4 mmol/L (ref 3.5–5.1)
Sodium: 140 mmol/L (ref 135–145)

## 2019-05-07 IMAGING — XA BILATERAL COMMON CAROTID AND INNOMINATE ANGIOGRAPHY
1 series · 12 of 24 positions shown · IV contrast (IODINE)
Comparison: MRI of the brain of [DATE] and CT angiogram of the
head and neck of [DATE].

CLINICAL DATA: New onset aphasia and mild right-sided weakness.
Abnormal MRI of the brain.

EXAM:
IR ANGIO VERTEBRAL SEL VERTEBRAL BILAT MOD SED; BILATERAL COMMON
CAROTID AND INNOMINATE ANGIOGRAPHY
TECHNIQUE: Informed written consent was obtained from the patient after a
thorough discussion of the procedural risks, benefits and
alternatives. All questions were addressed. Maximal Sterile Barrier
Technique was utilized including caps, mask, sterile gowns, sterile
gloves, sterile drape, hand hygiene and skin antiseptic. A timeout
was performed prior to the initiation of the procedure.

[Series 300: dr. (person_name) · 12 of 266 slices shown]
[im 12/266]
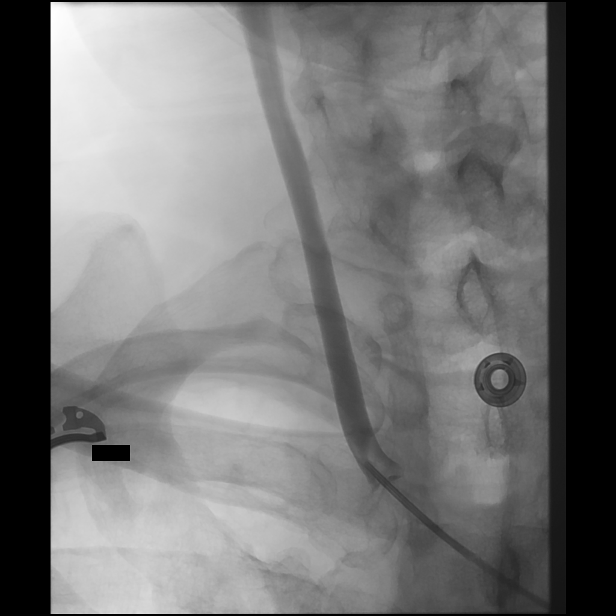
[im 35/266]
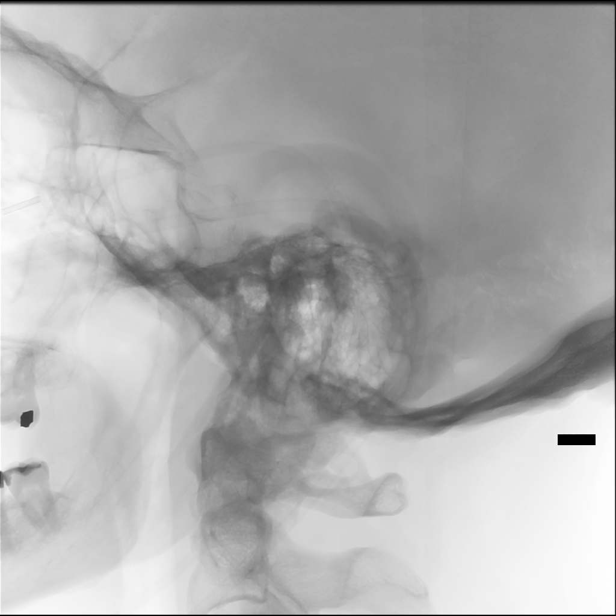
[im 58/266]
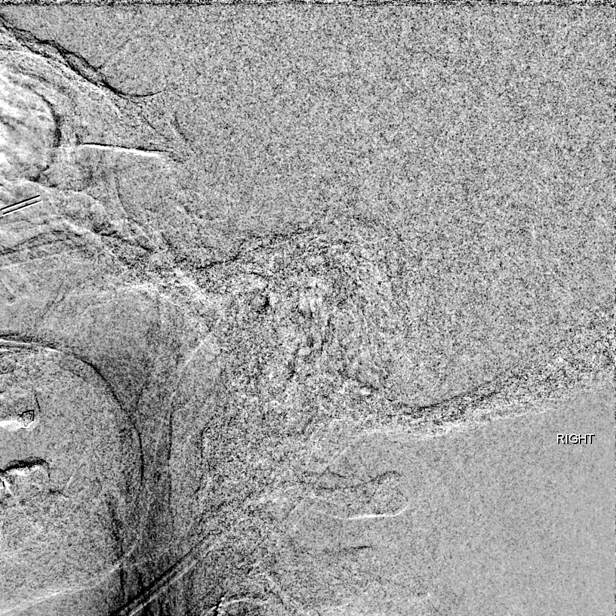
[im 81/266]
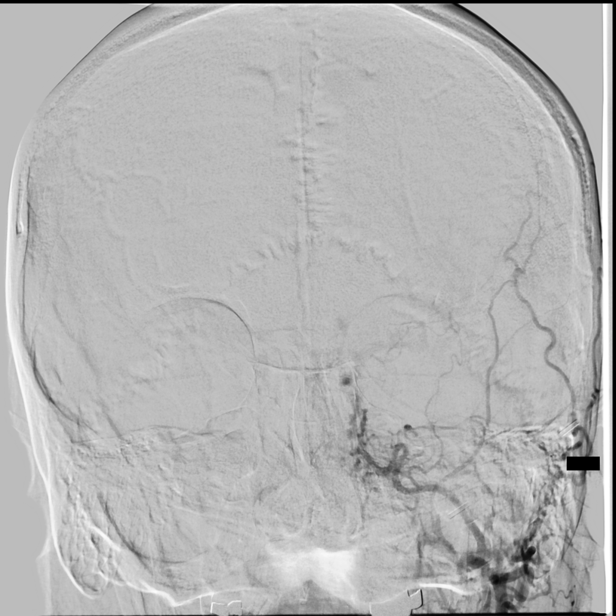
[im 104/266]
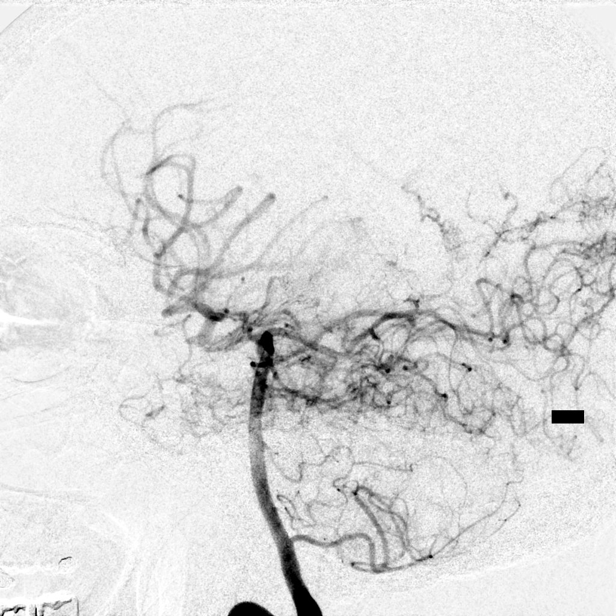
[im 127/266]
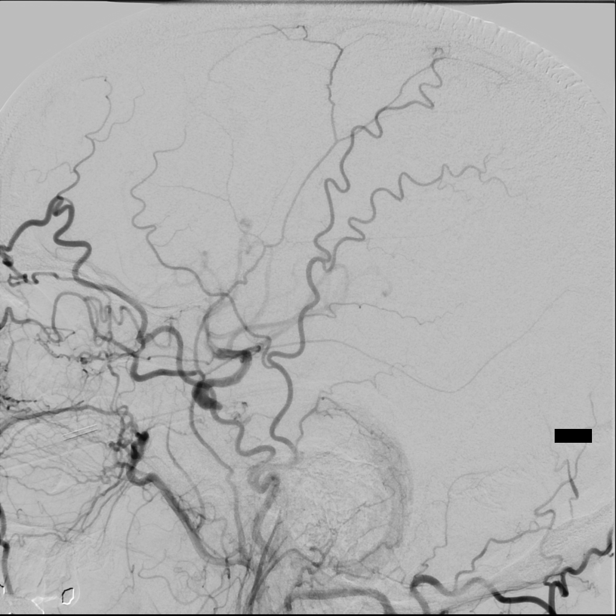
[im 150/266]
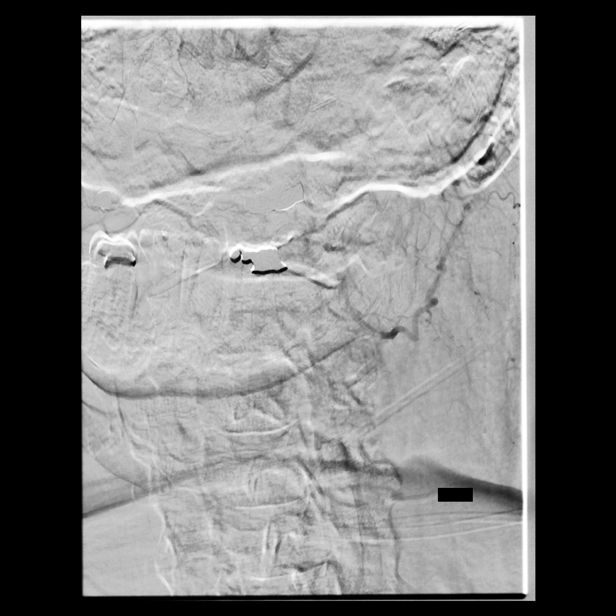
[im 173/266]
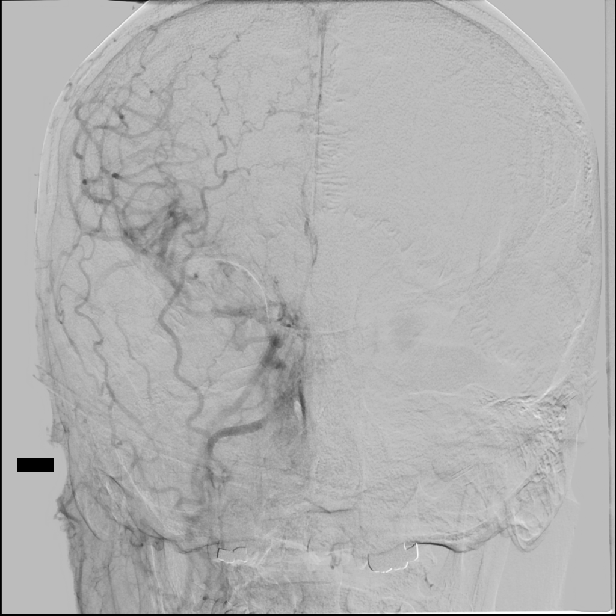
[im 196/266]
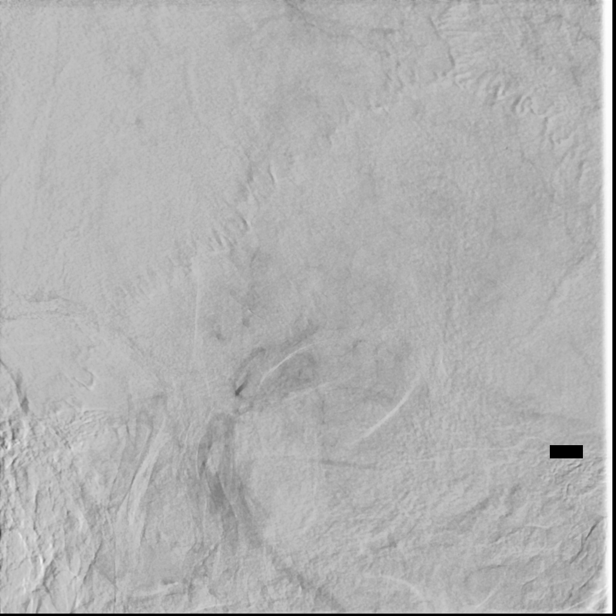
[im 219/266]
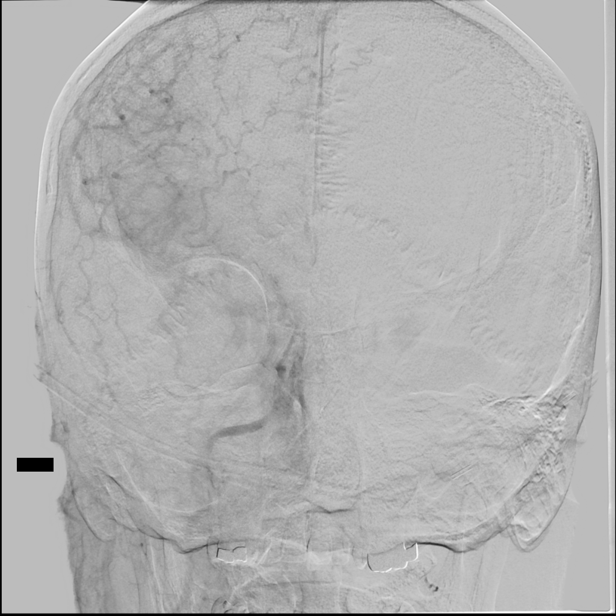
[im 242/266]
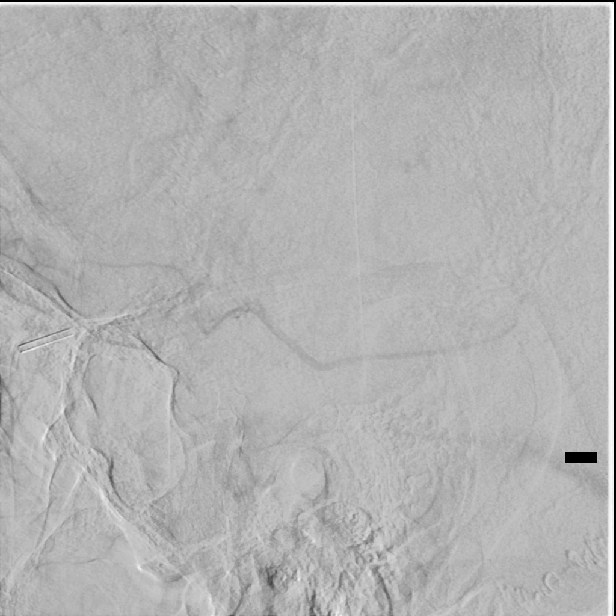
[im 266/266]
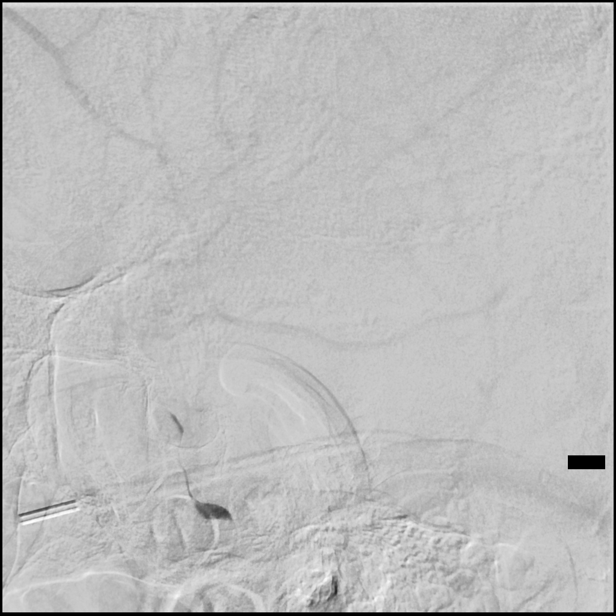

[12 of 24 positions shown; findings below may reference images not displayed]

MEDICATIONS:
Heparin [PD] units IV; no. The antibiotic was administered within 1
hour of the procedure.

ANESTHESIA/SEDATION:
Versed 1 mg IV; Fentanyl 25 mcg IV

Moderate Sedation Time:  38 minutes

The patient was continuously monitored during the procedure by the
interventional radiology nurse under my direct supervision.

CONTRAST:  Isovue 300 approximately 60 mL.

FLUOROSCOPY TIME:  Fluoroscopy Time: 13 minutes 54 seconds ([PD]
mGy).

COMPLICATIONS:
None immediate.
The right groin was prepped and draped in the usual sterile fashion.
Thereafter using modified Seldinger technique, transfemoral access
into the right common femoral artery was obtained without
difficulty. Over a 0.035 inch guidewire, a 5 French Pinnacle sheath
was inserted. Through this, and also over 0.035 inch guidewire, a 5
French JB 1 catheter was advanced to the aortic arch region and
selectively positioned in the right common carotid artery, right
subclavian artery, the left common carotid artery and the left
subclavian artery.
FINDINGS: The right common carotid arteriogram demonstrates the right external
carotid artery and its major branches to be widely patent.

The right internal carotid artery at the bulb to the cranial skull
base has a smooth string sign. The vessel is contiguous with the
petrous cavernous junction of the right internal carotid artery.

Distally the supraclinoid segment is widely patent. Opacification is
noted of the right middle cerebral artery and the right anterior
cerebral artery into the capillary and venous phases. Patchy
leptomeningeal and left pial collaterals seen applying the cortical
surface of the parasagittal regions of the right cerebral convexity.

The right vertebral artery origin demonstrates severe pre occlusive
stenosis. Antegrade flow is seen in the vertebral artery to the
cranial skull base. Wide patency is seen of the right
vertebrobasilar junction and the right posterior-inferior cerebellar
artery.

The opacified portion of the basilar artery, the posterior cerebral
arteries, the superior cerebellar arteries and the anterior-inferior
cerebellar arteries is grossly intact into the capillary and venous
phases. Unopacified blood is seen in the basilar artery from the
contralateral vertebral artery.

There is retrograde opacification via a left posterior communicating
artery of the left MCA distribution. Partial collateralization is
also noted from leptomeningeal collaterals arising from the right
posterior temporal branch of the right posterior cerebral artery.

The left vertebral artery origin demonstrates severe pre occlusive
stenosis.

The vessel is seen to opacify to the cranial skull base.

Patency is seen of the left posterior inferior cerebellar artery and
the left vertebrobasilar junction.

The basilar artery, the posterior cerebral arteries, the superior
cerebellar arteries and the anterior-inferior cerebellar arteries
opacify into the capillary and venous phases.

Retrograde opacification via the left posterior communicating artery
is seen of the left middle cerebral artery distribution.

Also demonstrated are leptomeningeal collaterals arising from the
right posterior cerebral artery posterior temporal branch supplying
the cortical regions right temporal lobe and the right parietal lobe
regions. The delayed arterial phase subsequently also demonstrates
retrograde opacification of the anterior cerebral arteries
bilaterally with subsequent retrograde opacification into the right
MCA distribution cortical branches.

The left common carotid arteriogram demonstrates nonvisualization of
the left internal carotid artery at the bulb or at the cranial skull
base. There is no evidence of a angiographic string sign on the
delayed arterial phase.

Partial retrograde opacification of the left vertebral artery at the
level of C1 is seen via the left occipital artery. Intracranially
there is partial reconstitution of the left internal carotid artery
cavernous segment and supraclinoid segment. Minimal flow is noted
into the left MCA distribution. These are via the prominent
anastomosis via the artery of the foramen of rotundum, and of the
foramen ovale.
IMPRESSION: Angiographically occluded left internal carotid artery extra
cranially with partial reconstitution from the external carotid
artery branches as described above.

Severe high-grade stenosis of the right internal carotid artery
proximally with delayed angiographic string sign being contiguous
with the proximal cavernous segment of the right internal carotid
artery.

Severe high-grade pre occlusive stenosis of both vertebral artery
origins.

PLAN:
Findings were discussed with the patient and the referring stroke
neurologist.

## 2019-05-07 MED ORDER — SODIUM CHLORIDE 0.9 % IV SOLN
INTRAVENOUS | Status: AC
  Administered 2019-05-07: 18:00:00 via INTRAVENOUS

## 2019-05-07 MED ORDER — IOHEXOL 300 MG/ML  SOLN
50.0000 mL | Freq: Once | INTRAMUSCULAR | Status: AC | PRN
  Administered 2019-05-07: 15 mL via INTRA_ARTERIAL

## 2019-05-07 MED ORDER — IOHEXOL 300 MG/ML  SOLN
150.0000 mL | Freq: Once | INTRAMUSCULAR | Status: AC | PRN
  Administered 2019-05-07: 75 mL via INTRA_ARTERIAL

## 2019-05-07 MED ORDER — MIDAZOLAM HCL 2 MG/2ML IJ SOLN
INTRAMUSCULAR | Status: AC | PRN
  Administered 2019-05-07: 1 mg via INTRAVENOUS

## 2019-05-07 MED ORDER — MIDAZOLAM HCL 2 MG/2ML IJ SOLN
INTRAMUSCULAR | Status: AC
  Filled 2019-05-07: qty 2

## 2019-05-07 MED ORDER — LIDOCAINE HCL 1 % IJ SOLN
INTRAMUSCULAR | Status: AC | PRN
  Administered 2019-05-07: 10 mL

## 2019-05-07 MED ORDER — FENTANYL CITRATE (PF) 100 MCG/2ML IJ SOLN
INTRAMUSCULAR | Status: AC | PRN
  Administered 2019-05-07: 25 ug via INTRAVENOUS

## 2019-05-07 MED ORDER — LIDOCAINE HCL 1 % IJ SOLN
INTRAMUSCULAR | Status: AC
  Filled 2019-05-07: qty 20

## 2019-05-07 MED ORDER — HEPARIN SODIUM (PORCINE) 1000 UNIT/ML IJ SOLN
INTRAMUSCULAR | Status: AC | PRN
  Administered 2019-05-07: 1000 [IU] via INTRAVENOUS

## 2019-05-07 MED ORDER — HEPARIN SODIUM (PORCINE) 1000 UNIT/ML IJ SOLN
INTRAMUSCULAR | Status: AC
  Filled 2019-05-07: qty 1

## 2019-05-07 MED ORDER — FENTANYL CITRATE (PF) 100 MCG/2ML IJ SOLN
INTRAMUSCULAR | Status: AC
  Filled 2019-05-07: qty 2

## 2019-05-07 NOTE — Sedation Documentation (Signed)
Pt arrived to [facility]

## 2019-05-07 NOTE — Progress Notes (Signed)
Inpatient Rehab Admissions:  Inpatient Rehab Consult received.  I met with pt at the bedside for rehabilitation assessment and to discuss goals and expectations of an inpatient rehab admission.  Pt interested in CIR. Pt stated he may need to have surgery again tomorrow?-will await clarification regarding medical workup prior to opening insurance for possible CIR admit.   Please call if questions.   Jhonnie Garner, OTR/L  Rehab Admissions Coordinator  [phone] 05/07/2019 4:13 PM

## 2019-05-07 NOTE — Progress Notes (Signed)
[facility] PROGRESS NOTE  Jose Kaufman ZOX:[id number] DOB: 1951/01/04 DOA: 05/05/2019 PCP: Michel Harrow, PA-C  Assessment/Plan: 1. Watershed left hemispheric infarct in setting of dehydration hypotension.  Imaging shows near occlusive extracranial carotid stenosis, diagnostic evaluation with cerebral catheter angiogram confirms, plan for possible revascularization of right ICA stenosis on 5/29, NPO at midnight, hold lovenox, continue aspirin and plavix, repeat COPD testing given need for general anesthesia, .  Maintain SBP greater than 120 and avoid hypotension per neurology recommendations so we will continue IV fluids given patient was recently dehydrated on presentation and high risk for worsening symptoms.  Continue aspirin and Plavix for 3 months.  At high risk for recurrent strokes.Speech evaluating language given expressive/receptive aphasia 2. Hypertension, normotensive here.  Range blood pressure last 24 hours 112/80-136/66.  Will hold home BP medications, continue IV fluids as mentioned above 3. HLD, stable.  Started on Lipitor, LDL 113.  Code Status: Full code Family Communication: No family at bedside (indicate person spoken with, relationship, and if by phone, the number) Disposition Plan: Plan for attempt at R ICA revascularization, NPO at midnight, PT recommends CIR   Consultants:  Neurology  Procedures: Cerebral angiogram, 5/28: Findings. 1.Occluded left ICA prox. 2.Severe preocclusive stenosis of right ICA prox.  3. 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 year old male with medical history significant for HTN, HLD who presented on 05/05/2019 with slurred speech, difficulty with comprehension and word finding and lower extremity weakness that he noticed on 5/23 normal in the yard which he initially attributed to  "heat stroke" and was found to have acute infarct of left frontoparietal brain as well as old cortical/subcortical infarcts..  No new weakness, no chest pain No shortness of breath, no cough   Objective: Vitals:   05/07/19 1526 05/07/19 1549  BP: 131/79 125/73  Pulse: 75 74  Resp: 19 17  Temp:  98.1 F (36.7 C)  SpO2:  98%    Intake/Output Summary (Last 24 hours) at 05/07/2019 1708 Last data filed at 05/07/2019 1211 Gross per 24 hour  Intake -  Output 1250 ml  Net -1250 ml   Filed Weights   05/05/19 0825 05/05/19 2144  Weight: 77.1 kg 76.1 kg    Exam:   General: Elderly male, no distress  Cardiovascular: Regular rate and rhythm, no edema  Respiratory: Normal respiratory effort on room air  Abdomen: Soft, nontender, normal bowel sounds  Musculoskeletal: Normal range of motion  Skin no rashes or lesions  Neurologic alert oriented x4, mild dysarthria some expressive aphasia, slight left facial droop, strength intact bilaterally in all extremities  Data Reviewed: Basic Metabolic Panel: Recent Labs  Lab 05/05/19 0825 05/06/19 1132 05/07/19 0635  NA 136 137 140  K 3.8 4.5 4.4  CL 103 104 105  CO2 26 23 27   GLUCOSE 103* 101* 98  BUN 34* 28* 25*  CREATININE 1.56* 1.44* 1.35*  CALCIUM 9.3 9.3 9.1  MG 2.2  --   --    Liver Function Tests: Recent Labs  Lab 05/05/19 0825  AST 19  ALT 20  ALKPHOS 74  BILITOT 0.8  PROT 8.0  ALBUMIN 4.7   No results for input(s): LIPASE, AMYLASE in the last 168 hours. No results for input(s): AMMONIA in the last 168 hours. CBC: Recent Labs  Lab 05/05/19 0825  WBC 8.6  NEUTROABS 6.8  HGB 15.4  HCT 46.4  MCV 95.1  PLT 216   Cardiac Enzymes: Recent Labs  Lab 05/06/19 1132  CKTOTAL 151   BNP (last 3 results) No results for input(s): BNP in the last 8760 hours.  ProBNP (last 3 results) No results for input(s): PROBNP in the last 8760 hours.  CBG: No results for input(s): GLUCAP in the last 168  hours.  Recent Results (from the past 240 hour(s))  SARS Coronavirus 2 (CEPHEID - Performed in [place] hospital lab), Hosp Order     Status: None   Collection Time: 05/05/19  8:59 AM  Result Value Ref Range Status   SARS Coronavirus 2 NEGATIVE NEGATIVE Final    Comment: (NOTE) If result is NEGATIVE SARS-CoV-2 target nucleic acids are NOT DETECTED. The SARS-CoV-2 RNA is generally detectable in upper and lower  respiratory specimens during the acute phase of infection. The lowest  concentration of SARS-CoV-2 viral copies this assay can detect is 250  copies / mL. A negative result does not preclude SARS-CoV-2 infection  and should not be used as the sole basis for treatment or other  patient management decisions.  A negative result may occur with  improper specimen collection / handling, submission of specimen other  than nasopharyngeal swab, presence of viral mutation(s) within the  areas targeted by this assay, and inadequate number of viral copies  (<250 copies / mL). A negative result must be combined with clinical  observations, patient history, and epidemiological information. If result is POSITIVE SARS-CoV-2 target nucleic acids are DETECTED. The SARS-CoV-2 RNA is generally detectable in upper and lower  respiratory specimens dur ing the acute phase of infection.  Positive  results are indicative of active infection with SARS-CoV-2.  Clinical  correlation with patient history and other diagnostic information is  necessary to determine patient infection status.  Positive results do  not rule out bacterial infection or co-infection with other viruses. If result is PRESUMPTIVE POSTIVE SARS-CoV-2 nucleic acids MAY BE PRESENT.   A presumptive positive result was obtained on the submitted specimen  and confirmed on repeat testing.  While 2019 novel coronavirus  (SARS-CoV-2) nucleic acids may be present in the submitted sample  additional confirmatory testing may be necessary for  epidemiological  and / or clinical management purposes  to differentiate between  SARS-CoV-2 and other Sarbecovirus currently known to infect humans.  If clinically indicated additional testing with an alternate test  methodology [phone]) is advised. The SARS-CoV-2 RNA is generally  detectable in upper and lower respiratory sp ecimens during the acute  phase of infection. The expected result is Negative. Fact Sheet for Patients:  StrictlyIdeas.no Fact Sheet for Healthcare Providers: BankingDealers.co.za This test is not yet approved or cleared by the [place] FDA and has been authorized for detection and/or diagnosis of SARS-CoV-2 by FDA under an Emergency Use Authorization (EUA).  This EUA will remain in effect (meaning this test can be used) for the duration of the COVID-19 declaration under Section 564(b)(1) of the Act, 21 U.S.C. section 360bbb-3(b)(1), unless the authorization is terminated or revoked sooner. Performed at [facility], [place] [address] [place], [place] [address]      Studies: Ct Angio Neck W Or Wo Contrast  Result Date: 05/06/2019 CLINICAL DATA:  Confusion. Speech disturbance. Old strokes. Follow-up acute strokes left hemisphere. EXAM: CT ANGIOGRAPHY NECK TECHNIQUE: Multidetector CT imaging of the neck was performed using the standard protocol during bolus administration of intravenous contrast. Multiplanar CT image reconstructions and  MIPs were obtained to evaluate the vascular anatomy. Carotid stenosis measurements (when applicable) are obtained utilizing NASCET criteria, using the distal internal carotid diameter as the denominator. CONTRAST:  65mL OMNIPAQUE IOHEXOL 350 MG/ML SOLN COMPARISON:  MRI study yesterday. FINDINGS: Aortic arch: Aortic atherosclerosis. No evidence of dissection. Maximal diameter of the ascending aorta 3.7 cm. Right carotid system: Common carotid artery is widely patent to  the bifurcation. There is near occlusion of the ICA at the origin. There is a string sign through the region of the cervical internal carotid artery to the skull base. Left carotid system: Common carotid artery shows soft plaque with stenosis 40%. At the carotid bifurcation, there is extensive plaque with occlusion of the ICA. Vertebral arteries: Right subclavian artery is widely patent. There is atherosclerotic plaque at the right vertebral artery origin with stenosis estimated at 50%. Beyond that, the right vertebral artery is widely patent to the basilar. The left subclavian artery shows a 50% stenosis in its proximal 3 cm. There is atherosclerotic plaque at the left vertebral artery origin with stenosis of 50%. Beyond that, the left vertebral artery is patent to the basilar. Skeleton: Ordinary mid cervical spondylosis. Other neck: No mass or lymphadenopathy. Upper chest: Negative IMPRESSION: Advanced atherosclerotic disease at the carotid bifurcation on the right with near occlusion the ICA. There is a patent string sign seen to the level of the skull base. Advanced atherosclerotic disease at the carotid bifurcation on the left with occlusion the ICA. No string sign identified on this side. 50% stenosis at both vertebral artery origins. Long segment 50% stenosis affecting the proximal left subclavian artery. Electronically Signed   By: Nelson Chimes M.D.   On: 05/06/2019 16:18    Scheduled Meds: . aspirin  325 mg Oral Daily  . atorvastatin  80 mg Oral q1800  . clopidogrel  75 mg Oral Daily  . fentaNYL      . heparin      . lidocaine      . midazolam       Continuous Infusions: . sodium chloride 100 mL/hr at 05/07/19 0034  . sodium chloride      Active Problems:   Essential hypertension   CVA (cerebral vascular accident) ([place])   Hyperlipidemia      Desiree Hane  [facility]

## 2019-05-07 NOTE — Progress Notes (Signed)
Physical Therapy Treatment Patient Details Name: Jose Kaufman MRN: [id number] DOB: September 04, 1951 Today's Date: 05/07/2019    History of Present Illness Pt is a 68 y/o male who presented from [facility] ED with aphasia and confusion. MRI revealed old cortical and subcortical infarcts in both frontal and parietal regions, as well as areas of acute infarction in the L frontal and parietal lobes. PMH significant for PVC's, HTN, bilateral THR.    PT Comments    Pt progressing towards physical therapy goals. Was able to perform transfers and ambulation with min assist for balance support and safety without an AD. Cognition continues to limit safe mobility, and pt continues to demonstrate motor apraxia. Pt scored 6/24 on DGI, however cognition limiting ability to follow directions. Unable to follow multi-step commands this session - in order to complete a task therapist needed to break it down into single-step commands first. Continues to be appropriate for CIR level therapies at d/c.   Follow Up Recommendations  CIR;Supervision/Assistance - 24 hour     Equipment Recommendations  None recommended by PT(TBD by next venue of care)    Recommendations for Other Services Rehab consult     Precautions / Restrictions Precautions Precautions: Fall Restrictions Weight Bearing Restrictions: No    Mobility  Bed Mobility Overal bed mobility: Needs Assistance Bed Mobility: Supine to Sit     Supine to sit: Supervision     General bed mobility comments: inefficient, increased time, no physical assist, poor regard for IV line  Transfers Overall transfer level: Needs assistance Equipment used: 1 person hand held assist Transfers: Sit to/from Stand Sit to Stand: Min guard;Min assist         General transfer comment: Increased time to power-up to full stand. Pt reaching out for external support on bed due to LOB and required min assist to recover.    Ambulation/Gait Ambulation/Gait assistance: Min  assist Gait Distance (Feet): 200 Feet Assistive device: None Gait Pattern/deviations: Step-through pattern;Decreased stride length;Trunk flexed;Drifts right/left;Narrow base of support Gait velocity: Decreased Gait velocity interpretation: <1.8 ft/sec, indicate of risk for recurrent falls General Gait Details: Pt with short step/stride length and an almost shuffling gait pattern. Pt drifting L in hall and required occasional assist to avoid obstacles. Consistent assist to recover with lateral perturbations.    Stairs             Wheelchair Mobility    Modified Rankin (Stroke Patients Only) Modified Rankin (Stroke Patients Only) Pre-Morbid Rankin Score: No symptoms Modified Rankin: Moderately severe disability     Balance Overall balance assessment: Needs assistance Sitting-balance support: Feet supported;No upper extremity supported Sitting balance-Leahy Scale: Good     Standing balance support: No upper extremity supported;During functional activity Standing balance-Leahy Scale: Fair Standing balance comment: Statically.                            Cognition Arousal/Alertness: Awake/alert Behavior During Therapy: WFL for tasks assessed/performed Overall Cognitive Status: Impaired/Different from baseline Area of Impairment: Attention;Memory;Following commands;Safety/judgement;Awareness;Problem solving                   Current Attention Level: Selective Memory: Decreased short-term memory(repeating himself during session) Following Commands: Follows multi-step commands inconsistently Safety/Judgement: Decreased awareness of safety;Decreased awareness of deficits Awareness: Emergent Problem Solving: Slow processing;Decreased initiation;Difficulty sequencing;Requires verbal cues;Requires tactile cues General Comments: A/O x4. Requires increased time and multiple cues for pt to initiate a task. Motor apraxia.  Exercises      General Comments         Pertinent Vitals/Pain Pain Assessment: No/denies pain    Home Living                      Prior Function            PT Goals (current goals can now be found in the care plan section) Acute Rehab PT Goals Patient Stated Goal: Return to work PT Goal Formulation: With patient Time For Goal Achievement: 05/20/19 Potential to Achieve Goals: Good Progress towards PT goals: Progressing toward goals    Frequency    Min 4X/week      PT Plan Current plan remains appropriate    Co-evaluation              AM-PAC PT "6 Clicks" Mobility   Outcome Measure  Help needed turning from your back to your side while in a flat bed without using bedrails?: None Help needed moving from lying on your back to sitting on the side of a flat bed without using bedrails?: A Little Help needed moving to and from a bed to a chair (including a wheelchair)?: A Little Help needed standing up from a chair using your arms (e.g., wheelchair or bedside chair)?: A Little Help needed to walk in hospital room?: A Little Help needed climbing 3-5 steps with a railing? : A Little 6 Click Score: 19    End of Session Equipment Utilized During Treatment: Gait belt Activity Tolerance: Patient tolerated treatment well Patient left: in chair;with call bell/phone within reach;Other (comment)(OT present in room) Nurse Communication: Mobility status PT Visit Diagnosis: Other abnormalities of gait and mobility (R26.89);Other symptoms and signs involving the nervous system (R29.898);Apraxia (R48.2)     Time: 3016-0109 PT Time Calculation (min) (ACUTE ONLY): 23 min  Charges:  $Gait Training: 23-37 mins                     Rolinda Roan, PT, DPT Acute Rehabilitation Services Pager: [phone] Office: [phone]    Thelma Comp 05/07/2019, 10:16 AM

## 2019-05-07 NOTE — Sedation Documentation (Signed)
Vital signs stable. Pt able to follow commands from MD, tolerating procedure well

## 2019-05-07 NOTE — Sedation Documentation (Signed)
Patient is resting comfortably. 

## 2019-05-07 NOTE — Progress Notes (Signed)
SLP Cancellation Note  Patient Details Name: Ervey Fallin MRN: [id number] DOB: [birth year]   Cancelled treatment:       Reason Eval/Treat Not Completed: Patient at procedure or test/unavailable(Pt off unit at this time. SLP will re-attempt tx as able. )  Acire Tang I. Hardin Negus, [place], [place] Office number [phone] Pager [place] 05/07/2019, 2:40 PM

## 2019-05-07 NOTE — Procedures (Signed)
S/P  4 vessel cerebral arteriogram. RT CFA approach. Findings. 1.Occluded left ICA prox. 2.Severe preocclusive stenosis of right ICA prox. 3. Severe preocclusive  stenosis of bilateral vertebral arteries at the origins.

## 2019-05-07 NOTE — Progress Notes (Signed)
STROKE TEAM PROGRESS NOTE    .    SUBJECTIVE (INTERVAL HISTORY) Pt neurologically stable with no symptoms or complaints today.  Diagnostic cerebral catheter angiogram done by Dr.Deveshwarr shows occluded left proximal carotid artery and near occlusive stenosis of proximal right internal carotid arteries as well as bilateral vertebral artery origins.  OBJECTIVE Vitals:   05/07/19 1500 05/07/19 1515 05/07/19 1526 05/07/19 1549  BP: 126/78 131/75 131/79 125/73  Pulse: 78 78 75 74  Resp: 20 15 19 17   Temp:    98.1 F (36.7 C)  TempSrc:    Oral  SpO2: 100% 96%  98%  Weight:      Height:        CBC:  Recent Labs  Lab 05/05/19 0825  WBC 8.6  NEUTROABS 6.8  HGB 15.4  HCT 46.4  MCV 95.1  PLT 244    Basic Metabolic Panel:  Recent Labs  Lab 05/05/19 0825 05/06/19 1132 05/07/19 0635  NA 136 137 140  K 3.8 4.5 4.4  CL 103 104 105  CO2 26 23 27   GLUCOSE 103* 101* 98  BUN 34* 28* 25*  CREATININE 1.56* 1.44* 1.35*  CALCIUM 9.3 9.3 9.1  MG 2.2  --   --     Lipid Panel:     Component Value Date/Time   CHOL 167 05/06/2019 1602   TRIG 71 05/06/2019 1602   HDL 40 (L) 05/06/2019 1602   CHOLHDL 4.2 05/06/2019 1602   VLDL 14 05/06/2019 1602   LDLCALC 113 (H) 05/06/2019 1602   HgbA1c:  Lab Results  Component Value Date   HGBA1C 5.7 (H) 05/05/2019   Urine Drug Screen:     Component Value Date/Time   LABOPIA NONE DETECTED 05/05/2019 0825   COCAINSCRNUR NONE DETECTED 05/05/2019 0825   LABBENZ NONE DETECTED 05/05/2019 0825   AMPHETMU NONE DETECTED 05/05/2019 0825   THCU NONE DETECTED 05/05/2019 0825   LABBARB NONE DETECTED 05/05/2019 0825    Alcohol Level     Component Value Date/Time   ETH <10 05/05/2019 0825    IMAGING   Ct Angio Neck W Or Wo Contrast  Result Date: 05/06/2019 CLINICAL DATA:  Confusion. Speech disturbance. Old strokes. Follow-up acute strokes left hemisphere. EXAM: CT ANGIOGRAPHY NECK TECHNIQUE: Multidetector CT imaging of the neck was  performed using the standard protocol during bolus administration of intravenous contrast. Multiplanar CT image reconstructions and MIPs were obtained to evaluate the vascular anatomy. Carotid stenosis measurements (when applicable) are obtained utilizing NASCET criteria, using the distal internal carotid diameter as the denominator. CONTRAST:  22mL OMNIPAQUE IOHEXOL 350 MG/ML SOLN COMPARISON:  MRI study yesterday. FINDINGS: Aortic arch: Aortic atherosclerosis. No evidence of dissection. Maximal diameter of the ascending aorta 3.7 cm. Right carotid system: Common carotid artery is widely patent to the bifurcation. There is near occlusion of the ICA at the origin. There is a string sign through the region of the cervical internal carotid artery to the skull base. Left carotid system: Common carotid artery shows soft plaque with stenosis 40%. At the carotid bifurcation, there is extensive plaque with occlusion of the ICA. Vertebral arteries: Right subclavian artery is widely patent. There is atherosclerotic plaque at the right vertebral artery origin with stenosis estimated at 50%. Beyond that, the right vertebral artery is widely patent to the basilar. The left subclavian artery shows a 50% stenosis in its proximal 3 cm. There is atherosclerotic plaque at the left vertebral artery origin with stenosis of 50%. Beyond that, the left vertebral artery  is patent to the basilar. Skeleton: Ordinary mid cervical spondylosis. Other neck: No mass or lymphadenopathy. Upper chest: Negative IMPRESSION: Advanced atherosclerotic disease at the carotid bifurcation on the right with near occlusion the ICA. There is a patent string sign seen to the level of the skull base. Advanced atherosclerotic disease at the carotid bifurcation on the left with occlusion the ICA. No string sign identified on this side. 50% stenosis at both vertebral artery origins. Long segment 50% stenosis affecting the proximal left subclavian artery.  Electronically Signed   By: Nelson Chimes M.D.   On: 05/06/2019 16:18    Transthoracic Echocardiogram  Normal ejection fraction of 60 to 65%.  No wall motion abnormality  PHYSICAL EXAM Blood pressure 125/73, pulse 74, temperature 98.1 F (36.7 C), temperature source Oral, resp. rate 17, height 5\' 8"  (1.727 m), weight 76.1 kg, SpO2 98 %.  GEN-frail middle-aged Caucasian male no acute distress HEENT-  Normocephalic, normal conjunctiva Lungs- Respirations unlabored Extremities- No edema  Neurological Examination Mental Status: A&Ox4, conversation and memory intact. No anomia with objects, but there is word-finding difficulty during conversation w/hesitant speech at times Mild dysarthria. Can follow simple commands, but has difficulty with more complex commands such as cerebellar testing, which required showing him and assistance from me.  Cranial Nerves: II: Visual fields intact bilaterally, all 4 quadrants. PERRL.  III,IV, VI: Mild left ptosis. EOMI without nystagmus.  VII: No facial droop.  VIII: hearing intact to voice IX,X: No hoarseness or hypophonia XI: Head is midline XII: midline tongue extension Motor: Right :  Upper extremity   5/5                                      Left:     Upper extremity   5/5             Lower extremity   5/5                                                  Lower extremity   5/5 There is mildly increased extensor tone at left elbow and increased flexor tone at left wrist.  Subtle left pronator drift noted.   Diminished fine finger movements on the right.  T.  Orbits left  over right upper extremity.  Mild rightt grip weakness. Sensory: Temp intact x 4. Positive for extinction to RUE with DSS.  Deep Tendon Reflexes: wnl. Right toe downgoing. Left toe upgoing Cerebellar: No ataxia with FNF or HTS bilaterally, but has difficulty following commands.  Gait: Deferred  HOME MEDICATIONS:  Medications Prior to Admission  Medication Sig Dispense Refill  .  acetaminophen (TYLENOL) 325 MG tablet Take 650 mg by mouth every 6 (six) hours as needed for mild pain.     Marland Kitchen aspirin EC 81 MG tablet Take 81 mg by mouth daily.    . ferrous sulfate 325 (65 FE) MG tablet Take 325 mg by mouth 2 (two) times a week.    Marland Kitchen lisinopril-hydrochlorothiazide (PRINZIDE,ZESTORETIC) 10-12.5 MG tablet Take 1 tablet by mouth daily.    . Omega-3 Fatty Acids (FISH OIL PO) Take 1 capsule by mouth every evening.     . thiamine (VITAMIN B-1) 100 MG tablet Take 100 mg by mouth 2 (two) times a  week.        HOSPITAL MEDICATIONS:  . aspirin  325 mg Oral Daily  . atorvastatin  80 mg Oral q1800  . clopidogrel  75 mg Oral Daily  . fentaNYL      . heparin      . lidocaine      . midazolam        ALLERGIES Allergies  Allergen Reactions  . Penicillins Other (See Comments)    Childhood reaction. Has patient had a PCN reaction causing immediate rash, facial/tongue/throat swelling, SOB or lightheadedness with hypotension: unsure Has patient had a PCN reaction causing severe rash involving mucus membranes or skin necrosis: unsure Has patient had a PCN reaction that required hospitalization:No Has patient had a PCN reaction occurring within the last 10 years:No If all of the above answers are "NO", then may proceed with Cephalosporin use   ASSESSMENT/PLAN Mr. Jose Kaufman is a 68 y.o. male with history of HTN, HLD, dysrhythmia presenting with acute left fronto-parietal strokes. MRI also reveals no antegrade flow in either ICA, with chronic occlusions suspected. Watershed mechanism for the strokes is therefore suspected in the setting of dehydration and hypotension and working outdoors in the heat  Stroke  Resultant- expressive and receptive dysphasia,  CT head- nothing acute seen  MRI head -old strokes bilat frontal.paretal regions. New cortical infarcts in left frontal/parietal.   CTA H&N -Left ICA occlusion; Right String sign. IR consulted for Angiogram and possible  stenting in am  4 vessel diagnostic cerebral catheter angiogram : Left ICA occlusion.  Near occlusive stenosis of proximal right ICA and bilateral vertebral artery origins.  2D Echo -normal ejection fraction 60 to 65%.  No wall motion abnormality.  Sars Corona Virus 2 neg  LDL - pending  HgbA1c - 5.7  UDS - neg   VTE prophylaxis - Lovenox  Diet per SLP  81mg  ASA daily prior to admission, now on ASA daily + Plavix x 3weeks, further recs pending Angiogram.   Patient counseled to be compliant with his antithrombotic medications  Ongoing aggressive stroke risk factor management  Therapy recommendations: Pending  Disposition:  Pending  Hypertension  Stable . Permissive hypertension (OK if < 220/120) but gradually normalize in 5-7 days . Long-term BP goal normotensive  Hyperlipidemia  Lipid lowering medication PTA:  Omega 3  LDL pending, goal < 70  Current lipid lowering medication:Lipitor 80mg  daily  Continue statin at discharge  No known Diabetes  Other Stroke Risk Factors  Previous Cigarette smoker  Hx stroke/TIA  Hospital day # 2    Patient has significant extracranial large vessel stenosis/occlusion involving all 4 vessels and is a significant risk for a large stroke and will benefit with endovascular revascularization.  Discussed with Dr. Estanislado Pandy recommend elective right ICA angioplasty/stenting tomorrow.  Maintain systolic blood pressure greater than 120 and avoid hypotension and maintain adequate hydration.  Patient will need aggressive risk factor modification and dual antiplatelet therapy of aspirin and Plavix for 3 months.  He remains at risk for recurrent strokes.  Discussed with   Dr. Estanislado Pandy.  Greater than 50% time during this 25-minute visit was spent on counseling and coordination of care about his strokes and discussion about carotid stenosis and revascularization and stroke prevention.  Antony Contras, MD Medical Director [facility] Pager: [phone] 05/07/2019 5:48 PM  To contact Stroke Continuity provider, please refer to [email or web address] After hours, contact General Neurology

## 2019-05-07 NOTE — Consult Note (Signed)
Chief Complaint: Patient was seen in consultation today for bilateral ICA stenosis/diagnostic cerebral arteriogram.  Referring Physician(s): Garvin Fila  Supervising Physician: [name]  Patient Status: [facility] - [facility]pt  History of Present Illness: Jose Kaufman is a 68 y.o. male with a past medical history of hypertension, hyperlipidemia, PVCs, CVA 04/2019, and arthritis. He presented to [facility] ED 05/05/2019 with complaints of AMS/confusion, gait instability, and difficulty with word finding. In ED, CT head unremarkable and MR brain revealed areas of acute infarction in the left frontal and parietal brain. Neurology was consulted who recommended transfer to [facility] for admission and further management. CTA neck was ordered and revealed bilateral ICA stenosis (possible left ICA occlusion, right ICA string sign). Based on these findings, neurology recommends NIR consultation for management.  CT head 05/05/2019: 1. No acute finding by CT. Old bilateral frontal and parietal cortical and subcortical infarctions.  MR brain 05/05/2019: 1. Old cortical and subcortical infarctions in both frontal and parietal regions. Areas of acute infarction in the left frontal and parietal brain without significant swelling or any acute hemorrhage. No antegrade flow is seen in either internal carotid artery and chronic occlusion is suspected. Posterior circulation does show patency. These acute infarctions could be embolic or watershed in nature.  CTA neck 05/06/2019: 1. Advanced atherosclerotic disease at the carotid bifurcation on the right with near occlusion the ICA. There is a patent string sign seen to the level of the skull base. 2. Advanced atherosclerotic disease at the carotid bifurcation on the left with occlusion the ICA. No string sign identified on this side. 3. 50% stenosis at both vertebral artery origins. Long segment 50% stenosis affecting the proximal left subclavian artery.  IR consulted by  Dr. Leonie Man for management of bilateral ICA stenosis (possible left ICA occlusion, right ICA string sign). Patient awake and alert sitting in chair. Complains of slurred speech, stable since admission. Denies fever, chills, chest pain, dyspnea, abdominal pain, or headache.  Patient is currently taking Plavix 75 mg once daily, Aspirin 325 mg once daily, and receiving Lovenox 40 mg SQ injections once daily.   Past Medical History:  Diagnosis Date   Abnormality of gait 10/15/2016   Arthritis    Dysrhythmia    hx of   Essential hypertension 03/14/2017   PVCs (premature ventricular contractions) 03/14/2017    Past Surgical History:  Procedure Laterality Date   TOTAL HIP ARTHROPLASTY Right 03/22/2017   Procedure: RIGHT TOTAL HIP ARTHROPLASTY ANTERIOR APPROACH;  Surgeon: Mcarthur Rossetti, MD;  Location: [facility] ORS;  Service: Orthopedics;  Laterality: Right;   TOTAL HIP ARTHROPLASTY     Left 08/23/17 Dr. Zollie Beckers   TOTAL HIP ARTHROPLASTY Left 08/23/2017   Procedure: LEFT TOTAL HIP ARTHROPLASTY ANTERIOR APPROACH;  Surgeon: Mcarthur Rossetti, MD;  Location: [facility] ORS;  Service: Orthopedics;  Laterality: Left;    Allergies: Penicillins  Medications: Prior to Admission medications   Medication Sig Start Date End Date Taking? Authorizing Provider  acetaminophen (TYLENOL) 325 MG tablet Take 650 mg by mouth every 6 (six) hours as needed for mild pain.    Yes [provider]  aspirin EC 81 MG tablet Take 81 mg by mouth daily.   Yes [provider]  ferrous sulfate 325 (65 FE) MG tablet Take 325 mg by mouth 2 (two) times a week.   Yes [provider]  lisinopril-hydrochlorothiazide (PRINZIDE,ZESTORETIC) 10-12.5 MG tablet Take 1 tablet by mouth daily.   Yes [provider]  Omega-3 Fatty Acids ([place]  OIL PO) Take 1 capsule by mouth every evening.    Yes [provider]  thiamine (VITAMIN B-1) 100 MG tablet Take 100 mg by mouth 2 (two) times a  week.   Yes [provider]     Family History  Problem Relation Age of Onset   Leukemia Mother    Pneumonia Father     Social History   Socioeconomic History   Marital status: Divorced    Spouse name: Not on file   Number of children: 2   Years of education: 12   Highest education level: Not on file  Occupational History   Occupation: [employer]   Financial resource strain: Not on file   Food insecurity:    Worry: Not on file    Inability: Not on file   Transportation needs:    Medical: Not on file    Non-medical: Not on file  Tobacco Use   Smoking status: Former Smoker    Types: Cigarettes    Last attempt to quit: 05/20/1992    Years since quitting: 26.9   Smokeless tobacco: Never Used  Substance and Sexual Activity   Alcohol use: Yes    Comment: Occasional   Drug use: No   Sexual activity: Yes  Lifestyle   Physical activity:    Days per week: Not on file    Minutes per session: Not on file   Stress: Not on file  Relationships   Social connections:    Talks on phone: Not on file    Gets together: Not on file    Attends religious service: Not on file    Active member of club or organization: Not on file    Attends meetings of clubs or organizations: Not on file    Relationship status: Not on file  Other Topics Concern   Not on file  Social History Narrative   Pt lives at home, divorced   Right-handed   Caffeine: soda daily     Review of Systems: A 12 point ROS discussed and pertinent positives are indicated in the HPI above.  All other systems are negative.  Review of Systems  Constitutional: Negative for chills and fever.  Respiratory: Negative for shortness of breath and wheezing.   Cardiovascular: Negative for chest pain and palpitations.  Gastrointestinal: Negative for abdominal pain.  Neurological: Positive for speech difficulty. Negative for headaches.  Psychiatric/Behavioral: Negative for behavioral  problems and confusion.    Vital Signs: BP 116/80 (BP Location: Left Arm)    Pulse 78    Temp 97.9 F (36.6 C) (Oral)    Resp 14    Ht 5\' 8"  (1.727 m)    Wt 167 lb 12.3 oz (76.1 kg)    SpO2 100%    BMI 25.51 kg/m   Physical Exam Vitals signs and nursing note reviewed.  Constitutional:      General: He is not in acute distress.    Appearance: Normal appearance.  Cardiovascular:     Rate and Rhythm: Normal rate and regular rhythm.     Heart sounds: Normal heart sounds. No murmur.  Pulmonary:     Effort: Pulmonary effort is normal. No respiratory distress.     Breath sounds: Normal breath sounds. No wheezing.  Skin:    General: Skin is warm and dry.  Neurological:     Mental Status: He is alert and oriented to person, place, and time.  Psychiatric:        Mood and  Affect: Mood normal.        Behavior: Behavior normal.        Thought Content: Thought content normal.        Judgment: Judgment normal.      MD Evaluation Airway: WNL Heart: WNL Abdomen: WNL Chest/ Lungs: WNL ASA  Classification: 3 Mallampati/Airway Score: Two   Imaging: Ct Head Wo Contrast  Result Date: 05/05/2019 CLINICAL DATA:  Confusion and speech disturbance beginning 2 days ago. EXAM: CT HEAD WITHOUT CONTRAST TECHNIQUE: Contiguous axial images were obtained from the base of the skull through the vertex without intravenous contrast. COMPARISON:  None. FINDINGS: Brain: No acute finding by CT. The brainstem and cerebellum appear normal. There are old appearing cortical and subcortical infarctions in the frontal and parietal regions of both hemispheres. None of these are discernibly recent. No mass lesion, hemorrhage, hydrocephalus or extra-axial collection. Vascular: No abnormal vascular finding. Skull: Negative Sinuses/Orbits: Scattered retention cysts. No sign of acute sinusitis. Orbits negative. Other: None IMPRESSION: No acute finding by CT. Old bilateral frontal and parietal cortical and subcortical  infarctions. Electronically Signed   By: Nelson Chimes M.D.   On: 05/05/2019 09:48   Ct Angio Neck W Or Wo Contrast  Result Date: 05/06/2019 CLINICAL DATA:  Confusion. Speech disturbance. Old strokes. Follow-up acute strokes left hemisphere. EXAM: CT ANGIOGRAPHY NECK TECHNIQUE: Multidetector CT imaging of the neck was performed using the standard protocol during bolus administration of intravenous contrast. Multiplanar CT image reconstructions and MIPs were obtained to evaluate the vascular anatomy. Carotid stenosis measurements (when applicable) are obtained utilizing NASCET criteria, using the distal internal carotid diameter as the denominator. CONTRAST:  89mL OMNIPAQUE IOHEXOL 350 MG/ML SOLN COMPARISON:  MRI study yesterday. FINDINGS: Aortic arch: Aortic atherosclerosis. No evidence of dissection. Maximal diameter of the ascending aorta 3.7 cm. Right carotid system: Common carotid artery is widely patent to the bifurcation. There is near occlusion of the ICA at the origin. There is a string sign through the region of the cervical internal carotid artery to the skull base. Left carotid system: Common carotid artery shows soft plaque with stenosis 40%. At the carotid bifurcation, there is extensive plaque with occlusion of the ICA. Vertebral arteries: Right subclavian artery is widely patent. There is atherosclerotic plaque at the right vertebral artery origin with stenosis estimated at 50%. Beyond that, the right vertebral artery is widely patent to the basilar. The left subclavian artery shows a 50% stenosis in its proximal 3 cm. There is atherosclerotic plaque at the left vertebral artery origin with stenosis of 50%. Beyond that, the left vertebral artery is patent to the basilar. Skeleton: Ordinary mid cervical spondylosis. Other neck: No mass or lymphadenopathy. Upper chest: Negative IMPRESSION: Advanced atherosclerotic disease at the carotid bifurcation on the right with near occlusion the ICA. There is a  patent string sign seen to the level of the skull base. Advanced atherosclerotic disease at the carotid bifurcation on the left with occlusion the ICA. No string sign identified on this side. 50% stenosis at both vertebral artery origins. Long segment 50% stenosis affecting the proximal left subclavian artery. Electronically Signed   By: Nelson Chimes M.D.   On: 05/06/2019 16:18   Mr Brain Wo Contrast  Result Date: 05/05/2019 CLINICAL DATA:  Confusion. Speech disturbance. Old strokes. Assess for acute finding. Abnormal head CT. EXAM: MRI HEAD WITHOUT CONTRAST TECHNIQUE: Multiplanar, multiecho pulse sequences of the brain and surrounding structures were obtained without intravenous contrast. COMPARISON:  Head CT same day FINDINGS: Brain:  There is a background pattern of old cortical and subcortical infarctions and both frontal and parietal regions. There are scattered areas of acute infarction affecting the left frontal and parietal cortical and subcortical brain consistent with embolic and or watershed infarctions on the left. No evidence of mass effect or hemorrhage. No acute infarctions seen on the right or in the posterior circulation brain. No hydrocephalus. No extra-axial collection. Vascular: There appears to be abnormal flow in both internal carotid arteries at the skull base level. These could be chronically occluded. Vertebrobasilar system does show flow. Skull and upper cervical spine: Negative Sinuses/Orbits: Retention cyst right maxillary sinus. Orbits negative. Other: None IMPRESSION: Old cortical and subcortical infarctions in both frontal and parietal regions. Areas of acute infarction in the left frontal and parietal brain without significant swelling or any acute hemorrhage. No antegrade flow is seen in either internal carotid artery and chronic occlusion is suspected. Posterior circulation does show patency. These acute infarctions could be embolic or watershed in nature. Electronically Signed    By: Nelson Chimes M.D.   On: 05/05/2019 12:00   Dg Chest Portable 1 View  Result Date: 05/05/2019 CLINICAL DATA:  Mental status changes EXAM: PORTABLE CHEST 1 VIEW COMPARISON:  05/03/2016 FINDINGS: Bibasilar opacities, likely atelectasis. Heart is normal size. No effusions. No acute bony abnormality. IMPRESSION: Bibasilar airspace opacities, atelectasis versus early infiltrates. Electronically Signed   By: Rolm Baptise M.D.   On: 05/05/2019 08:51    Labs:  CBC: Recent Labs    05/05/19 0825  WBC 8.6  HGB 15.4  HCT 46.4  PLT 216    COAGS: Recent Labs    05/05/19 0825  INR 1.1  APTT 34    BMP: Recent Labs    05/05/19 0825 05/06/19 1132 05/07/19 0635  NA 136 137 140  K 3.8 4.5 4.4  CL 103 104 105  CO2 26 23 27   GLUCOSE 103* 101* 98  BUN 34* 28* 25*  CALCIUM 9.3 9.3 9.1  CREATININE 1.56* 1.44* 1.35*  GFRNONAA 46* 51* 55*  GFRAA 53* 59* >60    LIVER FUNCTION TESTS: Recent Labs    05/05/19 0825  BILITOT 0.8  AST 19  ALT 20  ALKPHOS 74  PROT 8.0  ALBUMIN 4.7     Assessment and Plan:  Bilateral ICA stenosis (possible left ICA occlusion, right ICA string sign). Plan for image-guided diagnostic cerebral arteriogram tentatively for today with Dr. Estanislado Pandy. Patient is NPO. Afebrile and WBCs WNL. COVID negative. Ok to proceed with Plavix/Aspirin/Lovenox use per Dr. Estanislado Pandy. INR 1.1 05/05/2019.  Risks and benefits of cerebral arteriogram were discussed with the patient including, but not limited to bleeding, infection, vascular injury or contrast induced renal failure. This interventional procedure involves the use of X-rays and because of the nature of the planned procedure, it is possible that we will have prolonged use of X-ray fluoroscopy. Potential radiation risks to you include (but are not limited to) the following: - A slightly elevated risk for cancer  several years later in life. This risk is typically less than 0.5% percent. This risk is low in  comparison to the normal incidence of human cancer, which is 33% for women and 50% for men according to the [place]. - Radiation induced injury can include skin redness, resembling a rash, tissue breakdown / ulcers and hair loss (which can be temporary or permanent).  The likelihood of either of these occurring depends on the difficulty of the procedure and whether you are  sensitive to radiation due to previous procedures, disease, or genetic conditions.  IF your procedure requires a prolonged use of radiation, you will be notified and given written instructions for further action.  It is your responsibility to monitor the irradiated area for the 2 weeks following the procedure and to notify your physician if you are concerned that you have suffered a radiation induced injury.   All of the patient's questions were answered, patient is agreeable to proceed. Consent signed and in chart.   Thank you for this interesting consult.  I greatly enjoyed meeting [facility] and look forward to participating in their care.  A copy of this report was sent to the requesting provider on this date.  Electronically Signed: Earley Abide, PA-C 05/07/2019, 9:37 AM   I spent a total of 40 Minutes in face to face in clinical consultation, greater than 50% of which was counseling/coordinating care for bilateral ICA stenosis/diagnostic cerebral arteriogram.

## 2019-05-07 NOTE — Sedation Documentation (Signed)
Patient is resting comfortably. No complaints from pt at this time.  

## 2019-05-07 NOTE — Progress Notes (Signed)
NIR.  Patient underwent an image-guided diagnostic cerebral arteriogram today with Dr. Estanislado Pandy, findings: 1. Occluded left ICA prox. 2. Severe preocclusive stenosis of right ICA prox. 3. Severe preocclusive  stenosis of bilateral vertebral arteries at the origins.  At this time, it is recommended that patient undergo endovascular intervention. Plan for image-guided cerebral arteriogram with possible revascularization of right ICA stenosis versus right VA stenosis tomorrow with Dr. Estanislado Pandy. Patient will be NPO at midnight. Hold Lovenox but ok to proceed with Plavix/Aspirin use. Need repeat COVID testing as patient will be under general anesthesia (protocol is testing within 72 hours of this). Will consent patient tomorrow prior to procedure as patient underwent sedation this PM. Jose Conn, RN aware of above.  Please call NIR with questions/concerns. NIR to follow.   Jose Graff Shaterrica Territo, PA-C 05/07/2019, 4:18 PM

## 2019-05-07 NOTE — Sedation Documentation (Signed)
Vital signs stable. 

## 2019-05-07 NOTE — TOC Initial Note (Signed)
Transition of Care [facility]) - Initial/Assessment Note    Patient Details  Name: Jose Kaufman MRN: [id number] Date of Birth: 1951/09/08  [facility] CM/SW Contact:    Pollie Friar, RN Phone Number: 05/07/2019, 1:32 PM  Clinical Narrative:                 Awaiting IR.  Pt without home medications issues and denies issues with transportation.  Expected Discharge Plan: IP Rehab Facility Barriers to Discharge: Continued Medical Work up   Patient Goals and CMS Choice        Expected Discharge Plan and Services Expected Discharge Plan: [place]       Living arrangements for the past 2 months: Single Family Home(one level) Expected Discharge Date: (unknown)                                    Prior Living Arrangements/Services Living arrangements for the past 2 months: Single Family Home(one level) Lives with:: Self Patient language and need for interpreter reviewed:: Yes(no needs) Do you feel safe going back to the place where you live?: Yes      Need for Family Participation in Patient Care: Yes (Comment) Care giver support system in place?: Yes (comment)(Pt states he can stay with his brother that can provide 24/7) Current home services: DME(walker, 3 in 1, shower seat) Criminal Activity/Legal Involvement Pertinent to Current Situation/Hospitalization: No - Comment as needed  Activities of Daily Living Home Assistive Devices/Equipment: Bedside commode/3-in-1, Cane (specify quad or straight), Contact lenses, Shower chair without back, Walker (specify type), Eyeglasses(singlepoint cane, front wheeled walker) ADL Screening (condition at time of admission) Patient's cognitive ability adequate to safely complete daily activities?: Yes Is the patient deaf or have difficulty hearing?: No Does the patient have difficulty seeing, even when wearing glasses/contacts?: No Does the patient have difficulty concentrating, remembering, or making decisions?:  Yes(having trouble getting words out) Patient able to express need for assistance with ADLs?: Yes Does the patient have difficulty dressing or bathing?: No Independently performs ADLs?: Yes (appropriate for developmental age) Does the patient have difficulty walking or climbing stairs?: No Weakness of Legs: None Weakness of Arms/Hands: None  Permission Sought/Granted                  Emotional Assessment Appearance:: Appears stated age Attitude/Demeanor/Rapport: Engaged Affect (typically observed): Accepting, Appropriate, Pleasant Orientation: : Oriented to Self, Oriented to Place, Oriented to  Time, Oriented to Situation   Psych Involvement: No (comment)  Admission diagnosis:  Internal carotid artery occlusion, bilateral [I65.23] Cerebrovascular accident (CVA), unspecified mechanism (IXL) [I63.9] Covid-19 Virus not Detected [IMO0001] Patient Active Problem List   Diagnosis Date Noted  . Hyperlipidemia 05/06/2019  . CVA (cerebral vascular accident) ([place]) 05/05/2019  . Internal carotid artery occlusion, bilateral   . Covid-19 Virus not Detected   . Status post total replacement of left hip 08/23/2017  . Unilateral primary osteoarthritis, left hip 05/02/2017  . Status post total replacement of right hip 03/22/2017  . PVCs (premature ventricular contractions) 03/14/2017  . Essential hypertension 03/14/2017  . Unilateral primary osteoarthritis, right hip 03/08/2017  . Bilateral hip pain 10/15/2016  . Abnormality of gait 10/15/2016   PCP:  Michel Harrow PA-C Pharmacy:   [facility], [address] [place]. [place] [address] Phone: [phone] Fax: [phone]     Social Determinants  of Health (SDOH) Interventions    Readmission Risk Interventions No flowsheet data found.  

## 2019-05-07 NOTE — Sedation Documentation (Signed)
Patient is resting comfortably. Procedure started °

## 2019-05-07 NOTE — PMR Pre-admission (Signed)
PMR Admission Coordinator Pre-Admission Assessment  Patient: Jose Kaufman is an 68 y.o., male MRN: [id number] DOB: 03-23-1951 Height: 5' 7.99" (172.7 cm) Weight: 76.1 kg  Insurance Information HMO:     PPO: yes     PCP:      IPA:      80/20:      OTHER:  PRIMARY: UMR      Policy#: [id number]      Subscriber: Patient CM Name: [name]      Phone#: [phone]     Fax#: [phone] Pre-Cert#: [id number]      Employer:  Josem Kaufmann provided by [name] at [facility] for admit to CIR. Pt is approved for 7 days with start date 6/4. First review date is 6/11. Clinical updates are due to [place] at [facility]): [phone] (f): [phone] Benefits:  Phone #: [phone]     Name: online at [email or web address] Eff. Date: 12/10/18-12/10/2019     Deduct: $1,250 (met $209.29)      Out of Pocket Max: $7,150 (includes deductible, $209.29 met)      Life Max: NA CIR: 80% coverage, 20% co-insurance       SNF: 80% coverage, 20% co-insurance; limited to 60 day/cal yr Outpatient: 70% coverage,  limited by medical necessity     Co-Pay: 30% co-insurance with a max per visit co-insurance of $75/visit (deductible waived); Home Health: 80% coverage, limited to 100 visits per cal year      Co-Pay: 20% co-insurance DME: 80% coverage     Co-Pay: 20% co-insurance Providers:  SECONDARY: Medicare Part A      Policy#: [id number]      Subscriber: Patient CM Name:       Phone#:      Fax#:  Pre-Cert#:       Employer:  Benefits:  Phone #:      Name: verified eligibility online via [employer] on 05/13/19 Eff. Date: part A: 08/10/2018     Deduct:       Out of Pocket Max:       Life Max:  CIR:       SNF:  Outpatient:      Co-Pay:  Home Health:       Co-Pay: DME:      Co-Pay:   Medicaid Application Date:       Case Manager:  Disability Application Date:       Case Worker:   The "Data Collection Information Summary" for patients in Inpatient Rehabilitation Facilities with attached "Privacy Act [place] Records" was provided and verbally reviewed  with: Patient  Emergency Contact Information Contact Information    Name Relation Home Work Mobile   [name],Jose Kaufman [phone]        Current Medical History  Patient Admitting Diagnosis: Acute cortical infarcts in left frontal/parietal.   History of Present Illness: Jose Kaufman is a 68 year old male with history of hypertension as well as hyperlipidemia, PVCs maintained on aspirin. Pt presented 05/05/2019 with altered mental status and word finding difficulties.  Cranial CT scan negative for acute findings noted old bilateral frontal and parietal cortical and subcortical infarctions.  MRI shows old cortical and subcortical infarcts in both frontal and parietal regions.  Areas of acute infarct in the left frontal and parietal brain without significant swelling or acute hemorrhage.  CT angiogram of head and neck advanced atherosclerotic disease at the carotid bifurcation on the right with near occlusion of the ICA.  There was a patent string sign seen to the level of the skull base.  50%  stenosis of both vertebral artery origins.  Cerebral angiogram showed occluded left ICA proximal.  Severe preocclusive stenosis of right ICA proximal.  Severe preocclusive stenosis of bilateral vertebral arteries of the origins.  Patient underwent right VA stenosis status post stent assisted angioplasty 05/12/2019 per interventional radiology.  Neurology follow-up currently maintained on aspirin as well as Brilinta.  Tolerating a regular consistency diet.  Therapy evaluations completed and patient is to be admitted for a comprehensive rehab program on 05/14/19.  Complete NIHSS TOTAL: 1  Patient's medical record from [facility] has been reviewed by the rehabilitation admission coordinator and physician.  Past Medical History  Past Medical History:  Diagnosis Date  . Abnormality of gait 10/15/2016  . Arthritis   . Dysrhythmia    hx of  . Essential hypertension 03/14/2017  . PVCs  (premature ventricular contractions) 03/14/2017    Family History   family history includes Leukemia in his mother; Pneumonia in his father.  Prior Rehab/Hospitalizations Has the patient had prior rehab or hospitalizations prior to admission? No  Has the patient had major surgery during 100 days prior to admission? Yes   Current Medications  Current Facility-Administered Medications:  .  acetaminophen (TYLENOL) tablet 650 mg, 650 mg, Oral, Q4H PRN **OR** acetaminophen (TYLENOL) solution 650 mg, 650 mg, Per Tube, Q4H PRN **OR** acetaminophen (TYLENOL) suppository 650 mg, 650 mg, Rectal, Q4H PRN, [name], [name], MD .  aspirin chewable tablet 81 mg, 81 mg, Oral, Daily, 81 mg at 05/14/19 0903 **OR** aspirin chewable tablet 81 mg, 81 mg, Per Tube, Daily, [name], [name], MD .  atorvastatin (LIPITOR) tablet 80 mg, 80 mg, Oral, q1800, [name], MD, 80 mg at 05/13/19 1839 .  Chlorhexidine Gluconate Cloth 2 % PADS 6 each, 6 each, Topical, Daily, [name], MD, 6 each at 05/13/19 1312 .  ticagrelor (BRILINTA) tablet 90 mg, 90 mg, Oral, BID, 90 mg at 05/14/19 0903 **OR** ticagrelor (BRILINTA) tablet 90 mg, 90 mg, Per Tube, BID, [name], [name], MD  Patients Current Diet:  Diet Order            Diet - low sodium heart healthy        Diet Heart Room service appropriate? Yes; Fluid consistency: Thin  Diet effective now              Precautions / Restrictions Precautions Precautions: Fall Restrictions Weight Bearing Restrictions: No   Has the patient had 2 or more falls or a fall with injury in the past year? No  Prior Activity Level Community (5-7x/wk): active and independent PTA; working full time with [employer] Drove PTA. no AD use PTA.   Prior Functional Level Self Care: Did the patient need help bathing, dressing, using the toilet or eating? Independent  Indoor Mobility: Did the patient need assistance with walking from room to room (with or  without device)? Independent  Stairs: Did the patient need assistance with internal or external stairs (with or without device)? Independent  Functional Cognition: Did the patient need help planning regular tasks such as shopping or remembering to take medications? [place] / Equipment Home Assistive Devices/Equipment: Bedside commode/3-in-1, Cane (specify quad or straight), Contact lenses, Shower chair without back, Walker (specify type), Eyeglasses(singlepoint cane, front wheeled walker) Home Equipment: Walker - 2 wheels, Bedside commode, Shower seat  Prior Device Use: Indicate devices/aids used by the patient prior to current illness, exacerbation or injury? None of the above  Current Functional Level Cognition  Arousal/Alertness: Awake/alert  Overall Cognitive Status: Impaired/Different from baseline Current Attention Level: Selective Orientation Level: Oriented X4 Following Commands: Follows multi-step commands with increased time Safety/Judgement: Decreased awareness of deficits General Comments: pt noted to have increased difficulty sequencing through grooming tasks, requires increased time to perform, min cues for safety; pt also with decreased insight into current deficits, verbalizing his only deficits were speech related Attention: Focused, Sustained Focused Attention: Appears intact Sustained Attention: Appears intact    Extremity Assessment (includes Sensation/Coordination)  Upper Extremity Assessment: RUE deficits/detail, LUE deficits/detail RUE Deficits / Details: pt continues to have impaired coordination to RUE (RUE>LUE) RUE Coordination: decreased fine motor, decreased gross motor LUE Deficits / Details: required assistance to open Pillbox assessment medication bottles LUE Coordination: decreased fine motor, decreased gross motor  Lower Extremity Assessment: Defer to PT evaluation RLE Deficits / Details: Bilaterally, strength 4+/5 and pt  does not endorse sensation changes. Decreased coordination noted with functional tasks.  RLE Sensation: WNL RLE Coordination: decreased fine motor, decreased gross motor    ADLs  Overall ADL's : Needs assistance/impaired Eating/Feeding: Set up, Sitting Grooming: Wash/dry face, Oral care, Brushing hair, Min guard, Standing Grooming Details (indicate cue type and reason): Pt requires increased time to sequence through tasks, but is able to do so without cues given added time; minguard for standing balance Upper Body Bathing: Min guard, Standing Upper Body Bathing Details (indicate cue type and reason): min guard for balance, min-mod cueing to sequence bathing,drying and applying lotion; noted decreased coordiantion with RU E  Lower Body Bathing: Minimal assistance, Sit to/from stand Upper Body Dressing : Minimal assistance, Sitting Lower Body Dressing: Min guard Lower Body Dressing Details (indicate cue type and reason): pt donned socks while sitting EOB;minguard for stability  Toilet Transfer: Min guard, Ambulation, RW Toilet Transfer Details (indicate cue type and reason): simulated in room Toileting- Clothing Manipulation and Hygiene: Supervision/safety(standing) Functional mobility during ADLs: Min guard, Rolling walker General ADL Comments: pt requires cues to take larger steps with walker; intermittent cues for following through with mobility tasks, initially upon entering bathroom pt started walking towards toilet, but then self corrected to turn towards sink for grooming ADL (pt already reporting he did not need to use bathroom prior to entry)     Mobility  Overal bed mobility: Needs Assistance Bed Mobility: Sit to Supine Supine to sit: Supervision Sit to supine: Supervision General bed mobility comments: Pt received OOB in recliner    Transfers  Overall transfer level: Needs assistance Equipment used: Rolling walker (2 wheeled) Transfers: Sit to/from Stand Sit to Stand: Min  guard General transfer comment: min guard for initial balance    Ambulation / Gait / Stairs / Wheelchair Mobility  Ambulation/Gait Ambulation/Gait assistance: Herbalist (Feet): 200 Feet Assistive device: None Gait Pattern/deviations: Step-through pattern, Decreased stride length, Narrow base of support, Shuffle General Gait Details: Pt with shuffling gait pattern, decreased reciprocal arm swing. Lateral LOB with challenge requiring min assist to correct.  Gait velocity: 1.5 ft/s Gait velocity interpretation: <1.8 ft/sec, indicate of risk for recurrent falls    Posture / Balance Dynamic Sitting Balance Sitting balance - Comments: no LOB with donning socks Balance Overall balance assessment: Needs assistance Sitting-balance support: Feet supported, No upper extremity supported Sitting balance-Leahy Scale: Good Sitting balance - Comments: no LOB with donning socks Standing balance support: No upper extremity supported, During functional activity Standing balance-Leahy Scale: Fair Standing balance comment: min guard for safety and balance Standardized Balance Assessment Standardized Balance Assessment : Dynamic Gait Index  Dynamic Gait Index Level Surface: Moderate Impairment Change in Gait Speed: Moderate Impairment Gait with Horizontal Head Turns: Moderate Impairment Gait with Vertical Head Turns: Moderate Impairment Gait and Pivot Turn: Moderate Impairment Step Over Obstacle: Severe Impairment Step Around Obstacles: Severe Impairment Steps: Moderate Impairment Total Score: 6    Special needs/care consideration BiPAP/CPAP: no CPM : no Continuous Drip IV : no Dialysis : no        Days : no Life Vest : no Oxygen : no Special Bed : no Trach Size : no Wound Vac (area) : no      Location : no Skin: incision to right groin                       Bowel mgmt: continent, last BM 05/13/19 Bladder mgmt: external catheter Diabetic mgmt: no Behavioral consideration :  no Chemo/radiation : no   Previous Home Environment (from acute therapy documentation) Living Arrangements: Alone  Lives With: Alone Available Help at Discharge: Family, Available PRN/intermittently Type of Home: House Home Layout: One level Home Access: Stairs to enter Entrance Stairs-Rails: Right, Left, Can reach both Entrance Stairs-Number of Steps: 3 Bathroom Shower/Tub: Multimedia programmer: Standard Home Care Services: No  Discharge Living Setting Plans for Discharge Living Setting: House Type of Home at Discharge: House Discharge Home Layout: One level Discharge Home Access: Stairs to enter Entrance Stairs-Rails: Can reach both Entrance Stairs-Number of Steps: 2 Discharge Bathroom Shower/Tub: Walk-in shower Discharge Bathroom Toilet: Standard Discharge Bathroom Accessibility: Yes How Accessible: Accessible via walker Does the patient have any problems obtaining your medications?: No  Social/Family/Support Systems Patient Roles: Other (Comment)(works full time; has brother that lives nearby) Contact Information: brother Terrin Meddaugh): [phone] Anticipated Caregiver: brother Anticipated Caregiver's Contact Information: see above Ability/Limitations of Caregiver: Min A/Supervision Caregiver Availability: Intermittent (check in physically at least 1x/day) Discharge Plan Discussed with Primary Caregiver: Yes, brother Is Caregiver In Agreement with Plan?: Yes Does Caregiver/Family have Issues with Lodging/Transportation while Pt is in Rehab?: No  Goals/Additional Needs Patient/Family Goal for Rehab: PT/OT/SLP: Mod I/ Intermittant Supervision  Expected length of stay: 5-7 days Cultural Considerations: NA Dietary Needs: heart healthy, thin liquids Equipment Needs: TBD Pt/Family Agrees to Admission and willing to participate: Yes Program Orientation Provided & Reviewed with Pt/Caregiver Including Roles  & Responsibilities: Yes(pt and brother).    Barriers to Discharge: Home environment access/layout, Lack of/limited family support  Barriers to Discharge Comments: lives alone, brother can only be there intermittantly; steps to enter  Decrease burden of Care through IP rehab admission: NA  Possible need for SNF placement upon discharge: Not anticipated; pt has good prognosis for further progress through CIR. Anticipate pt will progress to Mod I in short period of time. Brother has confirmed he will be able to check in on him once discharged home form CIR.   Patient Condition: I have reviewed medical records from [facility], spoken with RN and MD, and patient and family member (brother). I met with patient at the bedside for inpatient rehabilitation assessment.  Patient will benefit from ongoing PT, OT and SLP, can actively participate in 3 hours of therapy a day 5 days of the week, and can make measurable gains during the admission.  Patient will also benefit from the coordinated team approach during an Inpatient Acute Rehabilitation admission.  The patient will receive intensive therapy as well as Rehabilitation physician, nursing, social worker, and care management interventions.  Due to bladder management,  bowel management, safety, skin/wound care, disease management, medication administration, pain management and patient education the patient requires 24 hour a day rehabilitation nursing.  The patient is currently Min A with mobility and Min G basic ADLs.  Discharge setting and therapy post discharge at home with home health is anticipated.  Patient has agreed to participate in the Acute Inpatient Rehabilitation Program and will admit today.  Preadmission Screen Completed By:  Jhonnie Garner, 05/14/2019 2:08 PM ______________________________________________________________________   Discussed status with Dr. Letta Pate on 05/14/19 at 2:06pm and received approval for admission today.  Admission Coordinator:  Jhonnie Garner, OT, time  2:06PM/Date 05/14/19   Assessment/Plan: Diagnosis: Left frontal infarct with right hemiparesis and aphasia causing reduced mobility self-care and communication skills 1. Does the need for close, 24 hr/day Medical supervision in concert with the patient's rehab needs make it unreasonable for this patient to be served in a less intensive setting? Yes 2. Co-Morbidities requiring supervision/potential complications: Essential hypertension, cerebrovascular disease status post vertebral artery stenting postoperative day #2 3. Due to bladder management, bowel management, safety, skin/wound care, disease management, medication administration, pain management and patient education, does the patient require 24 hr/day rehab nursing? Yes 4. Does the patient require coordinated care of a physician, rehab nurse, PT (1-2 hrs/day, 5 days/week), OT (1-2 hrs/day, 5 days/week) and SLP (.5-1 hrs/day, 5 days/week) to address physical and functional deficits in the context of the above medical diagnosis(es)? Yes Addressing deficits in the following areas: balance, endurance, locomotion, strength, transferring, bowel/bladder control, bathing, dressing, feeding, grooming, toileting and psychosocial support 5. Can the patient actively participate in an intensive therapy program of at least 3 hrs of therapy 5 days a week? Yes 6. The potential for patient to make measurable gains while on inpatient rehab is excellent 7. Anticipated functional outcomes upon discharge from inpatients are: modified independent PT, modified independent OT, modified independent SLP 8. Estimated rehab length of stay to reach the above functional goals is: 7d 9. Anticipated D/C setting: Home 10. Anticipated post D/C treatments: [place] therapy 11. Overall Rehab/Functional Prognosis: excellent  MD Signature: "I have personally performed a face to face diagnostic evaluation of this patient.  Additionally, I have reviewed and concur with the physician  assistant's documentation above." [name] M.D. [facility] FAAPM&R (Sports Med, Neuromuscular Med) Diplomate Am Board of Electrodiagnostic Med

## 2019-05-07 NOTE — Progress Notes (Signed)
Occupational Therapy Treatment Patient Details Name: Jose Kaufman MRN: [id number] DOB: 03-05-1951 Today's Date: 05/07/2019    History of present illness Pt is a 68 y/o male who presented from [facility] ED with aphasia and confusion. MRI revealed old cortical and subcortical infarcts in both frontal and parietal regions, as well as areas of acute infarction in the L frontal and parietal lobes. PMH significant for PVC's, HTN, bilateral THR.   OT comments  Pt with improved ability to negotiate aligning his body with toilet and chair during mobility. Focus of session on standing grooming requiring multiple steps. Pt slow, requiring verbal cues to sequence, problem solve and for thoroughness. Pt questioned at end of session on his performance. Pt with poor insight into difficulties and differences from his baseline. Pt asking if he could drive, educated pt on implications of difficulty motor planning on the task of driving. Continues to be an excellent rehab candidate.  Follow Up Recommendations  CIR    Equipment Recommendations  Other (comment)(defer to next venue)    Recommendations for Other Services      Precautions / Restrictions Precautions Precautions: Fall       Mobility Bed Mobility Overal bed mobility: Needs Assistance Bed Mobility: Supine to Sit     Supine to sit: Supervision     General bed mobility comments: inefficient, increased time, no physical assist, poor regard for IV line  Transfers Overall transfer level: Needs assistance Equipment used: 1 person hand held assist Transfers: Sit to/from Stand Sit to Stand: Min guard         General transfer comment: slow to rise, reaching for OT's hand, blocking LEs on bed intially    Balance Overall balance assessment: Needs assistance   Sitting balance-Leahy Scale: Good       Standing balance-Leahy Scale: Fair Standing balance comment: statically at sink and toilet                           ADL either  performed or assessed with clinical judgement   ADL Overall ADL's : Needs assistance/impaired     Grooming: Oral care;Brushing hair;Standing;Minimal assistance Grooming Details (indicate cue type and reason): pt very slow to complete, difficulty sequencing and problem solving when toothpaste repeatedly kept falling off brush, pt perseverating on brushing R side of mouth, improved thoroughness with combing hair                 Toilet Transfer: Minimal assistance;Ambulation Toilet Transfer Details (indicate cue type and reason): stood to urinate Toileting- Clothing Manipulation and Hygiene: Supervision/safety(standing)       Functional mobility during ADLs: Minimal assistance(hands on and pushing IV pole)       Vision       Perception     Praxis      Cognition Arousal/Alertness: Awake/alert Behavior During Therapy: WFL for tasks assessed/performed Overall Cognitive Status: Impaired/Different from baseline Area of Impairment: Attention;Memory;Following commands;Safety/judgement;Awareness;Problem solving                   Current Attention Level: Selective Memory: Decreased short-term memory(repeating himself during session) Following Commands: Follows multi-step commands inconsistently Safety/Judgement: Decreased awareness of safety;Decreased awareness of deficits Awareness: Emergent Problem Solving: Slow processing;Decreased initiation;Difficulty sequencing;Requires verbal cues;Requires tactile cues          Exercises     Shoulder Instructions       General Comments      Pertinent Vitals/ Pain  Pain Assessment: No/denies pain  Home Living                                          Prior Functioning/Environment              Frequency  Min 3X/week        Progress Toward Goals  OT Goals(current goals can now be found in the care plan section)  Progress towards OT goals: Progressing toward goals  Acute Rehab OT  Goals Patient Stated Goal: Return to work OT Goal Formulation: With patient Time For Goal Achievement: 05/20/19 Potential to Achieve Goals: Good  Plan Discharge plan remains appropriate    Co-evaluation                 AM-PAC OT "6 Clicks" Daily Activity     Outcome Measure   Help from another person eating meals?: None Help from another person taking care of personal grooming?: A Little Help from another person toileting, which includes using toliet, bedpan, or urinal?: A Little Help from another person bathing (including washing, rinsing, drying)?: A Little Help from another person to put on and taking off regular upper body clothing?: A Little Help from another person to put on and taking off regular lower body clothing?: A Little 6 Click Score: 19    End of Session Equipment Utilized During Treatment: Gait belt  OT Visit Diagnosis: Unsteadiness on feet (R26.81);Other abnormalities of gait and mobility (R26.89);Apraxia (R48.2);Other symptoms and signs involving cognitive function   Activity Tolerance Patient tolerated treatment well   Patient Left in chair;with call bell/phone within reach;with chair alarm set   Nurse Communication          Time: [phone] OT Time Calculation (min): 26 min  Charges: OT General Charges $OT Visit: 1 Visit OT Treatments $Self Care/Home Management : 23-37 mins  Nestor Lewandowsky, OTR/L Acute Rehabilitation Services Pager: [phone] Office: [phone]   Malka So 05/07/2019, 8:36 AM

## 2019-05-08 ENCOUNTER — Inpatient Hospital Stay (HOSPITAL_COMMUNITY): Payer: Commercial Managed Care - PPO | Admitting: Certified Registered Nurse Anesthetist

## 2019-05-08 ENCOUNTER — Encounter (HOSPITAL_COMMUNITY)
Admission: EM | Disposition: A | Discharge status: Rehabilitation Facility | Payer: Self-pay | Source: Home / Self Care | Attending: Internal Medicine

## 2019-05-08 ENCOUNTER — Encounter (HOSPITAL_COMMUNITY): Payer: Self-pay

## 2019-05-08 ENCOUNTER — Inpatient Hospital Stay (HOSPITAL_COMMUNITY): Payer: Commercial Managed Care - PPO

## 2019-05-08 ENCOUNTER — Other Ambulatory Visit (HOSPITAL_COMMUNITY): Payer: Commercial Managed Care - PPO

## 2019-05-08 HISTORY — PX: RADIOLOGY WITH ANESTHESIA: SHX6223

## 2019-05-08 LAB — CBC WITH DIFFERENTIAL/PLATELET
Abs Immature Granulocytes: 0.02 K/uL (ref 0.00–0.07)
Basophils Absolute: 0 K/uL (ref 0.0–0.1)
Basophils Relative: 1 %
Eosinophils Absolute: 0.1 K/uL (ref 0.0–0.5)
Eosinophils Relative: 2 %
HCT: 40.1 % (ref 39.0–52.0)
Hemoglobin: 13.7 g/dL (ref 13.0–17.0)
Immature Granulocytes: 0 %
Lymphocytes Relative: 17 %
Lymphs Abs: 1.2 K/uL (ref 0.7–4.0)
MCH: 31.3 pg (ref 26.0–34.0)
MCHC: 34.2 g/dL (ref 30.0–36.0)
MCV: 91.6 fL (ref 80.0–100.0)
Monocytes Absolute: 0.5 K/uL (ref 0.1–1.0)
Monocytes Relative: 8 %
Neutro Abs: 4.9 K/uL (ref 1.7–7.7)
Neutrophils Relative %: 72 %
Platelets: 208 K/uL (ref 150–400)
RBC: 4.38 MIL/uL (ref 4.22–5.81)
RDW: 12.2 % (ref 11.5–15.5)
WBC: 6.8 K/uL (ref 4.0–10.5)
nRBC: 0 % (ref 0.0–0.2)

## 2019-05-08 LAB — BASIC METABOLIC PANEL WITH GFR
Anion gap: 8 (ref 5–15)
BUN: 21 mg/dL (ref 8–23)
CO2: 23 mmol/L (ref 22–32)
Calcium: 8.7 mg/dL — ABNORMAL LOW (ref 8.9–10.3)
Chloride: 109 mmol/L (ref 98–111)
Creatinine, Ser: 1.28 mg/dL — ABNORMAL HIGH (ref 0.61–1.24)
GFR calc Af Amer: >60 mL/min
GFR calc non Af Amer: 58 mL/min — ABNORMAL LOW
Glucose, Bld: 107 mg/dL — ABNORMAL HIGH (ref 70–99)
Potassium: 4 mmol/L (ref 3.5–5.1)
Sodium: 140 mmol/L (ref 135–145)

## 2019-05-08 LAB — PLATELET INHIBITION P2Y12: Platelet Function  P2Y12: 234 [PRU] (ref 182–335)

## 2019-05-08 LAB — SARS CORONAVIRUS 2 BY RT PCR (HOSPITAL ORDER, PERFORMED IN ~~LOC~~ HOSPITAL LAB): SARS Coronavirus 2: NEGATIVE

## 2019-05-08 SURGERY — RADIOLOGY WITH ANESTHESIA
Anesthesia: General

## 2019-05-08 MED ORDER — VANCOMYCIN HCL 1000 MG IV SOLR
INTRAVENOUS | Status: DC | PRN
  Administered 2019-05-08: 1000 mg via INTRAVENOUS

## 2019-05-08 MED ORDER — TICAGRELOR 90 MG PO TABS
90.0000 mg | ORAL_TABLET | Freq: Two times a day (BID) | ORAL | Status: DC
  Administered 2019-05-08 – 2019-05-11 (×7): 90 mg via ORAL
  Filled 2019-05-08 (×7): qty 1

## 2019-05-08 MED ORDER — LIDOCAINE 2% (20 MG/ML) 5 ML SYRINGE
INTRAMUSCULAR | Status: AC
  Filled 2019-05-08: qty 5

## 2019-05-08 MED ORDER — FENTANYL CITRATE (PF) 250 MCG/5ML IJ SOLN
INTRAMUSCULAR | Status: AC
  Filled 2019-05-08: qty 5

## 2019-05-08 MED ORDER — HEPARIN SODIUM (PORCINE) 1000 UNIT/ML IJ SOLN
INTRAMUSCULAR | Status: AC
  Filled 2019-05-08: qty 1

## 2019-05-08 MED ORDER — NITROGLYCERIN 1 MG/10 ML FOR IR/CATH LAB
INTRA_ARTERIAL | Status: AC
  Filled 2019-05-08: qty 10

## 2019-05-08 MED ORDER — PROPOFOL 500 MG/50ML IV EMUL
INTRAVENOUS | Status: DC | PRN
  Administered 2019-05-08: 50 ug/kg/min via INTRAVENOUS

## 2019-05-08 MED ORDER — LIDOCAINE HCL (CARDIAC) PF 100 MG/5ML IV SOSY
PREFILLED_SYRINGE | INTRAVENOUS | Status: DC | PRN
  Administered 2019-05-08: 60 mg via INTRATRACHEAL

## 2019-05-08 MED ORDER — LIDOCAINE HCL 1 % IJ SOLN
INTRAMUSCULAR | Status: AC
  Filled 2019-05-08: qty 20

## 2019-05-08 MED ORDER — ASPIRIN 81 MG PO CHEW
81.0000 mg | CHEWABLE_TABLET | Freq: Every day | ORAL | Status: DC
  Administered 2019-05-09 – 2019-05-11 (×3): 81 mg via ORAL
  Filled 2019-05-08 (×3): qty 1

## 2019-05-08 MED ORDER — SUCCINYLCHOLINE CHLORIDE 200 MG/10ML IV SOSY
PREFILLED_SYRINGE | INTRAVENOUS | Status: AC
  Filled 2019-05-08: qty 10

## 2019-05-08 MED ORDER — SODIUM CHLORIDE 0.9 % IV SOLN
INTRAVENOUS | Status: DC | PRN
  Administered 2019-05-08: 25 ug/min via INTRAVENOUS

## 2019-05-08 MED ORDER — FENTANYL CITRATE (PF) 100 MCG/2ML IJ SOLN
INTRAMUSCULAR | Status: AC
  Filled 2019-05-08: qty 2

## 2019-05-08 MED ORDER — MIDAZOLAM HCL 2 MG/2ML IJ SOLN
INTRAMUSCULAR | Status: AC
  Filled 2019-05-08: qty 2

## 2019-05-08 MED ORDER — FENTANYL CITRATE (PF) 250 MCG/5ML IJ SOLN
INTRAMUSCULAR | Status: DC | PRN
  Administered 2019-05-08 (×2): 12.5 ug via INTRAVENOUS
  Administered 2019-05-08: 25 ug via INTRAVENOUS

## 2019-05-08 MED ORDER — ONDANSETRON HCL 4 MG/2ML IJ SOLN
INTRAMUSCULAR | Status: DC | PRN
  Administered 2019-05-08: 4 mg via INTRAVENOUS

## 2019-05-08 MED ORDER — PROPOFOL 10 MG/ML IV BOLUS
INTRAVENOUS | Status: AC
  Filled 2019-05-08: qty 20

## 2019-05-08 MED ORDER — VANCOMYCIN HCL IN DEXTROSE 1-5 GM/200ML-% IV SOLN
INTRAVENOUS | Status: AC
  Filled 2019-05-08: qty 200

## 2019-05-08 MED ORDER — VERAPAMIL HCL 2.5 MG/ML IV SOLN
INTRAVENOUS | Status: AC
  Filled 2019-05-08: qty 2

## 2019-05-08 MED ORDER — TICAGRELOR 90 MG PO TABS
180.0000 mg | ORAL_TABLET | Freq: Once | ORAL | Status: AC
  Administered 2019-05-08: 180 mg via ORAL
  Filled 2019-05-08: qty 2

## 2019-05-08 NOTE — Addendum Note (Signed)
Addendum  created 05/08/19 1337 by Wilburn Cornelia, CRNA   Intraprocedure LDAs edited, LDA properties accepted

## 2019-05-08 NOTE — Progress Notes (Signed)
STROKE TEAM PROGRESS NOTE    .    SUBJECTIVE (INTERVAL HISTORY) Pt neurologically stable with no symptoms or complaints today. Jose Kaufman Kitchen Unfortunately due to an emergency neuro interventional procedure patient's elective carotid angioplasty stenting was postponed today and may not have happen till next week OBJECTIVE Vitals:   05/07/19 2342 05/08/19 0356 05/08/19 0735 05/08/19 1249  BP: 122/81 (!) 141/80 135/77 129/75  Pulse: 77 78 73 68  Resp: 18 19 16 16   Temp: 97.9 F (36.6 C) 98 F (36.7 C) 98.2 F (36.8 C) 97.7 F (36.5 C)  TempSrc: Oral Oral Oral Oral  SpO2: 99% 97% 100%   Weight:      Height:        CBC:  Recent Labs  Lab 05/05/19 0825 05/08/19 0459  WBC 8.6 6.8  NEUTROABS 6.8 4.9  HGB 15.4 13.7  HCT 46.4 40.1  MCV 95.1 91.6  PLT 216 323    Basic Metabolic Panel:  Recent Labs  Lab 05/05/19 0825  05/07/19 0635 05/08/19 0459  NA 136   < > 140 140  K 3.8   < > 4.4 4.0  CL 103   < > 105 109  CO2 26   < > 27 23  GLUCOSE 103*   < > 98 107*  BUN 34*   < > 25* 21  CREATININE 1.56*   < > 1.35* 1.28*  CALCIUM 9.3   < > 9.1 8.7*  MG 2.2  --   --   --    < > = values in this interval not displayed.    Lipid Panel:     Component Value Date/Time   CHOL 167 05/06/2019 1602   TRIG 71 05/06/2019 1602   HDL 40 (L) 05/06/2019 1602   CHOLHDL 4.2 05/06/2019 1602   VLDL 14 05/06/2019 1602   LDLCALC 113 (H) 05/06/2019 1602   HgbA1c:  Lab Results  Component Value Date   HGBA1C 5.7 (H) 05/05/2019   Urine Drug Screen:     Component Value Date/Time   LABOPIA NONE DETECTED 05/05/2019 0825   COCAINSCRNUR NONE DETECTED 05/05/2019 0825   LABBENZ NONE DETECTED 05/05/2019 0825   AMPHETMU NONE DETECTED 05/05/2019 0825   THCU NONE DETECTED 05/05/2019 0825   LABBARB NONE DETECTED 05/05/2019 0825    Alcohol Level     Component Value Date/Time   ETH <10 05/05/2019 0825    IMAGING   Ct Angio Neck W Or Wo Contrast  Result Date: 05/06/2019 CLINICAL DATA:  Confusion.  Speech disturbance. Old strokes. Follow-up acute strokes left hemisphere. EXAM: CT ANGIOGRAPHY NECK TECHNIQUE: Multidetector CT imaging of the neck was performed using the standard protocol during bolus administration of intravenous contrast. Multiplanar CT image reconstructions and MIPs were obtained to evaluate the vascular anatomy. Carotid stenosis measurements (when applicable) are obtained utilizing NASCET criteria, using the distal internal carotid diameter as the denominator. CONTRAST:  50mL OMNIPAQUE IOHEXOL 350 MG/ML SOLN COMPARISON:  MRI study yesterday. FINDINGS: Aortic arch: Aortic atherosclerosis. No evidence of dissection. Maximal diameter of the ascending aorta 3.7 cm. Right carotid system: Common carotid artery is widely patent to the bifurcation. There is near occlusion of the ICA at the origin. There is a string sign through the region of the cervical internal carotid artery to the skull base. Left carotid system: Common carotid artery shows soft plaque with stenosis 40%. At the carotid bifurcation, there is extensive plaque with occlusion of the ICA. Vertebral arteries: Right subclavian artery is widely patent. There is  atherosclerotic plaque at the right vertebral artery origin with stenosis estimated at 50%. Beyond that, the right vertebral artery is widely patent to the basilar. The left subclavian artery shows a 50% stenosis in its proximal 3 cm. There is atherosclerotic plaque at the left vertebral artery origin with stenosis of 50%. Beyond that, the left vertebral artery is patent to the basilar. Skeleton: Ordinary mid cervical spondylosis. Other neck: No mass or lymphadenopathy. Upper chest: Negative IMPRESSION: Advanced atherosclerotic disease at the carotid bifurcation on the right with near occlusion the ICA. There is a patent string sign seen to the level of the skull base. Advanced atherosclerotic disease at the carotid bifurcation on the left with occlusion the ICA. No string sign  identified on this side. 50% stenosis at both vertebral artery origins. Long segment 50% stenosis affecting the proximal left subclavian artery. Electronically Signed   By: Nelson Chimes M.D.   On: 05/06/2019 16:18    Transthoracic Echocardiogram  Normal ejection fraction of 60 to 65%.  No wall motion abnormality  PHYSICAL EXAM Blood pressure 129/75, pulse 68, temperature 97.7 F (36.5 C), temperature source Oral, resp. rate 16, height 5\' 8"  (1.727 m), weight 76.1 kg, SpO2 100 %.  GEN-frail middle-aged Caucasian male no acute distress HEENT-  Normocephalic, normal conjunctiva Lungs- Respirations unlabored Extremities- No edema  Neurological Examination Mental Status: A&Ox4, conversation and memory intact. No anomia with objects, but there is word-finding difficulty during conversation w/hesitant speech at times Mild dysarthria. Can follow simple commands, but has difficulty with more complex commands such as cerebellar testing, which required showing him and assistance from me.  Cranial Nerves: II: Visual fields intact bilaterally, all 4 quadrants. PERRL.  III,IV, VI: Mild left ptosis. EOMI without nystagmus.  VII: No facial droop.  VIII: hearing intact to voice IX,X: No hoarseness or hypophonia XI: Head is midline XII: midline tongue extension Motor: Right :  Upper extremity   5/5                                      Left:     Upper extremity   5/5             Lower extremity   5/5                                                  Lower extremity   5/5 There is mildly increased extensor tone at left elbow and increased flexor tone at left wrist.  Subtle left pronator drift noted.   Diminished fine finger movements on the right.  T.  Orbits left  over right upper extremity.  Mild rightt grip weakness. Sensory: Temp intact x 4. Positive for extinction to RUE with DSS.  Deep Tendon Reflexes: wnl. Right toe downgoing. Left toe upgoing Cerebellar: No ataxia with FNF or HTS bilaterally, but  has difficulty following commands.  Gait: Deferred  HOME MEDICATIONS:  Medications Prior to Admission  Medication Sig Dispense Refill  . acetaminophen (TYLENOL) 325 MG tablet Take 650 mg by mouth every 6 (six) hours as needed for mild pain.     Jose Kaufman Kitchen aspirin EC 81 MG tablet Take 81 mg by mouth daily.    . ferrous sulfate 325 (65 FE) MG tablet Take 325 mg by  mouth 2 (two) times a week.    Jose Kaufman Kitchen lisinopril-hydrochlorothiazide (PRINZIDE,ZESTORETIC) 10-12.5 MG tablet Take 1 tablet by mouth daily.    . Omega-3 Fatty Acids (FISH OIL PO) Take 1 capsule by mouth every evening.     . thiamine (VITAMIN B-1) 100 MG tablet Take 100 mg by mouth 2 (two) times a week.        HOSPITAL MEDICATIONS:  . aspirin  81 mg Oral Daily  . atorvastatin  80 mg Oral q1800  . fentaNYL      . heparin      . lidocaine      . nitroGLYCERIN      . ticagrelor  90 mg Oral BID  . verapamil        ALLERGIES Allergies  Allergen Reactions  . Penicillins Other (See Comments)    Childhood reaction. Has patient had a PCN reaction causing immediate rash, facial/tongue/throat swelling, SOB or lightheadedness with hypotension: unsure Has patient had a PCN reaction causing severe rash involving mucus membranes or skin necrosis: unsure Has patient had a PCN reaction that required hospitalization:No Has patient had a PCN reaction occurring within the last 10 years:No If all of the above answers are "NO", then may proceed with Cephalosporin use   ASSESSMENT/PLAN Mr. Jose Kaufman is a 68 y.o. male with history of HTN, HLD, dysrhythmia presenting with acute left fronto-parietal strokes. MRI also reveals no antegrade flow in either ICA, with chronic occlusions suspected. Watershed mechanism for the strokes is therefore suspected in the setting of dehydration and hypotension and working outdoors in the heat  Stroke  Resultant- expressive and receptive dysphasia,  CT head- nothing acute seen  MRI head -old strokes bilat  frontal.paretal regions. New cortical infarcts in left frontal/parietal.   CTA H&N -Left ICA occlusion; Right String sign. IR consulted for Angiogram and possible stenting in am  4 vessel diagnostic cerebral catheter angiogram : Left ICA occlusion.  Near occlusive stenosis of proximal right ICA and bilateral vertebral artery origins.  2D Echo -normal ejection fraction 60 to 65%.  No wall motion abnormality.  Sars Corona Virus 2 neg  LDL - pending  HgbA1c - 5.7  UDS - neg   VTE prophylaxis - Lovenox  Diet per SLP  81mg  ASA daily prior to admission, now on ASA daily + Plavix x 3weeks, further recs pending Angiogram.   Patient counseled to be compliant with his antithrombotic medications  Ongoing aggressive stroke risk factor management  Therapy recommendations: Pending  Disposition:  Pending  Hypertension  Stable . Permissive hypertension (OK if < 220/120) but gradually normalize in 5-7 days . Long-term BP goal normotensive  Hyperlipidemia  Lipid lowering medication PTA:  Omega 3  LDL pending, goal < 70  Current lipid lowering medication:Lipitor 80mg  daily  Continue statin at discharge  No known Diabetes  Other Stroke Risk Factors  Previous Cigarette smoker  Hx stroke/TIA  Hospital day # 3    Patient has significant extracranial large vessel stenosis/occlusion involving all 4 vessels and is a significant risk for a large stroke and will benefit with endovascular revascularization.  Discussed with Dr. Estanislado Pandy recommend elective right ICA angioplasty/stenting tomorrow.  Maintain systolic blood pressure greater than 120 and avoid hypotension and maintain adequate hydration.  Patient will need aggressive risk factor modification and dual antiplatelet therapy of aspirin and Plavix for 3 months.  He remains at risk for recurrent strokes.  Discussed with   Dr. Lonny Prude.  In case his angioplasty procedure cannot be done next  Tuesday may discharge the patient home with  instructions to return back for elective procedure..  Greater than 50% time during this 25-minute visit was spent on counseling and coordination of care about his strokes and discussion about carotid stenosis and revascularization and stroke prevention.  Antony Contras, MD Medical Director [facility] Pager: [phone] 05/08/2019 2:09 PM  To contact Stroke Continuity provider, please refer to [email or web address] After hours, contact General Neurology

## 2019-05-08 NOTE — Anesthesia Preprocedure Evaluation (Addendum)
Anesthesia Evaluation  Patient identified by MRN, date of birth, ID band Patient awake    Reviewed: Allergy & Precautions, NPO status , Patient's Chart, lab work & pertinent test results  History of Anesthesia Complications (+) DIFFICULT IV STICK / SPECIAL LINE  Airway Mallampati: I  TM Distance: >3 FB Neck ROM: Full    Dental no notable dental hx.    Pulmonary neg pulmonary ROS, former smoker,    Pulmonary exam normal breath sounds clear to auscultation       Cardiovascular hypertension, negative cardio ROS Normal cardiovascular exam Rhythm:Regular Rate:Normal     Neuro/Psych CVA negative psych ROS   GI/Hepatic negative GI ROS, Neg liver ROS,   Endo/Other  negative endocrine ROS  Renal/GU negative Renal ROS  negative genitourinary   Musculoskeletal negative musculoskeletal ROS (+) Arthritis , Osteoarthritis,    Abdominal   Peds negative pediatric ROS (+)  Hematology negative hematology ROS (+)   Anesthesia Other Findings   Reproductive/Obstetrics negative OB ROS                             Anesthesia Physical  Anesthesia Plan  ASA: III  Anesthesia Plan: MAC   Post-op Pain Management:    Induction: Intravenous  PONV Risk Score and Plan: 2 and Ondansetron, Treatment may vary due to age or medical condition and Midazolam  Airway Management Planned: Oral ETT  Additional Equipment:   Intra-op Plan:   Post-operative Plan: Extubation in OR  Informed Consent: I have reviewed the patients History and Physical, chart, labs and discussed the procedure including the risks, benefits and alternatives for the proposed anesthesia with the patient or authorized representative who has indicated his/her understanding and acceptance.     Dental advisory given  Plan Discussed with: CRNA and Anesthesiologist  Anesthesia Plan Comments:        Anesthesia Quick Evaluation

## 2019-05-08 NOTE — Progress Notes (Signed)
SLP Cancellation Note  Patient Details Name: Jose Kaufman MRN: [id number] DOB: [birth year]   Cancelled treatment:       Reason Eval/Treat Not Completed: Other (comment)(pt having surgery today, will continue efforts)   Macario Golds 05/08/2019, 7:22 AM  Luanna Salk, MS [facility] SLP Acute Rehab Services Pager [phone] Office [phone]

## 2019-05-08 NOTE — Progress Notes (Signed)
Chief Complaint: Patient was seen in consultation today for right ICA and right VA stenosis/revascularization.  Referring Physician(s): Garvin Fila  Supervising Physician: [name]  Patient Status: [facility] - In-pt  History of Present Illness: Jose Kaufman is a 68 y.o. male with a past medical history of hypertension, hyperlipidemia, PVCs, CVA 04/2019, and arthritis. He presented to [facility] ED 05/05/2019 with complaints of AMS/confusion, gait instability, and difficulty with word finding. In ED, CT head unremarkable and MR brain revealed areas of acute infarction in the left frontal and parietal brain. Neurology was consulted who recommended transfer to [facility] for admission and further management. CTA neck was ordered and revealed bilateral ICA stenosis (possible left ICA occlusion, right ICA string sign). Based on these findings, neurology recommends NIR consultation for management. He underwent an image-guided diagnostic cerebral arteriogram 05/06/2019 by Dr. Estanislado Pandy, findings below. At that time, it was recommended that patient undergo endovascular revascularization of his right ICA stenosis verses right VA stenosis.  Diagnostic cerebral arteriogram 05/06/2019: 1. Occluded left ICA prox. 2. Severe preocclusive stenosis of right ICA prox. 3. Severe preocclusive  stenosis of bilateral vertebral arteries at the origins.  Patient presents today for possible image-guided cerebral arteriogram with possible revascularization of proximal right ICA stenosis versus right VA stenosis. Patient awake and alert laying in bed. Complains of slurred speech, stable since admission. Denies fever, chills, chest pain, dyspnea, abdominal pain, or headache.   Past Medical History:  Diagnosis Date  . Abnormality of gait 10/15/2016  . Arthritis   . Dysrhythmia    hx of  . Essential hypertension 03/14/2017  . PVCs (premature ventricular contractions) 03/14/2017    Past Surgical History:  Procedure  Laterality Date  . TOTAL HIP ARTHROPLASTY Right 03/22/2017   Procedure: RIGHT TOTAL HIP ARTHROPLASTY ANTERIOR APPROACH;  Surgeon: Mcarthur Rossetti, MD;  Location: [facility] ORS;  Service: Orthopedics;  Laterality: Right;  . TOTAL HIP ARTHROPLASTY     Left 08/23/17 Dr. Zollie Beckers  . TOTAL HIP ARTHROPLASTY Left 08/23/2017   Procedure: LEFT TOTAL HIP ARTHROPLASTY ANTERIOR APPROACH;  Surgeon: Mcarthur Rossetti, MD;  Location: [facility] ORS;  Service: Orthopedics;  Laterality: Left;    Allergies: Penicillins  Medications: Prior to Admission medications   Medication Sig Start Date End Date Taking? Authorizing Provider  acetaminophen (TYLENOL) 325 MG tablet Take 650 mg by mouth every 6 (six) hours as needed for mild pain.    Yes [provider]  aspirin EC 81 MG tablet Take 81 mg by mouth daily.   Yes [provider]  ferrous sulfate 325 (65 FE) MG tablet Take 325 mg by mouth 2 (two) times a week.   Yes [provider]  lisinopril-hydrochlorothiazide (PRINZIDE,ZESTORETIC) 10-12.5 MG tablet Take 1 tablet by mouth daily.   Yes [provider]  Omega-3 Fatty Acids (FISH OIL PO) Take 1 capsule by mouth every evening.    Yes [provider]  thiamine (VITAMIN B-1) 100 MG tablet Take 100 mg by mouth 2 (two) times a week.   Yes [provider]     Family History  Problem Relation Age of Onset  . Leukemia Mother   . Pneumonia Father     Social History   Socioeconomic History  . Marital status: Divorced    Spouse name: Not on file  . Number of children: 2  . Years of education: 96  . Highest education level: Not on file  Occupational History  . Occupation: [employer]  .  Financial resource strain: Not on file  . Food insecurity:    Worry: Not on file    Inability: Not on file  . Transportation needs:    Medical: Not on file    Non-medical: Not on file  Tobacco Use  . Smoking status: Former Smoker    Types: Cigarettes     Last attempt to quit: 05/20/1992    Years since quitting: 26.9  . Smokeless tobacco: Never Used  Substance and Sexual Activity  . Alcohol use: Yes    Comment: Occasional  . Drug use: No  . Sexual activity: Yes  Lifestyle  . Physical activity:    Days per week: Not on file    Minutes per session: Not on file  . Stress: Not on file  Relationships  . Social connections:    Talks on phone: Not on file    Gets together: Not on file    Attends religious service: Not on file    Active member of club or organization: Not on file    Attends meetings of clubs or organizations: Not on file    Relationship status: Not on file  Other Topics Concern  . Not on file  Social History Narrative   Pt lives at home, divorced   Right-handed   Caffeine: soda daily     Review of Systems: A 12 point ROS discussed and pertinent positives are indicated in the HPI above.  All other systems are negative.  Review of Systems  Constitutional: Negative for chills and fever.  Respiratory: Negative for shortness of breath and wheezing.   Cardiovascular: Negative for chest pain and palpitations.  Gastrointestinal: Negative for abdominal pain.  Neurological: Positive for speech difficulty. Negative for headaches.  Psychiatric/Behavioral: Negative for behavioral problems and confusion.    Vital Signs: BP 135/77 (BP Location: Left Arm)   Pulse 73   Temp 98.2 F (36.8 C) (Oral)   Resp 16   Ht 5\' 8"  (1.727 m)   Wt 167 lb 12.3 oz (76.1 kg)   SpO2 100%   BMI 25.51 kg/m   Physical Exam Vitals signs and nursing note reviewed.  Constitutional:      General: He is not in acute distress.    Appearance: Normal appearance.  Cardiovascular:     Rate and Rhythm: Normal rate and regular rhythm.     Heart sounds: Normal heart sounds. No murmur.  Pulmonary:     Effort: Pulmonary effort is normal. No respiratory distress.     Breath sounds: Normal breath sounds. No wheezing.  Skin:    General: Skin is  warm and dry.  Neurological:     Mental Status: He is alert and oriented to person, place, and time.  Psychiatric:        Mood and Affect: Mood normal.        Behavior: Behavior normal.        Thought Content: Thought content normal.        Judgment: Judgment normal.      MD Evaluation Airway: WNL Heart: WNL Abdomen: WNL Chest/ Lungs: WNL ASA  Classification: 3 Mallampati/Airway Score: Two   Imaging: Ct Head Wo Contrast  Result Date: 05/05/2019 CLINICAL DATA:  Confusion and speech disturbance beginning 2 days ago. EXAM: CT HEAD WITHOUT CONTRAST TECHNIQUE: Contiguous axial images were obtained from the base of the skull through the vertex without intravenous contrast. COMPARISON:  None. FINDINGS: Brain: No acute finding by CT. The brainstem and cerebellum appear normal. There are old appearing  cortical and subcortical infarctions in the frontal and parietal regions of both hemispheres. None of these are discernibly recent. No mass lesion, hemorrhage, hydrocephalus or extra-axial collection. Vascular: No abnormal vascular finding. Skull: Negative Sinuses/Orbits: Scattered retention cysts. No sign of acute sinusitis. Orbits negative. Other: None IMPRESSION: No acute finding by CT. Old bilateral frontal and parietal cortical and subcortical infarctions. Electronically Signed   By: Nelson Chimes M.D.   On: 05/05/2019 09:48   Ct Angio Neck W Or Wo Contrast  Result Date: 05/06/2019 CLINICAL DATA:  Confusion. Speech disturbance. Old strokes. Follow-up acute strokes left hemisphere. EXAM: CT ANGIOGRAPHY NECK TECHNIQUE: Multidetector CT imaging of the neck was performed using the standard protocol during bolus administration of intravenous contrast. Multiplanar CT image reconstructions and MIPs were obtained to evaluate the vascular anatomy. Carotid stenosis measurements (when applicable) are obtained utilizing NASCET criteria, using the distal internal carotid diameter as the denominator. CONTRAST:   67mL OMNIPAQUE IOHEXOL 350 MG/ML SOLN COMPARISON:  MRI study yesterday. FINDINGS: Aortic arch: Aortic atherosclerosis. No evidence of dissection. Maximal diameter of the ascending aorta 3.7 cm. Right carotid system: Common carotid artery is widely patent to the bifurcation. There is near occlusion of the ICA at the origin. There is a string sign through the region of the cervical internal carotid artery to the skull base. Left carotid system: Common carotid artery shows soft plaque with stenosis 40%. At the carotid bifurcation, there is extensive plaque with occlusion of the ICA. Vertebral arteries: Right subclavian artery is widely patent. There is atherosclerotic plaque at the right vertebral artery origin with stenosis estimated at 50%. Beyond that, the right vertebral artery is widely patent to the basilar. The left subclavian artery shows a 50% stenosis in its proximal 3 cm. There is atherosclerotic plaque at the left vertebral artery origin with stenosis of 50%. Beyond that, the left vertebral artery is patent to the basilar. Skeleton: Ordinary mid cervical spondylosis. Other neck: No mass or lymphadenopathy. Upper chest: Negative IMPRESSION: Advanced atherosclerotic disease at the carotid bifurcation on the right with near occlusion the ICA. There is a patent string sign seen to the level of the skull base. Advanced atherosclerotic disease at the carotid bifurcation on the left with occlusion the ICA. No string sign identified on this side. 50% stenosis at both vertebral artery origins. Long segment 50% stenosis affecting the proximal left subclavian artery. Electronically Signed   By: Nelson Chimes M.D.   On: 05/06/2019 16:18   Mr Brain Wo Contrast  Result Date: 05/05/2019 CLINICAL DATA:  Confusion. Speech disturbance. Old strokes. Assess for acute finding. Abnormal head CT. EXAM: MRI HEAD WITHOUT CONTRAST TECHNIQUE: Multiplanar, multiecho pulse sequences of the brain and surrounding structures were  obtained without intravenous contrast. COMPARISON:  Head CT same day FINDINGS: Brain: There is a background pattern of old cortical and subcortical infarctions and both frontal and parietal regions. There are scattered areas of acute infarction affecting the left frontal and parietal cortical and subcortical brain consistent with embolic and or watershed infarctions on the left. No evidence of mass effect or hemorrhage. No acute infarctions seen on the right or in the posterior circulation brain. No hydrocephalus. No extra-axial collection. Vascular: There appears to be abnormal flow in both internal carotid arteries at the skull base level. These could be chronically occluded. Vertebrobasilar system does show flow. Skull and upper cervical spine: Negative Sinuses/Orbits: Retention cyst right maxillary sinus. Orbits negative. Other: None IMPRESSION: Old cortical and subcortical infarctions in both frontal and parietal  regions. Areas of acute infarction in the left frontal and parietal brain without significant swelling or any acute hemorrhage. No antegrade flow is seen in either internal carotid artery and chronic occlusion is suspected. Posterior circulation does show patency. These acute infarctions could be embolic or watershed in nature. Electronically Signed   By: Nelson Chimes M.D.   On: 05/05/2019 12:00   Dg Chest Portable 1 View  Result Date: 05/05/2019 CLINICAL DATA:  Mental status changes EXAM: PORTABLE CHEST 1 VIEW COMPARISON:  05/03/2016 FINDINGS: Bibasilar opacities, likely atelectasis. Heart is normal size. No effusions. No acute bony abnormality. IMPRESSION: Bibasilar airspace opacities, atelectasis versus early infiltrates. Electronically Signed   By: Rolm Baptise M.D.   On: 05/05/2019 08:51    Labs:  CBC: Recent Labs    05/05/19 0825 05/08/19 0459  WBC 8.6 6.8  HGB 15.4 13.7  HCT 46.4 40.1  PLT 216 208    COAGS: Recent Labs    05/05/19 0825  INR 1.1  APTT 34    BMP:  Recent Labs    05/05/19 0825 05/06/19 1132 05/07/19 0635 05/08/19 0459  NA 136 137 140 140  K 3.8 4.5 4.4 4.0  CL 103 104 105 109  CO2 26 23 27 23   GLUCOSE 103* 101* 98 107*  BUN 34* 28* 25* 21  CALCIUM 9.3 9.3 9.1 8.7*  CREATININE 1.56* 1.44* 1.35* 1.28*  GFRNONAA 46* 51* 55* 58*  GFRAA 53* 59* >60 >60    LIVER FUNCTION TESTS: Recent Labs    05/05/19 0825  BILITOT 0.8  AST 19  ALT 20  ALKPHOS 74  PROT 8.0  ALBUMIN 4.7     Assessment and Plan:  Proximal right ICA stenosis and right VA stenosis. Plan for image-guided cerebral arteriogram with possible revascularization of proximal right ICA stenosis versus right VA stenosis today with Dr. Estanislado Pandy. Patient is NPO. Afebrile and WBCs WNL. Lovenox has been held per IR protocol. [id number] 234 PRU this AM- per Dr. Estanislado Pandy, discontinue Plavix and Aspirin 325 use and load with Brilinta 180 mg and proceed with procedure; following procedure begin with Brilinta 90 mg twice daily and Aspirin 81 mg once daily. INR 1.1 05/05/2019. COVID testing negative this AM.  Risks and benefits of cerebral arteriogram with intervention were discussed with the patient including, but not limited to bleeding, infection, vascular injury, contrast induced renal failure, stroke or even death. This interventional procedure involves the use of X-rays and because of the nature of the planned procedure, it is possible that we will have prolonged use of X-ray fluoroscopy. Potential radiation risks to you include (but are not limited to) the following: - A slightly elevated risk for cancer  several years later in life. This risk is typically less than 0.5% percent. This risk is low in comparison to the normal incidence of human cancer, which is 33% for women and 50% for men according to the [place]. - Radiation induced injury can include skin redness, resembling a rash, tissue breakdown / ulcers and hair loss (which can be temporary or  permanent).  The likelihood of either of these occurring depends on the difficulty of the procedure and whether you are sensitive to radiation due to previous procedures, disease, or genetic conditions.  IF your procedure requires a prolonged use of radiation, you will be notified and given written instructions for further action.  It is your responsibility to monitor the irradiated area for the 2 weeks following the procedure and to notify your physician  if you are concerned that you have suffered a radiation induced injury.   All of the patient's questions were answered, patient is agreeable to proceed. Consent signed and in chart.   Thank you for this interesting consult.  I greatly enjoyed meeting [facility] and look forward to participating in their care.  A copy of this report was sent to the requesting provider on this date.  Electronically Signed: Earley Abide, PA-C 05/08/2019, 9:28 AM   I spent a total of 40 Minutes in face to face in clinical consultation, greater than 50% of which was counseling/coordinating care for right ICA and right VA stenosis/revascularization.

## 2019-05-08 NOTE — Anesthesia Postprocedure Evaluation (Signed)
Anesthesia Post Note  Patient: Jose Kaufman  Procedure(s) Performed: RADIOLOGY WITH ANESTHESIA (N/A )     Patient location during evaluation: PACU Anesthesia Type: General Level of consciousness: awake and alert Pain management: pain level controlled Vital Signs Assessment: post-procedure vital signs reviewed and stable Respiratory status: spontaneous breathing, nonlabored ventilation and respiratory function stable Cardiovascular status: blood pressure returned to baseline and stable Postop Assessment: no apparent nausea or vomiting Anesthetic complications: no    Last Vitals:  Vitals:   05/08/19 0735 05/08/19 1249  BP: 135/77 129/75  Pulse: 73 68  Resp: 16 16  Temp: 36.8 C 36.5 C  SpO2: 100%     Last Pain:  Vitals:   05/08/19 1249  TempSrc: Oral  PainSc:                  Lynda Rainwater

## 2019-05-08 NOTE — Anesthesia Procedure Notes (Signed)
Arterial Line Insertion Start/End5/29/2020 9:00 AM, 05/08/2019 9:15 AM Performed by: Elayne Snare, CRNA, CRNA  Patient location: Pre-op. Lidocaine 1% used for infiltration Left, radial was placed Catheter size: 20 G Hand hygiene performed  and maximum sterile barriers used   Attempts: 2 Procedure performed without using ultrasound guided technique. Following insertion, dressing applied and Biopatch. Post procedure assessment: normal  Post procedure complications: local hematoma.

## 2019-05-08 NOTE — Progress Notes (Signed)
[facility] PROGRESS NOTE  Jose Kaufman HEN:[id number] DOB: 08-27-1951 DOA: 05/05/2019 PCP: Michel Harrow, PA-C  Assessment/Plan: 1. Watershed left hemispheric infarct in setting of dehydration hypotension.  Imaging shows near occlusive extracranial carotid stenosis, diagnostic evaluation with cerebral catheter angiogram confirms, unfortunately emergent neuro procedure prevented IR  revascularization of right ICA stenosis on 5/29, IR would like patient to remain hospitalized until next available slot tentatively on 06/2 , hold lovenox, continue aspirin and plavix was discontinued for aspirin and brilinta per IR prior to procedure, repeat COPD testing given need for general anesthesia, .  Maintain SBP greater than 120 and avoid hypotension per neurology recommendations so we will continue IV fluids given patient was recently dehydrated on presentation and high risk for worsening symptoms.  Continue aspirin and Plavix for 3 months.  At high risk for recurrent strokes.Speech evaluating language given expressive/receptive aphasia 2. Hypertension, normotensive here.   Will hold home BP medications, continue IV fluids as mentioned above 3. HLD, stable.  Started on Lipitor, LDL 113.  Code Status: Full code Family Communication: No family at bedside (indicate person spoken with, relationship, and if by phone, the number) Disposition Plan: Plan for attempt at R ICA revascularization but not until Tuesday, PT recommends CIR   Consultants:  Neurology  Procedures: Cerebral angiogram, 5/28: Findings. 1.Occluded left ICA prox. 2.Severe preocclusive stenosis of right ICA prox.  3. 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 year old male with medical history significant for HTN, HLD who presented on 05/05/2019 with slurred speech, difficulty with  comprehension and word finding and lower extremity weakness that he noticed on 5/23 normal in the yard which he initially attributed to "heat stroke" and was found to have acute infarct of left frontoparietal brain as well as old cortical/subcortical infarcts..     Objective: Vitals:   05/08/19 1604 05/08/19 1941  BP: 129/68 125/74  Pulse: 67 78  Resp:  18  Temp: 98.2 F (36.8 C) 98.3 F (36.8 C)  SpO2: 100% 98%    Intake/Output Summary (Last 24 hours) at 05/08/2019 2238 Last data filed at 05/08/2019 1900 Gross per 24 hour  Intake 740 ml  Output 1250 ml  Net -510 ml   Filed Weights   05/05/19 0825 05/05/19 2144  Weight: 77.1 kg 76.1 kg    Exam:   Did not see patient. Was at procedure  Data Reviewed: Basic Metabolic Panel: Recent Labs  Lab 05/05/19 0825 05/06/19 1132 05/07/19 0635 05/08/19 0459  NA 136 137 140 140  K 3.8 4.5 4.4 4.0  CL 103 104 105 109  CO2 26 23 27 23   GLUCOSE 103* 101* 98 107*  BUN 34* 28* 25* 21  CREATININE 1.56* 1.44* 1.35* 1.28*  CALCIUM 9.3 9.3 9.1 8.7*  MG 2.2  --   --   --    Liver Function Tests: Recent Labs  Lab 05/05/19 0825  AST 19  ALT 20  ALKPHOS 74  BILITOT 0.8  PROT 8.0  ALBUMIN 4.7   No results for input(s): LIPASE, AMYLASE in the last 168 hours. No results for input(s): AMMONIA in the last 168 hours. CBC: Recent Labs  Lab 05/05/19 0825 05/08/19 0459  WBC 8.6 6.8  NEUTROABS 6.8 4.9  HGB 15.4 13.7  HCT 46.4 40.1  MCV 95.1 91.6  PLT 216 208   Cardiac Enzymes: Recent Labs  Lab 05/06/19 1132  CKTOTAL 151   BNP (last 3 results) No results for input(s): BNP in the last 8760 hours.  ProBNP (last 3 results) No results for input(s): PROBNP in the last 8760 hours.  CBG: No results for input(s): GLUCAP in the last 168 hours.  Recent Results (from the past 240 hour(s))  SARS Coronavirus 2 (CEPHEID - Performed in [place] hospital lab), Hosp Order     Status: None   Collection Time: 05/05/19  8:59 AM   Result Value Ref Range Status   SARS Coronavirus 2 NEGATIVE NEGATIVE Final    Comment: (NOTE) If result is NEGATIVE SARS-CoV-2 target nucleic acids are NOT DETECTED. The SARS-CoV-2 RNA is generally detectable in upper and lower  respiratory specimens during the acute phase of infection. The lowest  concentration of SARS-CoV-2 viral copies this assay can detect is 250  copies / mL. A negative result does not preclude SARS-CoV-2 infection  and should not be used as the sole basis for treatment or other  patient management decisions.  A negative result may occur with  improper specimen collection / handling, submission of specimen other  than nasopharyngeal swab, presence of viral mutation(s) within the  areas targeted by this assay, and inadequate number of viral copies  (<250 copies / mL). A negative result must be combined with clinical  observations, patient history, and epidemiological information. If result is POSITIVE SARS-CoV-2 target nucleic acids are DETECTED. The SARS-CoV-2 RNA is generally detectable in upper and lower  respiratory specimens dur ing the acute phase of infection.  Positive  results are indicative of active infection with SARS-CoV-2.  Clinical  correlation with patient history and other diagnostic information is  necessary to determine patient infection status.  Positive results do  not rule out bacterial infection or co-infection with other viruses. If result is PRESUMPTIVE POSTIVE SARS-CoV-2 nucleic acids MAY BE PRESENT.   A presumptive positive result was obtained on the submitted specimen  and confirmed on repeat testing.  While 2019 novel coronavirus  (SARS-CoV-2) nucleic acids may be present in the submitted sample  additional confirmatory testing may be necessary for epidemiological  and / or clinical management purposes  to differentiate between  SARS-CoV-2 and other Sarbecovirus currently known to infect humans.  If clinically indicated additional  testing with an alternate test  methodology [phone]) is advised. The SARS-CoV-2 RNA is generally  detectable in upper and lower respiratory sp ecimens during the acute  phase of infection. The expected result is Negative. Fact Sheet for Patients:  StrictlyIdeas.no Fact Sheet for Healthcare Providers: BankingDealers.co.za This test is not yet approved or cleared by the [place] FDA and has been authorized for detection and/or diagnosis of SARS-CoV-2 by FDA under an Emergency Use Authorization (EUA).  This EUA will remain in effect (meaning this test can be used) for the duration of the COVID-19 declaration under Section 564(b)(1) of the Act, 21 U.S.C. section 360bbb-3(b)(1), unless the authorization is terminated or revoked sooner. Performed at [facility], [place] [address], [place], [place] [address]   SARS Coronavirus 2 (CEPHEID - Performed in [place] hospital lab), Hosp Order     Status: None   Collection Time: 05/08/19  8:24 AM  Result Value Ref Range Status   SARS Coronavirus 2 NEGATIVE NEGATIVE Final    Comment: (NOTE) If result is NEGATIVE SARS-CoV-2 target nucleic acids are NOT DETECTED. The SARS-CoV-2 RNA is generally detectable in upper and lower  respiratory specimens during the acute phase  of infection. The lowest  concentration of SARS-CoV-2 viral copies this assay can detect is 250  copies / mL. A negative result does not preclude SARS-CoV-2 infection  and should not be used as the sole basis for treatment or other  patient management decisions.  A negative result may occur with  improper specimen collection / handling, submission of specimen other  than nasopharyngeal swab, presence of viral mutation(s) within the  areas targeted by this assay, and inadequate number of viral copies  (<250 copies / mL). A negative result must be combined with clinical  observations, patient history, and  epidemiological information. If result is POSITIVE SARS-CoV-2 target nucleic acids are DETECTED. The SARS-CoV-2 RNA is generally detectable in upper and lower  respiratory specimens dur ing the acute phase of infection.  Positive  results are indicative of active infection with SARS-CoV-2.  Clinical  correlation with patient history and other diagnostic information is  necessary to determine patient infection status.  Positive results do  not rule out bacterial infection or co-infection with other viruses. If result is PRESUMPTIVE POSTIVE SARS-CoV-2 nucleic acids MAY BE PRESENT.   A presumptive positive result was obtained on the submitted specimen  and confirmed on repeat testing.  While 2019 novel coronavirus  (SARS-CoV-2) nucleic acids may be present in the submitted sample  additional confirmatory testing may be necessary for epidemiological  and / or clinical management purposes  to differentiate between  SARS-CoV-2 and other Sarbecovirus currently known to infect humans.  If clinically indicated additional testing with an alternate test  methodology [phone]) is advised. The SARS-CoV-2 RNA is generally  detectable in upper and lower respiratory sp ecimens during the acute  phase of infection. The expected result is Negative. Fact Sheet for Patients:  StrictlyIdeas.no Fact Sheet for Healthcare Providers: BankingDealers.co.za This test is not yet approved or cleared by the [place] FDA and has been authorized for detection and/or diagnosis of SARS-CoV-2 by FDA under an Emergency Use Authorization (EUA).  This EUA will remain in effect (meaning this test can be used) for the duration of the COVID-19 declaration under Section 564(b)(1) of the Act, 21 U.S.C. section 360bbb-3(b)(1), unless the authorization is terminated or revoked sooner. Performed at [facility], [place] [address] [place], [place] [address]       Studies: Ir Angio Intra Extracran Sel Com Carotid Innominate Bilat Mod Sed  Result Date: 05/08/2019 CLINICAL DATA:  New onset aphasia and mild right-sided weakness. Abnormal MRI of the brain. EXAM: IR ANGIO VERTEBRAL SEL VERTEBRAL BILAT MOD SED; BILATERAL COMMON CAROTID AND INNOMINATE ANGIOGRAPHY COMPARISON:  MRI of the brain of 05/05/2019 and CT angiogram of the head and neck of 05/06/2019. MEDICATIONS: Heparin 1000 units IV; no. The antibiotic was administered within 1 hour of the procedure. ANESTHESIA/SEDATION: Versed 1 mg IV; Fentanyl 25 mcg IV Moderate Sedation Time:  38 minutes The patient was continuously monitored during the procedure by the interventional radiology nurse under my direct supervision. CONTRAST:  Isovue 300 approximately 60 mL. FLUOROSCOPY TIME:  Fluoroscopy Time: 13 minutes 54 seconds (1365 mGy). COMPLICATIONS: None immediate. TECHNIQUE: Informed written consent was obtained from the patient after a thorough discussion of the procedural risks, benefits and alternatives. All questions were addressed. Maximal Sterile Barrier Technique was utilized including caps, mask, sterile gowns, sterile gloves, sterile drape, hand hygiene and skin antiseptic. A timeout was performed prior to the initiation of the procedure. The right groin was prepped and draped in the usual sterile fashion. Thereafter using modified  Seldinger technique, transfemoral access into the right common femoral artery was obtained without difficulty. Over a 0.035 inch guidewire, a 5 French Pinnacle sheath was inserted. Through this, and also over 0.035 inch guidewire, a 5 [place] JB 1 catheter was advanced to the aortic arch region and selectively positioned in the right common carotid artery, right subclavian artery, the left common carotid artery and the left subclavian artery. FINDINGS: The right common carotid arteriogram demonstrates the right external carotid artery and its major branches to be widely patent. The right  internal carotid artery at the bulb to the cranial skull base has a smooth string sign. The vessel is contiguous with the petrous cavernous junction of the right internal carotid artery. Distally the supraclinoid segment is widely patent. Opacification is noted of the right middle cerebral artery and the right anterior cerebral artery into the capillary and venous phases. Patchy leptomeningeal and left pial collaterals seen applying the cortical surface of the parasagittal regions of the right cerebral convexity. The right vertebral artery origin demonstrates severe pre occlusive stenosis. Antegrade flow is seen in the vertebral artery to the cranial skull base. Wide patency is seen of the right vertebrobasilar junction and the right posterior-inferior cerebellar artery. The opacified portion of the basilar artery, the posterior cerebral arteries, the superior cerebellar arteries and the anterior-inferior cerebellar arteries is grossly intact into the capillary and venous phases. Unopacified blood is seen in the basilar artery from the contralateral vertebral artery. There is retrograde opacification via a left posterior communicating artery of the left MCA distribution. Partial collateralization is also noted from leptomeningeal collaterals arising from the right posterior temporal branch of the right posterior cerebral artery. The left vertebral artery origin demonstrates severe pre occlusive stenosis. The vessel is seen to opacify to the cranial skull base. Patency is seen of the left posterior inferior cerebellar artery and the left vertebrobasilar junction. The basilar artery, the posterior cerebral arteries, the superior cerebellar arteries and the anterior-inferior cerebellar arteries opacify into the capillary and venous phases. Retrograde opacification via the left posterior communicating artery is seen of the left middle cerebral artery distribution. Also demonstrated are leptomeningeal collaterals arising  from the right posterior cerebral artery posterior temporal branch supplying the cortical regions right temporal lobe and the right parietal lobe regions. The delayed arterial phase subsequently also demonstrates retrograde opacification of the anterior cerebral arteries bilaterally with subsequent retrograde opacification into the right MCA distribution cortical branches. The left common carotid arteriogram demonstrates nonvisualization of the left internal carotid artery at the bulb or at the cranial skull base. There is no evidence of a angiographic string sign on the delayed arterial phase. Partial retrograde opacification of the left vertebral artery at the level of C1 is seen via the left occipital artery. Intracranially there is partial reconstitution of the left internal carotid artery cavernous segment and supraclinoid segment. Minimal flow is noted into the left MCA distribution. These are via the prominent anastomosis via the artery of the foramen of rotundum, and of the foramen ovale. IMPRESSION: Angiographically occluded left internal carotid artery extra cranially with partial reconstitution from the external carotid artery branches as described above. Severe high-grade stenosis of the right internal carotid artery proximally with delayed angiographic string sign being contiguous with the proximal cavernous segment of the right internal carotid artery. Severe high-grade pre occlusive stenosis of both vertebral artery origins. PLAN: Findings were discussed with the patient and the referring stroke neurologist. Electronically Signed   By: Corky Downs.D.  On: 05/07/2019 17:24   Ir Angio Vertebral Sel Vertebral Bilat Mod Sed  Result Date: 05/08/2019 CLINICAL DATA:  New onset aphasia and mild right-sided weakness. Abnormal MRI of the brain. EXAM: IR ANGIO VERTEBRAL SEL VERTEBRAL BILAT MOD SED; BILATERAL COMMON CAROTID AND INNOMINATE ANGIOGRAPHY COMPARISON:  MRI of the brain of 05/05/2019 and  CT angiogram of the head and neck of 05/06/2019. MEDICATIONS: Heparin 1000 units IV; no. The antibiotic was administered within 1 hour of the procedure. ANESTHESIA/SEDATION: Versed 1 mg IV; Fentanyl 25 mcg IV Moderate Sedation Time:  38 minutes The patient was continuously monitored during the procedure by the interventional radiology nurse under my direct supervision. CONTRAST:  Isovue 300 approximately 60 mL. FLUOROSCOPY TIME:  Fluoroscopy Time: 13 minutes 54 seconds (1365 mGy). COMPLICATIONS: None immediate. TECHNIQUE: Informed written consent was obtained from the patient after a thorough discussion of the procedural risks, benefits and alternatives. All questions were addressed. Maximal Sterile Barrier Technique was utilized including caps, mask, sterile gowns, sterile gloves, sterile drape, hand hygiene and skin antiseptic. A timeout was performed prior to the initiation of the procedure. The right groin was prepped and draped in the usual sterile fashion. Thereafter using modified Seldinger technique, transfemoral access into the right common femoral artery was obtained without difficulty. Over a 0.035 inch guidewire, a 5 French Pinnacle sheath was inserted. Through this, and also over 0.035 inch guidewire, a 5 [place] JB 1 catheter was advanced to the aortic arch region and selectively positioned in the right common carotid artery, right subclavian artery, the left common carotid artery and the left subclavian artery. FINDINGS: The right common carotid arteriogram demonstrates the right external carotid artery and its major branches to be widely patent. The right internal carotid artery at the bulb to the cranial skull base has a smooth string sign. The vessel is contiguous with the petrous cavernous junction of the right internal carotid artery. Distally the supraclinoid segment is widely patent. Opacification is noted of the right middle cerebral artery and the right anterior cerebral artery into the  capillary and venous phases. Patchy leptomeningeal and left pial collaterals seen applying the cortical surface of the parasagittal regions of the right cerebral convexity. The right vertebral artery origin demonstrates severe pre occlusive stenosis. Antegrade flow is seen in the vertebral artery to the cranial skull base. Wide patency is seen of the right vertebrobasilar junction and the right posterior-inferior cerebellar artery. The opacified portion of the basilar artery, the posterior cerebral arteries, the superior cerebellar arteries and the anterior-inferior cerebellar arteries is grossly intact into the capillary and venous phases. Unopacified blood is seen in the basilar artery from the contralateral vertebral artery. There is retrograde opacification via a left posterior communicating artery of the left MCA distribution. Partial collateralization is also noted from leptomeningeal collaterals arising from the right posterior temporal branch of the right posterior cerebral artery. The left vertebral artery origin demonstrates severe pre occlusive stenosis. The vessel is seen to opacify to the cranial skull base. Patency is seen of the left posterior inferior cerebellar artery and the left vertebrobasilar junction. The basilar artery, the posterior cerebral arteries, the superior cerebellar arteries and the anterior-inferior cerebellar arteries opacify into the capillary and venous phases. Retrograde opacification via the left posterior communicating artery is seen of the left middle cerebral artery distribution. Also demonstrated are leptomeningeal collaterals arising from the right posterior cerebral artery posterior temporal branch supplying the cortical regions right temporal lobe and the right parietal lobe regions. The delayed arterial phase  subsequently also demonstrates retrograde opacification of the anterior cerebral arteries bilaterally with subsequent retrograde opacification into the right MCA  distribution cortical branches. The left common carotid arteriogram demonstrates nonvisualization of the left internal carotid artery at the bulb or at the cranial skull base. There is no evidence of a angiographic string sign on the delayed arterial phase. Partial retrograde opacification of the left vertebral artery at the level of C1 is seen via the left occipital artery. Intracranially there is partial reconstitution of the left internal carotid artery cavernous segment and supraclinoid segment. Minimal flow is noted into the left MCA distribution. These are via the prominent anastomosis via the artery of the foramen of rotundum, and of the foramen ovale. IMPRESSION: Angiographically occluded left internal carotid artery extra cranially with partial reconstitution from the external carotid artery branches as described above. Severe high-grade stenosis of the right internal carotid artery proximally with delayed angiographic string sign being contiguous with the proximal cavernous segment of the right internal carotid artery. Severe high-grade pre occlusive stenosis of both vertebral artery origins. PLAN: Findings were discussed with the patient and the referring stroke neurologist. Electronically Signed   By: [name] M.D.   On: 05/07/2019 17:24    Scheduled Meds: . aspirin  81 mg Oral Daily  . atorvastatin  80 mg Oral q1800  . fentaNYL      . heparin      . lidocaine      . nitroGLYCERIN      . ticagrelor  90 mg Oral BID  . verapamil       Continuous Infusions: . sodium chloride 100 mL/hr at 05/07/19 0034  . vancomycin      Active Problems:   Essential hypertension   CVA (cerebral vascular accident) [facility])   Internal carotid artery occlusion, bilateral   Hyperlipidemia      Desiree Hane  [facility]

## 2019-05-08 NOTE — Progress Notes (Signed)
Occupational Therapy Treatment Patient Details Name: Jobin Montelongo MRN: [id number] DOB: 08/10/1951 Today's Date: 05/08/2019    History of present illness Pt is a 68 y/o male who presented from [facility] ED with aphasia and confusion. MRI revealed old cortical and subcortical infarcts in both frontal and parietal regions, as well as areas of acute infarction in the L frontal and parietal lobes. PMH significant for PVC's, HTN, bilateral THR.   OT comments  Pt progressing toward established goals. Session focused on The Pillbox Test to assess medication management and cognitive functioning. Pt required 11minutes to complete test and made 9 errors, indicating a failing score >3 errors. Results from assessment indicate pt has limitations with executive functioning impacting his ability to safely and independently complete ADL/IADL tasks, particularly medication management. Discussed purpose of test and results of test with pt. Pt donned socks while sitting EOB with minguard assist. Pt will continue to benefit from skilled OT services to maximize safety and independence with ADL/IADL and functional mobility. Will continue to follow acutely.   Follow Up Recommendations  CIR    Equipment Recommendations  Other (comment)(defer to next venue)    Recommendations for Other Services      Precautions / Restrictions Precautions Precautions: Fall Restrictions Weight Bearing Restrictions: No       Mobility Bed Mobility Overal bed mobility: Needs Assistance Bed Mobility: Supine to Sit;Sit to Supine     Supine to sit: Supervision Sit to supine: Supervision   General bed mobility comments: increased time and effort  Transfers                 General transfer comment: did not assess this session    Balance Overall balance assessment: Needs assistance Sitting-balance support: Feet supported;No upper extremity supported Sitting balance-Leahy Scale: Good Sitting balance - Comments: no LOB with  donning socks                                   ADL either performed or assessed with clinical judgement   ADL                       Lower Body Dressing: Min guard Lower Body Dressing Details (indicate cue type and reason): pt donned socks while sitting EOB;minguard for stability                      Vision       Perception     Praxis      Cognition Arousal/Alertness: Awake/alert Behavior During Therapy: WFL for tasks assessed/performed Overall Cognitive Status: Impaired/Different from baseline Area of Impairment: Attention;Problem solving;Memory                   Current Attention Level: Selective Memory: Decreased short-term memory Following Commands: Follows multi-step commands inconsistently     Problem Solving: Slow processing;Difficulty sequencing General Comments: Pt completed The Pillbox Test making a total of 9 errors. Pt required 11 min to complete assessment that should be completed in 5 minutes. Pt able to correct errors when reviewing assessment and prompted to re-read label. Educated pt on purpose of assessment, results of the assessment and what they indicate. Pt expressed concern with results, reassured pt OT will continue to focus on cognition.         Exercises     Shoulder Instructions       General Comments  Pertinent Vitals/ Pain       Pain Assessment: No/denies pain  Home Living                                          Prior Functioning/Environment              Frequency  Min 3X/week        Progress Toward Goals  OT Goals(current goals can now be found in the care plan section)  Progress towards OT goals: Progressing toward goals  Acute Rehab OT Goals Patient Stated Goal: get to CIR OT Goal Formulation: With patient Time For Goal Achievement: 05/20/19 Potential to Achieve Goals: Good ADL Goals Pt Will Perform Grooming: with modified independence;standing Pt  Will Perform Lower Body Dressing: with modified independence;sit to/from stand Pt Will Transfer to Toilet: with modified independence;ambulating;regular height toilet Pt Will Perform Toileting - Clothing Manipulation and hygiene: with modified independence;sit to/from stand Pt Will Perform Tub/Shower Transfer: with modified independence;Shower transfer;ambulating Additional ADL Goal #1: Pt will identify and gather items necessary for ADL around his room modified independently.  Plan Discharge plan remains appropriate    Co-evaluation                 AM-PAC OT "6 Clicks" Daily Activity     Outcome Measure   Help from another person eating meals?: None Help from another person taking care of personal grooming?: A Little Help from another person toileting, which includes using toliet, bedpan, or urinal?: A Little Help from another person bathing (including washing, rinsing, drying)?: A Little Help from another person to put on and taking off regular upper body clothing?: A Little Help from another person to put on and taking off regular lower body clothing?: A Little 6 Click Score: 19    End of Session Equipment Utilized During Treatment: Gait belt  OT Visit Diagnosis: Unsteadiness on feet (R26.81);Other abnormalities of gait and mobility (R26.89);Apraxia (R48.2);Other symptoms and signs involving cognitive function   Activity Tolerance Patient tolerated treatment well   Patient Left in bed;with bed alarm set;with call bell/phone within reach   Nurse Communication Mobility status        Time: 1155-2080 OT Time Calculation (min): 44 min  Charges: OT General Charges $OT Visit: 1 Visit OT Treatments $Self Care/Home Management : 8-22 mins $Cognitive Funtion inital: Initial 15 mins $Cognitive Funtion additional: Additional15 mins  Dorinda Hill OTR/L Acute Rehabilitation Services Office: [place] 05/08/2019, 5:08 PM

## 2019-05-08 NOTE — Anesthesia Procedure Notes (Signed)
Procedure Name: MAC Date/Time: 05/08/2019 11:08 AM Performed by: Kathryne Hitch, CRNA Pre-anesthesia Checklist: Patient identified, Emergency Drugs available, Suction available and Patient being monitored Patient Re-evaluated:Patient Re-evaluated prior to induction Oxygen Delivery Method: Nasal cannula Preoxygenation: Pre-oxygenation with 100% oxygen Induction Type: IV induction Dental Injury: Teeth and Oropharynx as per pre-operative assessment

## 2019-05-08 NOTE — Progress Notes (Signed)
OT Cancellation Note  Patient Details Name: Jose Kaufman MRN: [id number] DOB: [birth year]   Cancelled Treatment:    Reason Eval/Treat Not Completed: Patient at procedure or test/ unavailable pt in surgery. OT  will return later when appropriate and if time allows.   Dorinda Hill OTR/L Acute Rehabilitation Services Office: [place] 05/08/2019, 10:14 AM

## 2019-05-08 NOTE — Progress Notes (Signed)
Inpatient Rehabilitation-Admissions Coordinator   Noted pt's procedure did not occur today and may not occur til next week. Will continue to follow pt's progress while in house to help determine most appropriate post acute therapy needs.  Please call if questions.   Jhonnie Garner, OTR/L  Rehab Admissions Coordinator  [phone] 05/08/2019 3:56 PM

## 2019-05-08 NOTE — Transfer of Care (Signed)
Immediate Anesthesia Transfer of Care Note  Patient: Jose Kaufman  Procedure(s) Performed: RADIOLOGY WITH ANESTHESIA (N/A )  Patient Location: Nursing Unit  Anesthesia Type:MAC  Level of Consciousness: awake and alert   Airway & Oxygen Therapy: Patient Spontanous Breathing and Patient connected to nasal cannula oxygen  Post-op Assessment: Report given to RN and Post -op Vital signs reviewed and stable  Post vital signs: Reviewed and stable  Last Vitals:  Vitals Value Taken Time  BP    Temp    Pulse    Resp    SpO2      Last Pain:  Vitals:   05/08/19 0800  TempSrc:   PainSc: 0-No pain         Complications: No apparent anesthesia complications

## 2019-05-08 NOTE — Sedation Documentation (Signed)
Patient under the care of anesthesia 

## 2019-05-08 NOTE — Progress Notes (Signed)
NIR.  Patient was scheduled for an image-guided cerebral arteriogram with possible embolization of proximal right ICA stenosis versus right VA stenosis today with Dr. Estanislado Pandy.  Unfortunately, an emergent code stroke procedure occurred today that required Dr. Arlean Hopping immediate attention at this time. Because of this, procedure will not occur today. Procedure will be tentatively scheduled for Tuesday 05/12/2019 pending scheduling. Will work on orders/consent once procedure officially scheduled. Restart patient diet. Anesthesia aware to come to 3W to remove arterial line.  Discussed above with patient. All questions answered and concerns addressed. Patient conveys understanding and agrees with plan.  Please call NIR with questions/concerns.   Bea Graff Louk, PA-C 05/08/2019, 1:17 PM

## 2019-05-08 NOTE — Progress Notes (Signed)
PT Cancellation Note  Patient Details Name: Jose Kaufman MRN: [id number] DOB: [birth year]   Cancelled Treatment:    Reason Eval/Treat Not Completed: Patient at procedure or test/unavailable. RN readying pt to leave unit for procedure. Will continue to follow and progress as able per POC.   Thelma Comp 05/08/2019, 8:35 AM   Rolinda Roan, PT, DPT Acute Rehabilitation Services Pager: [phone] Office: [phone]

## 2019-05-09 NOTE — Progress Notes (Signed)
Inpatient Rehabilitation-Admissions Coordinator   Received call from Dr. Lonny Prude regarding disposition to CIR. With planned procedure cancelled on Friday afternoon 5/29 and tentatively rescheduled for this coming Tuesday 6/2, insurance authorization process for CIR has not yet been initiated. We are not able to admit pt without insurance authorization. If surgery still tentatively planned for Tuesday, pt will need to have surgery completed prior to acceptance to rehab. Discussed that currently pt remains appropriate for CIR, but that we will need to reassess functional status after surgery to determine if pt remains appropriate for CIR. If pt remains in house, will follow up Monday.   Please call if questions.   Jhonnie Garner, OTR/L  Rehab Admissions Coordinator  [phone] 05/09/2019 5:47 PM

## 2019-05-09 NOTE — Progress Notes (Signed)
[facility] PROGRESS NOTE  [name]:[id number] DOB: 1951/03/05 DOA: 05/05/2019 PCP: Michel Harrow, PA-C  Assessment/Plan: 1. Watershed left hemispheric infarct in setting of dehydration hypotension.  Imaging shows near occlusive extracranial carotid stenosis, diagnostic evaluation with cerebral catheter angiogram confirms, awaiting IR  revascularization of right ICA stenosis on 5/29, IR would like patient to remain hospitalized until next available slot tentatively on 06/2 , hold lovenox, aspirin and plavix was discontinued for aspirin and brilinta per IR prior to procedure, Currently meets criteria for CIR per PT 2. Hypertension, SBP in 140s-160s.    D/c IVF. Monitor. Want to avoid hypotension given watershed infarcts. Previously holding home BP meds given normotensive 3. HLD, stable.  Started on Lipitor, LDL 113.  Code Status: Full code Family Communication: No family at bedside (indicate person spoken with, relationship, and if by phone, the number) Disposition Plan: Plan for attempt at R ICA revascularization but not until Tuesday, PT recommends CIR, will discuss with IR   Consultants:  Neurology  Procedures: Cerebral angiogram, 5/28: Findings. 1.Occluded left ICA prox. 2.Severe preocclusive stenosis of right ICA prox.  3. 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 year old male with medical history significant for HTN, HLD who presented on 05/05/2019 with slurred speech, difficulty with comprehension and word finding and lower extremity weakness that he noticed on 5/23 normal in the yard which he initially attributed to "heat stroke" and was found to have acute infarct of left frontoparietal brain as well as old cortical/subcortical infarcts.Marland Kitchen  "I want to go to rehab"   Objective: Vitals:   05/09/19 1634 05/09/19  2030  BP: (!) 164/85 (!) 153/90  Pulse:  77  Resp: 16 15  Temp:  98.1 F (36.7 C)  SpO2: 100% 100%    Intake/Output Summary (Last 24 hours) at 05/09/2019 2243 Last data filed at 05/09/2019 1800 Gross per 24 hour  Intake -  Output 920 ml  Net -920 ml   Filed Weights   05/05/19 0825 05/05/19 2144  Weight: 77.1 kg 76.1 kg    Exam:   General: Elderly male, no distress  Cardiovascular: Regular rate and rhythm, no edema  Respiratory: Normal respiratory effort on room air  Abdomen: Soft, nontender, normal bowel sounds  Musculoskeletal: Normal range of motion  Skin no rashes or lesions  Neurologic alert oriented x4, mild dysarthria, some hesitation/word finding difficulties strength intact bilaterally in all extremities  Data Reviewed: Basic Metabolic Panel: Recent Labs  Lab 05/05/19 0825 05/06/19 1132 05/07/19 0635 05/08/19 0459  NA 136 137 140 140  K 3.8 4.5 4.4 4.0  CL 103 104 105 109  CO2 26 23 27 23   GLUCOSE 103* 101* 98 107*  BUN 34* 28* 25* 21  CREATININE 1.56* 1.44* 1.35* 1.28*  CALCIUM 9.3 9.3 9.1 8.7*  MG 2.2  --   --   --    Liver Function Tests: Recent Labs  Lab 05/05/19 0825  AST 19  ALT 20  ALKPHOS 74  BILITOT 0.8  PROT 8.0  ALBUMIN 4.7   No results for input(s): LIPASE, AMYLASE in the last 168 hours. No results for input(s): AMMONIA in the last 168 hours. CBC: Recent Labs  Lab 05/05/19 0825 05/08/19 0459  WBC 8.6 6.8  NEUTROABS 6.8 4.9  HGB 15.4 13.7  HCT 46.4 40.1  MCV 95.1 91.6  PLT 216  208   Cardiac Enzymes: Recent Labs  Lab 05/06/19 1132  CKTOTAL 151   BNP (last 3 results) No results for input(s): BNP in the last 8760 hours.  ProBNP (last 3 results) No results for input(s): PROBNP in the last 8760 hours.  CBG: No results for input(s): GLUCAP in the last 168 hours.  Recent Results (from the past 240 hour(s))  SARS Coronavirus 2 (CEPHEID - Performed in [place] hospital lab), Hosp Order     Status: None    Collection Time: 05/05/19  8:59 AM  Result Value Ref Range Status   SARS Coronavirus 2 NEGATIVE NEGATIVE Final    Comment: (NOTE) If result is NEGATIVE SARS-CoV-2 target nucleic acids are NOT DETECTED. The SARS-CoV-2 RNA is generally detectable in upper and lower  respiratory specimens during the acute phase of infection. The lowest  concentration of SARS-CoV-2 viral copies this assay can detect is 250  copies / mL. A negative result does not preclude SARS-CoV-2 infection  and should not be used as the sole basis for treatment or other  patient management decisions.  A negative result may occur with  improper specimen collection / handling, submission of specimen other  than nasopharyngeal swab, presence of viral mutation(s) within the  areas targeted by this assay, and inadequate number of viral copies  (<250 copies / mL). A negative result must be combined with clinical  observations, patient history, and epidemiological information. If result is POSITIVE SARS-CoV-2 target nucleic acids are DETECTED. The SARS-CoV-2 RNA is generally detectable in upper and lower  respiratory specimens dur ing the acute phase of infection.  Positive  results are indicative of active infection with SARS-CoV-2.  Clinical  correlation with patient history and other diagnostic information is  necessary to determine patient infection status.  Positive results do  not rule out bacterial infection or co-infection with other viruses. If result is PRESUMPTIVE POSTIVE SARS-CoV-2 nucleic acids MAY BE PRESENT.   A presumptive positive result was obtained on the submitted specimen  and confirmed on repeat testing.  While 2019 novel coronavirus  (SARS-CoV-2) nucleic acids may be present in the submitted sample  additional confirmatory testing may be necessary for epidemiological  and / or clinical management purposes  to differentiate between  SARS-CoV-2 and other Sarbecovirus currently known to infect humans.   If clinically indicated additional testing with an alternate test  methodology [phone]) is advised. The SARS-CoV-2 RNA is generally  detectable in upper and lower respiratory sp ecimens during the acute  phase of infection. The expected result is Negative. Fact Sheet for Patients:  StrictlyIdeas.no Fact Sheet for Healthcare Providers: BankingDealers.co.za This test is not yet approved or cleared by the [place] FDA and has been authorized for detection and/or diagnosis of SARS-CoV-2 by FDA under an Emergency Use Authorization (EUA).  This EUA will remain in effect (meaning this test can be used) for the duration of the COVID-19 declaration under Section 564(b)(1) of the Act, 21 U.S.C. section 360bbb-3(b)(1), unless the authorization is terminated or revoked sooner. Performed at [facility], [place] [address], [place], [place] [address]   SARS Coronavirus 2 (CEPHEID - Performed in [place] hospital lab), Hosp Order     Status: None   Collection Time: 05/08/19  8:24 AM  Result Value Ref Range Status   SARS Coronavirus 2 NEGATIVE NEGATIVE Final    Comment: (NOTE) If result is NEGATIVE SARS-CoV-2 target nucleic acids are NOT DETECTED. The SARS-CoV-2 RNA is generally detectable in upper and  lower  respiratory specimens during the acute phase of infection. The lowest  concentration of SARS-CoV-2 viral copies this assay can detect is 250  copies / mL. A negative result does not preclude SARS-CoV-2 infection  and should not be used as the sole basis for treatment or other  patient management decisions.  A negative result may occur with  improper specimen collection / handling, submission of specimen other  than nasopharyngeal swab, presence of viral mutation(s) within the  areas targeted by this assay, and inadequate number of viral copies  (<250 copies / mL). A negative result must be combined with clinical   observations, patient history, and epidemiological information. If result is POSITIVE SARS-CoV-2 target nucleic acids are DETECTED. The SARS-CoV-2 RNA is generally detectable in upper and lower  respiratory specimens dur ing the acute phase of infection.  Positive  results are indicative of active infection with SARS-CoV-2.  Clinical  correlation with patient history and other diagnostic information is  necessary to determine patient infection status.  Positive results do  not rule out bacterial infection or co-infection with other viruses. If result is PRESUMPTIVE POSTIVE SARS-CoV-2 nucleic acids MAY BE PRESENT.   A presumptive positive result was obtained on the submitted specimen  and confirmed on repeat testing.  While 2019 novel coronavirus  (SARS-CoV-2) nucleic acids may be present in the submitted sample  additional confirmatory testing may be necessary for epidemiological  and / or clinical management purposes  to differentiate between  SARS-CoV-2 and other Sarbecovirus currently known to infect humans.  If clinically indicated additional testing with an alternate test  methodology [phone]) is advised. The SARS-CoV-2 RNA is generally  detectable in upper and lower respiratory sp ecimens during the acute  phase of infection. The expected result is Negative. Fact Sheet for Patients:  StrictlyIdeas.no Fact Sheet for Healthcare Providers: BankingDealers.co.za This test is not yet approved or cleared by the [place] FDA and has been authorized for detection and/or diagnosis of SARS-CoV-2 by FDA under an Emergency Use Authorization (EUA).  This EUA will remain in effect (meaning this test can be used) for the duration of the COVID-19 declaration under Section 564(b)(1) of the Act, 21 U.S.C. section 360bbb-3(b)(1), unless the authorization is terminated or revoked sooner. Performed at [facility], [place] [address],  [place],  [address]      Studies: No results found.  Scheduled Meds: . aspirin  81 mg Oral Daily  . atorvastatin  80 mg Oral q1800  . ticagrelor  90 mg Oral BID   Continuous Infusions: . sodium chloride 100 mL/hr at 05/07/19 5498    Active Problems:   Essential hypertension   CVA (cerebral vascular accident) [facility])   Internal carotid artery occlusion, bilateral   Hyperlipidemia      Desiree Hane  [facility]

## 2019-05-09 NOTE — Progress Notes (Signed)
Physical Therapy Treatment Patient Details Name: Jose Kaufman MRN: [id number] DOB: January 21, 1951 Today's Date: 05/09/2019    History of Present Illness Pt is a 68 y/o male who presented from [facility] ED with aphasia and confusion. MRI revealed old cortical and subcortical infarcts in both frontal and parietal regions, as well as areas of acute infarction in the L frontal and parietal lobes. PMH significant for PVC's, HTN, bilateral THR.    PT Comments    Patient increased gait distance and decreased the amount of right lateral movement with gait. He continues to require something to hold onto. Given his high baseline mobility and his deficits at this time CIR remains appropriate for the patient. Therapy will continue to work with patient in the hospital to increase mobility and safety.   Follow Up Recommendations  CIR;Supervision/Assistance - 24 hour     Equipment Recommendations  None recommended by PT    Recommendations for Other Services Rehab consult     Precautions / Restrictions Precautions Precautions: Fall Restrictions Weight Bearing Restrictions: No    Mobility  Bed Mobility Overal bed mobility: Needs Assistance Bed Mobility: Supine to Sit;Sit to Supine     Supine to sit: Supervision Sit to supine: Supervision   General bed mobility comments: continues to require increased time and effort  Transfers Overall transfer level: Needs assistance Equipment used: 1 person hand held assist   Sit to Stand: Min guard         General transfer comment: mi  gaurd for balance. Uses the pole to steady himself. may benefit from cane   Ambulation/Gait Ambulation/Gait assistance: Min assist Gait Distance (Feet): 200 Feet   Gait Pattern/deviations: Step-through pattern;Decreased stride length;Trunk flexed;Drifts right/left;Narrow base of support     General Gait Details: continues to take short steps and have a lateral drift to the right although improved. Fatigued with  gait   Stairs             Wheelchair Mobility    Modified Rankin (Stroke Patients Only) Modified Rankin (Stroke Patients Only) Pre-Morbid Rankin Score: No symptoms Modified Rankin: Moderately severe disability     Balance Overall balance assessment: Needs assistance Sitting-balance support: Feet supported;No upper extremity supported Sitting balance-Leahy Scale: Good     Standing balance support: No upper extremity supported;During functional activity Standing balance-Leahy Scale: Fair Standing balance comment: uses the IV pole for balance                             Cognition Arousal/Alertness: Awake/alert Behavior During Therapy: WFL for tasks assessed/performed Overall Cognitive Status: Impaired/Different from baseline Area of Impairment: Attention;Problem solving;Memory                   Current Attention Level: Selective Memory: Decreased short-term memory Following Commands: Follows multi-step commands inconsistently     Problem Solving: Slow processing;Difficulty sequencing General Comments: Pt completed The Pillbox Test making a total of 9 errors. Pt required 11 min to complete assessment that should be completed in 5 minutes. Pt able to correct errors when reviewing assessment and prompted to re-read label. Educated pt on purpose of assessment, results of the assessment and what they indicate. Pt expressed concern with results, reassured pt OT will continue to focus on cognition.       Exercises      General Comments        Pertinent Vitals/Pain Pain Assessment: No/denies pain    Home Living  Prior Function            PT Goals (current goals can now be found in the care plan section) Acute Rehab PT Goals Patient Stated Goal: get to CIR PT Goal Formulation: With patient Time For Goal Achievement: 05/20/19 Potential to Achieve Goals: Good Progress towards PT goals: Progressing toward goals     Frequency    Min 4X/week      PT Plan Current plan remains appropriate    Co-evaluation              AM-PAC PT "6 Clicks" Mobility   Outcome Measure  Help needed turning from your back to your side while in a flat bed without using bedrails?: A Little Help needed moving from lying on your back to sitting on the side of a flat bed without using bedrails?: A Little Help needed moving to and from a bed to a chair (including a wheelchair)?: A Little Help needed standing up from a chair using your arms (e.g., wheelchair or bedside chair)?: A Little Help needed to walk in hospital room?: A Little Help needed climbing 3-5 steps with a railing? : A Little 6 Click Score: 18    End of Session Equipment Utilized During Treatment: Gait belt Activity Tolerance: Patient tolerated treatment well Patient left: in chair;with call bell/phone within reach;Other (comment) Nurse Communication: Mobility status PT Visit Diagnosis: Other abnormalities of gait and mobility (R26.89);Other symptoms and signs involving the nervous system (R29.898);Apraxia (R48.2)     Time: 1210-1226 PT Time Calculation (min) (ACUTE ONLY): 16 min  Charges:  $Gait Training: 8-22 mins                        Carney Living PT DPT  05/09/2019, 1:39 PM

## 2019-05-10 ENCOUNTER — Encounter (HOSPITAL_COMMUNITY): Payer: Self-pay

## 2019-05-10 NOTE — Progress Notes (Signed)
Physical Therapy Treatment Patient Details Name: Jose Kaufman MRN: [id number] DOB: May 19, 1951 Today's Date: 05/10/2019    History of Present Illness Pt is a 68 y/o male who presented from [facility] ED with aphasia and confusion. MRI revealed old cortical and subcortical infarcts in both frontal and parietal regions, as well as areas of acute infarction in the L frontal and parietal lobes. PMH significant for PVC's, HTN, bilateral THR.    PT Comments    Patient doing well with therapy today, ambulating without AD with min A at times for stability. Cues for mechanics, intermittent correction during session. Cont to rec post acute rehab.    Follow Up Recommendations  CIR;Supervision/Assistance - 24 hour     Equipment Recommendations  None recommended by PT    Recommendations for Other Services Rehab consult     Precautions / Restrictions Precautions Precautions: Fall Restrictions Weight Bearing Restrictions: No    Mobility  Bed Mobility Overal bed mobility: Needs Assistance Bed Mobility: Supine to Sit;Sit to Supine     Supine to sit: Supervision Sit to supine: Supervision   General bed mobility comments: continues to require increased time and effort  Transfers Overall transfer level: Needs assistance Equipment used: 1 person hand held assist   Sit to Stand: Min guard         General transfer comment: mi  gaurd for balance. Uses the pole to steady himself. may benefit from cane   Ambulation/Gait Ambulation/Gait assistance: Min assist Gait Distance (Feet): 200 Feet Assistive device: None Gait Pattern/deviations: Step-through pattern;Decreased stride length;Trunk flexed;Drifts right/left;Narrow base of support Gait velocity: decreased   General Gait Details: short narrow steps, cues to widen BOS and promote heel strike, intermittent cary over during visit.    Stairs             Wheelchair Mobility    Modified Rankin (Stroke Patients Only) Modified Rankin  (Stroke Patients Only) Pre-Morbid Rankin Score: No symptoms Modified Rankin: Moderately severe disability     Balance Overall balance assessment: Needs assistance Sitting-balance support: Feet supported;No upper extremity supported Sitting balance-Leahy Scale: Good     Standing balance support: No upper extremity supported;During functional activity Standing balance-Leahy Scale: Fair Standing balance comment: uses the IV pole for balance                             Cognition Arousal/Alertness: Awake/alert Behavior During Therapy: WFL for tasks assessed/performed Overall Cognitive Status: Impaired/Different from baseline Area of Impairment: Attention;Problem solving;Memory                   Current Attention Level: Selective Memory: Decreased short-term memory Following Commands: Follows multi-step commands inconsistently     Problem Solving: Slow processing;Difficulty sequencing General Comments: Pt completed The Pillbox Test making a total of 9 errors. Pt required 11 min to complete assessment that should be completed in 5 minutes. Pt able to correct errors when reviewing assessment and prompted to re-read label. Educated pt on purpose of assessment, results of the assessment and what they indicate. Pt expressed concern with results, reassured pt OT will continue to focus on cognition.       Exercises      General Comments        Pertinent Vitals/Pain      Home Living                      Prior Function  PT Goals (current goals can now be found in the care plan section) Acute Rehab PT Goals Patient Stated Goal: get to CIR PT Goal Formulation: With patient Time For Goal Achievement: 05/20/19 Potential to Achieve Goals: Good    Frequency    Min 4X/week      PT Plan Current plan remains appropriate    Co-evaluation              AM-PAC PT "6 Clicks" Mobility   Outcome Measure  Help needed turning from your  back to your side while in a flat bed without using bedrails?: A Little Help needed moving from lying on your back to sitting on the side of a flat bed without using bedrails?: A Little Help needed moving to and from a bed to a chair (including a wheelchair)?: A Little Help needed standing up from a chair using your arms (e.g., wheelchair or bedside chair)?: A Little Help needed to walk in hospital room?: A Little Help needed climbing 3-5 steps with a railing? : A Little 6 Click Score: 18    End of Session Equipment Utilized During Treatment: Gait belt Activity Tolerance: Patient tolerated treatment well Patient left: in chair;with call bell/phone within reach;Other (comment) Nurse Communication: Mobility status PT Visit Diagnosis: Other abnormalities of gait and mobility (R26.89);Other symptoms and signs involving the nervous system (R29.898);Apraxia (R48.2)     Time: 1520-1540 PT Time Calculation (min) (ACUTE ONLY): 20 min  Charges:  $Gait Training: 8-22 mins                     Reinaldo Berber, PT, DPT Acute Rehabilitation Services Pager: [phone] Office: [phone]     Reinaldo Berber 05/10/2019, 3:51 PM

## 2019-05-10 NOTE — Progress Notes (Signed)
[facility] PROGRESS NOTE  Jose Kaufman QPR:[id number] DOB: [birth year] DOA: 05/05/2019 PCP: Michel Harrow, PA-C  Assessment/Plan: 1. Watershed left hemispheric infarct in setting of dehydration hypotension.  Imaging shows near occlusive extracranial carotid stenosis, diagnostic evaluation with cerebral catheter angiogram confirms, awaiting IR  revascularization of right ICA stenosis on 5/29, IR would like patient to remain hospitalized until next available slot tentatively on 06/2 , hold lovenox, aspirin and plavix was discontinued for aspirin and brilinta per IR prior to procedure, Currently meets criteria for CIR per PT eval. Will discuss case with radiology on 6/1 to see if procedure likely to happen 2. Hypertension, SBP in 140s-160s.    D/c IVF. Monitor. Want to avoid hypotension given watershed infarcts. Previously holding home BP meds given normotensive 3. HLD, stable.  Started on Lipitor, LDL 113.  Code Status: Full code Family Communication: No family at bedside (indicate person spoken with, relationship, and if by phone, the number) Disposition Plan: Plan for attempt at R ICA revascularization but not until Tuesday, PT recommends CIR, will discuss with IR on 6/1   Consultants:  Neurology  Procedures: Cerebral angiogram, 5/28: Findings. 1.Occluded left ICA prox. 2.Severe preocclusive stenosis of right ICA prox.  3. 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 year old male with medical history significant for HTN, HLD who presented on 05/05/2019 with slurred speech, difficulty with comprehension and word finding and lower extremity weakness that he noticed on 5/23 normal in the yard which he initially attributed to "heat stroke" and was found to have acute infarct of left frontoparietal brain as well as old cortical/subcortical  infarcts.Marland Kitchen  "I feel fine. Had a great night" No CP, no DOE   Objective: Vitals:   05/10/19 1553 05/10/19 2022  BP: (!) 141/94 (!) 145/90  Pulse: 75 75  Resp: 16   Temp: 98.4 F (36.9 C) 98.1 F (36.7 C)  SpO2: 99% 96%    Intake/Output Summary (Last 24 hours) at 05/10/2019 2030 Last data filed at 05/10/2019 1800 Gross per 24 hour  Intake -  Output 1400 ml  Net -1400 ml   Filed Weights   05/05/19 0825 05/05/19 2144  Weight: 77.1 kg 76.1 kg    Exam:   General: Elderly male, no distress  Cardiovascular: Regular rate and rhythm, no edema  Respiratory: Normal respiratory effort on room air  Abdomen: Soft, nontender, normal bowel sounds  Musculoskeletal: Normal range of motion  Skin no rashes or lesions  Neurologic alert oriented x4, mild dysarthria, some hesitation/word finding difficulties strength intact bilaterally in all extremities  Data Reviewed: Basic Metabolic Panel: Recent Labs  Lab 05/05/19 0825 05/06/19 1132 05/07/19 0635 05/08/19 0459  NA 136 137 140 140  K 3.8 4.5 4.4 4.0  CL 103 104 105 109  CO2 26 23 27 23   GLUCOSE 103* 101* 98 107*  BUN 34* 28* 25* 21  CREATININE 1.56* 1.44* 1.35* 1.28*  CALCIUM 9.3 9.3 9.1 8.7*  MG 2.2  --   --   --    Liver Function Tests: Recent Labs  Lab 05/05/19 0825  AST 19  ALT 20  ALKPHOS 74  BILITOT 0.8  PROT 8.0  ALBUMIN 4.7   No results for input(s): LIPASE, AMYLASE in the last 168 hours. No results for input(s): AMMONIA in the last 168 hours. CBC: Recent Labs  Lab 05/05/19 0825  05/08/19 0459  WBC 8.6 6.8  NEUTROABS 6.8 4.9  HGB 15.4 13.7  HCT 46.4 40.1  MCV 95.1 91.6  PLT 216 208   Cardiac Enzymes: Recent Labs  Lab 05/06/19 1132  CKTOTAL 151   BNP (last 3 results) No results for input(s): BNP in the last 8760 hours.  ProBNP (last 3 results) No results for input(s): PROBNP in the last 8760 hours.  CBG: No results for input(s): GLUCAP in the last 168 hours.  Recent Results (from  the past 240 hour(s))  SARS Coronavirus 2 (CEPHEID - Performed in [place] hospital lab), Hosp Order     Status: None   Collection Time: 05/05/19  8:59 AM  Result Value Ref Range Status   SARS Coronavirus 2 NEGATIVE NEGATIVE Final    Comment: (NOTE) If result is NEGATIVE SARS-CoV-2 target nucleic acids are NOT DETECTED. The SARS-CoV-2 RNA is generally detectable in upper and lower  respiratory specimens during the acute phase of infection. The lowest  concentration of SARS-CoV-2 viral copies this assay can detect is 250  copies / mL. A negative result does not preclude SARS-CoV-2 infection  and should not be used as the sole basis for treatment or other  patient management decisions.  A negative result may occur with  improper specimen collection / handling, submission of specimen other  than nasopharyngeal swab, presence of viral mutation(s) within the  areas targeted by this assay, and inadequate number of viral copies  (<250 copies / mL). A negative result must be combined with clinical  observations, patient history, and epidemiological information. If result is POSITIVE SARS-CoV-2 target nucleic acids are DETECTED. The SARS-CoV-2 RNA is generally detectable in upper and lower  respiratory specimens dur ing the acute phase of infection.  Positive  results are indicative of active infection with SARS-CoV-2.  Clinical  correlation with patient history and other diagnostic information is  necessary to determine patient infection status.  Positive results do  not rule out bacterial infection or co-infection with other viruses. If result is PRESUMPTIVE POSTIVE SARS-CoV-2 nucleic acids MAY BE PRESENT.   A presumptive positive result was obtained on the submitted specimen  and confirmed on repeat testing.  While 2019 novel coronavirus  (SARS-CoV-2) nucleic acids may be present in the submitted sample  additional confirmatory testing may be necessary for epidemiological  and / or  clinical management purposes  to differentiate between  SARS-CoV-2 and other Sarbecovirus currently known to infect humans.  If clinically indicated additional testing with an alternate test  methodology [phone]) is advised. The SARS-CoV-2 RNA is generally  detectable in upper and lower respiratory sp ecimens during the acute  phase of infection. The expected result is Negative. Fact Sheet for Patients:  StrictlyIdeas.no Fact Sheet for Healthcare Providers: BankingDealers.co.za This test is not yet approved or cleared by the [place] FDA and has been authorized for detection and/or diagnosis of SARS-CoV-2 by FDA under an Emergency Use Authorization (EUA).  This EUA will remain in effect (meaning this test can be used) for the duration of the COVID-19 declaration under Section 564(b)(1) of the Act, 21 U.S.C. section 360bbb-3(b)(1), unless the authorization is terminated or revoked sooner. Performed at [facility], [place] [address], [place], [place] [address]   SARS Coronavirus 2 (CEPHEID - Performed in [facility] hospital lab), Hosp Order     Status: None   Collection Time: 05/08/19  8:24 AM  Result Value Ref Range Status   SARS Coronavirus 2 NEGATIVE NEGATIVE Final  Comment: (NOTE) If result is NEGATIVE SARS-CoV-2 target nucleic acids are NOT DETECTED. The SARS-CoV-2 RNA is generally detectable in upper and lower  respiratory specimens during the acute phase of infection. The lowest  concentration of SARS-CoV-2 viral copies this assay can detect is 250  copies / mL. A negative result does not preclude SARS-CoV-2 infection  and should not be used as the sole basis for treatment or other  patient management decisions.  A negative result may occur with  improper specimen collection / handling, submission of specimen other  than nasopharyngeal swab, presence of viral mutation(s) within the  areas targeted by this  assay, and inadequate number of viral copies  (<250 copies / mL). A negative result must be combined with clinical  observations, patient history, and epidemiological information. If result is POSITIVE SARS-CoV-2 target nucleic acids are DETECTED. The SARS-CoV-2 RNA is generally detectable in upper and lower  respiratory specimens dur ing the acute phase of infection.  Positive  results are indicative of active infection with SARS-CoV-2.  Clinical  correlation with patient history and other diagnostic information is  necessary to determine patient infection status.  Positive results do  not rule out bacterial infection or co-infection with other viruses. If result is PRESUMPTIVE POSTIVE SARS-CoV-2 nucleic acids MAY BE PRESENT.   A presumptive positive result was obtained on the submitted specimen  and confirmed on repeat testing.  While 2019 novel coronavirus  (SARS-CoV-2) nucleic acids may be present in the submitted sample  additional confirmatory testing may be necessary for epidemiological  and / or clinical management purposes  to differentiate between  SARS-CoV-2 and other Sarbecovirus currently known to infect humans.  If clinically indicated additional testing with an alternate test  methodology [phone]) is advised. The SARS-CoV-2 RNA is generally  detectable in upper and lower respiratory sp ecimens during the acute  phase of infection. The expected result is Negative. Fact Sheet for Patients:  StrictlyIdeas.no Fact Sheet for Healthcare Providers: BankingDealers.co.za This test is not yet approved or cleared by the [place] FDA and has been authorized for detection and/or diagnosis of SARS-CoV-2 by FDA under an Emergency Use Authorization (EUA).  This EUA will remain in effect (meaning this test can be used) for the duration of the COVID-19 declaration under Section 564(b)(1) of the Act, 21 U.S.C. section 360bbb-3(b)(1),  unless the authorization is terminated or revoked sooner. Performed at [facility], [place] [address], [place], [place] [address]      Studies: No results found.  Scheduled Meds: . aspirin  81 mg Oral Daily  . atorvastatin  80 mg Oral q1800  . ticagrelor  90 mg Oral BID   Continuous Infusions:   Active Problems:   Essential hypertension   CVA (cerebral vascular accident) [facility])   Internal carotid artery occlusion, bilateral   Hyperlipidemia      Desiree Hane  [facility]

## 2019-05-11 ENCOUNTER — Other Ambulatory Visit: Payer: Self-pay | Admitting: Student

## 2019-05-11 ENCOUNTER — Encounter (HOSPITAL_COMMUNITY): Payer: Self-pay | Admitting: Interventional Radiology

## 2019-05-11 DIAGNOSIS — I771 Stricture of artery: Secondary | ICD-10-CM

## 2019-05-11 LAB — URINALYSIS, COMPLETE (UACMP) WITH MICROSCOPIC
Bacteria, UA: NONE SEEN
Bilirubin Urine: NEGATIVE
Glucose, UA: NEGATIVE mg/dL
Ketones, ur: NEGATIVE mg/dL
Leukocytes,Ua: NEGATIVE
Nitrite: NEGATIVE
Protein, ur: NEGATIVE mg/dL
Specific Gravity, Urine: 1.019 (ref 1.005–1.030)
pH: 5 (ref 5.0–8.0)

## 2019-05-11 LAB — SARS CORONAVIRUS 2 BY RT PCR (HOSPITAL ORDER, PERFORMED IN ~~LOC~~ HOSPITAL LAB): SARS Coronavirus 2: NEGATIVE

## 2019-05-11 NOTE — Anesthesia Preprocedure Evaluation (Addendum)
Anesthesia Evaluation  Patient identified by MRN, date of birth, ID band Patient awake    Reviewed: Allergy & Precautions, NPO status , Patient's Chart, lab work & pertinent test results  Airway Mallampati: IV  TM Distance: >3 FB Neck ROM: Full    Dental no notable dental hx.    Pulmonary former smoker,    Pulmonary exam normal breath sounds clear to auscultation       Cardiovascular hypertension, Pt. on medications Normal cardiovascular exam Rhythm:Regular Rate:Normal  ECG: SR, rate 80   Neuro/Psych CVA, No Residual Symptoms    GI/Hepatic negative GI ROS, Neg liver ROS,   Endo/Other  negative endocrine ROS  Renal/GU negative Renal ROS     Musculoskeletal negative musculoskeletal ROS (+)   Abdominal   Peds  Hematology HLD   Anesthesia Other Findings stroke  Reproductive/Obstetrics                            Anesthesia Physical Anesthesia Plan  ASA: III  Anesthesia Plan: General   Post-op Pain Management:    Induction: Intravenous  PONV Risk Score and Plan: 2 and Ondansetron, Dexamethasone and Treatment may vary due to age or medical condition  Airway Management Planned: Oral ETT  Additional Equipment: Arterial line  Intra-op Plan:   Post-operative Plan: Extubation in OR  Informed Consent: I have reviewed the patients History and Physical, chart, labs and discussed the procedure including the risks, benefits and alternatives for the proposed anesthesia with the patient or authorized representative who has indicated his/her understanding and acceptance.     Dental advisory given  Plan Discussed with: CRNA  Anesthesia Plan Comments:      Anesthesia Quick Evaluation

## 2019-05-11 NOTE — Progress Notes (Signed)
Physical Therapy Treatment Patient Details Name: Jose Kaufman MRN: [id number] DOB: 06-12-1951 Today's Date: 05/11/2019    History of Present Illness Pt is a 68 y/o male who presented from [facility] ED with aphasia and confusion. MRI revealed old cortical and subcortical infarcts in both frontal and parietal regions, as well as areas of acute infarction in the L frontal and parietal lobes. PMH significant for PVC's, HTN, bilateral THR.    PT Comments    Patient is making good progress. His gait is improving. He tolerated resisted there-ex well. He reports he feels like his biggest deficits are in his speech. He is very motivated to continue to work with therapy and improve. CIR remains appropriate.    Follow Up Recommendations  CIR;Supervision/Assistance - 24 hour     Equipment Recommendations  None recommended by PT    Recommendations for Other Services Rehab consult     Precautions / Restrictions Restrictions Weight Bearing Restrictions: No    Mobility  Bed Mobility Overal bed mobility: Needs Assistance Bed Mobility: Supine to Sit;Sit to Supine     Supine to sit: Supervision Sit to supine: Supervision   General bed mobility comments: continues to require increased time and effort  Transfers Overall transfer level: Needs assistance Equipment used: 1 person hand held assist Transfers: Sit to/from Stand Sit to Stand: Min guard         General transfer comment: continues to require min gaurd for inital balance   Ambulation/Gait Ambulation/Gait assistance: Min assist Gait Distance (Feet): 200 Feet Assistive device: None Gait Pattern/deviations: Step-through pattern;Decreased stride length;Trunk flexed;Drifts right/left;Narrow base of support     General Gait Details: continues to require min a balance and to stay straight with ambualtion although making steady improvement    Stairs             Wheelchair Mobility    Modified Rankin (Stroke Patients  Only) Modified Rankin (Stroke Patients Only) Pre-Morbid Rankin Score: No symptoms Modified Rankin: Moderately severe disability     Balance Overall balance assessment: Needs assistance Sitting-balance support: Feet supported;No upper extremity supported Sitting balance-Leahy Scale: Good     Standing balance support: No upper extremity supported;During functional activity Standing balance-Leahy Scale: Fair Standing balance comment: needs some assist for balance                             Cognition Arousal/Alertness: Awake/alert Behavior During Therapy: WFL for tasks assessed/performed Overall Cognitive Status: Impaired/Different from baseline Area of Impairment: Attention;Problem solving;Memory                   Current Attention Level: Selective           General Comments: followed all commoands without difficulty       Exercises General Exercises - Lower Extremity Long Arc Quad: 20 reps(oarnge band ) Heel Slides: 20 reps(yellow band ) Hip ABduction/ADduction: 20 reps(yellow band)    General Comments        Pertinent Vitals/Pain Pain Assessment: No/denies pain    Home Living                      Prior Function            PT Goals (current goals can now be found in the care plan section) Acute Rehab PT Goals Patient Stated Goal: get to CIR PT Goal Formulation: With patient Time For Goal Achievement: 05/20/19 Potential to Achieve Goals: Good Progress  towards PT goals: Progressing toward goals    Frequency    Min 4X/week      PT Plan Current plan remains appropriate    Co-evaluation              AM-PAC PT "6 Clicks" Mobility   Outcome Measure  Help needed turning from your back to your side while in a flat bed without using bedrails?: A Little Help needed moving from lying on your back to sitting on the side of a flat bed without using bedrails?: A Little Help needed moving to and from a bed to a chair  (including a wheelchair)?: A Little Help needed standing up from a chair using your arms (e.g., wheelchair or bedside chair)?: A Little Help needed to walk in hospital room?: A Little Help needed climbing 3-5 steps with a railing? : A Little 6 Click Score: 18    End of Session Equipment Utilized During Treatment: Gait belt Activity Tolerance: Patient tolerated treatment well Patient left: in chair;with call bell/phone within reach;Other (comment) Nurse Communication: Mobility status PT Visit Diagnosis: Other abnormalities of gait and mobility (R26.89);Other symptoms and signs involving the nervous system (R29.898);Apraxia (R48.2)     Time: 8527-7824 PT Time Calculation (min) (ACUTE ONLY): 20 min  Charges:  $Gait Training: 8-22 mins                       Carney Living PT DPT  05/11/2019, 12:21 PM

## 2019-05-11 NOTE — Progress Notes (Signed)
Chief Complaint: Patient was seen in consultation today for right ICA and right VA stenosis/revascularization.  Referring Physician(s): Forde Dandy  Supervising Physician: [name]  Patient Status: [facility] - In-pt  History of Present Illness: Jose Kaufman is a 68 y.o. male with a past medical history of hypertension, hyperlipidemia, PVCs, CVA 04/2019, and arthritis. He presented to [facility] 05/05/2019 with complaints of AMS/confusion, gait instability, and difficulty with word finding. In ED, CT head unremarkable and MR brain revealed areas of acute infarction in the left frontal and parietal brain. Neurology was consulted who recommended transfer to [facility] for admission and further management. CTA neck was ordered and revealed bilateral ICA stenosis (possible left ICA occlusion, right ICA string sign). Based on these findings, neurology recommends NIR consultation for management. He underwent an image-guided diagnostic cerebral arteriogram 05/06/2019 by Dr. Estanislado Pandy, findings below. At that time, it was recommended that patient undergo endovascular revascularization of his right ICA stenosis verses right VA stenosis. This procedure was scheduled tentatively for 05/08/2019, but unfortunately an emergent code stroke procedure occurred today that required Dr. Arlean Hopping immediate attention at that time, and procedure did not occur. Procedure has been tentatively rescheduled for 05/12/2019.  Diagnostic cerebral arteriogram 05/06/2019: 1. Occluded left ICA prox. 2. Severe preocclusive stenosis of right ICA prox. 3. Severe preocclusive stenosis of bilateral vertebral arteries at the origins.  Patient awake and alert sitting in chair eating lunch. Complains of slurred speech, slightly improved since admission. Denies fever, chills, chest pain, dyspnea, abdominal pain, or headache.  Patient is currently taking Brilinta 90 mg twice daily and Aspirin 81 mg once daily.   Past Medical History:   Diagnosis Date   Abnormality of gait 10/15/2016   Arthritis    Dysrhythmia    hx of   Essential hypertension 03/14/2017   PVCs (premature ventricular contractions) 03/14/2017    Past Surgical History:  Procedure Laterality Date   IR ANGIO INTRA EXTRACRAN SEL COM CAROTID INNOMINATE BILAT MOD SED  05/07/2019   IR ANGIO VERTEBRAL SEL SUBCLAVIAN INNOMINATE BILAT MOD SED  05/07/2019   TOTAL HIP ARTHROPLASTY Right 03/22/2017   Procedure: RIGHT TOTAL HIP ARTHROPLASTY ANTERIOR APPROACH;  Surgeon: Mcarthur Rossetti, MD;  Location: [facility] ORS;  Service: Orthopedics;  Laterality: Right;   TOTAL HIP ARTHROPLASTY     Left 08/23/17 Dr. Zollie Beckers   TOTAL HIP ARTHROPLASTY Left 08/23/2017   Procedure: LEFT TOTAL HIP ARTHROPLASTY ANTERIOR APPROACH;  Surgeon: Mcarthur Rossetti, MD;  Location: [facility] ORS;  Service: Orthopedics;  Laterality: Left;    Allergies: Penicillins  Medications: Prior to Admission medications   Medication Sig Start Date End Date Taking? Authorizing Provider  acetaminophen (TYLENOL) 325 MG tablet Take 650 mg by mouth every 6 (six) hours as needed for mild pain.    Yes [provider]  aspirin EC 81 MG tablet Take 81 mg by mouth daily.   Yes [provider]  ferrous sulfate 325 (65 FE) MG tablet Take 325 mg by mouth 2 (two) times a week.   Yes [provider]  lisinopril-hydrochlorothiazide (PRINZIDE,ZESTORETIC) 10-12.5 MG tablet Take 1 tablet by mouth daily.   Yes [provider]  Omega-3 Fatty Acids (FISH OIL PO) Take 1 capsule by mouth every evening.    Yes [provider]  thiamine (VITAMIN B-1) 100 MG tablet Take 100 mg by mouth 2 (two) times a week.   Yes [provider]     Family History  Problem Relation Age of Onset   Leukemia Mother  Pneumonia Father     Social History   Socioeconomic History   Marital status: Divorced    Spouse name: Not on file   Number of children: 2   Years of  education: 12   Highest education level: Not on file  Occupational History   Occupation: [employer]   Financial resource strain: Not on file   Food insecurity:    Worry: Not on file    Inability: Not on file   Transportation needs:    Medical: Not on file    Non-medical: Not on file  Tobacco Use   Smoking status: Former Smoker    Types: Cigarettes    Last attempt to quit: 05/20/1992    Years since quitting: 26.9   Smokeless tobacco: Never Used  Substance and Sexual Activity   Alcohol use: Yes    Comment: Occasional   Drug use: No   Sexual activity: Yes  Lifestyle   Physical activity:    Days per week: Not on file    Minutes per session: Not on file   Stress: Not on file  Relationships   Social connections:    Talks on phone: Not on file    Gets together: Not on file    Attends religious service: Not on file    Active member of club or organization: Not on file    Attends meetings of clubs or organizations: Not on file    Relationship status: Not on file  Other Topics Concern   Not on file  Social History Narrative   Pt lives at home, divorced   Right-handed   Caffeine: soda daily     Review of Systems: A 12 point ROS discussed and pertinent positives are indicated in the HPI above.  All other systems are negative.  Review of Systems  Constitutional: Negative for chills and fever.  Respiratory: Negative for shortness of breath and wheezing.   Cardiovascular: Negative for chest pain and palpitations.  Gastrointestinal: Negative for abdominal pain.  Neurological: Positive for speech difficulty. Negative for headaches.  Psychiatric/Behavioral: Negative for behavioral problems and confusion.    Vital Signs: BP 126/81 (BP Location: Right Arm)    Pulse 67    Temp 98.2 F (36.8 C) (Oral)    Resp 18    Ht 5\' 8"  (1.727 m)    Wt 167 lb 12.3 oz (76.1 kg)    SpO2 100%    BMI 25.51 kg/m   Physical Exam Vitals signs and nursing note  reviewed.  Constitutional:      General: He is not in acute distress.    Appearance: Normal appearance.  Cardiovascular:     Rate and Rhythm: Normal rate and regular rhythm.     Heart sounds: Normal heart sounds. No murmur.  Pulmonary:     Effort: Pulmonary effort is normal. No respiratory distress.     Breath sounds: Normal breath sounds. No wheezing.  Skin:    General: Skin is warm and dry.  Neurological:     Mental Status: He is alert and oriented to person, place, and time.  Psychiatric:        Mood and Affect: Mood normal.        Behavior: Behavior normal.        Thought Content: Thought content normal.        Judgment: Judgment normal.      MD Evaluation Airway: WNL Heart: WNL Abdomen: WNL Chest/ Lungs: WNL ASA  Classification: 3 Mallampati/Airway Score: Two  Imaging: Ct Head Wo Contrast  Result Date: 05/05/2019 CLINICAL DATA:  Confusion and speech disturbance beginning 2 days ago. EXAM: CT HEAD WITHOUT CONTRAST TECHNIQUE: Contiguous axial images were obtained from the base of the skull through the vertex without intravenous contrast. COMPARISON:  None. FINDINGS: Brain: No acute finding by CT. The brainstem and cerebellum appear normal. There are old appearing cortical and subcortical infarctions in the frontal and parietal regions of both hemispheres. None of these are discernibly recent. No mass lesion, hemorrhage, hydrocephalus or extra-axial collection. Vascular: No abnormal vascular finding. Skull: Negative Sinuses/Orbits: Scattered retention cysts. No sign of acute sinusitis. Orbits negative. Other: None IMPRESSION: No acute finding by CT. Old bilateral frontal and parietal cortical and subcortical infarctions. Electronically Signed   By: Nelson Chimes M.D.   On: 05/05/2019 09:48   Ct Angio Neck W Or Wo Contrast  Result Date: 05/06/2019 CLINICAL DATA:  Confusion. Speech disturbance. Old strokes. Follow-up acute strokes left hemisphere. EXAM: CT ANGIOGRAPHY NECK  TECHNIQUE: Multidetector CT imaging of the neck was performed using the standard protocol during bolus administration of intravenous contrast. Multiplanar CT image reconstructions and MIPs were obtained to evaluate the vascular anatomy. Carotid stenosis measurements (when applicable) are obtained utilizing NASCET criteria, using the distal internal carotid diameter as the denominator. CONTRAST:  21mL OMNIPAQUE IOHEXOL 350 MG/ML SOLN COMPARISON:  MRI study yesterday. FINDINGS: Aortic arch: Aortic atherosclerosis. No evidence of dissection. Maximal diameter of the ascending aorta 3.7 cm. Right carotid system: Common carotid artery is widely patent to the bifurcation. There is near occlusion of the ICA at the origin. There is a string sign through the region of the cervical internal carotid artery to the skull base. Left carotid system: Common carotid artery shows soft plaque with stenosis 40%. At the carotid bifurcation, there is extensive plaque with occlusion of the ICA. Vertebral arteries: Right subclavian artery is widely patent. There is atherosclerotic plaque at the right vertebral artery origin with stenosis estimated at 50%. Beyond that, the right vertebral artery is widely patent to the basilar. The left subclavian artery shows a 50% stenosis in its proximal 3 cm. There is atherosclerotic plaque at the left vertebral artery origin with stenosis of 50%. Beyond that, the left vertebral artery is patent to the basilar. Skeleton: Ordinary mid cervical spondylosis. Other neck: No mass or lymphadenopathy. Upper chest: Negative IMPRESSION: Advanced atherosclerotic disease at the carotid bifurcation on the right with near occlusion the ICA. There is a patent string sign seen to the level of the skull base. Advanced atherosclerotic disease at the carotid bifurcation on the left with occlusion the ICA. No string sign identified on this side. 50% stenosis at both vertebral artery origins. Long segment 50% stenosis  affecting the proximal left subclavian artery. Electronically Signed   By: Nelson Chimes M.D.   On: 05/06/2019 16:18   Mr Brain Wo Contrast  Result Date: 05/05/2019 CLINICAL DATA:  Confusion. Speech disturbance. Old strokes. Assess for acute finding. Abnormal head CT. EXAM: MRI HEAD WITHOUT CONTRAST TECHNIQUE: Multiplanar, multiecho pulse sequences of the brain and surrounding structures were obtained without intravenous contrast. COMPARISON:  Head CT same day FINDINGS: Brain: There is a background pattern of old cortical and subcortical infarctions and both frontal and parietal regions. There are scattered areas of acute infarction affecting the left frontal and parietal cortical and subcortical brain consistent with embolic and or watershed infarctions on the left. No evidence of mass effect or hemorrhage. No acute infarctions seen on the right or in  the posterior circulation brain. No hydrocephalus. No extra-axial collection. Vascular: There appears to be abnormal flow in both internal carotid arteries at the skull base level. These could be chronically occluded. Vertebrobasilar system does show flow. Skull and upper cervical spine: Negative Sinuses/Orbits: Retention cyst right maxillary sinus. Orbits negative. Other: None IMPRESSION: Old cortical and subcortical infarctions in both frontal and parietal regions. Areas of acute infarction in the left frontal and parietal brain without significant swelling or any acute hemorrhage. No antegrade flow is seen in either internal carotid artery and chronic occlusion is suspected. Posterior circulation does show patency. These acute infarctions could be embolic or watershed in nature. Electronically Signed   By: Nelson Chimes M.D.   On: 05/05/2019 12:00   Dg Chest Portable 1 View  Result Date: 05/05/2019 CLINICAL DATA:  Mental status changes EXAM: PORTABLE CHEST 1 VIEW COMPARISON:  05/03/2016 FINDINGS: Bibasilar opacities, likely atelectasis. Heart is normal size.  No effusions. No acute bony abnormality. IMPRESSION: Bibasilar airspace opacities, atelectasis versus early infiltrates. Electronically Signed   By: Rolm Baptise M.D.   On: 05/05/2019 08:51   Ir Angio Intra Extracran Sel Com Carotid Innominate Bilat Mod Sed  Result Date: 05/08/2019 CLINICAL DATA:  New onset aphasia and mild right-sided weakness. Abnormal MRI of the brain. EXAM: IR ANGIO VERTEBRAL SEL VERTEBRAL BILAT MOD SED; BILATERAL COMMON CAROTID AND INNOMINATE ANGIOGRAPHY COMPARISON:  MRI of the brain of 05/05/2019 and CT angiogram of the head and neck of 05/06/2019. MEDICATIONS: Heparin 1000 units IV; no. The antibiotic was administered within 1 hour of the procedure. ANESTHESIA/SEDATION: Versed 1 mg IV; Fentanyl 25 mcg IV Moderate Sedation Time:  38 minutes The patient was continuously monitored during the procedure by the interventional radiology nurse under my direct supervision. CONTRAST:  Isovue 300 approximately 60 mL. FLUOROSCOPY TIME:  Fluoroscopy Time: 13 minutes 54 seconds (1365 mGy). COMPLICATIONS: None immediate. TECHNIQUE: Informed written consent was obtained from the patient after a thorough discussion of the procedural risks, benefits and alternatives. All questions were addressed. Maximal Sterile Barrier Technique was utilized including caps, mask, sterile gowns, sterile gloves, sterile drape, hand hygiene and skin antiseptic. A timeout was performed prior to the initiation of the procedure. The right groin was prepped and draped in the usual sterile fashion. Thereafter using modified Seldinger technique, transfemoral access into the right common femoral artery was obtained without difficulty. Over a 0.035 inch guidewire, a 5 French Pinnacle sheath was inserted. Through this, and also over 0.035 inch guidewire, a 5 [place] JB 1 catheter was advanced to the aortic arch region and selectively positioned in the right common carotid artery, right subclavian artery, the left common carotid artery  and the left subclavian artery. FINDINGS: The right common carotid arteriogram demonstrates the right external carotid artery and its major branches to be widely patent. The right internal carotid artery at the bulb to the cranial skull base has a smooth string sign. The vessel is contiguous with the petrous cavernous junction of the right internal carotid artery. Distally the supraclinoid segment is widely patent. Opacification is noted of the right middle cerebral artery and the right anterior cerebral artery into the capillary and venous phases. Patchy leptomeningeal and left pial collaterals seen applying the cortical surface of the parasagittal regions of the right cerebral convexity. The right vertebral artery origin demonstrates severe pre occlusive stenosis. Antegrade flow is seen in the vertebral artery to the cranial skull base. Wide patency is seen of the right vertebrobasilar junction and the right posterior-inferior  cerebellar artery. The opacified portion of the basilar artery, the posterior cerebral arteries, the superior cerebellar arteries and the anterior-inferior cerebellar arteries is grossly intact into the capillary and venous phases. Unopacified blood is seen in the basilar artery from the contralateral vertebral artery. There is retrograde opacification via a left posterior communicating artery of the left MCA distribution. Partial collateralization is also noted from leptomeningeal collaterals arising from the right posterior temporal branch of the right posterior cerebral artery. The left vertebral artery origin demonstrates severe pre occlusive stenosis. The vessel is seen to opacify to the cranial skull base. Patency is seen of the left posterior inferior cerebellar artery and the left vertebrobasilar junction. The basilar artery, the posterior cerebral arteries, the superior cerebellar arteries and the anterior-inferior cerebellar arteries opacify into the capillary and venous phases.  Retrograde opacification via the left posterior communicating artery is seen of the left middle cerebral artery distribution. Also demonstrated are leptomeningeal collaterals arising from the right posterior cerebral artery posterior temporal branch supplying the cortical regions right temporal lobe and the right parietal lobe regions. The delayed arterial phase subsequently also demonstrates retrograde opacification of the anterior cerebral arteries bilaterally with subsequent retrograde opacification into the right MCA distribution cortical branches. The left common carotid arteriogram demonstrates nonvisualization of the left internal carotid artery at the bulb or at the cranial skull base. There is no evidence of a angiographic string sign on the delayed arterial phase. Partial retrograde opacification of the left vertebral artery at the level of C1 is seen via the left occipital artery. Intracranially there is partial reconstitution of the left internal carotid artery cavernous segment and supraclinoid segment. Minimal flow is noted into the left MCA distribution. These are via the prominent anastomosis via the artery of the foramen of rotundum, and of the foramen ovale. IMPRESSION: Angiographically occluded left internal carotid artery extra cranially with partial reconstitution from the external carotid artery branches as described above. Severe high-grade stenosis of the right internal carotid artery proximally with delayed angiographic string sign being contiguous with the proximal cavernous segment of the right internal carotid artery. Severe high-grade pre occlusive stenosis of both vertebral artery origins. PLAN: Findings were discussed with the patient and the referring stroke neurologist. Electronically Signed   By: [name] M.D.   On: 05/07/2019 17:24   Ir Angio Vertebral Sel Subclavian Innominate Bilat Mod Sed  Result Date: 05/08/2019 CLINICAL DATA:  New onset aphasia and mild  right-sided weakness. Abnormal MRI of the brain. EXAM: IR ANGIO VERTEBRAL SEL VERTEBRAL BILAT MOD SED; BILATERAL COMMON CAROTID AND INNOMINATE ANGIOGRAPHY COMPARISON:  MRI of the brain of 05/05/2019 and CT angiogram of the head and neck of 05/06/2019. MEDICATIONS: Heparin 1000 units IV; no. The antibiotic was administered within 1 hour of the procedure. ANESTHESIA/SEDATION: Versed 1 mg IV; Fentanyl 25 mcg IV Moderate Sedation Time:  38 minutes The patient was continuously monitored during the procedure by the interventional radiology nurse under my direct supervision. CONTRAST:  Isovue 300 approximately 60 mL. FLUOROSCOPY TIME:  Fluoroscopy Time: 13 minutes 54 seconds (1365 mGy). COMPLICATIONS: None immediate. TECHNIQUE: Informed written consent was obtained from the patient after a thorough discussion of the procedural risks, benefits and alternatives. All questions were addressed. Maximal Sterile Barrier Technique was utilized including caps, mask, sterile gowns, sterile gloves, sterile drape, hand hygiene and skin antiseptic. A timeout was performed prior to the initiation of the procedure. The right groin was prepped and draped in the usual sterile fashion. Thereafter using modified Seldinger  technique, transfemoral access into the right common femoral artery was obtained without difficulty. Over a 0.035 inch guidewire, a 5 French Pinnacle sheath was inserted. Through this, and also over 0.035 inch guidewire, a 5 [place] JB 1 catheter was advanced to the aortic arch region and selectively positioned in the right common carotid artery, right subclavian artery, the left common carotid artery and the left subclavian artery. FINDINGS: The right common carotid arteriogram demonstrates the right external carotid artery and its major branches to be widely patent. The right internal carotid artery at the bulb to the cranial skull base has a smooth string sign. The vessel is contiguous with the petrous cavernous junction  of the right internal carotid artery. Distally the supraclinoid segment is widely patent. Opacification is noted of the right middle cerebral artery and the right anterior cerebral artery into the capillary and venous phases. Patchy leptomeningeal and left pial collaterals seen applying the cortical surface of the parasagittal regions of the right cerebral convexity. The right vertebral artery origin demonstrates severe pre occlusive stenosis. Antegrade flow is seen in the vertebral artery to the cranial skull base. Wide patency is seen of the right vertebrobasilar junction and the right posterior-inferior cerebellar artery. The opacified portion of the basilar artery, the posterior cerebral arteries, the superior cerebellar arteries and the anterior-inferior cerebellar arteries is grossly intact into the capillary and venous phases. Unopacified blood is seen in the basilar artery from the contralateral vertebral artery. There is retrograde opacification via a left posterior communicating artery of the left MCA distribution. Partial collateralization is also noted from leptomeningeal collaterals arising from the right posterior temporal branch of the right posterior cerebral artery. The left vertebral artery origin demonstrates severe pre occlusive stenosis. The vessel is seen to opacify to the cranial skull base. Patency is seen of the left posterior inferior cerebellar artery and the left vertebrobasilar junction. The basilar artery, the posterior cerebral arteries, the superior cerebellar arteries and the anterior-inferior cerebellar arteries opacify into the capillary and venous phases. Retrograde opacification via the left posterior communicating artery is seen of the left middle cerebral artery distribution. Also demonstrated are leptomeningeal collaterals arising from the right posterior cerebral artery posterior temporal branch supplying the cortical regions right temporal lobe and the right parietal lobe  regions. The delayed arterial phase subsequently also demonstrates retrograde opacification of the anterior cerebral arteries bilaterally with subsequent retrograde opacification into the right MCA distribution cortical branches. The left common carotid arteriogram demonstrates nonvisualization of the left internal carotid artery at the bulb or at the cranial skull base. There is no evidence of a angiographic string sign on the delayed arterial phase. Partial retrograde opacification of the left vertebral artery at the level of C1 is seen via the left occipital artery. Intracranially there is partial reconstitution of the left internal carotid artery cavernous segment and supraclinoid segment. Minimal flow is noted into the left MCA distribution. These are via the prominent anastomosis via the artery of the foramen of rotundum, and of the foramen ovale. IMPRESSION: Angiographically occluded left internal carotid artery extra cranially with partial reconstitution from the external carotid artery branches as described above. Severe high-grade stenosis of the right internal carotid artery proximally with delayed angiographic string sign being contiguous with the proximal cavernous segment of the right internal carotid artery. Severe high-grade pre occlusive stenosis of both vertebral artery origins. PLAN: Findings were discussed with the patient and the referring stroke neurologist. Electronically Signed   By: [name] M.D.   On:  05/07/2019 17:24    Labs:  CBC: Recent Labs    05/05/19 0825 05/08/19 0459  WBC 8.6 6.8  HGB 15.4 13.7  HCT 46.4 40.1  PLT 216 208    COAGS: Recent Labs    05/05/19 0825  INR 1.1  APTT 34    BMP: Recent Labs    05/05/19 0825 05/06/19 1132 05/07/19 0635 05/08/19 0459  NA 136 137 140 140  K 3.8 4.5 4.4 4.0  CL 103 104 105 109  CO2 26 23 27 23   GLUCOSE 103* 101* 98 107*  BUN 34* 28* 25* 21  CALCIUM 9.3 9.3 9.1 8.7*  CREATININE 1.56* 1.44* 1.35*  1.28*  GFRNONAA 46* 51* 55* 58*  GFRAA 53* 59* >60 >60    LIVER FUNCTION TESTS: Recent Labs    05/05/19 0825  BILITOT 0.8  AST 19  ALT 20  ALKPHOS 74  PROT 8.0  ALBUMIN 4.7     Assessment and Plan:  Proximal right ICA stenosis and right VA stenosis. Plan for image-guided cerebral arteriogram with possible revascularization of proximal right ICA stenosis versus right VA stenosis tomorrow with Dr. Estanislado Pandy. Patient will be NPO at midnight. Ok to proceed with Brilinta/Aspirin use per Dr. Estanislado Pandy. CBC, BMP, INR, P2Y12 pending for 0500 tomorrow AM. UA and COVID testing to be completed today (for anticipated procedure tomorrow)- RN aware.  Risks and benefits of cerebral arteriogram with intervention were discussed with the patient including, but not limited to bleeding, infection, vascular injury, contrast induced renal failure, stroke or even death. This interventional procedure involves the use of X-rays and because of the nature of the planned procedure, it is possible that we will have prolonged use of X-ray fluoroscopy. Potential radiation risks to you include (but are not limited to) the following: - A slightly elevated risk for cancer several years later in life. This risk is typically less than 0.5% percent. This risk is low in comparison to the normal incidence of human cancer, which is 33% for women and 50% for men according to the [place]. - Radiation induced injury can include skin redness, resembling a rash, tissue breakdown / ulcers and hair loss (which can be temporary or permanent).  The likelihood of either of these occurring depends on the difficulty of the procedure and whether you are sensitive to radiation due to previous procedures, disease, or genetic conditions.  IF your procedure requires a prolonged use of radiation, you will be notified and given written instructions for further action.  It is your responsibility to monitor the irradiated area  for the 2 weeks following the procedure and to notify your physician if you are concerned that you have suffered a radiation induced injury.   All of the patient's questions were answered, patient is agreeable to proceed. Consent signed and in chart.   Thank you for this interesting consult.  I greatly enjoyed meeting [facility] and look forward to participating in their care.  A copy of this report was sent to the requesting provider on this date.  Electronically Signed: Earley Abide, PA-C 05/11/2019, 1:32 PM   I spent a total of 40 Minutes in face to face in clinical consultation, greater than 50% of which was counseling/coordinating care for right ICA and right VA stenosis/revascularization.

## 2019-05-11 NOTE — Progress Notes (Signed)
[facility] PROGRESS NOTE  Jose Kaufman GGY:[id number] DOB: 28-Sep-1951 DOA: 05/05/2019 PCP: Michel Harrow, PA-C  Assessment/Plan: 1. Watershed left hemispheric infarct. In setting of dehydration with associated hypotension.  Imaging shows near occlusive extracranial carotid stenosis, aspirin and plavix was discontinued for aspirin and brilinta per IR prior to procedure, Currently meets criteria for CIR per PT/OT eval.   2. Proximal right ICA and VA stenosis.  N.p.o. at midnight for revascularization with IR, continue aspirin and Brilinta.  3. Hypertension, SBP in 140s-160s.    D/c IVF. Monitor. Want to avoid hypotension given watershed infarcts. Previously holding home BP meds given normotensive  4. HLD, stable.  Started on Lipitor, LDL 113.  Code Status: Full code Family Communication: No family at bedside (indicate person spoken with, relationship, and if by phone, the number) Disposition Plan: Plan for attempt at R ICA revascularization, Npo at midnight, PT/OT recommends CIR   Consultants:  Neurology  Procedures: Cerebral angiogram, 5/28: Findings. 1.Occluded left ICA prox. 2.Severe preocclusive stenosis of right ICA prox.  3. 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 year old male with medical history significant for HTN, HLD who presented on 05/05/2019 with slurred speech, difficulty with comprehension and word finding and lower extremity weakness that he noticed on 5/23 normal in the yard which he initially attributed to "heat stroke" and was found to have acute infarct of left frontoparietal brain as well as old cortical/subcortical infarcts.. No acute events overnight Hopeful for procedure tomorrow Denies any chest pain, no new weakness, no changes in speech   Objective: Vitals:   05/11/19 1236 05/11/19 1616   BP: 126/81 138/83  Pulse: 67 76  Resp: 18 18  Temp: 98.2 F (36.8 C) 98.6 F (37 C)  SpO2: 100% 100%    Intake/Output Summary (Last 24 hours) at 05/11/2019 1753 Last data filed at 05/11/2019 1700 Gross per 24 hour  Intake -  Output 1000 ml  Net -1000 ml   Filed Weights   05/05/19 0825 05/05/19 2144  Weight: 77.1 kg 76.1 kg    Exam:   General: Elderly male, no distress  Cardiovascular: Regular rate and rhythm, no edema  Respiratory: Normal respiratory effort on room air  Abdomen: Soft, nontender, normal bowel sounds  Musculoskeletal: Normal range of motion  Skin no rashes or lesions  Neurologic alert oriented x4, mild dysarthria, some hesitation/word finding difficulties strength intact bilaterally in all extremities  Data Reviewed: Basic Metabolic Panel: Recent Labs  Lab 05/05/19 0825 05/06/19 1132 05/07/19 0635 05/08/19 0459  NA 136 137 140 140  K 3.8 4.5 4.4 4.0  CL 103 104 105 109  CO2 26 23 27 23   GLUCOSE 103* 101* 98 107*  BUN 34* 28* 25* 21  CREATININE 1.56* 1.44* 1.35* 1.28*  CALCIUM 9.3 9.3 9.1 8.7*  MG 2.2  --   --   --    Liver Function Tests: Recent Labs  Lab 05/05/19 0825  AST 19  ALT 20  ALKPHOS 74  BILITOT 0.8  PROT 8.0  ALBUMIN 4.7   No results for input(s): LIPASE, AMYLASE in the last 168 hours. No results for input(s): AMMONIA in the last 168 hours. CBC: Recent Labs  Lab 05/05/19 0825 05/08/19 0459  WBC 8.6 6.8  NEUTROABS 6.8 4.9  HGB 15.4 13.7  HCT 46.4 40.1  MCV 95.1 91.6  PLT 216  208   Cardiac Enzymes: Recent Labs  Lab 05/06/19 1132  CKTOTAL 151   BNP (last 3 results) No results for input(s): BNP in the last 8760 hours.  ProBNP (last 3 results) No results for input(s): PROBNP in the last 8760 hours.  CBG: No results for input(s): GLUCAP in the last 168 hours.  Recent Results (from the past 240 hour(s))  SARS Coronavirus 2 (CEPHEID - Performed in [place] hospital lab), Hosp Order     Status: None    Collection Time: 05/05/19  8:59 AM  Result Value Ref Range Status   SARS Coronavirus 2 NEGATIVE NEGATIVE Final    Comment: (NOTE) If result is NEGATIVE SARS-CoV-2 target nucleic acids are NOT DETECTED. The SARS-CoV-2 RNA is generally detectable in upper and lower  respiratory specimens during the acute phase of infection. The lowest  concentration of SARS-CoV-2 viral copies this assay can detect is 250  copies / mL. A negative result does not preclude SARS-CoV-2 infection  and should not be used as the sole basis for treatment or other  patient management decisions.  A negative result may occur with  improper specimen collection / handling, submission of specimen other  than nasopharyngeal swab, presence of viral mutation(s) within the  areas targeted by this assay, and inadequate number of viral copies  (<250 copies / mL). A negative result must be combined with clinical  observations, patient history, and epidemiological information. If result is POSITIVE SARS-CoV-2 target nucleic acids are DETECTED. The SARS-CoV-2 RNA is generally detectable in upper and lower  respiratory specimens dur ing the acute phase of infection.  Positive  results are indicative of active infection with SARS-CoV-2.  Clinical  correlation with patient history and other diagnostic information is  necessary to determine patient infection status.  Positive results do  not rule out bacterial infection or co-infection with other viruses. If result is PRESUMPTIVE POSTIVE SARS-CoV-2 nucleic acids MAY BE PRESENT.   A presumptive positive result was obtained on the submitted specimen  and confirmed on repeat testing.  While 2019 novel coronavirus  (SARS-CoV-2) nucleic acids may be present in the submitted sample  additional confirmatory testing may be necessary for epidemiological  and / or clinical management purposes  to differentiate between  SARS-CoV-2 and other Sarbecovirus currently known to infect humans.   If clinically indicated additional testing with an alternate test  methodology [phone]) is advised. The SARS-CoV-2 RNA is generally  detectable in upper and lower respiratory sp ecimens during the acute  phase of infection. The expected result is Negative. Fact Sheet for Patients:  StrictlyIdeas.no Fact Sheet for Healthcare Providers: BankingDealers.co.za This test is not yet approved or cleared by the [place] FDA and has been authorized for detection and/or diagnosis of SARS-CoV-2 by FDA under an Emergency Use Authorization (EUA).  This EUA will remain in effect (meaning this test can be used) for the duration of the COVID-19 declaration under Section 564(b)(1) of the Act, 21 U.S.C. section 360bbb-3(b)(1), unless the authorization is terminated or revoked sooner. Performed at [facility], [place] [address], [place], [place] [address]   SARS Coronavirus 2 (CEPHEID - Performed in [place] hospital lab), Hosp Order     Status: None   Collection Time: 05/08/19  8:24 AM  Result Value Ref Range Status   SARS Coronavirus 2 NEGATIVE NEGATIVE Final    Comment: (NOTE) If result is NEGATIVE SARS-CoV-2 target nucleic acids are NOT DETECTED. The SARS-CoV-2 RNA is generally detectable in upper and  lower  respiratory specimens during the acute phase of infection. The lowest  concentration of SARS-CoV-2 viral copies this assay can detect is 250  copies / mL. A negative result does not preclude SARS-CoV-2 infection  and should not be used as the sole basis for treatment or other  patient management decisions.  A negative result may occur with  improper specimen collection / handling, submission of specimen other  than nasopharyngeal swab, presence of viral mutation(s) within the  areas targeted by this assay, and inadequate number of viral copies  (<250 copies / mL). A negative result must be combined with clinical   observations, patient history, and epidemiological information. If result is POSITIVE SARS-CoV-2 target nucleic acids are DETECTED. The SARS-CoV-2 RNA is generally detectable in upper and lower  respiratory specimens dur ing the acute phase of infection.  Positive  results are indicative of active infection with SARS-CoV-2.  Clinical  correlation with patient history and other diagnostic information is  necessary to determine patient infection status.  Positive results do  not rule out bacterial infection or co-infection with other viruses. If result is PRESUMPTIVE POSTIVE SARS-CoV-2 nucleic acids MAY BE PRESENT.   A presumptive positive result was obtained on the submitted specimen  and confirmed on repeat testing.  While 2019 novel coronavirus  (SARS-CoV-2) nucleic acids may be present in the submitted sample  additional confirmatory testing may be necessary for epidemiological  and / or clinical management purposes  to differentiate between  SARS-CoV-2 and other Sarbecovirus currently known to infect humans.  If clinically indicated additional testing with an alternate test  methodology [id number]) is advised. The SARS-CoV-2 RNA is generally  detectable in upper and lower respiratory sp ecimens during the acute  phase of infection. The expected result is Negative. Fact Sheet for Patients:  StrictlyIdeas.no Fact Sheet for Healthcare Providers: BankingDealers.co.za This test is not yet approved or cleared by the [place] FDA and has been authorized for detection and/or diagnosis of SARS-CoV-2 by FDA under an Emergency Use Authorization (EUA).  This EUA will remain in effect (meaning this test can be used) for the duration of the COVID-19 declaration under Section 564(b)(1) of the Act, 21 U.S.C. section 360bbb-3(b)(1), unless the authorization is terminated or revoked sooner. Performed at [facility], [place] [address]  [place], [place] [address]   SARS Coronavirus 2 (CEPHEID - Performed in [place] hospital lab), Hosp Order     Status: None   Collection Time: 05/11/19  1:37 PM  Result Value Ref Range Status   SARS Coronavirus 2 NEGATIVE NEGATIVE Final    Comment: (NOTE) If result is NEGATIVE SARS-CoV-2 target nucleic acids are NOT DETECTED. The SARS-CoV-2 RNA is generally detectable in upper and lower  respiratory specimens during the acute phase of infection. The lowest  concentration of SARS-CoV-2 viral copies this assay can detect is 250  copies / mL. A negative result does not preclude SARS-CoV-2 infection  and should not be used as the sole basis for treatment or other  patient management decisions.  A negative result may occur with  improper specimen collection / handling, submission of specimen other  than nasopharyngeal swab, presence of viral mutation(s) within the  areas targeted by this assay, and inadequate number of viral copies  (<250 copies / mL). A negative result must be combined with clinical  observations, patient history, and epidemiological information. If result is POSITIVE SARS-CoV-2 target nucleic acids are DETECTED. The SARS-CoV-2 RNA is generally detectable in upper and  lower  respiratory specimens dur ing the acute phase of infection.  Positive  results are indicative of active infection with SARS-CoV-2.  Clinical  correlation with patient history and other diagnostic information is  necessary to determine patient infection status.  Positive results do  not rule out bacterial infection or co-infection with other viruses. If result is PRESUMPTIVE POSTIVE SARS-CoV-2 nucleic acids MAY BE PRESENT.   A presumptive positive result was obtained on the submitted specimen  and confirmed on repeat testing.  While 2019 novel coronavirus  (SARS-CoV-2) nucleic acids may be present in the submitted sample  additional confirmatory testing may be necessary for epidemiological  and / or  clinical management purposes  to differentiate between  SARS-CoV-2 and other Sarbecovirus currently known to infect humans.  If clinically indicated additional testing with an alternate test  methodology [phone]) is advised. The SARS-CoV-2 RNA is generally  detectable in upper and lower respiratory sp ecimens during the acute  phase of infection. The expected result is Negative. Fact Sheet for Patients:  StrictlyIdeas.no Fact Sheet for Healthcare Providers: BankingDealers.co.za This test is not yet approved or cleared by the [place] FDA and has been authorized for detection and/or diagnosis of SARS-CoV-2 by FDA under an Emergency Use Authorization (EUA).  This EUA will remain in effect (meaning this test can be used) for the duration of the COVID-19 declaration under Section 564(b)(1) of the Act, 21 U.S.C. section 360bbb-3(b)(1), unless the authorization is terminated or revoked sooner. Performed at [facility], [place] [address], [place], [place] [address]      Studies: No results found.  Scheduled Meds: . aspirin  81 mg Oral Daily  . atorvastatin  80 mg Oral q1800  . ticagrelor  90 mg Oral BID   Continuous Infusions:   Active Problems:   Essential hypertension   CVA (cerebral vascular accident) [facility])   Internal carotid artery occlusion, bilateral   Hyperlipidemia      Desiree Hane  [facility]

## 2019-05-11 NOTE — Progress Notes (Signed)
Occupational Therapy Treatment Patient Details Name: Marcelus Dubberly MRN: [id number] DOB: 06/21/1951 Today's Date: 05/11/2019    History of present illness Pt is a 68 y/o male who presented from [facility] ED with aphasia and confusion. MRI revealed old cortical and subcortical infarcts in both frontal and parietal regions, as well as areas of acute infarction in the L frontal and parietal lobes. PMH significant for PVC's, HTN, bilateral THR.   OT comments  Patient supine in bed and agreeable to OT.  Patient demonstrates ability to complete transfers and functional mobility using RW with min guard assist for safety/balance.  Able to recall, retrieve items in room without cueing, but once standing at sink requires min cueing to recall tasks to complete and sequence through ADL tasks.  Mild impaired coordination with R UE (>L UE). Will follow.    Follow Up Recommendations  CIR    Equipment Recommendations  Other (comment)(defer to next venue )    Recommendations for Other Services      Precautions / Restrictions Precautions Precautions: Fall Restrictions Weight Bearing Restrictions: No       Mobility Bed Mobility Overal bed mobility: Needs Assistance Bed Mobility: Supine to Sit;Sit to Supine     Supine to sit: Supervision Sit to supine: Supervision   General bed mobility comments: continues to require increased time and effort  Transfers Overall transfer level: Needs assistance Equipment used: Rolling walker (2 wheeled) Transfers: Sit to/from Stand Sit to Stand: Min guard         General transfer comment: continues to require min gaurd for inital balance     Balance Overall balance assessment: Needs assistance Sitting-balance support: Feet supported;No upper extremity supported Sitting balance-Leahy Scale: Good     Standing balance support: No upper extremity supported;During functional activity Standing balance-Leahy Scale: Fair Standing balance comment: min guard for  safety and balance                           ADL either performed or assessed with clinical judgement   ADL Overall ADL's : Needs assistance/impaired     Grooming: Wash/dry hands;Wash/dry face;Min guard;Standing   Upper Body Bathing: Min guard;Standing Upper Body Bathing Details (indicate cue type and reason): min guard for balance, min-mod cueing to sequence bathing,drying and applying lotion; noted decreased coordiantion with RU E              Toilet Transfer: Min guard;Ambulation;RW Toilet Transfer Details (indicate cue type and reason): simulated in room         Functional mobility during ADLs: Min guard;Rolling walker General ADL Comments: completed path finding task to locate room on unit, required increased time and cueing to recall task during mobility; min cueing for problem solving, initally missing destination on R side (then locating on L side)     Vision       Perception     Praxis      Cognition Arousal/Alertness: Awake/alert Behavior During Therapy: WFL for tasks assessed/performed Overall Cognitive Status: Impaired/Different from baseline Area of Impairment: Attention;Memory;Problem solving;Awareness                   Current Attention Level: Selective Memory: Decreased short-term memory     Awareness: Emergent Problem Solving: Slow processing;Difficulty sequencing;Requires verbal cues General Comments: pt able to follow commands to retrieve items in room (2), then required min-mod cueing to problem sovle and sequence tasks at sink  Exercises     Shoulder Instructions       General Comments      Pertinent Vitals/ Pain       Pain Assessment: No/denies pain  Home Living                                          Prior Functioning/Environment              Frequency  Min 3X/week        Progress Toward Goals  OT Goals(current goals can now be found in the care plan section)  Progress  towards OT goals: Progressing toward goals  Acute Rehab OT Goals Patient Stated Goal: get to CIR OT Goal Formulation: With patient  Plan Discharge plan remains appropriate;Frequency remains appropriate    Co-evaluation                 AM-PAC OT "6 Clicks" Daily Activity     Outcome Measure   Help from another person eating meals?: None Help from another person taking care of personal grooming?: A Little Help from another person toileting, which includes using toliet, bedpan, or urinal?: A Little Help from another person bathing (including washing, rinsing, drying)?: A Little Help from another person to put on and taking off regular upper body clothing?: A Little Help from another person to put on and taking off regular lower body clothing?: A Little 6 Click Score: 19    End of Session Equipment Utilized During Treatment: Rolling walker  OT Visit Diagnosis: Unsteadiness on feet (R26.81);Other abnormalities of gait and mobility (R26.89);Apraxia (R48.2);Other symptoms and signs involving cognitive function   Activity Tolerance Patient tolerated treatment well   Patient Left in bed;with bed alarm set;with call bell/phone within reach   Nurse Communication Mobility status        Time: 0300-9233 OT Time Calculation (min): 24 min  Charges: OT General Charges $OT Visit: 1 Visit OT Treatments $Self Care/Home Management : 8-22 mins $Therapeutic Activity: 8-22 mins  Delight Stare, [place] Pager [phone] Office [phone]    Delight Stare 05/11/2019, 4:31 PM

## 2019-05-11 NOTE — Progress Notes (Signed)
Inpatient Rehabilitation-Admissions Coordinator   Noted plan for R ICA revascularization tomorrow. Pt will need to be assessed by therapies post procedure (once medically appropriate) and determine to still have CIR needs. If CIR needs remain, AC will begin insurance authorization request. AC reviewed plan with pt. He still wants to proceed with CIR if he qualifies.   Please call if questions.   Jhonnie Garner, OTR/L  Rehab Admissions Coordinator  [phone] 05/11/2019 6:32 PM

## 2019-05-12 ENCOUNTER — Inpatient Hospital Stay (HOSPITAL_COMMUNITY): Payer: Commercial Managed Care - PPO

## 2019-05-12 ENCOUNTER — Inpatient Hospital Stay (HOSPITAL_COMMUNITY): Payer: Commercial Managed Care - PPO | Admitting: Anesthesiology

## 2019-05-12 ENCOUNTER — Other Ambulatory Visit (HOSPITAL_COMMUNITY): Payer: Self-pay

## 2019-05-12 ENCOUNTER — Encounter (HOSPITAL_COMMUNITY): Payer: Self-pay | Admitting: Anesthesiology

## 2019-05-12 ENCOUNTER — Encounter (HOSPITAL_COMMUNITY)
Admission: EM | Disposition: A | Discharge status: Rehabilitation Facility | Payer: Self-pay | Source: Home / Self Care | Attending: Internal Medicine

## 2019-05-12 DIAGNOSIS — I6501 Occlusion and stenosis of right vertebral artery: Secondary | ICD-10-CM | POA: Diagnosis present

## 2019-05-12 HISTORY — PX: IR US GUIDE VASC ACCESS RIGHT: IMG2390

## 2019-05-12 HISTORY — PX: IR TRANSCATH EXCRAN VERT OR CAR A STENT: IMG1955

## 2019-05-12 HISTORY — PX: RADIOLOGY WITH ANESTHESIA: SHX6223

## 2019-05-12 LAB — HEPARIN LEVEL (UNFRACTIONATED): Heparin Unfractionated: 0.11 IU/mL — ABNORMAL LOW (ref 0.30–0.70)

## 2019-05-12 LAB — MRSA PCR SCREENING: MRSA by PCR: NEGATIVE

## 2019-05-12 LAB — POCT ACTIVATED CLOTTING TIME
Activated Clotting Time: 175 seconds
Activated Clotting Time: 180 seconds

## 2019-05-12 LAB — PLATELET INHIBITION P2Y12: Platelet Function  P2Y12: 6 [PRU] — ABNORMAL LOW (ref 182–335)

## 2019-05-12 IMAGING — XA IR TRANSCATH EXCRAN VERT OR CAR A STENT
1 of 2 series · 10 of 24 positions shown · IV contrast (IODINE)
Comparison: Diagnostic catheter arteriogram of [DATE].

CLINICAL DATA: Severe extracranial cerebrovascular disease.

Bilaterally severely stenotic vertebral artery origins and occlusion
of the left internal carotid artery.
EXAM:
IR ULTRASOUND GUIDANCE VASC ACCESS RIGHT; IR TRANSCATH EXCRAN VERT
OR CAR A STENT
TECHNIQUE: Informed written consent was obtained from the patient after a
thorough discussion of the procedural risks, benefits and
alternatives. All questions were addressed. Maximal Sterile Barrier
Technique was utilized including caps, mask, sterile gowns, sterile
gloves, sterile drape, hand hygiene and skin antiseptic. A timeout
was performed prior to the initiation of the procedure.

[Series 300: dr. (person_name) · 10 of 163 slices shown]
[im 1/163]
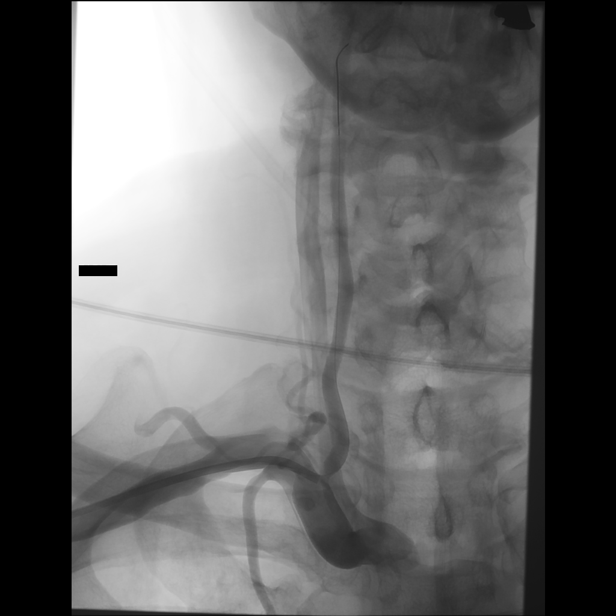
[im 15/163]
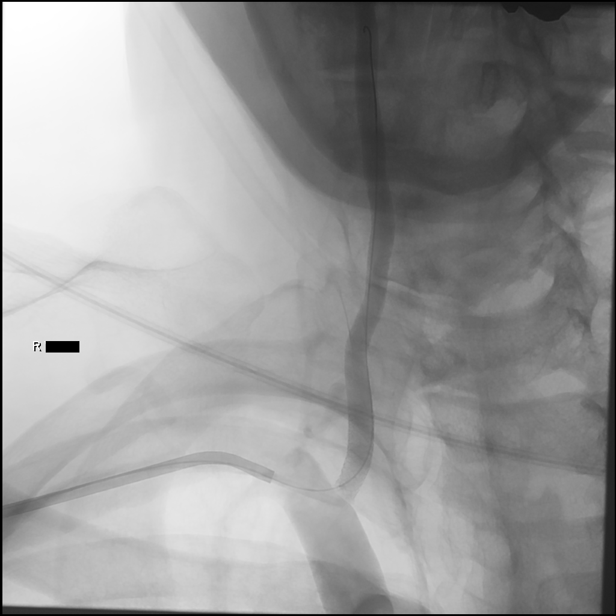
[im 37/163]
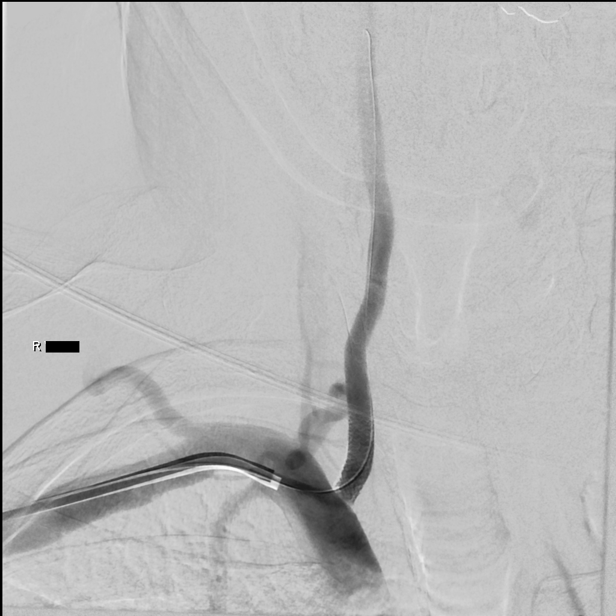
[im 52/163]
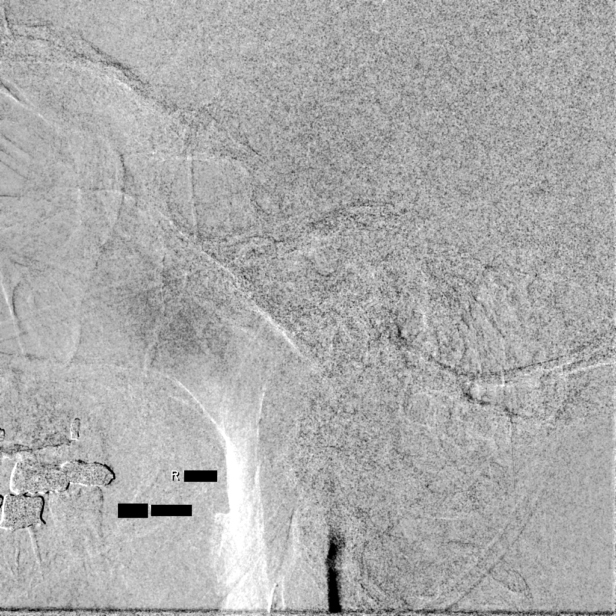
[im 67/163]
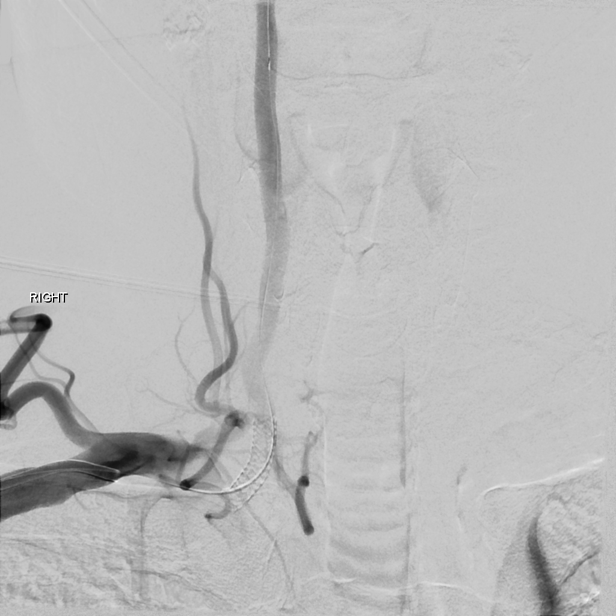
[im 89/163]
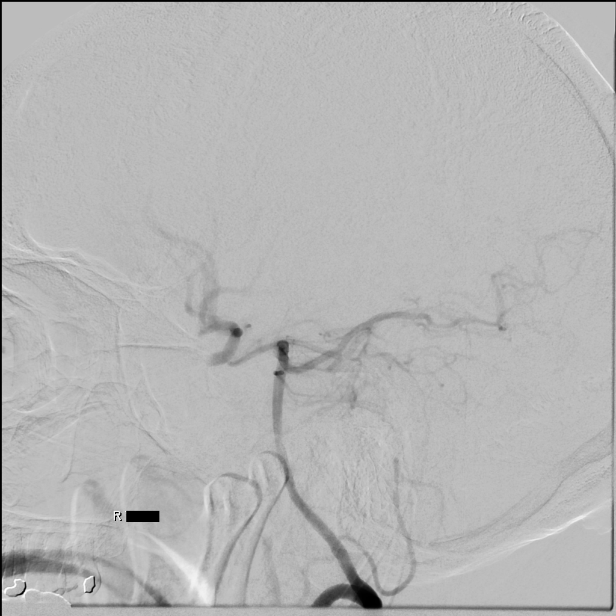
[im 104/163]
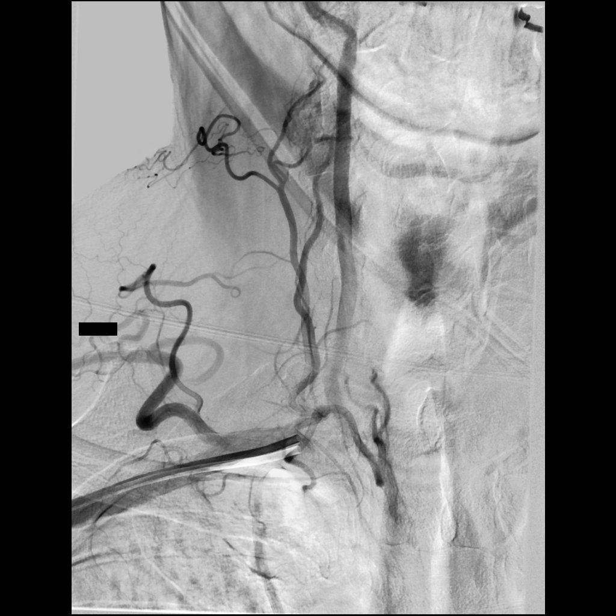
[im 126/163]
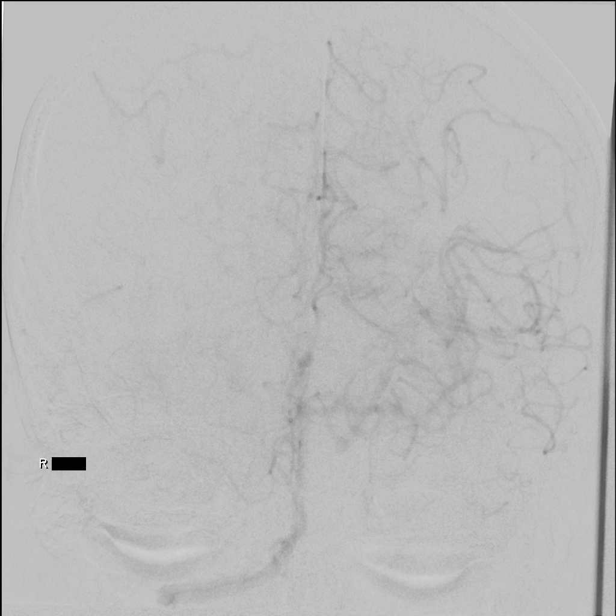
[im 140/163]
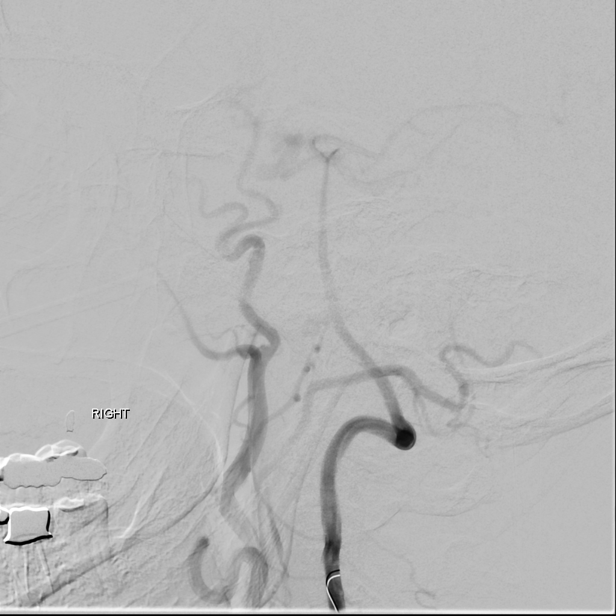
[im 155/163]
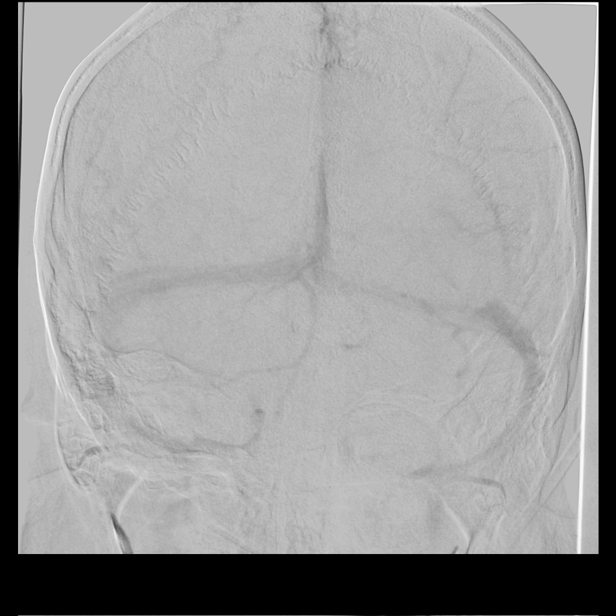

[10 of 24 positions shown; findings below may reference images not displayed]

MEDICATIONS:
Heparin 3,500 units IV and IA. Antibiotic was administered within 1
hour of the procedure.

ANESTHESIA/SEDATION:
Mac anesthesia as per the [REDACTED] at [REDACTED].

CONTRAST:  Isovue 300 approximately 60 cc.

FLUOROSCOPY TIME:  Fluoroscopy Time: 23 minutes 6 seconds (845 mGy).

COMPLICATIONS:
None immediate.
The right forearm to the right radius was prepped and draped in the
usual sterile fashion.

The radial artery was then identified using ultrasound with images
obtained to document the right radial artery.

Dorsal palmar anastomosis was then verified.

Ultrasound guidance was then utilized to access the right radial
artery with a micropuncture set. This was then exchanged over a
0.021 inch micro guidewire for a [DATE] French radial sheath. The
obturator and wire were then removed. Good aspiration was obtained
from the side port of the radial sheath. A cocktail of 2.5 mg of
verapamil, [3A] units of heparin, and 200 mcg of nitroglycerin were
then injected in a diluted form without event.

Right radial artery angiogram was then obtained.

Using roadmap technique, and over a 0.035 inch Roadrunner guidewire,
a 6 French Envoy guide catheter was then advanced and positioned
just proximal to the origin of the right vertebral artery.

The guidewire was removed. Good aspiration was obtained from the hub
of the Envoy 6 French guide catheter. An arteriogram performed again
demonstrated severe high-grade stenosis of the right vertebral
artery. More distally, the vessel was seen to opacify to the cranial
skull base with patency of the right posterior-inferior cerebellar
artery and retrograde opacification via the left posterior
communicating artery of the left MCA distribution. Again
demonstrated were the extensive pial collaterals arising from the
right posterior cerebral artery P3 P4 segments augmenting flow into
the right parieto-occipital and temporal regions.

Using biplane roadmap technique and constant fluoroscopic guidance
an 021 Trevo ProVue microcatheter was then advanced over a
inch Softip Synchro micro guidewire to the distal end of the right
vertebral artery.

The micro guidewire was then removed. Free aspiration of blood was
obtained at the hub of the microcatheter which was then exchanged
for a 300 cm 014 inch Transend EX exchange micro guidewire. The
micro guidewire had a J configuration to avoid dissections or
inducing spasm.

Measurements were then performed of the right vertebral artery just
distal to the high-grade stenosis in the length of the
atherosclerotic lesion.

A Multi-Link Vision 4 mm x 15 mm stent was then retrogradely purged
with heparinized saline infusion.

Using the rapid exchange technique, the balloon mounted stent was
then advanced over the exchange micro guidewire and positioned
across the high-grade stenosis.

After having the patient hold his breath, and using a micro
inflation syringe device via micro tubing, the stent was then
deployed by inflating the balloon.

It was maintained for approximately 15 seconds. The balloon was then
deflated and retrieved and removed. A control arteriogram performed
through the Envoy 6 French guide catheter in the right subclavian
artery demonstrated significantly improved flow through the stented
and angioplastied segment.

Excellent flow was seen more distally into the intracranial
compartment.

The medial portion of the stent, however, was seen above the medial
aspect of the stenosis whilst the lateral aspect of the proximal
stent was seen just inside the right subclavian artery.

In order to prevent restenosis it was decided to place a second
stent device. A 4 mm x 8 mm Multi-Link Vision stent was then
advanced in its delivery microcatheter and positioned again this
time with the proximal marker just proximal to the orifice of the
right vertebral artery.

Again with the patient having to hold his breath, this was again
deployed using micro inflation syringe device via micro tubing.

The balloon was then deflated and retrieved and removed. The
exchange micro guidewire was maintained in the distal right
vertebral artery.

A control arteriogram performed through the Envoy 6 French guide
catheter demonstrated excellent and improved apposition of the
proximal stent with the coverage of the previously uncovered medial
aspect of the orifice.

Control arteriograms were then performed at 15 and 30 minutes post
placement of the second stent. These continued to demonstrate
excellent flow through the stented segment and also intracranially.
Improved hemodynamic flow was noted into the left middle cerebral
artery from the left PCOM. Also demonstrated was excellent flow in
the pial collaterals from the right P3 P4 segments.

The exchange micro guidewire was then retrieved and removed. A final
control arteriogram performed through the 6 French Envoy guide
catheter continued to demonstrate excellent flow through the stented
segment in the proximal right vertebral artery.

Interesting of note also noted was suggestion of improved caliber of
the right internal carotid artery at the cranial skull base.

The 6 French Envoy guide catheter was retrieved and removed. A right
radial band was then applied in the usual manner for hemostasis in
the right radial artery.

The distal right radial pulse was verified following the placement
of the wrist band.

Throughout the procedure, the patient's neurological and hemodynamic
status remained stable.

At the end of the procedure, the patient continued to have mild
word-finding difficulties unchanged from prior to the procedure.

He was then transferred to the neuro ICU to continue low-dose IV
heparin and close neurologic monitoring.

Overnight the patient had no complications. The following morning,
patient appeared neurologically unchanged. His fine motor movements
in both fingers were symmetrical. He had no vision problems. Right
radial pulse remained patent with the puncture site being stable.

No evidence of hematoma was seen.

The patient was then returned to the admittance service. Patient was
asked to continue taking his aspirin and his Brilinta 90 mg b.i.d.
He was strongly advised to maintain adequate hydration by drinking
water. A follow-up arteriogram is planned in approximately 4-6 weeks
in order to evaluate probable further interventions to revascularize
the origin of the left vertebral artery and/or the right internal
carotid artery proximally. Patient was advised to call 911 should he
have any symptoms of worsening speech, facial droop, motor weakness,
vision problems. Patient expressed understanding.
FINDINGS: See above.
IMPRESSION: Status post endovascular revascularization of high-grade pre
occlusive right vertebral artery origin with stent assisted
angioplasty as described.

PLAN:
Return for catheter arteriogram for follow-up and possible
intervention to revascularize the left vertebral artery origin or
the right internal carotid artery proximally in 4-6 weeks time.

## 2019-05-12 SURGERY — IR WITH ANESTHESIA
Anesthesia: Monitor Anesthesia Care

## 2019-05-12 MED ORDER — VERAPAMIL HCL 2.5 MG/ML IV SOLN
INTRAVENOUS | Status: AC
  Filled 2019-05-12: qty 2

## 2019-05-12 MED ORDER — TICAGRELOR 90 MG PO TABS: 90.0000 mg | ORAL_TABLET | Freq: Once | ORAL | Status: DC

## 2019-05-12 MED ORDER — PROTAMINE SULFATE 10 MG/ML IV SOLN
INTRAVENOUS | Status: DC | PRN
  Administered 2019-05-12: 5 mg via INTRAVENOUS

## 2019-05-12 MED ORDER — FENTANYL CITRATE (PF) 100 MCG/2ML IJ SOLN: 25.0000 ug | INTRAMUSCULAR | Status: DC | PRN

## 2019-05-12 MED ORDER — ACETAMINOPHEN 650 MG RE SUPP: 650.0000 mg | RECTAL | Status: DC | PRN

## 2019-05-12 MED ORDER — ACETAMINOPHEN 160 MG/5ML PO SOLN: 650.0000 mg | ORAL | Status: DC | PRN

## 2019-05-12 MED ORDER — HEPARIN SODIUM (PORCINE) 1000 UNIT/ML IJ SOLN
INTRAMUSCULAR | Status: AC
  Filled 2019-05-12: qty 1

## 2019-05-12 MED ORDER — SODIUM CHLORIDE 0.9 % IV SOLN
INTRAVENOUS | Status: DC
  Administered 2019-05-12: 17:00:00 via INTRAVENOUS

## 2019-05-12 MED ORDER — ACETAMINOPHEN 325 MG PO TABS: 650.0000 mg | ORAL_TABLET | ORAL | Status: DC | PRN

## 2019-05-12 MED ORDER — HEPARIN (PORCINE) 25000 UT/250ML-% IV SOLN
INTRAVENOUS | Status: AC
  Administered 2019-05-12: 500 [IU]/h via INTRAVENOUS
  Filled 2019-05-12: qty 250

## 2019-05-12 MED ORDER — HEPARIN SODIUM (PORCINE) 1000 UNIT/ML IJ SOLN
INTRAMUSCULAR | Status: DC | PRN
  Administered 2019-05-12: 500 [IU] via INTRAVENOUS
  Administered 2019-05-12: 1000 [IU] via INTRAVENOUS

## 2019-05-12 MED ORDER — NITROGLYCERIN 1 MG/10 ML FOR IR/CATH LAB
INTRA_ARTERIAL | Status: AC
  Filled 2019-05-12: qty 10

## 2019-05-12 MED ORDER — IOHEXOL 300 MG/ML  SOLN
50.0000 mL | Freq: Once | INTRAMUSCULAR | Status: AC | PRN
  Administered 2019-05-12: 15 mL via INTRA_ARTERIAL

## 2019-05-12 MED ORDER — CLEVIDIPINE BUTYRATE 0.5 MG/ML IV EMUL: 0.0000 mg/h | INTRAVENOUS | Status: AC

## 2019-05-12 MED ORDER — TICAGRELOR 90 MG PO TABS: 90.0000 mg | ORAL_TABLET | Freq: Two times a day (BID) | ORAL | Status: DC

## 2019-05-12 MED ORDER — VANCOMYCIN HCL 1000 MG IV SOLR
1000.0000 mg | INTRAVENOUS | Status: AC
  Administered 2019-05-12: 1000 mg via INTRAVENOUS
  Filled 2019-05-12: qty 1000

## 2019-05-12 MED ORDER — ACETAMINOPHEN 10 MG/ML IV SOLN: 1000.0000 mg | Freq: Once | INTRAVENOUS | Status: DC | PRN

## 2019-05-12 MED ORDER — OXYCODONE HCL 5 MG PO TABS: 5.0000 mg | ORAL_TABLET | Freq: Once | ORAL | Status: DC | PRN

## 2019-05-12 MED ORDER — HEPARIN (PORCINE) 25000 UT/250ML-% IV SOLN
700.0000 [IU]/h | INTRAVENOUS | Status: DC
  Administered 2019-05-12: 700 [IU]/h via INTRAVENOUS
  Filled 2019-05-12: qty 250

## 2019-05-12 MED ORDER — ASPIRIN 81 MG PO CHEW
81.0000 mg | CHEWABLE_TABLET | Freq: Every day | ORAL | Status: DC
  Administered 2019-05-13 – 2019-05-14 (×2): 81 mg via ORAL
  Filled 2019-05-12 (×2): qty 1

## 2019-05-12 MED ORDER — PHENYLEPHRINE HCL-NACL 10-0.9 MG/250ML-% IV SOLN
INTRAVENOUS | Status: AC
  Filled 2019-05-12: qty 250

## 2019-05-12 MED ORDER — ASPIRIN 81 MG PO CHEW: 81.0000 mg | CHEWABLE_TABLET | Freq: Every day | ORAL | Status: DC

## 2019-05-12 MED ORDER — ESMOLOL HCL 100 MG/10ML IV SOLN
INTRAVENOUS | Status: DC | PRN
  Administered 2019-05-12 (×4): 10 mg via INTRAVENOUS
  Administered 2019-05-12: 30 mg via INTRAVENOUS
  Administered 2019-05-12: 10 mg via INTRAVENOUS

## 2019-05-12 MED ORDER — SODIUM CHLORIDE 0.9 % IV SOLN
INTRAVENOUS | Status: DC
  Administered 2019-05-12: 09:00:00 via INTRAVENOUS

## 2019-05-12 MED ORDER — PROMETHAZINE HCL 25 MG/ML IJ SOLN: 6.2500 mg | INTRAMUSCULAR | Status: DC | PRN

## 2019-05-12 MED ORDER — ASPIRIN 81 MG PO CHEW: 81.0000 mg | CHEWABLE_TABLET | Freq: Once | ORAL | Status: DC

## 2019-05-12 MED ORDER — HYDROMORPHONE HCL 1 MG/ML IJ SOLN: 0.2500 mg | INTRAMUSCULAR | Status: DC | PRN

## 2019-05-12 MED ORDER — HEPARIN (PORCINE) 25000 UT/250ML-% IV SOLN
500.0000 [IU]/h | INTRAVENOUS | Status: DC
  Administered 2019-05-12: 500 [IU]/h via INTRAVENOUS
  Filled 2019-05-12: qty 250

## 2019-05-12 MED ORDER — LIDOCAINE HCL 1 % IJ SOLN
INTRAMUSCULAR | Status: AC
  Filled 2019-05-12: qty 20

## 2019-05-12 MED ORDER — IOHEXOL 300 MG/ML  SOLN
150.0000 mL | Freq: Once | INTRAMUSCULAR | Status: AC | PRN
  Administered 2019-05-12: 75 mL via INTRAVENOUS

## 2019-05-12 MED ORDER — OXYCODONE HCL 5 MG/5ML PO SOLN: 5.0000 mg | Freq: Once | ORAL | Status: DC | PRN

## 2019-05-12 MED ORDER — MIDAZOLAM HCL 5 MG/5ML IJ SOLN
INTRAMUSCULAR | Status: DC | PRN
  Administered 2019-05-12 (×2): 1 mg via INTRAVENOUS

## 2019-05-12 MED ORDER — TICAGRELOR 90 MG PO TABS
90.0000 mg | ORAL_TABLET | Freq: Two times a day (BID) | ORAL | Status: DC
  Administered 2019-05-12 – 2019-05-14 (×4): 90 mg via ORAL
  Filled 2019-05-12 (×4): qty 1

## 2019-05-12 MED ORDER — HYDRALAZINE HCL 20 MG/ML IJ SOLN
INTRAMUSCULAR | Status: DC | PRN
  Administered 2019-05-12: 4 mg via INTRAVENOUS
  Administered 2019-05-12: 3 mg via INTRAVENOUS
  Administered 2019-05-12: 2 mg via INTRAVENOUS
  Administered 2019-05-12: 3 mg via INTRAVENOUS
  Administered 2019-05-12: 2 mg via INTRAVENOUS
  Administered 2019-05-12: 4 mg via INTRAVENOUS
  Administered 2019-05-12: 2 mg via INTRAVENOUS

## 2019-05-12 MED ORDER — CLEVIDIPINE BUTYRATE 0.5 MG/ML IV EMUL
INTRAVENOUS | Status: AC
  Filled 2019-05-12: qty 50

## 2019-05-12 MED ORDER — SODIUM CHLORIDE 0.9 % IV SOLN
INTRAVENOUS | Status: DC | PRN
  Administered 2019-05-12: 25 ug/min via INTRAVENOUS

## 2019-05-12 MED ORDER — LABETALOL HCL 5 MG/ML IV SOLN
INTRAVENOUS | Status: DC | PRN
  Administered 2019-05-12 (×4): 2.5 mg via INTRAVENOUS

## 2019-05-12 MED ORDER — PHENYLEPHRINE HCL (PRESSORS) 10 MG/ML IV SOLN
INTRAVENOUS | Status: DC | PRN
  Administered 2019-05-12 (×2): 80 ug via INTRAVENOUS

## 2019-05-12 MED ORDER — NIMODIPINE 30 MG PO CAPS: 0.0000 mg | ORAL_CAPSULE | ORAL | Status: DC

## 2019-05-12 MED ORDER — LACTATED RINGERS IV SOLN
INTRAVENOUS | Status: DC | PRN
  Administered 2019-05-12: 11:00:00 via INTRAVENOUS

## 2019-05-12 MED ORDER — CHLORHEXIDINE GLUCONATE CLOTH 2 % EX PADS
6.0000 | MEDICATED_PAD | Freq: Every day | CUTANEOUS | Status: DC
  Administered 2019-05-12 – 2019-05-13 (×2): 6 via TOPICAL

## 2019-05-12 MED ORDER — ONDANSETRON HCL 4 MG/2ML IJ SOLN: 4.0000 mg | Freq: Once | INTRAMUSCULAR | Status: DC | PRN

## 2019-05-12 NOTE — Procedures (Addendum)
S/P RT VA origin stent assisted angioplasty RT radial approach. Distal radial pulse dopplerable post procedure. Neurologically intact. S.Kayleah Appleyard MD

## 2019-05-12 NOTE — Anesthesia Procedure Notes (Signed)
Arterial Line Insertion Start/End6/01/2019 10:24 AM, 05/12/2019 10:30 AM Performed by: Laretta Alstrom, CRNA, CRNA  Patient location: Pre-op. Preanesthetic checklist: patient identified, IV checked, site marked, risks and benefits discussed, surgical consent, monitors and equipment checked, pre-op evaluation, timeout performed and anesthesia consent Lidocaine 1% used for infiltration Left, radial was placed Catheter size: 20 G Maximum sterile barriers used   Attempts: 1 Procedure performed without using ultrasound guided technique. Following insertion, dressing applied and Biopatch. Post procedure assessment: normal  Patient tolerated the procedure well with no immediate complications.

## 2019-05-12 NOTE — Progress Notes (Signed)
NIR.  Patient with history of right VA stenosis s/p stent assisted angioplasty today by Dr. Estanislado Pandy.  Patient awake and alert laying in bed. Complains of dysarthria- stable since prior to procedure. Denies headache, weakness, numbness/tingling, dizziness, vision changes, hearing changes, or tinnitus.  Alert, awake, and oriented x3. Follows commands, demonstrates dysarthria- stable since prior to procedure. PERRL bilaterally. No facial asymmetry. Tongue midline. Can spontaneously move all extremities. No pronator drift. Fine motor and coordination intact and symmetric. Distal pulses 2+ (both dorsalis pedis and radial). Right radial incision soft without active bleeding or hematoma.  Plan to stay in neuro ICU for overnight observation. Right radial incision stable. BP goal = [phone] systolic x 24 hours. Continue taking Brilinta 90 mg twice daily and Aspirin 81 mg once daily. Appreciate and agree with [facility] management. NIR to follow.  Jose Graff Louk, PA-C 05/12/2019, 2:52 PM

## 2019-05-12 NOTE — Progress Notes (Signed)
Patient arrived to unit 4N15 around 1650.  Patient is alert and oriented.  Pulses palatable and right radial site is level 0 with no hematoma or bleeding.  RN to continue to monitor.

## 2019-05-12 NOTE — Plan of Care (Signed)
Patient verbalized understanding the importance of a healthy well balanced diet and taking medications as prescribed by provider.

## 2019-05-12 NOTE — Progress Notes (Signed)
SLP Cancellation Note  Patient Details Name: Daylyn Azbill MRN: [id number] DOB: 05/15/1951   Cancelled treatment:       Reason Eval/Treat Not Completed: Patient at procedure or test/unavailable(Pt off unit at this time. SLP will re-attempt as able. )  Matti Minney I. Hardin Negus, [place], [place] Office number [phone] Pager [place] 05/12/2019, 3:05 PM

## 2019-05-12 NOTE — Sedation Documentation (Signed)
Bedside report given to Sol, RN, PACU. TR-Band clean and dry. Level 0. +1 right radial.

## 2019-05-12 NOTE — Progress Notes (Signed)
ANTICOAGULATION CONSULT NOTE - Follow Up Consult  Pharmacy Consult for heparin Indication: stent-assisted angioplasty   Labs: Recent Labs    05/12/19 2226  HEPARINUNFRC 0.11*    Assessment/Plan:  68yo male therapeutic on heparin with initial dosing for IR procedure. Will continue gtt at current rate.   Wynona Neat, PharmD, BCPS  05/12/2019,11:35 PM

## 2019-05-12 NOTE — Transfer of Care (Signed)
Immediate Anesthesia Transfer of Care Note  Patient: Jose Kaufman  Procedure(s) Performed: carotid stenting (N/A )  Patient Location: PACU  Anesthesia Type:MAC  Level of Consciousness: awake, alert , oriented and patient cooperative  Airway & Oxygen Therapy: Patient Spontanous Breathing and Patient connected to nasal cannula oxygen  Post-op Assessment: Report given to RN and Post -op Vital signs reviewed and stable  Post vital signs: Reviewed and stable  Last Vitals:  Vitals Value Taken Time  BP    Temp    Pulse 73 05/12/2019  1:37 PM  Resp 18 05/12/2019  1:37 PM  SpO2 99 % 05/12/2019  1:37 PM  Vitals shown include unvalidated device data.  Last Pain:  Vitals:   05/12/19 0800  TempSrc:   PainSc: 0-No pain         Complications: No apparent anesthesia complications

## 2019-05-12 NOTE — Progress Notes (Signed)
ANTICOAGULATION CONSULT NOTE - Initial Consult  Pharmacy Consult for heparin  Indication: Post Interventional Neuroradiology Procedure  Allergies  Allergen Reactions  . Penicillins Other (See Comments)    Childhood reaction. Has patient had a PCN reaction causing immediate rash, facial/tongue/throat swelling, SOB or lightheadedness with hypotension: unsure Has patient had a PCN reaction causing severe rash involving mucus membranes or skin necrosis: unsure Has patient had a PCN reaction that required hospitalization:No Has patient had a PCN reaction occurring within the last 10 years:No If all of the above answers are "NO", then may proceed with Cephalosporin use    Patient Measurements: Height: 5' 7.99" (172.7 cm) Weight: 167 lb 12.3 oz (76.1 kg) IBW/kg (Calculated) : 68.38   Vital Signs: Temp: 97.1 F (36.2 C) (06/02 1305) Temp Source: Oral (06/02 0746) BP: 129/88 (06/02 0746) Pulse Rate: 73 (06/02 1415)  Labs: No results for input(s): HGB, HCT, PLT, APTT, LABPROT, INR, HEPARINUNFRC, HEPRLOWMOCWT, CREATININE, CKTOTAL, CKMB, TROPONINI in the last 72 hours.  Estimated Creatinine Clearance: 55.7 mL/min (A) (by C-G formula based on SCr of 1.28 mg/dL (H)).   Medical History: Past Medical History:  Diagnosis Date  . Abnormality of gait 10/15/2016  . Arthritis   . Dysrhythmia    hx of  . Essential hypertension 03/14/2017  . PVCs (premature ventricular contractions) 03/14/2017    Medications:  Medications Prior to Admission  Medication Sig Dispense Refill Last Dose  . acetaminophen (TYLENOL) 325 MG tablet Take 650 mg by mouth every 6 (six) hours as needed for mild pain.    Past Week at Unknown time  . aspirin EC 81 MG tablet Take 81 mg by mouth daily.   05/04/2019 at Unknown time  . ferrous sulfate 325 (65 FE) MG tablet Take 325 mg by mouth 2 (two) times a week.   Past Week at Unknown time  . lisinopril-hydrochlorothiazide (PRINZIDE,ZESTORETIC) 10-12.5 MG tablet Take 1 tablet  by mouth daily.   05/05/2019 at Unknown time  . Omega-3 Fatty Acids (FISH OIL PO) Take 1 capsule by mouth every evening.    05/04/2019 at Unknown time  . thiamine (VITAMIN B-1) 100 MG tablet Take 100 mg by mouth 2 (two) times a week.   Past Week at Unknown time   Scheduled:  . [MAR Hold] aspirin  81 mg Oral Daily  . [MAR Hold] atorvastatin  80 mg Oral q1800  . lidocaine      . nitroGLYCERIN      . [MAR Hold] ticagrelor  90 mg Oral BID  . verapamil        Assessment: 68 yo male with proximal right ICA and VA stenosis s/p stent assisted angioplasty. Pharmacy consulted to dose heparin (currenly at 500 units/hr)   Goal of Therapy:  Heparin level 0.1-0.25 units/ml Monitor platelets by anticoagulation protocol: Yes   Plan:  -Increase heparin to 700 units/hr (~ 9 units/kg/hr) -Heparin level in 6 hours and daily wth CBC daily -heparin to be off at 8am on 6/3  Hildred Laser, PharmD Clinical Pharmacist **Pharmacist phone directory can now be found on [email or web address] ([email or web address]).  Listed under [place].

## 2019-05-12 NOTE — Progress Notes (Signed)
Dr. Estanislado Pandy at bedside. Advised not to give ordered PO medications.

## 2019-05-12 NOTE — Progress Notes (Signed)
Spoke with Jasmine in IR and they stated that Dr. Estanislado Pandy was aware of P2Y12 and is ok with same.

## 2019-05-13 ENCOUNTER — Encounter (HOSPITAL_COMMUNITY): Payer: Self-pay | Admitting: Interventional Radiology

## 2019-05-13 LAB — CBC WITH DIFFERENTIAL/PLATELET
Abs Immature Granulocytes: 0.05 10*3/uL (ref 0.00–0.07)
Basophils Absolute: 0.1 10*3/uL (ref 0.0–0.1)
Basophils Relative: 1 %
Eosinophils Absolute: 0.2 10*3/uL (ref 0.0–0.5)
Eosinophils Relative: 2 %
HCT: 39.3 % (ref 39.0–52.0)
Hemoglobin: 13.3 g/dL (ref 13.0–17.0)
Immature Granulocytes: 1 %
Lymphocytes Relative: 10 %
Lymphs Abs: 0.9 10*3/uL (ref 0.7–4.0)
MCH: 30.8 pg (ref 26.0–34.0)
MCHC: 33.8 g/dL (ref 30.0–36.0)
MCV: 91 fL (ref 80.0–100.0)
Monocytes Absolute: 0.5 10*3/uL (ref 0.1–1.0)
Monocytes Relative: 6 %
Neutro Abs: 7.7 10*3/uL (ref 1.7–7.7)
Neutrophils Relative %: 80 %
Platelets: 215 10*3/uL (ref 150–400)
RBC: 4.32 MIL/uL (ref 4.22–5.81)
RDW: 12.5 % (ref 11.5–15.5)
WBC: 9.5 10*3/uL (ref 4.0–10.5)
nRBC: 0 % (ref 0.0–0.2)

## 2019-05-13 LAB — BASIC METABOLIC PANEL
Anion gap: 9 (ref 5–15)
BUN: 15 mg/dL (ref 8–23)
CO2: 18 mmol/L — ABNORMAL LOW (ref 22–32)
Calcium: 8.3 mg/dL — ABNORMAL LOW (ref 8.9–10.3)
Chloride: 111 mmol/L (ref 98–111)
Creatinine, Ser: 1.12 mg/dL (ref 0.61–1.24)
GFR calc Af Amer: >60 mL/min (ref >60)
GFR calc non Af Amer: >60 mL/min (ref >60)
Glucose, Bld: 91 mg/dL (ref 70–99)
Potassium: 3.7 mmol/L (ref 3.5–5.1)
Sodium: 138 mmol/L (ref 135–145)

## 2019-05-13 LAB — HEPARIN LEVEL (UNFRACTIONATED): Heparin Unfractionated: <0.1 IU/mL — ABNORMAL LOW (ref 0.30–0.70)

## 2019-05-13 MED ORDER — ATORVASTATIN CALCIUM 80 MG PO TABS: 80.0000 mg | ORAL_TABLET | Freq: Every day | ORAL | Status: DC

## 2019-05-13 MED ORDER — TICAGRELOR 90 MG PO TABS: 90.0000 mg | ORAL_TABLET | Freq: Two times a day (BID) | ORAL | Status: DC

## 2019-05-13 NOTE — Progress Notes (Signed)
Referring Physician(s): Forde Dandy  Supervising Physician: [name]  Patient Status:  [facility] - In-pt  Chief Complaint: "Speech"  Subjective:  Right VA stenosis s/p stent assisted angioplasty 05/12/2019 by Dr. Estanislado Pandy. Patient awake and alert laying in bed. Complains of slurred speech- stable since admission. Right radial incision c/d/i.   Allergies: Penicillins  Medications: Prior to Admission medications   Medication Sig Start Date End Date Taking? Authorizing Provider  acetaminophen (TYLENOL) 325 MG tablet Take 650 mg by mouth every 6 (six) hours as needed for mild pain.    Yes [provider]  aspirin EC 81 MG tablet Take 81 mg by mouth daily.   Yes [provider]  ferrous sulfate 325 (65 FE) MG tablet Take 325 mg by mouth 2 (two) times a week.   Yes [provider]  lisinopril-hydrochlorothiazide (PRINZIDE,ZESTORETIC) 10-12.5 MG tablet Take 1 tablet by mouth daily.   Yes [provider]  Omega-3 Fatty Acids (FISH OIL PO) Take 1 capsule by mouth every evening.    Yes [provider]  thiamine (VITAMIN B-1) 100 MG tablet Take 100 mg by mouth 2 (two) times a week.   Yes [provider]     Vital Signs: BP 136/78    Pulse 77    Temp 97.8 F (36.6 C) (Oral)    Resp 14    Ht 5' 7.99" (1.727 m)    Wt 167 lb 12.3 oz (76.1 kg)    SpO2 98%    BMI 25.52 kg/m   Physical Exam Vitals signs and nursing note reviewed.  Constitutional:      General: He is not in acute distress.    Appearance: Normal appearance.  Pulmonary:     Effort: Pulmonary effort is normal. No respiratory distress.  Skin:    General: Skin is warm and dry.     Comments: Right radial incision soft without active bleeding or hematoma.  Neurological:     Mental Status: He is alert.     Comments: Alert, awake, and oriented x3. Follows commands, demonstrates dysarthria- stable since prior to procedure. PERRL bilaterally. No facial  asymmetry. Tongue midline. Can spontaneously move all extremities. No pronator drift. Fine motor and coordination intact and symmetric. Distal pulses 2+ (both dorsalis pedis and radial).  Psychiatric:        Mood and Affect: Mood normal.        Behavior: Behavior normal.        Thought Content: Thought content normal.        Judgment: Judgment normal.     Imaging: No results found.  Labs:  CBC: Recent Labs    05/05/19 0825 05/08/19 0459 05/13/19 0640  WBC 8.6 6.8 9.5  HGB 15.4 13.7 13.3  HCT 46.4 40.1 39.3  PLT 216 208 215    COAGS: Recent Labs    05/05/19 0825  INR 1.1  APTT 34    BMP: Recent Labs    05/06/19 1132 05/07/19 0635 05/08/19 0459 05/13/19 0640  NA 137 140 140 138  K 4.5 4.4 4.0 3.7  CL 104 105 109 111  CO2 23 27 23  18*  GLUCOSE 101* 98 107* 91  BUN 28* 25* 21 15  CALCIUM 9.3 9.1 8.7* 8.3*  CREATININE 1.44* 1.35* 1.28* 1.12  GFRNONAA 51* 55* 58* >60  GFRAA 59* >60 >60 >60    LIVER FUNCTION TESTS: Recent Labs    05/05/19 0825  BILITOT 0.8  AST 19  ALT 20  ALKPHOS 74  PROT 8.0  ALBUMIN 4.7    Assessment and Plan:  Right VA stenosis s/p stent assisted angioplasty 05/12/2019 by Dr. Estanislado Pandy. Patient's condition stable- still with dysarthria. Right radial incision stable. Continue taking Brilinta 90 mg twice daily and Aspirin 81 mg once daily. Plan to follow-up in 4 weeks for revascularization of left VA stenosis versus right ICA stenosis- patient aware that our schedulers will call him to set up this procedure. Appreciate and agree with [facility] management- from NIR/procedure standpoint, patient ready for discharge. Please call IR with questions/concerns.   Electronically Signed: Earley Abide, PA-C 05/13/2019, 9:50 AM   I spent a total of 25 Minutes at the the patient's bedside AND on the patient's hospital floor or unit, greater than 50% of which was counseling/coordinating care for right VA stenosis s/p revascularization.

## 2019-05-13 NOTE — Progress Notes (Signed)
[facility] PROGRESS NOTE  Jose Kaufman SVX:[id number] DOB: 1951/11/30 DOA: 05/05/2019 PCP: Michel Harrow, PA-C  Assessment/Plan: 1. Watershed left hemispheric infarct. In setting of dehydration with associated hypotension.  Imaging shows near occlusive extracranial carotid stenosis, aspirin and plavix was discontinued for aspirin and brilinta per IR prior to procedure, Currently meets criteria for CIR per PT/OT eval.   2. Proximal right ICA and VA stenosis.  S/p angioplasty by IR ( dr. Estanislado Pandy) on 6/2. Plan to monitor in ICU overnight after discussion with IR PA, getting heparin infusion post procedure. continue aspirin and Brilinta. BP goal 120-140 SBP x 24 hours  3. Hypertension, SBP in 140s-160s.    D/c IVF. Monitor. Want to avoid hypotension given watershed infarcts. Previously holding home BP meds given normotensive  4. HLD, stable.  Started on Lipitor, LDL 113.  Code Status: Full code Family Communication: No family at bedside (indicate person spoken with, relationship, and if by phone, the number) Disposition Plan: s/p stent assisted angioplasty on 6/2, monitor in ICU on post procedure heparin , PT/OT recommends CIR will need to initiate insurance process after monitoring for recent procedure   Consultants:  Neurology, IR  Procedures: Cerebral angiogram, 5/28: Findings. 1.Occluded left ICA prox. 2.Severe preocclusive stenosis of right ICA prox.  3. 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 year old male with medical history significant for HTN, HLD who presented on 05/05/2019 with slurred speech, difficulty with comprehension and word finding and lower extremity weakness that he noticed on 5/23 normal in the yard which he initially attributed to "heat stroke" and was found to have acute infarct of left frontoparietal  brain as well as old cortical/subcortical infarcts.  No acute events overnight Did not see patient as he was gone for procedure this am   Objective: Vitals:   05/12/19 2200 05/13/19 0000  BP: (!) 131/54   Pulse: 64   Resp: 19   Temp:  98.5 F (36.9 C)  SpO2: 96%     Intake/Output Summary (Last 24 hours) at 05/13/2019 0022 Last data filed at 05/12/2019 2200 Gross per 24 hour  Intake 2785.44 ml  Output 1100 ml  Net 1685.44 ml   Filed Weights   05/05/19 0825 05/05/19 2144 05/12/19 0838  Weight: 77.1 kg 76.1 kg 76.1 kg    Exam:  Did not see patient as he was gone for procedure this am  Data Reviewed: Basic Metabolic Panel: Recent Labs  Lab 05/06/19 1132 05/07/19 0635 05/08/19 0459  NA 137 140 140  K 4.5 4.4 4.0  CL 104 105 109  CO2 23 27 23   GLUCOSE 101* 98 107*  BUN 28* 25* 21  CREATININE 1.44* 1.35* 1.28*  CALCIUM 9.3 9.1 8.7*   Liver Function Tests: No results for input(s): AST, ALT, ALKPHOS, BILITOT, PROT, ALBUMIN in the last 168 hours. No results for input(s): LIPASE, AMYLASE in the last 168 hours. No results for input(s): AMMONIA in the last 168 hours. CBC: Recent Labs  Lab 05/08/19 0459  WBC 6.8  NEUTROABS 4.9  HGB 13.7  HCT 40.1  MCV 91.6  PLT 208   Cardiac Enzymes: Recent Labs  Lab 05/06/19 1132  CKTOTAL 151   BNP (last 3 results) No results for input(s): BNP in the last 8760 hours.  ProBNP (last 3 results) No results for input(s): PROBNP in the last 8760 hours.  CBG: No results for input(s): GLUCAP in the last 168 hours.  Recent Results (from the past 240 hour(s))  SARS Coronavirus 2 (CEPHEID - Performed in [place] hospital lab), Hosp Order     Status: None   Collection Time: 05/05/19  8:59 AM  Result Value Ref Range Status   SARS Coronavirus 2 NEGATIVE NEGATIVE Final    Comment: (NOTE) If result is NEGATIVE SARS-CoV-2 target nucleic acids are NOT DETECTED. The SARS-CoV-2 RNA is generally detectable in upper and lower   respiratory specimens during the acute phase of infection. The lowest  concentration of SARS-CoV-2 viral copies this assay can detect is 250  copies / mL. A negative result does not preclude SARS-CoV-2 infection  and should not be used as the sole basis for treatment or other  patient management decisions.  A negative result may occur with  improper specimen collection / handling, submission of specimen other  than nasopharyngeal swab, presence of viral mutation(s) within the  areas targeted by this assay, and inadequate number of viral copies  (<250 copies / mL). A negative result must be combined with clinical  observations, patient history, and epidemiological information. If result is POSITIVE SARS-CoV-2 target nucleic acids are DETECTED. The SARS-CoV-2 RNA is generally detectable in upper and lower  respiratory specimens dur ing the acute phase of infection.  Positive  results are indicative of active infection with SARS-CoV-2.  Clinical  correlation with patient history and other diagnostic information is  necessary to determine patient infection status.  Positive results do  not rule out bacterial infection or co-infection with other viruses. If result is PRESUMPTIVE POSTIVE SARS-CoV-2 nucleic acids MAY BE PRESENT.   A presumptive positive result was obtained on the submitted specimen  and confirmed on repeat testing.  While 2019 novel coronavirus  (SARS-CoV-2) nucleic acids may be present in the submitted sample  additional confirmatory testing may be necessary for epidemiological  and / or clinical management purposes  to differentiate between  SARS-CoV-2 and other Sarbecovirus currently known to infect humans.  If clinically indicated additional testing with an alternate test  methodology [phone]) is advised. The SARS-CoV-2 RNA is generally  detectable in upper and lower respiratory sp ecimens during the acute  phase of infection. The expected result is Negative. Fact  Sheet for Patients:  StrictlyIdeas.no Fact Sheet for Healthcare Providers: BankingDealers.co.za This test is not yet approved or cleared by the [place] FDA and has been authorized for detection and/or diagnosis of SARS-CoV-2 by FDA under an Emergency Use Authorization (EUA).  This EUA will remain in effect (meaning this test can be used) for the duration of the COVID-19 declaration under Section 564(b)(1) of the Act, 21 U.S.C. section 360bbb-3(b)(1), unless the authorization is terminated or revoked sooner. Performed at [facility], [place] [address] [place], [place] [address]   SARS Coronavirus 2 (CEPHEID - Performed in [place] hospital lab), Hosp Order     Status: None   Collection Time: 05/08/19  8:24 AM  Result Value Ref Range Status   SARS Coronavirus 2 NEGATIVE NEGATIVE Final    Comment: (NOTE) If result is NEGATIVE SARS-CoV-2 target nucleic acids are NOT DETECTED. The SARS-CoV-2 RNA is generally detectable in upper and lower  respiratory specimens during the acute phase of infection. The lowest  concentration of SARS-CoV-2 viral copies this assay can detect is 250  copies / mL. A negative result does not preclude SARS-CoV-2 infection  and should not be used as the sole basis  for treatment or other  patient management decisions.  A negative result may occur with  improper specimen collection / handling, submission of specimen other  than nasopharyngeal swab, presence of viral mutation(s) within the  areas targeted by this assay, and inadequate number of viral copies  (<250 copies / mL). A negative result must be combined with clinical  observations, patient history, and epidemiological information. If result is POSITIVE SARS-CoV-2 target nucleic acids are DETECTED. The SARS-CoV-2 RNA is generally detectable in upper and lower  respiratory specimens dur ing the acute phase of infection.  Positive   results are indicative of active infection with SARS-CoV-2.  Clinical  correlation with patient history and other diagnostic information is  necessary to determine patient infection status.  Positive results do  not rule out bacterial infection or co-infection with other viruses. If result is PRESUMPTIVE POSTIVE SARS-CoV-2 nucleic acids MAY BE PRESENT.   A presumptive positive result was obtained on the submitted specimen  and confirmed on repeat testing.  While 2019 novel coronavirus  (SARS-CoV-2) nucleic acids may be present in the submitted sample  additional confirmatory testing may be necessary for epidemiological  and / or clinical management purposes  to differentiate between  SARS-CoV-2 and other Sarbecovirus currently known to infect humans.  If clinically indicated additional testing with an alternate test  methodology [phone]) is advised. The SARS-CoV-2 RNA is generally  detectable in upper and lower respiratory sp ecimens during the acute  phase of infection. The expected result is Negative. Fact Sheet for Patients:  StrictlyIdeas.no Fact Sheet for Healthcare Providers: BankingDealers.co.za This test is not yet approved or cleared by the [place] FDA and has been authorized for detection and/or diagnosis of SARS-CoV-2 by FDA under an Emergency Use Authorization (EUA).  This EUA will remain in effect (meaning this test can be used) for the duration of the COVID-19 declaration under Section 564(b)(1) of the Act, 21 U.S.C. section 360bbb-3(b)(1), unless the authorization is terminated or revoked sooner. Performed at [facility], [place] [address], [place], [place] [address]   SARS Coronavirus 2 (CEPHEID - Performed in [place] hospital lab), Hosp Order     Status: None   Collection Time: 05/11/19  1:37 PM  Result Value Ref Range Status   SARS Coronavirus 2 NEGATIVE NEGATIVE Final    Comment: (NOTE) If result is  NEGATIVE SARS-CoV-2 target nucleic acids are NOT DETECTED. The SARS-CoV-2 RNA is generally detectable in upper and lower  respiratory specimens during the acute phase of infection. The lowest  concentration of SARS-CoV-2 viral copies this assay can detect is 250  copies / mL. A negative result does not preclude SARS-CoV-2 infection  and should not be used as the sole basis for treatment or other  patient management decisions.  A negative result may occur with  improper specimen collection / handling, submission of specimen other  than nasopharyngeal swab, presence of viral mutation(s) within the  areas targeted by this assay, and inadequate number of viral copies  (<250 copies / mL). A negative result must be combined with clinical  observations, patient history, and epidemiological information. If result is POSITIVE SARS-CoV-2 target nucleic acids are DETECTED. The SARS-CoV-2 RNA is generally detectable in upper and lower  respiratory specimens dur ing the acute phase of infection.  Positive  results are indicative of active infection with SARS-CoV-2.  Clinical  correlation with patient history and other diagnostic information is  necessary to determine patient infection status.  Positive results do  not rule out bacterial infection or co-infection with other viruses. If result is PRESUMPTIVE POSTIVE SARS-CoV-2 nucleic acids MAY BE PRESENT.   A presumptive positive result was obtained on the submitted specimen  and confirmed on repeat testing.  While 2019 novel coronavirus  (SARS-CoV-2) nucleic acids may be present in the submitted sample  additional confirmatory testing may be necessary for epidemiological  and / or clinical management purposes  to differentiate between  SARS-CoV-2 and other Sarbecovirus currently known to infect humans.  If clinically indicated additional testing with an alternate test  methodology [phone]) is advised. The SARS-CoV-2 RNA is generally  detectable  in upper and lower respiratory sp ecimens during the acute  phase of infection. The expected result is Negative. Fact Sheet for Patients:  StrictlyIdeas.no Fact Sheet for Healthcare Providers: BankingDealers.co.za This test is not yet approved or cleared by the [place] FDA and has been authorized for detection and/or diagnosis of SARS-CoV-2 by FDA under an Emergency Use Authorization (EUA).  This EUA will remain in effect (meaning this test can be used) for the duration of the COVID-19 declaration under Section 564(b)(1) of the Act, 21 U.S.C. section 360bbb-3(b)(1), unless the authorization is terminated or revoked sooner. Performed at [facility], [place] [address] [place], [place] [address]   MRSA PCR Screening     Status: None   Collection Time: 05/11/19 11:50 PM  Result Value Ref Range Status   MRSA by PCR NEGATIVE NEGATIVE Final    Comment:        The GeneXpert MRSA Assay (FDA approved for NASAL specimens only), is one component of a comprehensive MRSA colonization surveillance program. It is not intended to diagnose MRSA infection nor to guide or monitor treatment for MRSA infections. Performed at [facility], [place] [address], [place], [place] [address]      Studies: No results found.  Scheduled Meds: . aspirin  81 mg Oral Daily   Or  . aspirin  81 mg Per Tube Daily  . atorvastatin  80 mg Oral q1800  . Chlorhexidine Gluconate Cloth  6 each Topical Daily  . ticagrelor  90 mg Oral BID   Or  . ticagrelor  90 mg Per Tube BID   Continuous Infusions: . sodium chloride 75 mL/hr at 05/12/19 2200  . clevidipine    . clevidipine    . heparin 700 Units/hr (05/12/19 2200)    Active Problems:   Essential hypertension   CVA (cerebral vascular accident) ([place])   Internal carotid artery occlusion, bilateral   Hyperlipidemia   Vertebral artery narrowing, right      Jose Kaufman  [facility]

## 2019-05-13 NOTE — Progress Notes (Signed)
Occupational Therapy Treatment Patient Details Name: Jose Kaufman MRN: [id number] DOB: 04-Oct-1951 Today's Date: 05/13/2019    History of present illness Pt is a 68 y/o male who presented from [facility] ED with aphasia and confusion. MRI revealed old cortical and subcortical infarcts in both frontal and parietal regions, as well as areas of acute infarction in the L frontal and parietal lobes. PMH significant for PVC's, HTN, bilateral THR.   OT comments  Pt presents sitting up in recliner, agreeable to therapy session. Pt continues to present with impaired cognition including slower processing, trouble problem solving and sequencing. Pt performing room level functional mobility using RW, overall with minguard assist and verbal cues for increasing step length as pt noted to take smaller, shuffling steps. Pt provided with x3 grooming ADL to perform at sink; requires increased time to properly sequence through ADL tasks, but given added time and effort pt is able to perform without cues, requires minguard assist for standing balance. Pt also requiring increased time/effort for tasks due to impaired coordination bil UEs (RUE>LUE). Given pt's PLOF feel he remains appropriate for CIR level services at time of discharge to further address current deficits; suspect he will be able to reach mod independent level given intensive therapies. Will continue to follow while he remains in acute setting.    Follow Up Recommendations  CIR    Equipment Recommendations  Other (comment)(defer to next venue )    Recommendations for Other Services      Precautions / Restrictions Precautions Precautions: Fall Restrictions Weight Bearing Restrictions: No       Mobility Bed Mobility               General bed mobility comments: Pt received OOB in recliner  Transfers Overall transfer level: Needs assistance Equipment used: Rolling walker (2 wheeled) Transfers: Sit to/from Stand Sit to Stand: Min guard          General transfer comment: continues for hand placement; min gaurd for inital balance     Balance Overall balance assessment: Needs assistance Sitting-balance support: Feet supported;No upper extremity supported Sitting balance-Leahy Scale: Good     Standing balance support: No upper extremity supported;During functional activity Standing balance-Leahy Scale: Fair Standing balance comment: min guard for safety and balance                           ADL either performed or assessed with clinical judgement   ADL Overall ADL's : Needs assistance/impaired     Grooming: Wash/dry face;Oral care;Brushing hair;Min guard;Standing Grooming Details (indicate cue type and reason): Pt requires increased time to sequence through tasks, but is able to do so without cues given added time; minguard for standing balance                             Functional mobility during ADLs: Min guard;Rolling walker General ADL Comments: pt requires cues to take larger steps with walker; intermittent cues for following through with mobility tasks, initially upon entering bathroom pt started walking towards toilet, but then self corrected to turn towards sink for grooming ADL (pt already reporting he did not need to use bathroom prior to entry)      Vision       Perception     Praxis      Cognition Arousal/Alertness: Awake/alert Behavior During Therapy: WFL for tasks assessed/performed Overall Cognitive Status: Impaired/Different from baseline Area of Impairment:  Attention;Memory;Problem solving;Awareness;Following commands                   Current Attention Level: Selective Memory: Decreased short-term memory Following Commands: Follows multi-step commands with increased time Safety/Judgement: Decreased awareness of deficits Awareness: Emergent Problem Solving: Slow processing;Difficulty sequencing;Requires verbal cues General Comments: pt noted to have increased  difficulty sequencing through grooming tasks, requires increased time to perform, min cues for safety; pt also with decreased insight into current deficits, verbalizing his only deficits were speech related        Exercises     Shoulder Instructions       General Comments VSS    Pertinent Vitals/ Pain       Pain Assessment: No/denies pain  Home Living                                          Prior Functioning/Environment              Frequency  Min 3X/week        Progress Toward Goals  OT Goals(current goals can now be found in the care plan section)  Progress towards OT goals: Progressing toward goals  Acute Rehab OT Goals Patient Stated Goal: still agreeable to rehab OT Goal Formulation: With patient Time For Goal Achievement: 05/20/19 Potential to Achieve Goals: Good ADL Goals Pt Will Perform Grooming: with modified independence;standing Pt Will Perform Lower Body Dressing: with modified independence;sit to/from stand Pt Will Transfer to Toilet: with modified independence;ambulating;regular height toilet Pt Will Perform Toileting - Clothing Manipulation and hygiene: with modified independence;sit to/from stand Pt Will Perform Tub/Shower Transfer: with modified independence;Shower transfer;ambulating Additional ADL Goal #1: Pt will identify and gather items necessary for ADL around his room modified independently.  Plan Discharge plan remains appropriate;Frequency remains appropriate    Co-evaluation                 AM-PAC OT "6 Clicks" Daily Activity     Outcome Measure   Help from another person eating meals?: None Help from another person taking care of personal grooming?: A Little Help from another person toileting, which includes using toliet, bedpan, or urinal?: A Little Help from another person bathing (including washing, rinsing, drying)?: A Little Help from another person to put on and taking off regular upper body  clothing?: A Little Help from another person to put on and taking off regular lower body clothing?: A Little 6 Click Score: 19    End of Session Equipment Utilized During Treatment: Rolling walker;Gait belt  OT Visit Diagnosis: Unsteadiness on feet (R26.81);Other abnormalities of gait and mobility (R26.89);Apraxia (R48.2);Other symptoms and signs involving cognitive function   Activity Tolerance Patient tolerated treatment well   Patient Left with call bell/phone within reach;in chair   Nurse Communication Mobility status        Time: 0962-8366 OT Time Calculation (min): 19 min  Charges: OT General Charges $OT Visit: 1 Visit OT Treatments $Self Care/Home Management : 8-22 mins  Lou Cal, [place] Pager [phone] Office [phone]   Raymondo Band 05/13/2019, 10:55 AM

## 2019-05-13 NOTE — Progress Notes (Signed)
Physical Therapy Treatment Patient Details Name: Jose Kaufman MRN: [id number] DOB: 01-Jan-1951 Today's Date: 05/13/2019    History of Present Illness Pt is a 68 y/o male who presented from [facility] ED with aphasia and confusion. MRI revealed old cortical and subcortical infarcts in both frontal and parietal regions, as well as areas of acute infarction in the L frontal and parietal lobes. PMH significant for PVC's, HTN, bilateral THR. S/p R ICA revascularization 6/3.    PT Comments    Pt reassessed s/p R ICA revascularization yesterday. Pt remains motivated to participate in therapy, main goal includes "working on my speech." Pt continues with dynamic balance impairments, decreased awareness, and decreased problem solving abilities. Pt ambulating limited hallway distances with min assist. Session focused on higher level balance activities I.e. head turns, stops/starts, stepping over obstacles. Pt requiring physical assistance for stability with challenge. Gait speed of 1.5 ft/s indicative that pt is at high risk for falls. Continue to recommend comprehensive inpatient rehab (CIR) for post-acute therapy needs.    Follow Up Recommendations  CIR;Supervision/Assistance - 24 hour     Equipment Recommendations  None recommended by PT    Recommendations for Other Services       Precautions / Restrictions Precautions Precautions: Fall Restrictions Weight Bearing Restrictions: No    Mobility  Bed Mobility Overal bed mobility: Needs Assistance Bed Mobility: Sit to Supine       Sit to supine: Supervision   General bed mobility comments: Pt received OOB in recliner  Transfers Overall transfer level: Needs assistance Equipment used: Rolling walker (2 wheeled) Transfers: Sit to/from Stand Sit to Stand: Min guard         General transfer comment: min guard for initial balance  Ambulation/Gait Ambulation/Gait assistance: Min assist Gait Distance (Feet): 200 Feet Assistive device:  None Gait Pattern/deviations: Step-through pattern;Decreased stride length;Narrow base of support;Shuffle Gait velocity: 1.5 ft/s Gait velocity interpretation: <1.8 ft/sec, indicate of risk for recurrent falls General Gait Details: Pt with shuffling gait pattern, decreased reciprocal arm swing. Lateral LOB with challenge requiring min assist to correct.    Stairs             Wheelchair Mobility    Modified Rankin (Stroke Patients Only) Modified Rankin (Stroke Patients Only) Pre-Morbid Rankin Score: No symptoms Modified Rankin: Moderately severe disability     Balance Overall balance assessment: Needs assistance Sitting-balance support: Feet supported;No upper extremity supported Sitting balance-Leahy Scale: Good     Standing balance support: No upper extremity supported;During functional activity Standing balance-Leahy Scale: Fair Standing balance comment: min guard for safety and balance                            Cognition Arousal/Alertness: Awake/alert Behavior During Therapy: WFL for tasks assessed/performed Overall Cognitive Status: Impaired/Different from baseline Area of Impairment: Attention;Memory;Problem solving;Awareness;Following commands                   Current Attention Level: Selective Memory: Decreased short-term memory Following Commands: Follows multi-step commands with increased time Safety/Judgement: Decreased awareness of deficits Awareness: Emergent Problem Solving: Slow processing;Difficulty sequencing;Requires verbal cues General Comments: pt noted to have increased difficulty sequencing through grooming tasks, requires increased time to perform, min cues for safety; pt also with decreased insight into current deficits, verbalizing his only deficits were speech related      Exercises      General Comments General comments (skin integrity, edema, etc.): VSS  Pertinent Vitals/Pain Pain Assessment: No/denies pain     Home Living                      Prior Function            PT Goals (current goals can now be found in the care plan section) Acute Rehab PT Goals Patient Stated Goal: still agreeable to rehab, "work on my speech and handwriting.": Potential to Achieve Goals: Good Progress towards PT goals: Progressing toward goals    Frequency    Min 4X/week      PT Plan Current plan remains appropriate    Co-evaluation              AM-PAC PT "6 Clicks" Mobility   Outcome Measure  Help needed turning from your back to your side while in a flat bed without using bedrails?: None Help needed moving from lying on your back to sitting on the side of a flat bed without using bedrails?: A Little Help needed moving to and from a bed to a chair (including a wheelchair)?: A Little Help needed standing up from a chair using your arms (e.g., wheelchair or bedside chair)?: A Little Help needed to walk in hospital room?: A Little Help needed climbing 3-5 steps with a railing? : A Little 6 Click Score: 19    End of Session Equipment Utilized During Treatment: Gait belt Activity Tolerance: Patient tolerated treatment well Patient left: in bed;with call bell/phone within reach;with bed alarm set Nurse Communication: Mobility status PT Visit Diagnosis: Other abnormalities of gait and mobility (R26.89);Other symptoms and signs involving the nervous system (R29.898);Apraxia (R48.2)     Time: 3748-2707 PT Time Calculation (min) (ACUTE ONLY): 18 min  Charges:  $Therapeutic Activity: 8-22 mins                     Ellamae Sia, PT, DPT Acute Rehabilitation Services Pager [phone] Office [phone]    Willy Eddy 05/13/2019, 11:06 AM

## 2019-05-13 NOTE — Anesthesia Postprocedure Evaluation (Signed)
Anesthesia Post Note  Patient: Jose Kaufman  Procedure(s) Performed: carotid stenting (N/A )     Patient location during evaluation: PACU Anesthesia Type: MAC Level of consciousness: awake and alert Pain management: pain level controlled Vital Signs Assessment: post-procedure vital signs reviewed and stable Respiratory status: spontaneous breathing, nonlabored ventilation, respiratory function stable and patient connected to nasal cannula oxygen Cardiovascular status: stable and blood pressure returned to baseline Postop Assessment: no apparent nausea or vomiting Anesthetic complications: no    Last Vitals:  Vitals:   05/13/19 0000 05/13/19 0400  BP:    Pulse:    Resp:    Temp: 36.9 C 36.8 C  SpO2:      Last Pain:  Vitals:   05/13/19 0400  TempSrc: Oral  PainSc:                  Ryan P Ellender

## 2019-05-13 NOTE — Discharge Summary (Signed)
Physician Discharge Summary  [facility] OAC:[id number] DOB: 1951-07-20 DOA: 05/05/2019  PCP: Michel Harrow, PA-C  Admit date: 05/05/2019 Discharge date: 05/14/2019  Admitted From:Home.  Disposition:  Inpatient rehab.   Recommendations for Outpatient Follow-up:  1. Follow up with PCP in 1-2 weeks 2. Please obtain BMP/CBC in one week Please follow up with neurology and neuro radiology as recommended.   Discharge Condition:Stable CODE STATUS: full code.  Diet recommendation: Heart Healthy /  Brief/Interim Summary: Jose Kaufman is a 68 y.o. year old male with medical history significant for HTN, HLD who presented on 05/05/2019 with slurred speech, difficulty with comprehension and word finding and lower extremity weakness that he noticed on 5/23 normal in the yard which he initially attributed to "heat stroke" and was found to have acute infarct of left frontoparietal brain as well as old cortical/subcortical infarcts. He underwent cerebral angiogram. Cerebral angiogram, 5/28: Findings. 1.Occluded left ICA prox. 2. Severe preocclusive stenosis of right ICA prox. 3. Severe preocclusive stenosis of bilateral vertebral arteries at the origins   Discharge Diagnoses:  Active Problems:   Essential hypertension   CVA (cerebral vascular accident) ([place])   Internal carotid artery occlusion, bilateral   Hyperlipidemia   Vertebral artery narrowing, right  1. Watershed left hemispheric infarct. In setting of dehydration with associated hypotension.  Imaging shows near occlusive extracranial carotid stenosis, . Plan to to continue aspirin and plavix,. IR consulted and he underwent endarterectomy.  Currently meets criteria for CIR per PT/OT eval.   2. Proximal right ICA and VA stenosis.  S/p angioplasty by IR ( dr. Estanislado Pandy) on 6/2.  continue aspirin and Brilinta.   3. Hypertension, SBP in 140s-160s.    Want to avoid hypotension given watershed infarcts. Previously holding home BP meds  given normotensive  4. HLD, stable.  Started on Lipitor, LDL 113.   Discharge Instructions   Allergies as of 05/13/2019      Reactions   Penicillins Other (See Comments)   Childhood reaction. Has patient had a PCN reaction causing immediate rash, facial/tongue/throat swelling, SOB or lightheadedness with hypotension: unsure Has patient had a PCN reaction causing severe rash involving mucus membranes or skin necrosis: unsure Has patient had a PCN reaction that required hospitalization:No Has patient had a PCN reaction occurring within the last 10 years:No If all of the above answers are "NO", then may proceed with Cephalosporin use      Medication List    TAKE these medications   acetaminophen 325 MG tablet Commonly known as:  TYLENOL Take 650 mg by mouth every 6 (six) hours as needed for mild pain.   aspirin EC 81 MG tablet Take 81 mg by mouth daily.   atorvastatin 80 MG tablet Commonly known as:  LIPITOR Take 1 tablet (80 mg total) by mouth daily at 6 PM.   ferrous sulfate 325 (65 FE) MG tablet Take 325 mg by mouth 2 (two) times a week.   FISH OIL PO Take 1 capsule by mouth every evening.   lisinopril-hydrochlorothiazide 10-12.5 MG tablet Commonly known as:  ZESTORETIC Take 1 tablet by mouth daily.   thiamine 100 MG tablet Commonly known as:  VITAMIN B-1 Take 100 mg by mouth 2 (two) times a week.   ticagrelor 90 MG Tabs tablet Commonly known as:  BRILINTA Take 1 tablet (90 mg total) by mouth 2 (two) times daily.       Allergies  Allergen Reactions  . Penicillins Other (See Comments)    Childhood reaction. Has patient  had a PCN reaction causing immediate rash, facial/tongue/throat swelling, SOB or lightheadedness with hypotension: unsure Has patient had a PCN reaction causing severe rash involving mucus membranes or skin necrosis: unsure Has patient had a PCN reaction that required hospitalization:No Has patient had a PCN reaction occurring within the last  10 years:No If all of the above answers are "NO", then may proceed with Cephalosporin use    Consultations:  IR  Neurology.    Procedures/Studies: Ct Head Wo Contrast  Result Date: 05/05/2019 CLINICAL DATA:  Confusion and speech disturbance beginning 2 days ago. EXAM: CT HEAD WITHOUT CONTRAST TECHNIQUE: Contiguous axial images were obtained from the base of the skull through the vertex without intravenous contrast. COMPARISON:  None. FINDINGS: Brain: No acute finding by CT. The brainstem and cerebellum appear normal. There are old appearing cortical and subcortical infarctions in the frontal and parietal regions of both hemispheres. None of these are discernibly recent. No mass lesion, hemorrhage, hydrocephalus or extra-axial collection. Vascular: No abnormal vascular finding. Skull: Negative Sinuses/Orbits: Scattered retention cysts. No sign of acute sinusitis. Orbits negative. Other: None IMPRESSION: No acute finding by CT. Old bilateral frontal and parietal cortical and subcortical infarctions. Electronically Signed   By: Nelson Chimes M.D.   On: 05/05/2019 09:48   Ct Angio Neck W Or Wo Contrast  Result Date: 05/06/2019 CLINICAL DATA:  Confusion. Speech disturbance. Old strokes. Follow-up acute strokes left hemisphere. EXAM: CT ANGIOGRAPHY NECK TECHNIQUE: Multidetector CT imaging of the neck was performed using the standard protocol during bolus administration of intravenous contrast. Multiplanar CT image reconstructions and MIPs were obtained to evaluate the vascular anatomy. Carotid stenosis measurements (when applicable) are obtained utilizing NASCET criteria, using the distal internal carotid diameter as the denominator. CONTRAST:  23mL OMNIPAQUE IOHEXOL 350 MG/ML SOLN COMPARISON:  MRI study yesterday. FINDINGS: Aortic arch: Aortic atherosclerosis. No evidence of dissection. Maximal diameter of the ascending aorta 3.7 cm. Right carotid system: Common carotid artery is widely patent to the  bifurcation. There is near occlusion of the ICA at the origin. There is a string sign through the region of the cervical internal carotid artery to the skull base. Left carotid system: Common carotid artery shows soft plaque with stenosis 40%. At the carotid bifurcation, there is extensive plaque with occlusion of the ICA. Vertebral arteries: Right subclavian artery is widely patent. There is atherosclerotic plaque at the right vertebral artery origin with stenosis estimated at 50%. Beyond that, the right vertebral artery is widely patent to the basilar. The left subclavian artery shows a 50% stenosis in its proximal 3 cm. There is atherosclerotic plaque at the left vertebral artery origin with stenosis of 50%. Beyond that, the left vertebral artery is patent to the basilar. Skeleton: Ordinary mid cervical spondylosis. Other neck: No mass or lymphadenopathy. Upper chest: Negative IMPRESSION: Advanced atherosclerotic disease at the carotid bifurcation on the right with near occlusion the ICA. There is a patent string sign seen to the level of the skull base. Advanced atherosclerotic disease at the carotid bifurcation on the left with occlusion the ICA. No string sign identified on this side. 50% stenosis at both vertebral artery origins. Long segment 50% stenosis affecting the proximal left subclavian artery. Electronically Signed   By: Nelson Chimes M.D.   On: 05/06/2019 16:18   Mr Brain Wo Contrast  Result Date: 05/05/2019 CLINICAL DATA:  Confusion. Speech disturbance. Old strokes. Assess for acute finding. Abnormal head CT. EXAM: MRI HEAD WITHOUT CONTRAST TECHNIQUE: Multiplanar, multiecho pulse sequences of the  brain and surrounding structures were obtained without intravenous contrast. COMPARISON:  Head CT same day FINDINGS: Brain: There is a background pattern of old cortical and subcortical infarctions and both frontal and parietal regions. There are scattered areas of acute infarction affecting the left  frontal and parietal cortical and subcortical brain consistent with embolic and or watershed infarctions on the left. No evidence of mass effect or hemorrhage. No acute infarctions seen on the right or in the posterior circulation brain. No hydrocephalus. No extra-axial collection. Vascular: There appears to be abnormal flow in both internal carotid arteries at the skull base level. These could be chronically occluded. Vertebrobasilar system does show flow. Skull and upper cervical spine: Negative Sinuses/Orbits: Retention cyst right maxillary sinus. Orbits negative. Other: None IMPRESSION: Old cortical and subcortical infarctions in both frontal and parietal regions. Areas of acute infarction in the left frontal and parietal brain without significant swelling or any acute hemorrhage. No antegrade flow is seen in either internal carotid artery and chronic occlusion is suspected. Posterior circulation does show patency. These acute infarctions could be embolic or watershed in nature. Electronically Signed   By: Nelson Chimes M.D.   On: 05/05/2019 12:00   Dg Chest Portable 1 View  Result Date: 05/05/2019 CLINICAL DATA:  Mental status changes EXAM: PORTABLE CHEST 1 VIEW COMPARISON:  05/03/2016 FINDINGS: Bibasilar opacities, likely atelectasis. Heart is normal size. No effusions. No acute bony abnormality. IMPRESSION: Bibasilar airspace opacities, atelectasis versus early infiltrates. Electronically Signed   By: Rolm Baptise M.D.   On: 05/05/2019 08:51   Ir Angio Intra Extracran Sel Com Carotid Innominate Bilat Mod Sed  Result Date: 05/08/2019 CLINICAL DATA:  New onset aphasia and mild right-sided weakness. Abnormal MRI of the brain. EXAM: IR ANGIO VERTEBRAL SEL VERTEBRAL BILAT MOD SED; BILATERAL COMMON CAROTID AND INNOMINATE ANGIOGRAPHY COMPARISON:  MRI of the brain of 05/05/2019 and CT angiogram of the head and neck of 05/06/2019. MEDICATIONS: Heparin 1000 units IV; no. The antibiotic was administered within 1  hour of the procedure. ANESTHESIA/SEDATION: Versed 1 mg IV; Fentanyl 25 mcg IV Moderate Sedation Time:  38 minutes The patient was continuously monitored during the procedure by the interventional radiology nurse under my direct supervision. CONTRAST:  Isovue 300 approximately 60 mL. FLUOROSCOPY TIME:  Fluoroscopy Time: 13 minutes 54 seconds (1365 mGy). COMPLICATIONS: None immediate. TECHNIQUE: Informed written consent was obtained from the patient after a thorough discussion of the procedural risks, benefits and alternatives. All questions were addressed. Maximal Sterile Barrier Technique was utilized including caps, mask, sterile gowns, sterile gloves, sterile drape, hand hygiene and skin antiseptic. A timeout was performed prior to the initiation of the procedure. The right groin was prepped and draped in the usual sterile fashion. Thereafter using modified Seldinger technique, transfemoral access into the right common femoral artery was obtained without difficulty. Over a 0.035 inch guidewire, a 5 French Pinnacle sheath was inserted. Through this, and also over 0.035 inch guidewire, a 5 [place] JB 1 catheter was advanced to the aortic arch region and selectively positioned in the right common carotid artery, right subclavian artery, the left common carotid artery and the left subclavian artery. FINDINGS: The right common carotid arteriogram demonstrates the right external carotid artery and its major branches to be widely patent. The right internal carotid artery at the bulb to the cranial skull base has a smooth string sign. The vessel is contiguous with the petrous cavernous junction of the right internal carotid artery. Distally the supraclinoid segment is widely patent.  Opacification is noted of the right middle cerebral artery and the right anterior cerebral artery into the capillary and venous phases. Patchy leptomeningeal and left pial collaterals seen applying the cortical surface of the parasagittal  regions of the right cerebral convexity. The right vertebral artery origin demonstrates severe pre occlusive stenosis. Antegrade flow is seen in the vertebral artery to the cranial skull base. Wide patency is seen of the right vertebrobasilar junction and the right posterior-inferior cerebellar artery. The opacified portion of the basilar artery, the posterior cerebral arteries, the superior cerebellar arteries and the anterior-inferior cerebellar arteries is grossly intact into the capillary and venous phases. Unopacified blood is seen in the basilar artery from the contralateral vertebral artery. There is retrograde opacification via a left posterior communicating artery of the left MCA distribution. Partial collateralization is also noted from leptomeningeal collaterals arising from the right posterior temporal branch of the right posterior cerebral artery. The left vertebral artery origin demonstrates severe pre occlusive stenosis. The vessel is seen to opacify to the cranial skull base. Patency is seen of the left posterior inferior cerebellar artery and the left vertebrobasilar junction. The basilar artery, the posterior cerebral arteries, the superior cerebellar arteries and the anterior-inferior cerebellar arteries opacify into the capillary and venous phases. Retrograde opacification via the left posterior communicating artery is seen of the left middle cerebral artery distribution. Also demonstrated are leptomeningeal collaterals arising from the right posterior cerebral artery posterior temporal branch supplying the cortical regions right temporal lobe and the right parietal lobe regions. The delayed arterial phase subsequently also demonstrates retrograde opacification of the anterior cerebral arteries bilaterally with subsequent retrograde opacification into the right MCA distribution cortical branches. The left common carotid arteriogram demonstrates nonvisualization of the left internal carotid artery  at the bulb or at the cranial skull base. There is no evidence of a angiographic string sign on the delayed arterial phase. Partial retrograde opacification of the left vertebral artery at the level of C1 is seen via the left occipital artery. Intracranially there is partial reconstitution of the left internal carotid artery cavernous segment and supraclinoid segment. Minimal flow is noted into the left MCA distribution. These are via the prominent anastomosis via the artery of the foramen of rotundum, and of the foramen ovale. IMPRESSION: Angiographically occluded left internal carotid artery extra cranially with partial reconstitution from the external carotid artery branches as described above. Severe high-grade stenosis of the right internal carotid artery proximally with delayed angiographic string sign being contiguous with the proximal cavernous segment of the right internal carotid artery. Severe high-grade pre occlusive stenosis of both vertebral artery origins. PLAN: Findings were discussed with the patient and the referring stroke neurologist. Electronically Signed   By: [name] M.D.   On: 05/07/2019 17:24   Ir Angio Vertebral Sel Subclavian Innominate Bilat Mod Sed  Result Date: 05/08/2019 CLINICAL DATA:  New onset aphasia and mild right-sided weakness. Abnormal MRI of the brain. EXAM: IR ANGIO VERTEBRAL SEL VERTEBRAL BILAT MOD SED; BILATERAL COMMON CAROTID AND INNOMINATE ANGIOGRAPHY COMPARISON:  MRI of the brain of 05/05/2019 and CT angiogram of the head and neck of 05/06/2019. MEDICATIONS: Heparin 1000 units IV; no. The antibiotic was administered within 1 hour of the procedure. ANESTHESIA/SEDATION: Versed 1 mg IV; Fentanyl 25 mcg IV Moderate Sedation Time:  38 minutes The patient was continuously monitored during the procedure by the interventional radiology nurse under my direct supervision. CONTRAST:  Isovue 300 approximately 60 mL. FLUOROSCOPY TIME:  Fluoroscopy Time: 13 minutes 54  seconds (1365 mGy). COMPLICATIONS: None immediate. TECHNIQUE: Informed written consent was obtained from the patient after a thorough discussion of the procedural risks, benefits and alternatives. All questions were addressed. Maximal Sterile Barrier Technique was utilized including caps, mask, sterile gowns, sterile gloves, sterile drape, hand hygiene and skin antiseptic. A timeout was performed prior to the initiation of the procedure. The right groin was prepped and draped in the usual sterile fashion. Thereafter using modified Seldinger technique, transfemoral access into the right common femoral artery was obtained without difficulty. Over a 0.035 inch guidewire, a 5 French Pinnacle sheath was inserted. Through this, and also over 0.035 inch guidewire, a 5 [place] JB 1 catheter was advanced to the aortic arch region and selectively positioned in the right common carotid artery, right subclavian artery, the left common carotid artery and the left subclavian artery. FINDINGS: The right common carotid arteriogram demonstrates the right external carotid artery and its major branches to be widely patent. The right internal carotid artery at the bulb to the cranial skull base has a smooth string sign. The vessel is contiguous with the petrous cavernous junction of the right internal carotid artery. Distally the supraclinoid segment is widely patent. Opacification is noted of the right middle cerebral artery and the right anterior cerebral artery into the capillary and venous phases. Patchy leptomeningeal and left pial collaterals seen applying the cortical surface of the parasagittal regions of the right cerebral convexity. The right vertebral artery origin demonstrates severe pre occlusive stenosis. Antegrade flow is seen in the vertebral artery to the cranial skull base. Wide patency is seen of the right vertebrobasilar junction and the right posterior-inferior cerebellar artery. The opacified portion of the basilar  artery, the posterior cerebral arteries, the superior cerebellar arteries and the anterior-inferior cerebellar arteries is grossly intact into the capillary and venous phases. Unopacified blood is seen in the basilar artery from the contralateral vertebral artery. There is retrograde opacification via a left posterior communicating artery of the left MCA distribution. Partial collateralization is also noted from leptomeningeal collaterals arising from the right posterior temporal branch of the right posterior cerebral artery. The left vertebral artery origin demonstrates severe pre occlusive stenosis. The vessel is seen to opacify to the cranial skull base. Patency is seen of the left posterior inferior cerebellar artery and the left vertebrobasilar junction. The basilar artery, the posterior cerebral arteries, the superior cerebellar arteries and the anterior-inferior cerebellar arteries opacify into the capillary and venous phases. Retrograde opacification via the left posterior communicating artery is seen of the left middle cerebral artery distribution. Also demonstrated are leptomeningeal collaterals arising from the right posterior cerebral artery posterior temporal branch supplying the cortical regions right temporal lobe and the right parietal lobe regions. The delayed arterial phase subsequently also demonstrates retrograde opacification of the anterior cerebral arteries bilaterally with subsequent retrograde opacification into the right MCA distribution cortical branches. The left common carotid arteriogram demonstrates nonvisualization of the left internal carotid artery at the bulb or at the cranial skull base. There is no evidence of a angiographic string sign on the delayed arterial phase. Partial retrograde opacification of the left vertebral artery at the level of C1 is seen via the left occipital artery. Intracranially there is partial reconstitution of the left internal carotid artery cavernous  segment and supraclinoid segment. Minimal flow is noted into the left MCA distribution. These are via the prominent anastomosis via the artery of the foramen of rotundum, and of the foramen ovale. IMPRESSION: Angiographically occluded left internal carotid  artery extra cranially with partial reconstitution from the external carotid artery branches as described above. Severe high-grade stenosis of the right internal carotid artery proximally with delayed angiographic string sign being contiguous with the proximal cavernous segment of the right internal carotid artery. Severe high-grade pre occlusive stenosis of both vertebral artery origins. PLAN: Findings were discussed with the patient and the referring stroke neurologist. Electronically Signed   By: [name] M.D.   On: 05/07/2019 17:24      Subjective: No chest pain or sob.   Discharge Exam: Vitals:   05/13/19 1400 05/13/19 1500  BP: (!) 142/72 111/85  Pulse: 78 77  Resp: 16   Temp:    SpO2: 99% 99%   Vitals:   05/13/19 1200 05/13/19 1300 05/13/19 1400 05/13/19 1500  BP: 139/74 108/86 (!) 142/72 111/85  Pulse: 84 80 78 77  Resp: 11 20 16    Temp: 98.3 F (36.8 C)     TempSrc: Oral     SpO2: 99% 98% 99% 99%  Weight:      Height:        General: Pt is alert, awake, not in acute distress Cardiovascular: RRR, S1/S2 +, no rubs, no gallops Respiratory: CTA bilaterally, no wheezing, no rhonchi Abdominal: Soft, NT, ND, bowel sounds + Extremities: no edema, no cyanosis    The results of significant diagnostics from this hospitalization (including imaging, microbiology, ancillary and laboratory) are listed below for reference.     Microbiology: Recent Results (from the past 240 hour(s))  SARS Coronavirus 2 (CEPHEID - Performed in [place] hospital lab), Hosp Order     Status: None   Collection Time: 05/05/19  8:59 AM  Result Value Ref Range Status   SARS Coronavirus 2 NEGATIVE NEGATIVE Final    Comment: (NOTE) If  result is NEGATIVE SARS-CoV-2 target nucleic acids are NOT DETECTED. The SARS-CoV-2 RNA is generally detectable in upper and lower  respiratory specimens during the acute phase of infection. The lowest  concentration of SARS-CoV-2 viral copies this assay can detect is 250  copies / mL. A negative result does not preclude SARS-CoV-2 infection  and should not be used as the sole basis for treatment or other  patient management decisions.  A negative result may occur with  improper specimen collection / handling, submission of specimen other  than nasopharyngeal swab, presence of viral mutation(s) within the  areas targeted by this assay, and inadequate number of viral copies  (<250 copies / mL). A negative result must be combined with clinical  observations, patient history, and epidemiological information. If result is POSITIVE SARS-CoV-2 target nucleic acids are DETECTED. The SARS-CoV-2 RNA is generally detectable in upper and lower  respiratory specimens dur ing the acute phase of infection.  Positive  results are indicative of active infection with SARS-CoV-2.  Clinical  correlation with patient history and other diagnostic information is  necessary to determine patient infection status.  Positive results do  not rule out bacterial infection or co-infection with other viruses. If result is PRESUMPTIVE POSTIVE SARS-CoV-2 nucleic acids MAY BE PRESENT.   A presumptive positive result was obtained on the submitted specimen  and confirmed on repeat testing.  While 2019 novel coronavirus  (SARS-CoV-2) nucleic acids may be present in the submitted sample  additional confirmatory testing may be necessary for epidemiological  and / or clinical management purposes  to differentiate between  SARS-CoV-2 and other Sarbecovirus currently known to infect humans.  If clinically indicated additional testing with an alternate  test  methodology [phone]) is advised. The SARS-CoV-2 RNA is generally   detectable in upper and lower respiratory sp ecimens during the acute  phase of infection. The expected result is Negative. Fact Sheet for Patients:  StrictlyIdeas.no Fact Sheet for Healthcare Providers: BankingDealers.co.za This test is not yet approved or cleared by the [place] FDA and has been authorized for detection and/or diagnosis of SARS-CoV-2 by FDA under an Emergency Use Authorization (EUA).  This EUA will remain in effect (meaning this test can be used) for the duration of the COVID-19 declaration under Section 564(b)(1) of the Act, 21 U.S.C. section 360bbb-3(b)(1), unless the authorization is terminated or revoked sooner. Performed at [facility], [place] [address], [place], [place] [id number]   SARS Coronavirus 2 (CEPHEID - Performed in [facility] hospital lab), Hosp Order     Status: None   Collection Time: 05/08/19  8:24 AM  Result Value Ref Range Status   SARS Coronavirus 2 NEGATIVE NEGATIVE Final    Comment: (NOTE) If result is NEGATIVE SARS-CoV-2 target nucleic acids are NOT DETECTED. The SARS-CoV-2 RNA is generally detectable in upper and lower  respiratory specimens during the acute phase of infection. The lowest  concentration of SARS-CoV-2 viral copies this assay can detect is 250  copies / mL. A negative result does not preclude SARS-CoV-2 infection  and should not be used as the sole basis for treatment or other  patient management decisions.  A negative result may occur with  improper specimen collection / handling, submission of specimen other  than nasopharyngeal swab, presence of viral mutation(s) within the  areas targeted by this assay, and inadequate number of viral copies  (<250 copies / mL). A negative result must be combined with clinical  observations, patient history, and epidemiological information. If result is POSITIVE SARS-CoV-2 target nucleic acids are DETECTED. The  SARS-CoV-2 RNA is generally detectable in upper and lower  respiratory specimens dur ing the acute phase of infection.  Positive  results are indicative of active infection with SARS-CoV-2.  Clinical  correlation with patient history and other diagnostic information is  necessary to determine patient infection status.  Positive results do  not rule out bacterial infection or co-infection with other viruses. If result is PRESUMPTIVE POSTIVE SARS-CoV-2 nucleic acids MAY BE PRESENT.   A presumptive positive result was obtained on the submitted specimen  and confirmed on repeat testing.  While 2019 novel coronavirus  (SARS-CoV-2) nucleic acids may be present in the submitted sample  additional confirmatory testing may be necessary for epidemiological  and / or clinical management purposes  to differentiate between  SARS-CoV-2 and other Sarbecovirus currently known to infect humans.  If clinically indicated additional testing with an alternate test  methodology [phone]) is advised. The SARS-CoV-2 RNA is generally  detectable in upper and lower respiratory sp ecimens during the acute  phase of infection. The expected result is Negative. Fact Sheet for Patients:  StrictlyIdeas.no Fact Sheet for Healthcare Providers: BankingDealers.co.za This test is not yet approved or cleared by the [place] FDA and has been authorized for detection and/or diagnosis of SARS-CoV-2 by FDA under an Emergency Use Authorization (EUA).  This EUA will remain in effect (meaning this test can be used) for the duration of the COVID-19 declaration under Section 564(b)(1) of the Act, 21 U.S.C. section 360bbb-3(b)(1), unless the authorization is terminated or revoked sooner. Performed at [facility], [place] [address], [place], [place] [address]   SARS Coronavirus 2 (CEPHEID -  Performed in [facility] hospital lab), Hosp Order     Status: None   Collection Time:  05/11/19  1:37 PM  Result Value Ref Range Status   SARS Coronavirus 2 NEGATIVE NEGATIVE Final    Comment: (NOTE) If result is NEGATIVE SARS-CoV-2 target nucleic acids are NOT DETECTED. The SARS-CoV-2 RNA is generally detectable in upper and lower  respiratory specimens during the acute phase of infection. The lowest  concentration of SARS-CoV-2 viral copies this assay can detect is 250  copies / mL. A negative result does not preclude SARS-CoV-2 infection  and should not be used as the sole basis for treatment or other  patient management decisions.  A negative result may occur with  improper specimen collection / handling, submission of specimen other  than nasopharyngeal swab, presence of viral mutation(s) within the  areas targeted by this assay, and inadequate number of viral copies  (<250 copies / mL). A negative result must be combined with clinical  observations, patient history, and epidemiological information. If result is POSITIVE SARS-CoV-2 target nucleic acids are DETECTED. The SARS-CoV-2 RNA is generally detectable in upper and lower  respiratory specimens dur ing the acute phase of infection.  Positive  results are indicative of active infection with SARS-CoV-2.  Clinical  correlation with patient history and other diagnostic information is  necessary to determine patient infection status.  Positive results do  not rule out bacterial infection or co-infection with other viruses. If result is PRESUMPTIVE POSTIVE SARS-CoV-2 nucleic acids MAY BE PRESENT.   A presumptive positive result was obtained on the submitted specimen  and confirmed on repeat testing.  While 2019 novel coronavirus  (SARS-CoV-2) nucleic acids may be present in the submitted sample  additional confirmatory testing may be necessary for epidemiological  and / or clinical management purposes  to differentiate between  SARS-CoV-2 and other Sarbecovirus currently known to infect humans.  If clinically  indicated additional testing with an alternate test  methodology [phone]) is advised. The SARS-CoV-2 RNA is generally  detectable in upper and lower respiratory sp ecimens during the acute  phase of infection. The expected result is Negative. Fact Sheet for Patients:  StrictlyIdeas.no Fact Sheet for Healthcare Providers: BankingDealers.co.za This test is not yet approved or cleared by the [place] FDA and has been authorized for detection and/or diagnosis of SARS-CoV-2 by FDA under an Emergency Use Authorization (EUA).  This EUA will remain in effect (meaning this test can be used) for the duration of the COVID-19 declaration under Section 564(b)(1) of the Act, 21 U.S.C. section 360bbb-3(b)(1), unless the authorization is terminated or revoked sooner. Performed at [facility], [place] [address], [place], [place] [address]   MRSA PCR Screening     Status: None   Collection Time: 05/11/19 11:50 PM  Result Value Ref Range Status   MRSA by PCR NEGATIVE NEGATIVE Final    Comment:        The GeneXpert MRSA Assay (FDA approved for NASAL specimens only), is one component of a comprehensive MRSA colonization surveillance program. It is not intended to diagnose MRSA infection nor to guide or monitor treatment for MRSA infections. Performed at [facility], [place] [address], [place], [place] [address]      Labs: BNP (last 3 results) No results for input(s): BNP in the last 8760 hours. Basic Metabolic Panel: Recent Labs  Lab 05/07/19 0635 05/08/19 0459 05/13/19 0640  NA 140 140 138  K 4.4 4.0 3.7  CL 105  109 111  CO2 27 23 18*  GLUCOSE 98 107* 91  BUN 25* 21 15  CREATININE 1.35* 1.28* 1.12  CALCIUM 9.1 8.7* 8.3*   Liver Function Tests: No results for input(s): AST, ALT, ALKPHOS, BILITOT, PROT, ALBUMIN in the last 168 hours. No results for input(s): LIPASE, AMYLASE in the last 168 hours. No results for input(s):  AMMONIA in the last 168 hours. CBC: Recent Labs  Lab 05/08/19 0459 05/13/19 0640  WBC 6.8 9.5  NEUTROABS 4.9 7.7  HGB 13.7 13.3  HCT 40.1 39.3  MCV 91.6 91.0  PLT 208 215   Cardiac Enzymes: No results for input(s): CKTOTAL, CKMB, CKMBINDEX, TROPONINI in the last 168 hours. BNP: Invalid input(s): POCBNP CBG: No results for input(s): GLUCAP in the last 168 hours. D-Dimer No results for input(s): DDIMER in the last 72 hours. Hgb A1c No results for input(s): HGBA1C in the last 72 hours. Lipid Profile No results for input(s): CHOL, HDL, LDLCALC, TRIG, CHOLHDL, LDLDIRECT in the last 72 hours. Thyroid function studies No results for input(s): TSH, T4TOTAL, T3FREE, THYROIDAB in the last 72 hours.  Invalid input(s): FREET3 Anemia work up No results for input(s): VITAMINB12, FOLATE, FERRITIN, TIBC, IRON, RETICCTPCT in the last 72 hours. Urinalysis    Component Value Date/Time   COLORURINE YELLOW 05/11/2019 1338   APPEARANCEUR CLEAR 05/11/2019 1338   LABSPEC 1.019 05/11/2019 1338   PHURINE 5.0 05/11/2019 1338   GLUCOSEU NEGATIVE 05/11/2019 1338   HGBUR SMALL (A) 05/11/2019 1338   BILIRUBINUR NEGATIVE 05/11/2019 1338   KETONESUR NEGATIVE 05/11/2019 1338   PROTEINUR NEGATIVE 05/11/2019 1338   NITRITE NEGATIVE 05/11/2019 1338   LEUKOCYTESUR NEGATIVE 05/11/2019 1338   Sepsis Labs Invalid input(s): PROCALCITONIN,  WBC,  LACTICIDVEN Microbiology Recent Results (from the past 240 hour(s))  SARS Coronavirus 2 (CEPHEID - Performed in [place] hospital lab), Hosp Order     Status: None   Collection Time: 05/05/19  8:59 AM  Result Value Ref Range Status   SARS Coronavirus 2 NEGATIVE NEGATIVE Final    Comment: (NOTE) If result is NEGATIVE SARS-CoV-2 target nucleic acids are NOT DETECTED. The SARS-CoV-2 RNA is generally detectable in upper and lower  respiratory specimens during the acute phase of infection. The lowest  concentration of SARS-CoV-2 viral copies this assay can  detect is 250  copies / mL. A negative result does not preclude SARS-CoV-2 infection  and should not be used as the sole basis for treatment or other  patient management decisions.  A negative result may occur with  improper specimen collection / handling, submission of specimen other  than nasopharyngeal swab, presence of viral mutation(s) within the  areas targeted by this assay, and inadequate number of viral copies  (<250 copies / mL). A negative result must be combined with clinical  observations, patient history, and epidemiological information. If result is POSITIVE SARS-CoV-2 target nucleic acids are DETECTED. The SARS-CoV-2 RNA is generally detectable in upper and lower  respiratory specimens dur ing the acute phase of infection.  Positive  results are indicative of active infection with SARS-CoV-2.  Clinical  correlation with patient history and other diagnostic information is  necessary to determine patient infection status.  Positive results do  not rule out bacterial infection or co-infection with other viruses. If result is PRESUMPTIVE POSTIVE SARS-CoV-2 nucleic acids MAY BE PRESENT.   A presumptive positive result was obtained on the submitted specimen  and confirmed on repeat testing.  While 2019 novel coronavirus  (SARS-CoV-2) nucleic acids may  be present in the submitted sample  additional confirmatory testing may be necessary for epidemiological  and / or clinical management purposes  to differentiate between  SARS-CoV-2 and other Sarbecovirus currently known to infect humans.  If clinically indicated additional testing with an alternate test  methodology [phone]) is advised. The SARS-CoV-2 RNA is generally  detectable in upper and lower respiratory sp ecimens during the acute  phase of infection. The expected result is Negative. Fact Sheet for Patients:  StrictlyIdeas.no Fact Sheet for Healthcare  Providers: BankingDealers.co.za This test is not yet approved or cleared by the [place] FDA and has been authorized for detection and/or diagnosis of SARS-CoV-2 by FDA under an Emergency Use Authorization (EUA).  This EUA will remain in effect (meaning this test can be used) for the duration of the COVID-19 declaration under Section 564(b)(1) of the Act, 21 U.S.C. section 360bbb-3(b)(1), unless the authorization is terminated or revoked sooner. Performed at [facility], [place] [address], [place], [place] [address]   SARS Coronavirus 2 (CEPHEID - Performed in [place] hospital lab), Hosp Order     Status: None   Collection Time: 05/08/19  8:24 AM  Result Value Ref Range Status   SARS Coronavirus 2 NEGATIVE NEGATIVE Final    Comment: (NOTE) If result is NEGATIVE SARS-CoV-2 target nucleic acids are NOT DETECTED. The SARS-CoV-2 RNA is generally detectable in upper and lower  respiratory specimens during the acute phase of infection. The lowest  concentration of SARS-CoV-2 viral copies this assay can detect is 250  copies / mL. A negative result does not preclude SARS-CoV-2 infection  and should not be used as the sole basis for treatment or other  patient management decisions.  A negative result may occur with  improper specimen collection / handling, submission of specimen other  than nasopharyngeal swab, presence of viral mutation(s) within the  areas targeted by this assay, and inadequate number of viral copies  (<250 copies / mL). A negative result must be combined with clinical  observations, patient history, and epidemiological information. If result is POSITIVE SARS-CoV-2 target nucleic acids are DETECTED. The SARS-CoV-2 RNA is generally detectable in upper and lower  respiratory specimens dur ing the acute phase of infection.  Positive  results are indicative of active infection with SARS-CoV-2.  Clinical  correlation with  patient history and other diagnostic information is  necessary to determine patient infection status.  Positive results do  not rule out bacterial infection or co-infection with other viruses. If result is PRESUMPTIVE POSTIVE SARS-CoV-2 nucleic acids MAY BE PRESENT.   A presumptive positive result was obtained on the submitted specimen  and confirmed on repeat testing.  While 2019 novel coronavirus  (SARS-CoV-2) nucleic acids may be present in the submitted sample  additional confirmatory testing may be necessary for epidemiological  and / or clinical management purposes  to differentiate between  SARS-CoV-2 and other Sarbecovirus currently known to infect humans.  If clinically indicated additional testing with an alternate test  methodology [phone]) is advised. The SARS-CoV-2 RNA is generally  detectable in upper and lower respiratory sp ecimens during the acute  phase of infection. The expected result is Negative. Fact Sheet for Patients:  StrictlyIdeas.no Fact Sheet for Healthcare Providers: BankingDealers.co.za This test is not yet approved or cleared by the [place] FDA and has been authorized for detection and/or diagnosis of SARS-CoV-2 by FDA under an Emergency Use Authorization (EUA).  This EUA will remain in effect (meaning this test can be  used) for the duration of the COVID-19 declaration under Section 564(b)(1) of the Act, 21 U.S.C. section 360bbb-3(b)(1), unless the authorization is terminated or revoked sooner. Performed at [facility], [place] [address] [place], [place] [address]   SARS Coronavirus 2 (CEPHEID - Performed in [place] hospital lab), Hosp Order     Status: None   Collection Time: 05/11/19  1:37 PM  Result Value Ref Range Status   SARS Coronavirus 2 NEGATIVE NEGATIVE Final    Comment: (NOTE) If result is NEGATIVE SARS-CoV-2 target nucleic acids are NOT DETECTED. The SARS-CoV-2 RNA is  generally detectable in upper and lower  respiratory specimens during the acute phase of infection. The lowest  concentration of SARS-CoV-2 viral copies this assay can detect is 250  copies / mL. A negative result does not preclude SARS-CoV-2 infection  and should not be used as the sole basis for treatment or other  patient management decisions.  A negative result may occur with  improper specimen collection / handling, submission of specimen other  than nasopharyngeal swab, presence of viral mutation(s) within the  areas targeted by this assay, and inadequate number of viral copies  (<250 copies / mL). A negative result must be combined with clinical  observations, patient history, and epidemiological information. If result is POSITIVE SARS-CoV-2 target nucleic acids are DETECTED. The SARS-CoV-2 RNA is generally detectable in upper and lower  respiratory specimens dur ing the acute phase of infection.  Positive  results are indicative of active infection with SARS-CoV-2.  Clinical  correlation with patient history and other diagnostic information is  necessary to determine patient infection status.  Positive results do  not rule out bacterial infection or co-infection with other viruses. If result is PRESUMPTIVE POSTIVE SARS-CoV-2 nucleic acids MAY BE PRESENT.   A presumptive positive result was obtained on the submitted specimen  and confirmed on repeat testing.  While 2019 novel coronavirus  (SARS-CoV-2) nucleic acids may be present in the submitted sample  additional confirmatory testing may be necessary for epidemiological  and / or clinical management purposes  to differentiate between  SARS-CoV-2 and other Sarbecovirus currently known to infect humans.  If clinically indicated additional testing with an alternate test  methodology [phone] is advised. The SARS-CoV-2 RNA is generally  detectable in upper and lower respiratory sp ecimens during the acute  phase of  infection. The expected result is Negative. Fact Sheet for Patients:  StrictlyIdeas.no Fact Sheet for Healthcare Providers: BankingDealers.co.za This test is not yet approved or cleared by the [place] FDA and has been authorized for detection and/or diagnosis of SARS-CoV-2 by FDA under an Emergency Use Authorization (EUA).  This EUA will remain in effect (meaning this test can be used) for the duration of the COVID-19 declaration under Section 564(b)(1) of the Act, 21 U.S.C. section 360bbb-3(b)(1), unless the authorization is terminated or revoked sooner. Performed at [facility], [place] [address] [place], [place] [address]   MRSA PCR Screening     Status: None   Collection Time: 05/11/19 11:50 PM  Result Value Ref Range Status   MRSA by PCR NEGATIVE NEGATIVE Final    Comment:        The GeneXpert MRSA Assay (FDA approved for NASAL specimens only), is one component of a comprehensive MRSA colonization surveillance program. It is not intended to diagnose MRSA infection nor to guide or monitor treatment for MRSA infections. Performed at [facility], [place] [address] [place],  [address]  Time coordinating discharge: 36 minutes  SIGNED:   Hosie Poisson, MD  [facility] 05/13/2019, 3:50 PM Pager   If 7PM-7AM, please contact night-coverage [email or web address] Password [email or web address]

## 2019-05-13 NOTE — Progress Notes (Signed)
Inpatient Rehabilitation-Admissions Coordinator   [facility] confirmed pt still wanting to proceed with CIR. AC initiated insurance authorization process. Will follow up once there is a determination.   Please call if questions.   Jhonnie Garner, OTR/L  Rehab Admissions Coordinator  [phone] 05/13/2019 11:42 AM

## 2019-05-14 ENCOUNTER — Other Ambulatory Visit: Payer: Self-pay

## 2019-05-14 ENCOUNTER — Encounter (HOSPITAL_COMMUNITY): Payer: Self-pay

## 2019-05-14 ENCOUNTER — Inpatient Hospital Stay (HOSPITAL_COMMUNITY)
Admission: RE | Admit: 2019-05-14 | Discharge: 2019-05-23 | DRG: 057 | Disposition: A | Discharge status: Home / Self Care | Payer: Commercial Managed Care - PPO | Source: Intra-hospital | Attending: Physical Medicine & Rehabilitation | Admitting: Physical Medicine & Rehabilitation

## 2019-05-14 DIAGNOSIS — Z96643 Presence of artificial hip joint, bilateral: Secondary | ICD-10-CM | POA: Diagnosis present

## 2019-05-14 DIAGNOSIS — Z7902 Long term (current) use of antithrombotics/antiplatelets: Secondary | ICD-10-CM

## 2019-05-14 DIAGNOSIS — I69351 Hemiplegia and hemiparesis following cerebral infarction affecting right dominant side: Secondary | ICD-10-CM | POA: Diagnosis not present

## 2019-05-14 DIAGNOSIS — I6389 Other cerebral infarction: Secondary | ICD-10-CM | POA: Diagnosis present

## 2019-05-14 DIAGNOSIS — E785 Hyperlipidemia, unspecified: Secondary | ICD-10-CM | POA: Diagnosis present

## 2019-05-14 DIAGNOSIS — I6602 Occlusion and stenosis of left middle cerebral artery: Secondary | ICD-10-CM | POA: Diagnosis not present

## 2019-05-14 DIAGNOSIS — Z85828 Personal history of other malignant neoplasm of skin: Secondary | ICD-10-CM | POA: Diagnosis not present

## 2019-05-14 DIAGNOSIS — Z7982 Long term (current) use of aspirin: Secondary | ICD-10-CM

## 2019-05-14 DIAGNOSIS — Z23 Encounter for immunization: Secondary | ICD-10-CM

## 2019-05-14 DIAGNOSIS — G8191 Hemiplegia, unspecified affecting right dominant side: Secondary | ICD-10-CM

## 2019-05-14 DIAGNOSIS — Z79899 Other long term (current) drug therapy: Secondary | ICD-10-CM

## 2019-05-14 DIAGNOSIS — R4701 Aphasia: Secondary | ICD-10-CM

## 2019-05-14 DIAGNOSIS — I69398 Other sequelae of cerebral infarction: Principal | ICD-10-CM

## 2019-05-14 DIAGNOSIS — Z87891 Personal history of nicotine dependence: Secondary | ICD-10-CM

## 2019-05-14 DIAGNOSIS — I10 Essential (primary) hypertension: Secondary | ICD-10-CM | POA: Diagnosis present

## 2019-05-14 DIAGNOSIS — I63512 Cerebral infarction due to unspecified occlusion or stenosis of left middle cerebral artery: Secondary | ICD-10-CM

## 2019-05-14 DIAGNOSIS — I69320 Aphasia following cerebral infarction: Secondary | ICD-10-CM | POA: Diagnosis not present

## 2019-05-14 HISTORY — DX: Unspecified malignant neoplasm of skin, unspecified: C44.90

## 2019-05-14 LAB — CBC WITH DIFFERENTIAL/PLATELET
Abs Immature Granulocytes: 0.04 10*3/uL (ref 0.00–0.07)
Basophils Absolute: 0.1 10*3/uL (ref 0.0–0.1)
Basophils Relative: 1 %
Eosinophils Absolute: 0.3 10*3/uL (ref 0.0–0.5)
Eosinophils Relative: 3 %
HCT: 40.2 % (ref 39.0–52.0)
Hemoglobin: 13.8 g/dL (ref 13.0–17.0)
Immature Granulocytes: 1 %
Lymphocytes Relative: 10 %
Lymphs Abs: 0.8 10*3/uL (ref 0.7–4.0)
MCH: 31.3 pg (ref 26.0–34.0)
MCHC: 34.3 g/dL (ref 30.0–36.0)
MCV: 91.2 fL (ref 80.0–100.0)
Monocytes Absolute: 0.6 10*3/uL (ref 0.1–1.0)
Monocytes Relative: 7 %
Neutro Abs: 6.7 10*3/uL (ref 1.7–7.7)
Neutrophils Relative %: 78 %
Platelets: 218 10*3/uL (ref 150–400)
RBC: 4.41 MIL/uL (ref 4.22–5.81)
RDW: 12.5 % (ref 11.5–15.5)
WBC: 8.4 10*3/uL (ref 4.0–10.5)
nRBC: 0 % (ref 0.0–0.2)

## 2019-05-14 LAB — COMPREHENSIVE METABOLIC PANEL
ALT: 44 U/L (ref 0–44)
AST: 37 U/L (ref 15–41)
Albumin: 3.4 g/dL — ABNORMAL LOW (ref 3.5–5.0)
Alkaline Phosphatase: 79 U/L (ref 38–126)
Anion gap: 8 (ref 5–15)
BUN: 16 mg/dL (ref 8–23)
CO2: 24 mmol/L (ref 22–32)
Calcium: 8.9 mg/dL (ref 8.9–10.3)
Chloride: 107 mmol/L (ref 98–111)
Creatinine, Ser: 1.4 mg/dL — ABNORMAL HIGH (ref 0.61–1.24)
GFR calc Af Amer: >60 mL/min (ref >60)
GFR calc non Af Amer: 52 mL/min — ABNORMAL LOW (ref >60)
Glucose, Bld: 97 mg/dL (ref 70–99)
Potassium: 3.5 mmol/L (ref 3.5–5.1)
Sodium: 139 mmol/L (ref 135–145)
Total Bilirubin: 0.8 mg/dL (ref 0.3–1.2)
Total Protein: 6 g/dL — ABNORMAL LOW (ref 6.5–8.1)

## 2019-05-14 MED ORDER — ALUM & MAG HYDROXIDE-SIMETH 200-200-20 MG/5ML PO SUSP: 30.0000 mL | ORAL | Status: DC | PRN

## 2019-05-14 MED ORDER — ONDANSETRON HCL 4 MG PO TABS: 4.0000 mg | ORAL_TABLET | Freq: Three times a day (TID) | ORAL | Status: DC | PRN

## 2019-05-14 MED ORDER — SORBITOL 70 % SOLN: 30.0000 mL | Freq: Every day | Status: DC | PRN

## 2019-05-14 MED ORDER — PNEUMOCOCCAL VAC POLYVALENT 25 MCG/0.5ML IJ INJ
0.5000 mL | INJECTION | INTRAMUSCULAR | Status: AC
  Administered 2019-05-15: 0.5 mL via INTRAMUSCULAR
  Filled 2019-05-14: qty 0.5

## 2019-05-14 MED ORDER — ACETAMINOPHEN 325 MG PO TABS: 650.0000 mg | ORAL_TABLET | Freq: Four times a day (QID) | ORAL | Status: DC | PRN

## 2019-05-14 MED ORDER — SELENIUM SULFIDE 1 % EX LOTN
TOPICAL_LOTION | Freq: Every day | CUTANEOUS | Status: DC
  Administered 2019-05-15 – 2019-05-23 (×7): via TOPICAL
  Filled 2019-05-14 (×2): qty 207

## 2019-05-14 MED ORDER — TICAGRELOR 90 MG PO TABS
90.0000 mg | ORAL_TABLET | Freq: Two times a day (BID) | ORAL | Status: DC
  Administered 2019-05-14 – 2019-05-23 (×18): 90 mg via ORAL
  Filled 2019-05-14 (×18): qty 1

## 2019-05-14 MED ORDER — TICAGRELOR 90 MG PO TABS
90.0000 mg | ORAL_TABLET | Freq: Two times a day (BID) | ORAL | Status: DC
  Filled 2019-05-14 (×4): qty 1

## 2019-05-14 MED ORDER — ASPIRIN EC 81 MG PO TBEC
81.0000 mg | DELAYED_RELEASE_TABLET | Freq: Every day | ORAL | Status: DC
  Administered 2019-05-15 – 2019-05-23 (×9): 81 mg via ORAL
  Filled 2019-05-14 (×9): qty 1

## 2019-05-14 MED ORDER — TRAZODONE HCL 50 MG PO TABS: 25.0000 mg | ORAL_TABLET | Freq: Every evening | ORAL | Status: DC | PRN

## 2019-05-14 MED ORDER — ATORVASTATIN CALCIUM 80 MG PO TABS
80.0000 mg | ORAL_TABLET | Freq: Every day | ORAL | Status: DC
  Administered 2019-05-14 – 2019-05-22 (×9): 80 mg via ORAL
  Filled 2019-05-14 (×9): qty 1

## 2019-05-14 NOTE — Progress Notes (Signed)
Inpatient Rehabilitation-Admissions Coordinator    Pt still wants to pursue CIR. AC has received insurance approval and medical approval for admit to CIR today. Pt, RN, and CM/SW updated on plan.   Please call if questions.   Jhonnie Garner, OTR/L  Rehab Admissions Coordinator  [phone] 05/14/2019 1:58 PM

## 2019-05-14 NOTE — Progress Notes (Signed)
[name], MD  Physician  Physical Medicine and Rehabilitation  PMR Pre-admission  Signed  Date of Service:  05/07/2019 7:23 PM       Related encounter: ED to Hosp-Admission (Discharged) from 05/05/2019 in [facility] NEURO/TRAUMA/SURGICAL ICU      Signed         PMR Admission Coordinator Pre-Admission Assessment  Patient: Jose Kaufman is an 68 y.o., male MRN: [id number] DOB: 12-16-1950 Height: 5' 7.99" (172.7 cm) Weight: 76.1 kg  Insurance Information HMO:     PPO: yes     PCP:      IPA:      80/20:      OTHER:  PRIMARY: UMR      Policy#: [id number]      Subscriber: Patient CM Name: [name]      Phone#: [phone]     Fax#: [phone] Pre-Cert#: [id number]      Employer:  Josem Kaufmann provided by [name] at [facility] for admit to CIR. Pt is approved for 7 days with start date 6/4. First review date is 6/11. Clinical updates are due to [place] [facility]): [phone] (f): [phone] Benefits:  Phone #: [phone]     Name: online at [email or web address] Eff. Date: 12/10/18-12/10/2019     Deduct: $1,250 (met $209.29)      Out of Pocket Max: $7,150 (includes deductible, $209.29 met)      Life Max: NA CIR: 80% coverage, 20% co-insurance       SNF: 80% coverage, 20% co-insurance; limited to 60 day/cal yr Outpatient: 70% coverage,  limited by medical necessity     Co-Pay: 30% co-insurance with a max per visit co-insurance of $75/visit (deductible waived); Home Health: 80% coverage, limited to 100 visits per cal year      Co-Pay: 20% co-insurance DME: 80% coverage     Co-Pay: 20% co-insurance Providers:  SECONDARY: Medicare Part A      Policy#: [id number]      Subscriber: Patient CM Name:       Phone#:      Fax#:  Pre-Cert#:       Employer:  Benefits:  Phone #:      Name: verified eligibility online via [employer] on 05/13/19 Eff. Date: part A: 08/10/2018     Deduct:       Out of Pocket Max:       Life Max:  CIR:       SNF:  Outpatient:      Co-Pay:  Home Health:       Co-Pay: DME:      Co-Pay:    Medicaid Application Date:       Case Manager:  Disability Application Date:       Case Worker:   The "Data Collection Information Summary" for patients in Inpatient Rehabilitation Facilities with attached "Privacy Act [place] Records" was provided and verbally reviewed with: Patient  Emergency Contact Information         Contact Information    Name Relation Home Work Mobile   Mariscal,Carl Jeannine Kitten [phone]        Current Medical History  Patient Admitting Diagnosis: Acute cortical infarcts in left frontal/parietal.   History of Present Illness: Jose Kaufman is a 68 year old male with history of hypertension as well as hyperlipidemia, PVCs maintained on aspirin.Pt presented 05/05/2019 with altered mental status and word finding difficulties. Cranial CT scan negative for acute findings noted old bilateral frontal and parietal cortical and subcortical infarctions. MRI shows old cortical and subcortical infarcts in both  frontal and parietal regions. Areas of acute infarct in the left frontal and parietal brain without significant swelling or acute hemorrhage. CT angiogram of head and neck advanced atherosclerotic disease at the carotid bifurcation on the right with near occlusion of the ICA. There was a patent string sign seen to the level of the skull base. 50% stenosis of both vertebral artery origins. Cerebral angiogram showed occluded left ICA proximal. Severe preocclusive stenosis of right ICA proximal. Severe preocclusive stenosis of bilateral vertebral arteries of the origins. Patient underwent right VA stenosis status post stent assisted angioplasty 05/12/2019 per interventional radiology. Neurology follow-up currently maintained on aspirin as well as Brilinta. Tolerating a regular consistency diet. Therapy evaluations completed and patient is to be admitted for a comprehensive rehab program on 05/14/19.  Complete NIHSS TOTAL: 1  Patient's  medical record from [facility] has been reviewed by the rehabilitation admission coordinator and physician.  Past Medical History      Past Medical History:  Diagnosis Date  . Abnormality of gait 10/15/2016  . Arthritis   . Dysrhythmia    hx of  . Essential hypertension 03/14/2017  . PVCs (premature ventricular contractions) 03/14/2017    Family History   family history includes Leukemia in his mother; Pneumonia in his father.  Prior Rehab/Hospitalizations Has the patient had prior rehab or hospitalizations prior to admission? No  Has the patient had major surgery during 100 days prior to admission? Yes             Current Medications  Current Facility-Administered Medications:  .  acetaminophen (TYLENOL) tablet 650 mg, 650 mg, Oral, Q4H PRN **OR** acetaminophen (TYLENOL) solution 650 mg, 650 mg, Per Tube, Q4H PRN **OR** acetaminophen (TYLENOL) suppository 650 mg, 650 mg, Rectal, Q4H PRN, [name], [name], MD .  aspirin chewable tablet 81 mg, 81 mg, Oral, Daily, 81 mg at 05/14/19 0903 **OR** aspirin chewable tablet 81 mg, 81 mg, Per Tube, Daily, [name], [name], MD .  atorvastatin (LIPITOR) tablet 80 mg, 80 mg, Oral, q1800, [name], MD, 80 mg at 05/13/19 1839 .  Chlorhexidine Gluconate Cloth 2 % PADS 6 each, 6 each, Topical, Daily, [name], MD, 6 each at 05/13/19 1312 .  ticagrelor (BRILINTA) tablet 90 mg, 90 mg, Oral, BID, 90 mg at 05/14/19 0903 **OR** ticagrelor (BRILINTA) tablet 90 mg, 90 mg, Per Tube, BID, [name], [name], MD  Patients Current Diet:     Diet Order                  Diet - low sodium heart healthy         Diet Heart Room service appropriate? Yes; Fluid consistency: Thin  Diet effective now               Precautions / Restrictions Precautions Precautions: Fall Restrictions Weight Bearing Restrictions: No   Has the patient had 2 or more falls or a fall with injury in the past year?  No  Prior Activity Level Community (5-7x/wk): active and independent PTA; working full time with [employer] PTA. no AD use PTA.   Prior Functional Level Self Care: Did the patient need help bathing, dressing, using the toilet or eating? Independent  Indoor Mobility: Did the patient need assistance with walking from room to room (with or without device)? Independent  Stairs: Did the patient need assistance with internal or external stairs (with or without device)? Independent  Functional Cognition: Did the patient need help planning regular tasks such as  shopping or remembering to take medications? [place] / Equipment Home Assistive Devices/Equipment: Bedside commode/3-in-1, Cane (specify quad or straight), Contact lenses, Shower chair without back, Walker (specify type), Eyeglasses(singlepoint cane, front wheeled walker) Home Equipment: Walker - 2 wheels, Bedside commode, Shower seat  Prior Device Use: Indicate devices/aids used by the patient prior to current illness, exacerbation or injury? None of the above  Current Functional Level Cognition  Arousal/Alertness: Awake/alert Overall Cognitive Status: Impaired/Different from baseline Current Attention Level: Selective Orientation Level: Oriented X4 Following Commands: Follows multi-step commands with increased time Safety/Judgement: Decreased awareness of deficits General Comments: pt noted to have increased difficulty sequencing through grooming tasks, requires increased time to perform, min cues for safety; pt also with decreased insight into current deficits, verbalizing his only deficits were speech related Attention: Focused, Sustained Focused Attention: Appears intact Sustained Attention: Appears intact    Extremity Assessment (includes Sensation/Coordination)  Upper Extremity Assessment: RUE deficits/detail, LUE deficits/detail RUE Deficits / Details: pt continues to have  impaired coordination to RUE (RUE>LUE) RUE Coordination: decreased fine motor, decreased gross motor LUE Deficits / Details: required assistance to open Pillbox assessment medication bottles LUE Coordination: decreased fine motor, decreased gross motor  Lower Extremity Assessment: Defer to PT evaluation RLE Deficits / Details: Bilaterally, strength 4+/5 and pt does not endorse sensation changes. Decreased coordination noted with functional tasks.  RLE Sensation: WNL RLE Coordination: decreased fine motor, decreased gross motor    ADLs  Overall ADL's : Needs assistance/impaired Eating/Feeding: Set up, Sitting Grooming: Wash/dry face, Oral care, Brushing hair, Min guard, Standing Grooming Details (indicate cue type and reason): Pt requires increased time to sequence through tasks, but is able to do so without cues given added time; minguard for standing balance Upper Body Bathing: Min guard, Standing Upper Body Bathing Details (indicate cue type and reason): min guard for balance, min-mod cueing to sequence bathing,drying and applying lotion; noted decreased coordiantion with RU E  Lower Body Bathing: Minimal assistance, Sit to/from stand Upper Body Dressing : Minimal assistance, Sitting Lower Body Dressing: Min guard Lower Body Dressing Details (indicate cue type and reason): pt donned socks while sitting EOB;minguard for stability  Toilet Transfer: Min guard, Ambulation, RW Toilet Transfer Details (indicate cue type and reason): simulated in room Toileting- Clothing Manipulation and Hygiene: Supervision/safety(standing) Functional mobility during ADLs: Min guard, Rolling walker General ADL Comments: pt requires cues to take larger steps with walker; intermittent cues for following through with mobility tasks, initially upon entering bathroom pt started walking towards toilet, but then self corrected to turn towards sink for grooming ADL (pt already reporting he did not need to use bathroom  prior to entry)     Mobility  Overal bed mobility: Needs Assistance Bed Mobility: Sit to Supine Supine to sit: Supervision Sit to supine: Supervision General bed mobility comments: Pt received OOB in recliner    Transfers  Overall transfer level: Needs assistance Equipment used: Rolling walker (2 wheeled) Transfers: Sit to/from Stand Sit to Stand: Min guard General transfer comment: min guard for initial balance    Ambulation / Gait / Stairs / Wheelchair Mobility  Ambulation/Gait Ambulation/Gait assistance: Herbalist (Feet): 200 Feet Assistive device: None Gait Pattern/deviations: Step-through pattern, Decreased stride length, Narrow base of support, Shuffle General Gait Details: Pt with shuffling gait pattern, decreased reciprocal arm swing. Lateral LOB with challenge requiring min assist to correct.  Gait velocity: 1.5 ft/s Gait velocity interpretation: <1.8 ft/sec, indicate of risk for recurrent falls  Posture / Balance Dynamic Sitting Balance Sitting balance - Comments: no LOB with donning socks Balance Overall balance assessment: Needs assistance Sitting-balance support: Feet supported, No upper extremity supported Sitting balance-Leahy Scale: Good Sitting balance - Comments: no LOB with donning socks Standing balance support: No upper extremity supported, During functional activity Standing balance-Leahy Scale: Fair Standing balance comment: min guard for safety and balance Standardized Balance Assessment Standardized Balance Assessment : Dynamic Gait Index Dynamic Gait Index Level Surface: Moderate Impairment Change in Gait Speed: Moderate Impairment Gait with Horizontal Head Turns: Moderate Impairment Gait with Vertical Head Turns: Moderate Impairment Gait and Pivot Turn: Moderate Impairment Step Over Obstacle: Severe Impairment Step Around Obstacles: Severe Impairment Steps: Moderate Impairment Total Score: 6    Special needs/care  consideration BiPAP/CPAP: no CPM : no Continuous Drip IV : no Dialysis : no        Days : no Life Vest : no Oxygen : no Special Bed : no Trach Size : no Wound Vac (area) : no      Location : no Skin: incision to right groin                       Bowel mgmt: continent, last BM 05/13/19 Bladder mgmt: external catheter Diabetic mgmt: no Behavioral consideration : no Chemo/radiation : no   Previous Home Environment (from acute therapy documentation) Living Arrangements: Alone  Lives With: Alone Available Help at Discharge: Family, Available PRN/intermittently Type of Home: House Home Layout: One level Home Access: Stairs to enter Entrance Stairs-Rails: Right, Left, Can reach both Entrance Stairs-Number of Steps: 3 Bathroom Shower/Tub: Multimedia programmer: Standard Home Care Services: No  Discharge Living Setting Plans for Discharge Living Setting: House Type of Home at Discharge: House Discharge Home Layout: One level Discharge Home Access: Stairs to enter Entrance Stairs-Rails: Can reach both Entrance Stairs-Number of Steps: 2 Discharge Bathroom Shower/Tub: Walk-in shower Discharge Bathroom Toilet: Standard Discharge Bathroom Accessibility: Yes How Accessible: Accessible via walker Does the patient have any problems obtaining your medications?: No  Social/Family/Support Systems Patient Roles: Other (Comment)(works full time; has brother that lives nearby) Contact Information: brother Jose Kaufman): [phone] Anticipated Caregiver: brother Anticipated Caregiver's Contact Information: see above Ability/Limitations of Caregiver: Min A/Supervision Caregiver Availability: Intermittent (check in physically at least 1x/day) Discharge Plan Discussed with Primary Caregiver: Yes, brother Is Caregiver In Agreement with Plan?: Yes Does Caregiver/Family have Issues with Lodging/Transportation while Pt is in Rehab?: No  Goals/Additional Needs Patient/Family  Goal for Rehab: PT/OT/SLP: Mod I/ Intermittant Supervision  Expected length of stay: 5-7 days Cultural Considerations: NA Dietary Needs: heart healthy, thin liquids Equipment Needs: TBD Pt/Family Agrees to Admission and willing to participate: Yes Program Orientation Provided & Reviewed with Pt/Caregiver Including Roles  & Responsibilities: Yes(pt and brother).   Barriers to Discharge: Home environment access/layout, Lack of/limited family support  Barriers to Discharge Comments: lives alone, brother can only be there intermittantly; steps to enter  Decrease burden of Care through IP rehab admission: NA  Possible need for SNF placement upon discharge: Not anticipated; pt has good prognosis for further progress through CIR. Anticipate pt will progress to Mod I in short period of time. Brother has confirmed he will be able to check in on him once discharged home form CIR.   Patient Condition: I have reviewed medical records from [facility], spoken with RN and MD, and patient and family member (brother). I met with patient at the bedside for inpatient  rehabilitation assessment.  Patient will benefit from ongoing PT, OT and SLP, can actively participate in 3 hours of therapy a day 5 days of the week, and can make measurable gains during the admission.  Patient will also benefit from the coordinated team approach during an Inpatient Acute Rehabilitation admission.  The patient will receive intensive therapy as well as Rehabilitation physician, nursing, social worker, and care management interventions.  Due to bladder management, bowel management, safety, skin/wound care, disease management, medication administration, pain management and patient education the patient requires 24 hour a day rehabilitation nursing.  The patient is currently Min A with mobility and Min G basic ADLs.  Discharge setting and therapy post discharge at home with home health is anticipated.  Patient has agreed to  participate in the Acute Inpatient Rehabilitation Program and will admit today.  Preadmission Screen Completed By:  Jhonnie Garner, 05/14/2019 2:08 PM ______________________________________________________________________   Discussed status with Dr. Letta Pate on 05/14/19 at 2:06pm and received approval for admission today.  Admission Coordinator:  Jhonnie Garner, OT, time 2:06PM/Date 05/14/19   Assessment/Plan: Diagnosis: Left frontal infarct with right hemiparesis and aphasia causing reduced mobility self-care and communication skills 1. Does the need for close, 24 hr/day Medical supervision in concert with the patient's rehab needs make it unreasonable for this patient to be served in a less intensive setting? Yes 2. Co-Morbidities requiring supervision/potential complications: Essential hypertension, cerebrovascular disease status post vertebral artery stenting postoperative day #2 3. Due to bladder management, bowel management, safety, skin/wound care, disease management, medication administration, pain management and patient education, does the patient require 24 hr/day rehab nursing? Yes 4. Does the patient require coordinated care of a physician, rehab nurse, PT (1-2 hrs/day, 5 days/week), OT (1-2 hrs/day, 5 days/week) and SLP (.5-1 hrs/day, 5 days/week) to address physical and functional deficits in the context of the above medical diagnosis(es)? Yes Addressing deficits in the following areas: balance, endurance, locomotion, strength, transferring, bowel/bladder control, bathing, dressing, feeding, grooming, toileting and psychosocial support 5. Can the patient actively participate in an intensive therapy program of at least 3 hrs of therapy 5 days a week? Yes 6. The potential for patient to make measurable gains while on inpatient rehab is excellent 7. Anticipated functional outcomes upon discharge from inpatients are: modified independent PT, modified independent OT, modified independent SLP 8.  Estimated rehab length of stay to reach the above functional goals is: 7d 9. Anticipated D/C setting: Home 10. Anticipated post D/C treatments: [place] therapy 11. Overall Rehab/Functional Prognosis: excellent  MD Signature: "I have personally performed a face to face diagnostic evaluation of this patient.  Additionally, I have reviewed and concur with the physician assistant's documentation above." [name] M.D. [facility] FAAPM&R (Sports Med, Neuromuscular Med) Diplomate Am Board of Electrodiagnostic Med         Revision History    Date/Time User Provider Type Action  05/14/2019 2:23 PM Kirsteins, Luanna Salk, MD Physician Sign  05/14/2019 2:08 PM Jhonnie Garner, OT Rehab Admission Coordinator Share  View Details Report

## 2019-05-14 NOTE — H&P (Signed)
Physical Medicine and Rehabilitation Admission H&P        Chief Complaint  Patient presents with  . Functional deficits due to stroke      HPI: Jose Kaufman is a 68 year old male with history of HTN, PVCs, gait disorder who was admitted on 05/05/19 with one week history of disorientation with confusion, problems walking and difficulty speaking. MRI brain brain showed old cortical and subcortical infarcts in parietal and frontal regions with areas of acute infarct in left frontal and parietal lobe.  2 D echo showed EF 60-65% with impaired relaxation and no wall abnormality.   CTA head/neck showed advanced arthrosclerotic disease at carotid bifurcation on right with near occlusion of ICA and patent string sign, 50% stenosis B-VA and 50% stenosis proximal L-SA.    Labs revealed AKI with BUN 34/SCro 1.56 and Dr. Leonie Man felt that stroke watershed in nature in setting of dehydration while working outdoors in the heat.  He was started on DAPT and IR consulted for input on stenting. He underwent cerebral angiogram revealing occluded L-ICA with partial reconstitution and severe high grade stenosis of R-ICA proximally with delayed angiographic string sign and severe high grade pre-occlusive stenosis bilateral VA. He was hydrated with IVF with recommendations to keep SBP>120. He underwent right VA stent assisted angioplasty on 06/02 by Dr. Estanislado Pandy and to continue Brilinta bid with ASA 81 mg daily.  He continues to be limited by balance deficits, high level cognitive and expressive deficits. CIR recommended due to functional decline.        ROS   Review of Systems  Constitutional: Negative for chills and fever.  HENT: Negative for congestion, hearing loss and nosebleeds.   Eyes: Negative for blurred vision, double vision and redness.  Respiratory: Negative for cough, hemoptysis and shortness of breath.   Cardiovascular: Negative for chest pain and palpitations.  Gastrointestinal: Negative for abdominal  pain, heartburn, nausea and vomiting.  Genitourinary: Positive for frequency. Negative for dysuria.  Musculoskeletal: Negative for back pain, joint pain and neck pain.  Skin: Negative for itching and rash.  Neurological: Positive for speech change and focal weakness. Negative for dizziness.  Endo/Heme/Allergies: Does not bruise/bleed easily.  Psychiatric/Behavioral: Negative for depression and memory loss.         Past Medical History:  Diagnosis Date  . Abnormality of gait 10/15/2016  . Arthritis    . Dysrhythmia      hx of  . Essential hypertension 03/14/2017  . PVCs (premature ventricular contractions) 03/14/2017           Past Surgical History:  Procedure Laterality Date  . IR ANGIO INTRA EXTRACRAN SEL COM CAROTID INNOMINATE BILAT MOD SED   05/07/2019  . IR ANGIO VERTEBRAL SEL SUBCLAVIAN INNOMINATE BILAT MOD SED   05/07/2019  . RADIOLOGY WITH ANESTHESIA N/A 05/08/2019    Procedure: RADIOLOGY WITH ANESTHESIA;  Surgeon: [name], MD;  Location: [facility];  Service: Radiology;  Laterality: N/A;  . RADIOLOGY WITH ANESTHESIA N/A 05/12/2019    Procedure: carotid stenting;  Surgeon: [name], MD;  Location: [facility];  Service: Radiology;  Laterality: N/A;  . TOTAL HIP ARTHROPLASTY Right 03/22/2017    Procedure: RIGHT TOTAL HIP ARTHROPLASTY ANTERIOR APPROACH;  Surgeon: Mcarthur Rossetti, MD;  Location: [facility];  Service: Orthopedics;  Laterality: Right;  . TOTAL HIP ARTHROPLASTY        Left 08/23/17 Dr. Zollie Beckers  . TOTAL HIP ARTHROPLASTY Left 08/23/2017    Procedure: LEFT TOTAL HIP ARTHROPLASTY ANTERIOR APPROACH;  Surgeon: Mcarthur Rossetti, MD;  Location: [facility];  Service: Orthopedics;  Laterality: Left;           Family History  Problem Relation Age of Onset  . Leukemia Mother    . Pneumonia Father        Social History:  reports that he quit smoking about 27 years ago. His smoking use included cigarettes. He has never used smokeless tobacco. He reports  current alcohol use. He reports that he does not use drugs.           Allergies  Allergen Reactions  . Penicillins Other (See Comments)      Childhood reaction. Has patient had a PCN reaction causing immediate rash, facial/tongue/throat swelling, SOB or lightheadedness with hypotension: unsure Has patient had a PCN reaction causing severe rash involving mucus membranes or skin necrosis: unsure Has patient had a PCN reaction that required hospitalization:No Has patient had a PCN reaction occurring within the last 10 years:No If all of the above answers are "NO", then may proceed with Cephalosporin use            Medications Prior to Admission  Medication Sig Dispense Refill  . acetaminophen (TYLENOL) 325 MG tablet Take 650 mg by mouth every 6 (six) hours as needed for mild pain.       Marland Kitchen aspirin EC 81 MG tablet Take 81 mg by mouth daily.      . ferrous sulfate 325 (65 FE) MG tablet Take 325 mg by mouth 2 (two) times a week.      Marland Kitchen lisinopril-hydrochlorothiazide (PRINZIDE,ZESTORETIC) 10-12.5 MG tablet Take 1 tablet by mouth daily.      . Omega-3 Fatty Acids (FISH OIL PO) Take 1 capsule by mouth every evening.       . thiamine (VITAMIN B-1) 100 MG tablet Take 100 mg by mouth 2 (two) times a week.          Drug Regimen Review Drug regimen was reviewed and the following issues were identified and addressed on lisinopril and hydrochlorothiazide may need to restart if blood pressure elevates   Home: Home Living Family/patient expects to be discharged to:: Private residence Living Arrangements: Alone Available Help at Discharge: Family, Available PRN/intermittently Type of Home: House Home Access: Stairs to enter [employer] of Steps: 3 Entrance Stairs-Rails: Right, Left, Can reach both Home Layout: One level Bathroom Shower/Tub: Multimedia programmer: Standard Home Equipment: Environmental consultant - 2 wheels, Bedside commode, Shower seat  Lives With: Alone   Functional  History: Prior Function Level of Independence: Independent Comments: Works in the Engineer, 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[employer]:  Mobility: Bed Mobility Overal bed mobility: Needs Assistance Bed Mobility: Sit to Supine Supine to sit: Supervision Sit to supine: Supervision General bed mobility comments: Pt received OOB in recliner Transfers Overall transfer level: Needs assistance Equipment used: Rolling walker (2 wheeled) Transfers: Sit to/from Stand Sit to Stand: Min guard General transfer comment: min guard for initial balance Ambulation/Gait Ambulation/Gait assistance: Min assist Gait Distance (Feet): 200 Feet Assistive device: None Gait Pattern/deviations: Step-through pattern, Decreased stride length, Narrow base of support, Shuffle General Gait Details: Pt with shuffling gait pattern, decreased reciprocal arm swing. Lateral LOB with challenge requiring min assist to correct.  Gait velocity: 1.5 ft/s Gait velocity interpretation: <1.8 ft/sec, indicate of risk for recurrent falls   ADL: ADL Overall ADL's : Needs assistance/impaired Eating/Feeding: Set up, Sitting Grooming: Wash/dry face, Oral care, Brushing hair, Min guard, Standing Grooming Details (  indicate cue type and reason): Pt requires increased time to sequence through tasks, but is able to do so without cues given added time; minguard for standing balance Upper Body Bathing: Min guard, Standing Upper Body Bathing Details (indicate cue type and reason): min guard for balance, min-mod cueing to sequence bathing,drying and applying lotion; noted decreased coordiantion with RU E  Lower Body Bathing: Minimal assistance, Sit to/from stand Upper Body Dressing : Minimal assistance, Sitting Lower Body Dressing: Min guard Lower Body Dressing Details (indicate cue type and reason): pt donned socks while sitting EOB;minguard for stability  Toilet Transfer: Min guard, Ambulation, RW Toilet Transfer Details (indicate  cue type and reason): simulated in room Toileting- Clothing Manipulation and Hygiene: Supervision/safety(standing) Functional mobility during ADLs: Min guard, Rolling walker General ADL Comments: pt requires cues to take larger steps with walker; intermittent cues for following through with mobility tasks, initially upon entering bathroom pt started walking towards toilet, but then self corrected to turn towards sink for grooming ADL (pt already reporting he did not need to use bathroom prior to entry)    Cognition: Cognition Overall Cognitive Status: Impaired/Different from baseline Arousal/Alertness: Awake/alert Orientation Level: Oriented X4 Attention: Focused, Sustained Focused Attention: Appears intact Sustained Attention: Appears intact Cognition Arousal/Alertness: Awake/alert Behavior During Therapy: WFL for tasks assessed/performed Overall Cognitive Status: Impaired/Different from baseline Area of Impairment: Attention, Memory, Problem solving, Awareness, Following commands Current Attention Level: Selective Memory: Decreased short-term memory Following Commands: Follows multi-step commands with increased time Safety/Judgement: Decreased awareness of deficits Awareness: Emergent Problem Solving: Slow processing, Difficulty sequencing, Requires verbal cues General Comments: pt noted to have increased difficulty sequencing through grooming tasks, requires increased time to perform, min cues for safety; pt also with decreased insight into current deficits, verbalizing his only deficits were speech related     Blood pressure 139/82, pulse 77, temperature 98.3 F (36.8 C), temperature source Oral, resp. rate 19, height 5' 7.99" (1.727 m), weight 76.1 kg, SpO2 99 %. Physical Exam    General: No acute distress Mood and affect are appropriate Heart: Regular rate and rhythm no rubs murmurs or extra sounds Lungs: Clear to auscultation, breathing unlabored, no rales or wheezes  Abdomen: Positive bowel sounds, soft nontender to palpation, nondistended Extremities: No clubbing, cyanosis, or edema Skin: No evidence of breakdown, no evidence of rash Neurologic: Cranial nerves II through XII intact, Sensory exam normal sensation to light touch and proprioception in bilateral upper and lower extremities Cerebellar exam normal finger to nose to finger as well as heel to shin in bilateral upper and lower extremities Mild difficulty with finger thumb opposition on the right hand Motor strength is 4/5 in the right deltoid bicep tricep grip hip flexor knee extensor ankle dorsiflexor 5/5 in left deltoid bicep tricep grip hip flexion extensor ankle dorsiflexor Musculoskeletal: Full range of motion in all 4 extremities. No joint swelling Lab Results Last 48 Hours        Results for orders placed or performed during the hospital encounter of 05/05/19 (from the past 48 hour(s))  Heparin level (unfractionated)     Status: Abnormal    Collection Time: 05/12/19 10:26 PM  Result Value Ref Range    Heparin Unfractionated 0.11 (L) 0.30 - 0.70 IU/mL      Comment: (NOTE) If heparin results are below expected values, and patient dosage has  been confirmed, suggest follow up testing of antithrombin III levels. Performed at [facility], [place] [address], [place], [place] [address]    CBC  with Differential/Platelet     Status: None    Collection Time: 05/13/19  6:40 AM  Result Value Ref Range    WBC 9.5 4.0 - 10.5 K/uL    RBC 4.32 4.22 - 5.81 MIL/uL    Hemoglobin 13.3 13.0 - 17.0 g/dL    HCT 39.3 39.0 - 52.0 %    MCV 91.0 80.0 - 100.0 fL    MCH 30.8 26.0 - 34.0 pg    MCHC 33.8 30.0 - 36.0 g/dL    RDW 12.5 11.5 - 15.5 %    Platelets 215 150 - 400 K/uL    nRBC 0.0 0.0 - 0.2 %    Neutrophils Relative % 80 %    Neutro Abs 7.7 1.7 - 7.7 K/uL    Lymphocytes Relative 10 %    Lymphs Abs 0.9 0.7 - 4.0 K/uL    Monocytes Relative 6 %    Monocytes Absolute 0.5 0.1 - 1.0 K/uL     Eosinophils Relative 2 %    Eosinophils Absolute 0.2 0.0 - 0.5 K/uL    Basophils Relative 1 %    Basophils Absolute 0.1 0.0 - 0.1 K/uL    Immature Granulocytes 1 %    Abs Immature Granulocytes 0.05 0.00 - 0.07 K/uL      Comment: Performed at [facility], [address], [place], [place] [address]  Basic metabolic panel     Status: Abnormal    Collection Time: 05/13/19  6:40 AM  Result Value Ref Range    Sodium 138 135 - 145 mmol/L    Potassium 3.7 3.5 - 5.1 mmol/L    Chloride 111 98 - 111 mmol/L    CO2 18 (L) 22 - 32 mmol/L    Glucose, Bld 91 70 - 99 mg/dL    BUN 15 8 - 23 mg/dL    Creatinine, Ser 1.12 0.61 - 1.24 mg/dL    Calcium 8.3 (L) 8.9 - 10.3 mg/dL    GFR calc non Af Amer >60 >60 mL/min    GFR calc Af Amer >60 >60 mL/min    Anion gap 9 5 - 15      Comment: Performed at [facility], [place] [address] [place], [name] [address]  Heparin level (unfractionated)     Status: Abnormal    Collection Time: 05/13/19  6:40 AM  Result Value Ref Range    Heparin Unfractionated <0.10 (L) 0.30 - 0.70 IU/mL      Comment: (NOTE) If heparin results are below expected values, and patient dosage has  been confirmed, suggest follow up testing of antithrombin III levels. Performed at [facility], [place] [address] [place], [place] [address]        Imaging Results (Last 48 hours)  No results found.           Medical Problem List and Plan: 1.    Decline in self-care and mobility skills as well as speech secondary to left frontal infarct 2.  Antithrombotics: -DVT/anticoagulation:  Pharmaceutical: Lovenox             -antiplatelet therapy: Aspirin and Brilinta 3. Pain Management: Tylenol as needed 4. Mood: Monitor adjustment may need neuropsych             -antipsychotic agents: None 5. Neuropsych: This patient is capable of making decisions on his own behalf. 6. Skin/Wound Care: No skin lesions will monitor for signs of breakdown 7. Fluids/Electrolytes/Nutrition:  Monitor I's and O's, check basic metabolic package  in a.m. 8. L-VA stent: Continue aspirin and Brilinta 9. HTN: Permissive hypertension, if blood pressure elevates may need to resume lisinopril and/or hydrochlorothiazide  10. Dyslipidema:]continue atorvastatin 80 mg a day         Post Admission Physician Evaluation: 1. Functional deficits secondary  to left frontal infarct, thrombotic versus artery to artery infarct. 2. Patient admitted to receive collaborative, interdisciplinary care between the physiatrist, rehab nursing staff, and therapy team. 3. Patient's level of medical complexity and substantial therapy needs in context of that medical necessity cannot be provided at a lesser intensity of care. 4. Patient has experienced substantial functional loss from his/her baseline. Judging by the patient's diagnosis, physical exam, and functional history, the patient has potential for functional progress which will result in measurable gains while on inpatient rehab.  These gains will be of substantial and practical use upon discharge in facilitating mobility and self-care at the household level. 5. Physiatrist will provide 24 hour management of medical needs as well as oversight of the therapy plan/treatment and provide guidance as appropriate regarding the interaction of the two. 6. 24 hour rehab nursing will assist in the management of  bladder management, bowel management, safety, skin/wound care, disease management, medication administration, pain management and patient education  and help integrate therapy concepts, techniques,education, etc. 7. PT will assess and treat for:  Marland Kitchen  Goals are: independent with assistive device. 8. OT will assess and treat for  .  Goals are: independent with assistive device.  9. SLP will assess and treat for  .  Goals are: independent with increased time. 10. Case Management and Social Worker will assess and treat for psychological issues and discharge planning.  11. Team conference will be held weekly to assess progress toward goals and to determine barriers to discharge. 12.  Patient will receive at least 3 hours of therapy per day at least 5 days per week. 13. ELOS and Prognosis: 7 days excellent  "I have personally performed a face to face diagnostic evaluation of this patient.  Additionally, I have reviewed and concur with the physician assistant's documentation above."   [name] M.D. [facility] FAAPM&R (Sports Med, Neuromuscular Med) Diplomate [place] Board of [place], PA-C 05/14/2019

## 2019-05-14 NOTE — Progress Notes (Signed)
  Speech Language Pathology Treatment: Cognitive-Linquistic(Aphasia )  Patient Details Name: Jose Kaufman MRN: [id number] DOB: 04-01-1951 Today's Date: 05/14/2019 Time: 7989-2119 SLP Time Calculation (min) (ACUTE ONLY): 30 min  Assessment / Plan / Recommendation Clinical Impression  Pt was seen for aphasia treatment and was cooperative throughout the session. He reported that his word retrieval difficulty has improved but is still not back to baseline. He additionally stated that he has been able to spell words without difficulty but still has some difficulty physically writing words due to his fine motor difficulty. He provided 6-10 items per abstract category during divergent naming tasks when min. cues were given. He demonstrated sentence completion with 100% accuracy and achieved 90% accuracy with responsive naming of lower-frequency words when additional processing time was provided. Additional processing time was also required during structured sentence formulation tasks but he achieved 90% accuracy when this time was gievn. At the conversational level he required min. support to retrieve words but hesitations during times of word retrieval difficulty interrupted language fluency. Pt was able to write and accurately spell the names of various pictured items with 100% accuracy. SLP will continue to follow pt.    HPI HPI: Pt is a 68 y.o. male who presented to [name] ED for evaluation of new onset aphasia. He states his symptoms began on 05/02/19 with slurred speech, difficulty with speech comprehension and difficulty with word finding and BLE weakness. Chart review reveals that he also has endorsed confusion and disorientation since last week. MRI of the brain revealed old cortical and subcortical infarctions in both frontal and parietal regions. Areas of acute infarction in the left frontal and parietal brain.       SLP Plan          Recommendations                   Follow up  Recommendations: Inpatient Rehab SLP Visit Diagnosis: Aphasia (R47.01)       Foster Frericks I[address] [facility] Negus, [place], [place] Office number [phone] Pager [place] 05/14/2019, 10:47 AM

## 2019-05-14 NOTE — Progress Notes (Signed)
Pt tx to 4w20. Report called to RN. Assessment stable. All belongings sent with pt.

## 2019-05-14 NOTE — Progress Notes (Signed)
Patient ID: Jose Kaufman, male   DOB: [birth year], 68 y.o.   MRN: [id number] Patient arrived from 4N15 with RN via wheelchair. Patient oriented to room, nurse call bell, rehab process, fall prevention plan, safety plan, schedule, and health resource notebook with verbal understanding. Patient resting comfortably with call bell at side.

## 2019-05-14 NOTE — Evaluation (Signed)
Occupational Therapy Assessment and Plan  Patient Details  Name: Jose Kaufman MRN: [id number] Date of Birth: June 03, 1951  OT Diagnosis: apraxia, hemiplegia affecting dominant side and muscle weakness (generalized) Rehab Potential: Rehab Potential (ACUTE ONLY): Excellent ELOS: 7-10 days    Today's Date: 05/15/2019 OT Individual Time: 3532-9924 OT Individual Time Calculation (min): 58 min     Problem List:  Patient Active Problem List   Diagnosis Date Noted  . Acute bilateral cerebral infarction in a watershed distribution [facility]) 05/14/2019  . Vertebral artery narrowing, right 05/12/2019  . Hyperlipidemia 05/06/2019  . CVA (cerebral vascular accident) ([place]) 05/05/2019  . Internal carotid artery occlusion, bilateral   . Covid-19 Virus not Detected   . Status post total replacement of left hip 08/23/2017  . Unilateral primary osteoarthritis, left hip 05/02/2017  . Status post total replacement of right hip 03/22/2017  . PVCs (premature ventricular contractions) 03/14/2017  . Essential hypertension 03/14/2017  . Unilateral primary osteoarthritis, right hip 03/08/2017  . Bilateral hip pain 10/15/2016  . Abnormality of gait 10/15/2016    Past Medical History:  Past Medical History:  Diagnosis Date  . Abnormality of gait 10/15/2016  . Arthritis   . Dysrhythmia    hx of  . Essential hypertension 03/14/2017  . PVCs (premature ventricular contractions) 03/14/2017  . Skin cancer    Past Surgical History:  Past Surgical History:  Procedure Laterality Date  . IR ANGIO INTRA EXTRACRAN SEL COM CAROTID INNOMINATE BILAT MOD SED  05/07/2019  . IR ANGIO VERTEBRAL SEL SUBCLAVIAN INNOMINATE BILAT MOD SED  05/07/2019  . RADIOLOGY WITH ANESTHESIA N/A 05/08/2019   Procedure: RADIOLOGY WITH ANESTHESIA;  Surgeon: [name], MD;  Location: [place];  Service: Radiology;  Laterality: N/A;  . RADIOLOGY WITH ANESTHESIA N/A 05/12/2019   Procedure: carotid stenting;  Surgeon: [name], MD;   Location: [place];  Service: Radiology;  Laterality: N/A;  . SKIN CANCER EXCISION    . TOTAL HIP ARTHROPLASTY Right 03/22/2017   Procedure: RIGHT TOTAL HIP ARTHROPLASTY ANTERIOR APPROACH;  Surgeon: Mcarthur Rossetti, MD;  Location[facility];  Service: Orthopedics;  Laterality: Right;  . TOTAL HIP ARTHROPLASTY     Left 08/23/17 Dr. Zollie Beckers  . TOTAL HIP ARTHROPLASTY Left 08/23/2017   Procedure: LEFT TOTAL HIP ARTHROPLASTY ANTERIOR APPROACH;  Surgeon: Mcarthur Rossetti, MD;  Location: [facility];  Service: Orthopedics;  Laterality: Left;    Assessment & Plan Clinical Impression: Patient is a 68 y.o. year old male with recent admission to the hospital on  05/05/19 with one week history of disorientation with confusion, problems walking and difficulty speaking. MRI brain brain showed old cortical and subcortical infarcts in parietal and frontal regions with areas of acute infarct in left frontal and parietal lobe.  2 D echo showed EF 60-65% with impaired relaxation and no wall abnormality.   CTA head/neck showed advanced arthrosclerotic disease at carotid bifurcation on right with near occlusion of ICA and patent string sign, 50% stenosis B-VA and 50% stenosis proximal L-SA.    Labs revealed AKI with BUN 34/SCro 1.56 and Dr. Leonie Man felt that stroke watershed in nature in setting of dehydration while working outdoors in the heat.  He was started on DAPT and IR consulted for input on stenting. He underwent cerebral angiogram revealing occluded L-ICA with partial reconstitution and severe high grade stenosis of R-ICA proximally with delayed angiographic string sign and severe high grade pre-occlusive stenosis bilateral VA. He was hydrated with IVF with recommendations to keep SBP>120. He  underwent right VA stent assisted angioplasty on 06/02 by Dr. [name] and to continue Brilinta bid with ASA 81 mg daily.  He continues to be limited by balance deficits, high level cognitive and expressive deficits.  Patient transferred to CIR on 05/14/2019 .    Patient currently requires min with basic self-care skills secondary to muscle weakness, impaired timing and sequencing, unbalanced muscle activation, motor apraxia, ataxia, decreased coordination and decreased motor planning, decreased awareness, decreased safety awareness and delayed processing and decreased standing balance, decreased postural control, hemiplegia and decreased balance strategies.  Prior to hospitalization, patient could complete BADL with independent .  Patient will benefit from skilled intervention to increase independence with basic self-care skills prior to discharge home with care partner.  Anticipate patient will require intermittent supervision and follow up home health.  OT - End of Session Endurance Deficit: Yes Endurance Deficit Description: rest breaks within BADL tasks OT Assessment Rehab Potential (ACUTE ONLY): Excellent OT Patient demonstrates impairments in the following area(s): Balance;Cognition;Endurance;Motor;Safety OT Basic ADL's Functional Problem(s): Grooming;Dressing;Bathing;Toileting OT Transfers Functional Problem(s): Toilet;Tub/Shower OT Additional Impairment(s): Fuctional Use of Upper Extremity OT Plan OT Intensity: Minimum of 1-2 x/day, 45 to 90 minutes OT Frequency: 5 out of 7 days OT Duration/Estimated Length of Stay: 7-10 days OT Treatment/Interventions: Balance/vestibular training;Cognitive remediation/compensation;Community reintegration;Discharge planning;Disease mangement/prevention;DME/adaptive equipment instruction;Functional mobility training;Neuromuscular re-education;Patient/family education;Self Care/advanced ADL retraining;Psychosocial support;Therapeutic Activities;Therapeutic Exercise;UE/LE Strength taining/ROM;UE/LE Coordination activities OT Basic Self-Care Anticipated Outcome(s): mod I/supervision OT Toileting Anticipated Outcome(s): Mod I OT Bathroom Transfers Anticipated Outcome(s):  supervision OT Recommendation Patient destination: Home Follow Up Recommendations: Home health OT Equipment Recommended: To be determined   Skilled Therapeutic Intervention OT eval completed addressing rehab process, OT purpose, POC, ELOS, and goals. Pt upset when OT told him he would not be able to return to work right away or drive right away. OT educated on functional deficits and safety with pt more understanding. Pt ambulatory throughout session with min HHA and very shuffled gait. Pt transferred onto toilet with min HHA, voided bowel and bladder and completed peri-care with set-up and CGA for balance. Pt with difficulty motor planning how to doff pants sitting on commode. Increased time to initiate bathing tasks and needed verbal cues for sequencing and motor planning. Dressing completed from the wc at the sink with assistance to orient shirt, thread R LE into pants, and plan movements within functional task. Standing toothbrushing task with pt needing verbal cues to motor plan screw cap of toothpaste. Pt left seated in recliner at end of session with call bell in reach and alarm belt on.   OT Evaluation Precautions/Restrictions  Precautions Precautions: Fall Restrictions Weight Bearing Restrictions: No Pain Pain Assessment Pain Scale: 0-10 Pain Score: 0-No pain Home Living/Prior Functioning Home Living Family/patient expects to be discharged to:: Private residence Living Arrangements: Alone Available Help at Discharge: Family, Available PRN/intermittently Type of Home: House Home Access: Stairs to enter Entrance Stairs-Number of Steps: 3 Entrance Stairs-Rails: Right, Left, Can reach both Home Layout: One level Bathroom Shower/Tub: Walk-in shower Bathroom Toilet: Standard Additional Comments: Has a BSC and a shower seat, brother will stay with pt following d/c  Lives With: Alone IADL History Homemaking Responsibilities: Yes IADL Comments: 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,  talks on the phone and computer Prior Function Level of Independence: Independent with basic ADLs, Independent with homemaking with ambulation  Able to Take Stairs?: Yes Driving: Yes Vocation: Full time employment Leisure: Hobbies-yes (Comment) Comments: 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; enjoys watching sports (football, basketball) ADL ADL Eating: Set up Grooming: Minimal assistance Upper Body Bathing: Contact guard Lower Body Bathing: Minimal assistance Upper Body Dressing: Minimal assistance Lower Body Dressing: Moderate assistance Toileting: Minimal assistance Toilet Transfer: Minimal assistance Social research officer, government: Minimal assistance Vision Baseline Vision/History: No visual deficits Patient Visual Report: No change from baseline Perception  Perception: Within Functional Limits Praxis Praxis: Impaired Praxis Impairment Details: Motor planning Praxis-Other Comments: poor motor planning with B UE tasks  Cognition Arousal/Alertness: Awake/alert Orientation Level: Person;Place;Situation Person: Oriented Place: Oriented Situation: Oriented Year: 2020 Month: June Day of Week: Correct Memory: Impaired Immediate Memory Recall: Sock;Blue;Bed Memory Recall: Sock;Blue Memory Recall Sock: Without Cue Memory Recall Blue: Without Cue Attention: Focused;Sustained Focused Attention: Appears intact Sustained Attention: Appears intact Awareness: Impaired Problem Solving: Impaired Safety/Judgment: Impaired Sensation Sensation Light Touch: Appears Intact Hot/Cold: Appears Intact Stereognosis: Not tested Coordination Gross Motor Movements are Fluid and Coordinated: No Fine Motor Movements are Fluid and Coordinated: No Coordination and Movement Description: decreased smoothness and accuracy Finger Nose Finger Test: R dysmetria Heel Shin Test: symmetrical with no significant deviations noted Motor   Motor Motor: Motor apraxia Motor - Skilled Clinical Observations: generalized weakness, motor planning and coordination deficits  Balance Balance Balance Assessed: Yes Static Standing Balance Static Standing - Balance Support: During functional activity Static Standing - Level of Assistance: 5: Stand by assistance Dynamic Standing Balance Dynamic Standing - Balance Support: During functional activity Dynamic Standing - Level of Assistance: 4: Min assist Extremity/Trunk Assessment RUE Assessment RUE Assessment: Exceptions to [facility] General Strength Comments: 4+/5 overall- deficits more in coordination and motor planning  RUE Body System: Neuro Brunstrum levels for arm and hand: Arm;Hand Brunstrum level for arm: Stage V Relative Independence from Synergy Brunstrum level for hand: Stage VI Isolated joint movements LUE Assessment LUE Assessment: Within Functional Limits   Refer to Care Plan for Long Term Goals  Recommendations for other services: None   Discharge Criteria: Patient will be discharged from OT if patient refuses treatment 3 consecutive times without medical reason, if treatment goals not met, if there is a change in medical status, if patient makes no progress towards goals or if patient is discharged from hospital.  The above assessment, treatment plan, treatment alternatives and goals were discussed and mutually agreed upon: by patient  Valma Cava 05/15/2019, 11:41 AM

## 2019-05-15 ENCOUNTER — Inpatient Hospital Stay (HOSPITAL_COMMUNITY): Payer: Commercial Managed Care - PPO | Admitting: Physical Therapy

## 2019-05-15 ENCOUNTER — Inpatient Hospital Stay (HOSPITAL_COMMUNITY): Payer: Commercial Managed Care - PPO | Admitting: Occupational Therapy

## 2019-05-15 ENCOUNTER — Encounter (HOSPITAL_COMMUNITY): Payer: Self-pay | Admitting: Interventional Radiology

## 2019-05-15 ENCOUNTER — Inpatient Hospital Stay (HOSPITAL_COMMUNITY): Payer: Commercial Managed Care - PPO | Admitting: Speech Pathology

## 2019-05-15 DIAGNOSIS — I6602 Occlusion and stenosis of left middle cerebral artery: Secondary | ICD-10-CM

## 2019-05-15 DIAGNOSIS — I69320 Aphasia following cerebral infarction: Secondary | ICD-10-CM

## 2019-05-15 DIAGNOSIS — I69351 Hemiplegia and hemiparesis following cerebral infarction affecting right dominant side: Secondary | ICD-10-CM

## 2019-05-15 LAB — CBC WITH DIFFERENTIAL/PLATELET
Abs Immature Granulocytes: 0.04 10*3/uL (ref 0.00–0.07)
Basophils Absolute: 0.1 10*3/uL (ref 0.0–0.1)
Basophils Relative: 1 %
Eosinophils Absolute: 0.3 10*3/uL (ref 0.0–0.5)
Eosinophils Relative: 3 %
HCT: 42.4 % (ref 39.0–52.0)
Hemoglobin: 14.5 g/dL (ref 13.0–17.0)
Immature Granulocytes: 0 %
Lymphocytes Relative: 11 %
Lymphs Abs: 0.9 10*3/uL (ref 0.7–4.0)
MCH: 31.2 pg (ref 26.0–34.0)
MCHC: 34.2 g/dL (ref 30.0–36.0)
MCV: 91.2 fL (ref 80.0–100.0)
Monocytes Absolute: 0.6 10*3/uL (ref 0.1–1.0)
Monocytes Relative: 7 %
Neutro Abs: 7 10*3/uL (ref 1.7–7.7)
Neutrophils Relative %: 78 %
Platelets: 225 10*3/uL (ref 150–400)
RBC: 4.65 MIL/uL (ref 4.22–5.81)
RDW: 12.6 % (ref 11.5–15.5)
WBC: 8.9 10*3/uL (ref 4.0–10.5)
nRBC: 0 % (ref 0.0–0.2)

## 2019-05-15 NOTE — Progress Notes (Signed)
Social Work  Social Work Assessment and Plan  Patient Details  Name: Jose Kaufman MRN: [id number] Date of Birth: 01-May-1951  Today's Date: 05/15/2019  Problem List:  Patient Active Problem List   Diagnosis Date Noted  . Acute bilateral cerebral infarction in a watershed distribution [facility]) 05/14/2019  . Vertebral artery narrowing, right 05/12/2019  . Hyperlipidemia 05/06/2019  . CVA (cerebral vascular accident) ([place]) 05/05/2019  . Internal carotid artery occlusion, bilateral   . Covid-19 Virus not Detected   . Status post total replacement of left hip 08/23/2017  . Unilateral primary osteoarthritis, left hip 05/02/2017  . Status post total replacement of right hip 03/22/2017  . PVCs (premature ventricular contractions) 03/14/2017  . Essential hypertension 03/14/2017  . Unilateral primary osteoarthritis, right hip 03/08/2017  . Bilateral hip pain 10/15/2016  . Abnormality of gait 10/15/2016   Past Medical History:  Past Medical History:  Diagnosis Date  . Abnormality of gait 10/15/2016  . Arthritis   . Dysrhythmia    hx of  . Essential hypertension 03/14/2017  . PVCs (premature ventricular contractions) 03/14/2017  . Skin cancer    Past Surgical History:  Past Surgical History:  Procedure Laterality Date  . IR ANGIO INTRA EXTRACRAN SEL COM CAROTID INNOMINATE BILAT MOD SED  05/07/2019  . IR ANGIO VERTEBRAL SEL SUBCLAVIAN INNOMINATE BILAT MOD SED  05/07/2019  . RADIOLOGY WITH ANESTHESIA N/A 05/08/2019   Procedure: RADIOLOGY WITH ANESTHESIA;  Surgeon: [name], MD;  Location: [place];  Service: Radiology;  Laterality: N/A;  . RADIOLOGY WITH ANESTHESIA N/A 05/12/2019   Procedure: carotid stenting;  Surgeon: [name], MD;  Location: [place];  Service: Radiology;  Laterality: N/A;  . SKIN CANCER EXCISION    . TOTAL HIP ARTHROPLASTY Right 03/22/2017   Procedure: RIGHT TOTAL HIP ARTHROPLASTY ANTERIOR APPROACH;  Surgeon: Mcarthur Rossetti, MD;  [facility];   Service: Orthopedics;  Laterality: Right;  . TOTAL HIP ARTHROPLASTY     Left 08/23/17 Dr. Zollie Beckers  . TOTAL HIP ARTHROPLASTY Left 08/23/2017   Procedure: LEFT TOTAL HIP ARTHROPLASTY ANTERIOR APPROACH;  Surgeon: Mcarthur Rossetti, MD;  Location[facility];  Service: Orthopedics;  Laterality: Left;   Social History:  reports that he quit smoking about 27 years ago. His smoking use included cigarettes. He has never used smokeless tobacco. He reports current alcohol use. He reports that he does not use drugs.  Family / Support Systems Marital Status: Divorced Patient Roles: Parent, Other (Comment)(employee & sibling) Children: two children out of town Other Supports: Carl-brother [phone] Anticipated Caregiver: Brother Ability/Limitations of Caregiver: Supervision-mod/i-does not plan to stay long with him Caregiver Availability: Other (Comment)(can be there the first few days but then intermittent assist) Family Dynamics: Close with his brother and can count on him. He reports he will be with him for a few days then back to his home. Which is not far from pt's home. Pt plans to be mod/i and not need assist at DC  Social History Preferred language: English Religion: Baptist Cultural Background: No issues Education: HS Read: Yes Write: Yes Employment Status: Employed Name of Employer: [employer] Return to Work Plans: Will return if recovered from this stroke Legal History/Current Legal Issues: No issues Guardian/Conservator: None-according to MD pt is capable of making his own decisions while here, but he will include his brother.   Abuse/Neglect Abuse/Neglect Assessment Can Be Completed: Yes Physical Abuse: Denies Verbal Abuse: Denies Sexual Abuse: Denies Exploitation of patient/patient's resources: Denies Self-Neglect: Denies  Emotional  Status Pt's affect, behavior and adjustment status: Pt is motivated to improve and regain his independence from here. He  has always been independent and taken care of himself even after THR's. He is encouraged by the progress he has made already and hopeful it will continue. Recent Psychosocial Issues: other health issues- THR's 2018 Psychiatric History: No history deferred depression screen due to coping appropriately and positive and looking forward to more therapies. Will ask team if feels needs neuro-psych while here Substance Abuse History: No issues  Patient / Family Perceptions, Expectations & Goals Pt/Family understanding of illness & functional limitations: Pt and brother are able to explain his stroke and the deficits he has. Both have spoken with the MD and feel they have a good understanding of his treatment plan going forward. 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, sibling, father, home owner, etc Anticipated changes in roles/activities/participation: resume Pt/family expectations/goals: Pt states: " I hope to recover and be independent when I leave here."  Brother states: " I hope he does well he does not like asking for help and will try to do on his own."  [employer]: Other (Comment)(OPPT after THR) Premorbid Home Care/DME Agencies: Other (Comment)(rw, bsc, tub seat) Transportation available at discharge: Brother pt was driving prior to this Resource referrals recommended: Support group (specify)  Discharge Planning Living Arrangements: Alone Support Systems: Children, Other relatives, Friends/neighbors Type of Residence: Private residence Insurance Resources: Multimedia programmer (specify), Medicare([facility]) Financial Resources: Employment Financial Screen Referred: No Living Expenses: Own Money Management: Patient Does the patient have any problems obtaining your medications?: No Home Management: Self Patient/Family Preliminary Plans: Return home with his brother staying a few days with him then checking on him daily. Will await therapy team evaluations and  work on discharge needs. Possible short length of stay accoridng to admitting MD. Social Work Anticipated Follow Up Needs: HH/OP, Support Group  Clinical Impression Pleasant motivated gentleman who is wanting to regain his independence before leaving rehab. His brother will stay with him a few days for the transition home form the hospital. He hopes to return to work if able. Will await therapy evaluations and work on discharge needs.  [name] 05/15/2019, 11:43 AM

## 2019-05-15 NOTE — Plan of Care (Signed)
  Problem: Consults Goal: RH STROKE PATIENT EDUCATION Description See Patient Education module for education specifics  Outcome: Progressing   Problem: RH SKIN INTEGRITY Goal: RH STG SKIN FREE OF INFECTION/BREAKDOWN Description Skin to remain free from infection and breakdown with min assist.  Outcome: Progressing Goal: RH STG MAINTAIN SKIN INTEGRITY WITH ASSISTANCE Description STG Maintain Skin Integrity With min Assistance.  Outcome: Progressing   Problem: RH SAFETY Goal: RH STG ADHERE TO SAFETY PRECAUTIONS W/ASSISTANCE/DEVICE Description STG Adhere to Safety Precautions With min Assistance and appropriate assistive Device.  Outcome: Progressing   Problem: RH PAIN MANAGEMENT Goal: RH STG PAIN MANAGED AT OR BELOW PT'S PAIN GOAL Description <3 on a 0-10 pain scale.  Outcome: Progressing   Problem: RH KNOWLEDGE DEFICIT Goal: RH STG INCREASE KNOWLEDGE OF HYPERTENSION Description Patient will demonstrate knowledge of HTN medications, BP parameters, and follow up care with the MD post discharge with min assist.  Outcome: Progressing Goal: RH STG INCREASE KNOWLEDGE OF STROKE PROPHYLAXIS Description Patient will demonstrate knowledge of stroke medications used to prevent future strokes and follow up care with the MD post discharge with min assist.  Outcome: Progressing

## 2019-05-15 NOTE — Progress Notes (Signed)
Patient information reviewed and entered into [facility] System by Becky Beacher Every, PPS coordinator. Information including medical coding, function ability, and quality indicators will be reviewed and updated through discharge.   

## 2019-05-15 NOTE — Evaluation (Signed)
Speech Language Pathology Assessment and Plan  Patient Details  Name: Jose Kaufman MRN: [id number] Date of Birth: [birth year]  SLP Diagnosis: Aphasia;Cognitive Impairments  Rehab Potential: Excellent ELOS: 7-10 days    Today's Date: 05/15/2019 SLP Individual Time: 0905-1000 SLP Individual Time Calculation (min): 55 min   Problem List:  Patient Active Problem List   Diagnosis Date Noted  . Acute bilateral cerebral infarction in a watershed distribution [facility]) 05/14/2019  . Vertebral artery narrowing, right 05/12/2019  . Hyperlipidemia 05/06/2019  . CVA (cerebral vascular accident) ([place]) 05/05/2019  . Internal carotid artery occlusion, bilateral   . Covid-19 Virus not Detected   . Status post total replacement of left hip 08/23/2017  . Unilateral primary osteoarthritis, left hip 05/02/2017  . Status post total replacement of right hip 03/22/2017  . PVCs (premature ventricular contractions) 03/14/2017  . Essential hypertension 03/14/2017  . Unilateral primary osteoarthritis, right hip 03/08/2017  . Bilateral hip pain 10/15/2016  . Abnormality of gait 10/15/2016   Past Medical History:  Past Medical History:  Diagnosis Date  . Abnormality of gait 10/15/2016  . Arthritis   . Dysrhythmia    hx of  . Essential hypertension 03/14/2017  . PVCs (premature ventricular contractions) 03/14/2017  . Skin cancer    Past Surgical History:  Past Surgical History:  Procedure Laterality Date  . IR ANGIO INTRA EXTRACRAN SEL COM CAROTID INNOMINATE BILAT MOD SED  05/07/2019  . IR ANGIO VERTEBRAL SEL SUBCLAVIAN INNOMINATE BILAT MOD SED  05/07/2019  . IR TRANSCATH EXCRAN VERT OR CAR A STENT  05/12/2019  . IR US GUIDE VASC ACCESS RIGHT  05/12/2019  . RADIOLOGY WITH ANESTHESIA N/A 05/08/2019   Procedure: RADIOLOGY WITH ANESTHESIA;  Surgeon: [name], MD;  Location: [place];  Service: Radiology;  Laterality: N/A;  . RADIOLOGY WITH ANESTHESIA N/A 05/12/2019   Procedure: carotid stenting;  Surgeon:  [name], MD;  Location: [place];  Service: Radiology;  Laterality: N/A;  . SKIN CANCER EXCISION    . TOTAL HIP ARTHROPLASTY Right 03/22/2017   Procedure: RIGHT TOTAL HIP ARTHROPLASTY ANTERIOR APPROACH;  Surgeon: Mcarthur Rossetti, MD;  [facility];  Service: Orthopedics;  Laterality: Right;  . TOTAL HIP ARTHROPLASTY     Left 08/23/17 Dr. Zollie Beckers  . TOTAL HIP ARTHROPLASTY Left 08/23/2017   Procedure: LEFT TOTAL HIP ARTHROPLASTY ANTERIOR APPROACH;  Surgeon: Mcarthur Rossetti, MD;  [facility];  Service: Orthopedics;  Laterality: Left;    Assessment / Plan / Recommendation Clinical Impression Patient is a 68 year old male with history of hypertension as well as hyperlipidemia, PVCs maintained on aspirin.Pt presented 05/05/2019 with altered mental status and word finding difficulties. Cranial CT scan negative for acute findings noted old bilateral frontal and parietal cortical and subcortical infarctions. MRI shows old cortical and subcortical infarcts in both frontal and parietal regions. Areas of acute infarct in the left frontal and parietal brain without significant swelling or acute hemorrhage. CT angiogram of head and neck advanced atherosclerotic disease at the carotid bifurcation on the right with near occlusion of the ICA. There was a patent string sign seen to the level of the skull base. 50% stenosis of both vertebral artery origins. Cerebral angiogram showed occluded left ICA proximal. Severe preocclusive stenosis of right ICA proximal. Severe preocclusive stenosis of bilateral vertebral arteries of the origins. Patient underwent right VA stenosis status post stent assisted angioplasty 05/12/2019 per interventional radiology. Neurology follow-up currently maintained on aspirin as well as Brilinta. Tolerating a regular consistency diet. Therapy  evaluations completed and patient is to be admitted for a comprehensive rehab program on 05/14/19.  Patient  demonstrates a mild aphasia impacting verbal fluency at the conversation level, complex auditory comprehension and written expression at the word level. Patient also demonstrates mild cognitive impairments impacting anticipatory awareness. Patient would benefit from skilled SLP intervention to maximize his cognitive-linguistic function and overall functional independence prior to discharge.    Skilled Therapeutic Interventions          Administered a cognitive-linguistic evaluation, please see above for details. Educated patient in regards to current cognitive-linguistic impairments and goals of skilled SLP intervention. He verbalized understanding and agreement.    SLP Assessment  Patient will need skilled [place] Pathology Services during CIR admission    Recommendations  Oral Care Recommendations: Oral care BID Recommendations for Other Services: Neuropsych consult Patient destination: Home Follow up Recommendations: 24 hour supervision/assistance;Outpatient SLP Equipment Recommended: None recommended by SLP    SLP Frequency 3 to 5 out of 7 days   SLP Duration  SLP Intensity  SLP Treatment/Interventions 7-10 days  Minumum of 1-2 x/day, 30 to 90 minutes  Cognitive remediation/compensation;Environmental controls;Internal/external aids;Speech/Language facilitation;Therapeutic Activities;Patient/family education;Functional tasks;Cueing hierarchy    Pain No/Denies Pain    Prior Functioning Type of Home: House  Lives With: Alone Available Help at Discharge: Family;Available PRN/intermittently Vocation: Full time employment  Short Term Goals: Week 1: SLP Short Term Goal 1 (Week 1): STGs=LTGs due to short length of stay   Refer to Care Plan for Long Term Goals  Recommendations for other services: Neuropsych  Discharge Criteria: Patient will be discharged from SLP if patient refuses treatment 3 consecutive times without medical reason, if treatment goals not met, if there  is a change in medical status, if patient makes no progress towards goals or if patient is discharged from hospital.  The above assessment, treatment plan, treatment alternatives and goals were discussed and mutually agreed upon: by patient  Peri Kreft 05/15/2019, 12:45 PM

## 2019-05-15 NOTE — Evaluation (Signed)
Physical Therapy Assessment and Plan  Patient Details  Name: Jose Kaufman MRN: [id number] Date of Birth: June 25, 1951  PT Diagnosis: Abnormality of gait, Cognitive deficits, Difficulty walking, Hemiparesis dominant and Impaired cognition Rehab Potential: Good ELOS: 7-10 days   Today's Date: 05/15/2019 PT Individual Time: 0800-0859 PT Individual Time Calculation (min): 59 min    Problem List:  Patient Active Problem List   Diagnosis Date Noted  . Acute bilateral cerebral infarction in a watershed distribution [facility]) 05/14/2019  . Vertebral artery narrowing, right 05/12/2019  . Hyperlipidemia 05/06/2019  . CVA (cerebral vascular accident) ([place]) 05/05/2019  . Internal carotid artery occlusion, bilateral   . Covid-19 Virus not Detected   . Status post total replacement of left hip 08/23/2017  . Unilateral primary osteoarthritis, left hip 05/02/2017  . Status post total replacement of right hip 03/22/2017  . PVCs (premature ventricular contractions) 03/14/2017  . Essential hypertension 03/14/2017  . Unilateral primary osteoarthritis, right hip 03/08/2017  . Bilateral hip pain 10/15/2016  . Abnormality of gait 10/15/2016    Past Medical History:  Past Medical History:  Diagnosis Date  . Abnormality of gait 10/15/2016  . Arthritis   . Dysrhythmia    hx of  . Essential hypertension 03/14/2017  . PVCs (premature ventricular contractions) 03/14/2017  . Skin cancer    Past Surgical History:  Past Surgical History:  Procedure Laterality Date  . IR ANGIO INTRA EXTRACRAN SEL COM CAROTID INNOMINATE BILAT MOD SED  05/07/2019  . IR ANGIO VERTEBRAL SEL SUBCLAVIAN INNOMINATE BILAT MOD SED  05/07/2019  . RADIOLOGY WITH ANESTHESIA N/A 05/08/2019   Procedure: RADIOLOGY WITH ANESTHESIA;  Surgeon: [name], MD;  Location: [place];  Service: Radiology;  Laterality: N/A;  . RADIOLOGY WITH ANESTHESIA N/A 05/12/2019   Procedure: carotid stenting;  Surgeon: [name], MD;  Location: [place];   Service: Radiology;  Laterality: N/A;  . SKIN CANCER EXCISION    . TOTAL HIP ARTHROPLASTY Right 03/22/2017   Procedure: RIGHT TOTAL HIP ARTHROPLASTY ANTERIOR APPROACH;  Surgeon: Mcarthur Rossetti, MD;  [facility];  Service: Orthopedics;  Laterality: Right;  . TOTAL HIP ARTHROPLASTY     Left 08/23/17 Dr. Zollie Beckers  . TOTAL HIP ARTHROPLASTY Left 08/23/2017   Procedure: LEFT TOTAL HIP ARTHROPLASTY ANTERIOR APPROACH;  Surgeon: Mcarthur Rossetti, MD;  [facility];  Service: Orthopedics;  Laterality: Left;    Assessment & Plan Clinical Impression: Patient is a 68 y.o. year old male  with history of HTN, PVCs, gait disorder who was admitted on 05/05/19 with one week history of disorientation with confusion, problems walking and difficulty speaking. MRI brain brain showed old cortical and subcortical infarcts in parietal and frontal regions with areas of acute infarct in left frontal and parietal lobe. 2 D echo showed EF 60-65% with impaired relaxation and no wall abnormality. CTA head/neck showed advanced arthrosclerotic disease at carotid bifurcation on right with near occlusion of ICA and patent string sign, 50% stenosis B-VA and 50% stenosis proximal L-SA.   Labs revealed AKI with BUN 34/SCro 1.56 andDr. Leonie Man felt that stroke watershed in nature in setting of dehydration while working outdoors in the heat.He was started on DAPT and IR consulted for input on stenting. He underwent cerebral angiogram revealing occluded L-ICA with partial reconstitution and severe high grade stenosis of R-ICA proximally with delayed angiographic string sign and severe high grade pre-occlusive stenosis bilateral VA. He was hydrated with IVF with recommendations to keep SBP>120. He underwent right VA stent assisted angioplasty  on 06/02 by Dr. Estanislado Pandy and to continue Brilinta bid with ASA 81 mg daily. He continues to be limited by balance deficits, high level cognitive and expressive deficits.  CIR recommended due to functional decline. Patient transferred to CIR on 05/14/2019 .   Patient currently requires min with mobility secondary to muscle weakness, decreased cardiorespiratoy endurance, impaired timing and sequencing, unbalanced muscle activation and decreased motor planning,  , decreased awareness, decreased problem solving, decreased safety awareness and delayed processing and decreased standing balance, decreased postural control and decreased balance strategies.  Prior to hospitalization, patient was independent  with mobility and lived with Alone in a House home.  Home access is 3Stairs to enter.  Patient will benefit from skilled PT intervention to maximize safe functional mobility, minimize fall risk and decrease caregiver burden for planned discharge home with 24 hour supervision.  Anticipate patient will benefit from follow up OP at discharge.  PT - End of Session Activity Tolerance: Tolerates 30+ min activity with multiple rests Endurance Deficit: Yes Endurance Deficit Description: decreased PT Assessment Rehab Potential (ACUTE/IP ONLY): Good PT Barriers to Discharge: Decreased caregiver support;Lack of/limited family support PT Patient demonstrates impairments in the following area(s): Balance;Safety;Endurance;Motor PT Transfers Functional Problem(s): Bed Mobility;Bed to Chair;Car;Furniture;Floor PT Locomotion Functional Problem(s): Ambulation;Stairs PT Plan PT Intensity: Minimum of 1-2 x/day ,45 to 90 minutes PT Frequency: 5 out of 7 days PT Duration Estimated Length of Stay: 7-10 days PT Treatment/Interventions: Ambulation/gait training;Cognitive remediation/compensation;Discharge planning;DME/adaptive equipment instruction;Functional mobility training;Pain management;Psychosocial support;Splinting/orthotics;Therapeutic Activities;UE/LE Strength taining/ROM;Visual/perceptual remediation/compensation;Balance/vestibular training;Community reintegration;Disease  management/prevention;Functional electrical stimulation;Neuromuscular re-education;Patient/family education;Stair training;Therapeutic Exercise;UE/LE Coordination activities PT Transfers Anticipated Outcome(s): mod-I sit<>stand and bed<>chair transfers PT Locomotion Anticipated Outcome(s): supervision ambulating with LRAD PT Recommendation Recommendations for Other Services: Therapeutic Recreation consult Therapeutic Recreation Interventions: Pet therapy;Kitchen group;Stress management Follow Up Recommendations: Outpatient PT Patient destination: Home Equipment Recommended: To be determined  Skilled Therapeutic Intervention Evaluation completed (see details above and below) with education on PT POC and goals and individual treatment initiated with focus on bed mobility, transfers, gait, stair navigation, as well as education regarding daily therapy schedule, weekly team meetings, purpose of PT evaluation, and other CIR information. Pt received supine in bed and agreeable to therapy session. Supine>sit with supervision and increased time. Donned LB clothing sitting on EOB with min-mod assist for threading R LE and supervision for sitting balance - pt demonstrated impaired motor planning for threading R LE. Sit<>stand EOB/w/c<>no AD with CGA for steadying throughout session. Ambulated ~36f, no AD, to w/c with min assist/CGA for balance/steadying. Transported to/from gym in w/c. Ambulated 2279f no AD, with min assist/CGA for balance/steadying - demonstrates shuffling steps, decreased arm swing, decreased trunk rotation, and decreased foot clearance. Ascended/descended 12 steps using bilateral handrails with min assist for balance and pt ascending with reciprocal steps and descending with step-to pattern leading with L LE. Therapist provided cushion for w/c to improve pressure relief and increase upright activity tolerance. Performed ambulatory car transfer, no AD, with CGA for steadying x2 and pt  demonstrating decreased safety awareness and poor motor planning  by trying to crawl into the vehicle forward with mod cuing for turning and sitting first - demonstrated recall of cuing and performed proper technique with minimal cuing 2nd trial. Ambulated up/down ramp, over [id number] of mulch, and ascended/descended curb step with pt using 1 HR (despite cuing to use none) all with min assist for balance. Demonstrated ability to pick up object from floor with CGA for steadying.Transported back to room in w/c. Ambulated ~  21f w/c>recliner, no AD, with CGA for steadying. Pt left sitting in recliner with needs in reach, seat belt alarm on, and nursing staff present.   PT Evaluation Precautions/Restrictions Precautions Precautions: Fall Restrictions Weight Bearing Restrictions: No Pain Pain Assessment Pain Scale: 0-10 Pain Score: 0-No pain Home Living/Prior Functioning Home Living Available Help at Discharge: Family;Available PRN/intermittently(brother (not working and lives ~5-10 minutes from patient)) Type of Home: House Home Access: Stairs to enter ECenterPoint Energyof Steps: 3 Entrance Stairs-Rails: Right;Left;Can reach both Home Layout: One level  Lives With: Alone Prior Function Level of Independence: Independent with homemaking with ambulation;Independent with gait;Independent with transfers  Able to Take Stairs?: Yes Driving: Yes Vocation: Full time employment Leisure: Hobbies-yes (Comment) Comments: Works in the PEngineer, 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; enjoys watching sports (football, basketball) Vision/Perception  PGeologist, engineering Within FFinancial controllerPraxis: Impaired Praxis Impairment Details: MSurveyor, mineralsComments: demonstrated during LB cPsychiatristArousal/Alertness: Awake/alert Orientation Level: Oriented X4 Attention: Focused;Sustained Focused Attention: Appears  intact Sustained Attention: Appears intact Awareness: Impaired Problem Solving: Impaired Safety/Judgment: Impaired(demonstrated during car transfer) Comments: impaired processing during functional mobility tasks Sensation Sensation Light Touch: Appears Intact(intact per screen) Hot/Cold: Not tested Stereognosis: Not tested Coordination Gross Motor Movements are Fluid and Coordinated: No Coordination and Movement Description: decreased smoothness and accuracy Heel Shin Test: symmetrical with no significant deviations noted Motor  Motor Motor: Motor apraxia;Other (comment) Motor - Skilled Clinical Observations: generalized weakness, motor planning and coordination deficits   Mobility Bed Mobility Bed Mobility: Supine to Sit Supine to Sit: Supervision/Verbal cueing Transfers Transfers: Sit to Stand;Stand to Sit;Stand Pivot Transfers Sit to Stand: Contact Guard/Touching assist Stand to Sit: Contact Guard/Touching assist Stand Pivot Transfers: Minimal Assistance - Patient > 75% Stand Pivot Transfer Details: Tactile cues for sequencing;Verbal cues for sequencing;Visual cues/gestures for sequencing;Verbal cues for technique;Verbal cues for gait pattern Transfer (Assistive device): None Locomotion  Gait Ambulation: Yes Gait Assistance: Minimal Assistance - Patient > 75% Gait Distance (Feet): 220 Feet Assistive device: None Gait Assistance Details: Verbal cues for gait pattern;Verbal cues for technique;Tactile cues for posture Gait Gait: Yes Gait Pattern: Impaired Gait Pattern: Decreased step length - right;Decreased step length - left;Decreased trunk rotation;Shuffle;Poor foot clearance - left;Poor foot clearance - right Gait velocity: decreased Stairs / Additional Locomotion Stairs: Yes Stairs Assistance: Minimal Assistance - Patient > 75% Stair Management Technique: Two rails Number of Stairs: 12 Height of Stairs: 6 Ramp: Minimal Assistance - Patient >75% Curb: Minimal  Assistance - Patient >75% Wheelchair Mobility Wheelchair Mobility: No  Trunk/Postural Assessment  Cervical Assessment Cervical Assessment: Within Functional Limits Thoracic Assessment Thoracic Assessment: Within Functional Limits Lumbar Assessment Lumbar Assessment: Within Functional Limits Postural Control Postural Control: Deficits on evaluation Righting Reactions: impaired Protective Responses: impaired Postural Limitations: decreased  Balance Balance Balance Assessed: Yes Static Sitting Balance Static Sitting - Balance Support: Feet supported Static Sitting - Level of Assistance: 5: Stand by assistance Dynamic Sitting Balance Dynamic Sitting - Balance Support: During functional activity;Feet supported Dynamic Sitting - Level of Assistance: 5: Stand by assistance Static Standing Balance Static Standing - Balance Support: During functional activity;No upper extremity supported Static Standing - Level of Assistance: 4: Min assist Dynamic Standing Balance Dynamic Standing - Balance Support: During functional activity;No upper extremity supported Dynamic Standing - Level of Assistance: 4: Min assist Extremity Assessment      RLE Assessment RLE Assessment: Exceptions to [facility]General Strength Comments: Grossly 4+/5 throughout with testing LLE  Assessment LLE Assessment: Exceptions to [facility] General Strength Comments: Grossly 4+/5 throughout with testing    Refer to Care Plan for Long Term Goals  Recommendations for other services: Therapeutic Recreation  Pet therapy, Kitchen group and Stress management  Discharge Criteria: Patient will be discharged from PT if patient refuses treatment 3 consecutive times without medical reason, if treatment goals not met, if there is a change in medical status, if patient makes no progress towards goals or if patient is discharged from hospital.  The above assessment, treatment plan, treatment alternatives and goals were discussed and  mutually agreed upon: by patient  Tawana Scale, PT, DPT 05/15/2019, 8:49 AM

## 2019-05-15 NOTE — Progress Notes (Signed)
Occupational Therapy Session Note  Patient Details  Name: Jose Kaufman MRN: [id number] Date of Birth: [birth year]  Today's Date: 05/15/2019 OT Individual Time: 1316-1401 OT Individual Time Calculation (min): 45 min    Short Term Goals: Week 1:  OT Short Term Goal 1 (Week 1): STG=LTG 2/2 ELOS  Skilled Therapeutic Interventions/Progress Updates:    Pt completed functional mobility from his room down to the therapy gym for session.  Noted slower rate of speed during mobility.  He worked on [employer] coordination in sitting for start of the session.  He completed 9 hole peg test in 42 seconds on the left and 57 seconds on the right.  Next had him work on picking up playing cards one at a time and place them in piles according to suits.  He was able to complete with increased time using the RUE.  Had him work on [place] and catching a tennis ball.  Increased efficiency tossing the ball back with the dominant RUE but he progressed to being able to catch the ball with his right hand when he tossed it up with the left or when therapist tossed it to him with 90% accuracy.  Had him ambulate while tossing beach ball to himself as well.  Decreased ability to toss it, as he would throw it too far out in front of him, making it impossible to catch.  He could retrieve the ball from the floor with supervision.  Mobility was slower with tossing of the ball as well.  Finished session with return to the room while tossing the ball.  Once in the room had pt work on tandem walking with increased difficulty and increased cueing to follow and understand the directions.  Pt left in bed at end of session with call button and phone in reach and bed alarm in place.      Therapy Documentation Precautions:  Precautions Precautions: Fall Restrictions Weight Bearing Restrictions: No  Pain: Pain Assessment Pain Score: 0-No pain   Therapy/Group: Individual Therapy  Twain Stenseth OTR/L 05/15/2019, 3:59 PM

## 2019-05-15 NOTE — Care Management Note (Signed)
Inpatient Rehabilitation Center Individual Statement of Services  Patient Name:  Jose Kaufman  Date:  05/15/2019  Welcome to the [facility].  Our goal is to provide you with an individualized program based on your diagnosis and situation, designed to meet your specific needs.  With this comprehensive rehabilitation program, you will be expected to participate in at least 3 hours of rehabilitation therapies Monday-Friday, with modified therapy programming on the weekends.  Your rehabilitation program will include the following services:  Physical Therapy (PT), Occupational Therapy (OT), Speech Therapy (ST), 24 hour per day rehabilitation nursing, Neuropsychology, Case Management (Social Worker), Rehabilitation Medicine, Nutrition Services and Pharmacy Services  Weekly team conferences will be held on Wednesday to discuss your progress.  Your Social Worker will talk with you frequently to get your input and to update you on team discussions.  Team conferences with you and your family in attendance may also be held.  Expected length of stay: 7-10 days Overall anticipated outcome: supervision with cueing  Depending on your progress and recovery, your program may change. Your Social Worker will coordinate services and will keep you informed of any changes. Your Social Worker's name and contact numbers are listed  below.  The following services may also be recommended but are not provided by the [facility] will be made to provide these services after discharge if needed.  Arrangements include referral to agencies that provide these services.  Your insurance has been verified to be:  [place] part A Your primary doctor is:  [place]  Pertinent information will be shared with your doctor and your  insurance company.  Social Worker:  Ovidio Kin, [place] or [phone]  Information discussed with and copy given to patient by: [name], 05/15/2019, 10:55 AM

## 2019-05-15 NOTE — Progress Notes (Signed)
[place] PHYSICAL MEDICINE & REHABILITATION PROGRESS NOTE   Subjective/Complaints: Patient tolerated his first therapy.  No issues overnight  Review of systems denies chest pain shortness of breath nausea vomiting diarrhea constipation   Objective:   No results found. Recent Labs    05/14/19 1501 05/15/19 0640  WBC 8.4 8.9  HGB 13.8 14.5  HCT 40.2 42.4  PLT 218 225   Recent Labs    05/13/19 0640 05/14/19 1501  NA 138 139  K 3.7 3.5  CL 111 107  CO2 18* 24  GLUCOSE 91 97  BUN 15 16  CREATININE 1.12 1.40*  CALCIUM 8.3* 8.9    Intake/Output Summary (Last 24 hours) at 05/15/2019 0923 Last data filed at 05/15/2019 0830 Gross per 24 hour  Intake 460 ml  Output 1475 ml  Net -1015 ml     Physical Exam: Vital Signs Blood pressure (!) 141/87, pulse 76, temperature 98 F (36.7 C), temperature source Oral, resp. rate 18, height 5\' 7"  (1.702 m), weight 76.3 kg, SpO2 100 %.    General: No acute distress Mood and affect are appropriate Heart: Regular rate and rhythm no rubs murmurs or extra sounds Lungs: Clear to auscultation, breathing unlabored, no rales or wheezes Abdomen: Positive bowel sounds, soft nontender to palpation, nondistended Extremities: No clubbing, cyanosis, or edema Skin: No evidence of breakdown, no evidence of rash Neurologic: Cranial nerves II through XII intact, Sensory exam normal sensation to light touch and proprioception in bilateral upper and lower extremities Cerebellar exam normal finger to nose to finger as well as heel to shin in bilateral upper and lower extremities Mild difficulty with finger thumb opposition on the right hand Motor strength is 4/5 in the right deltoid bicep tricep grip hip flexor knee extensor ankle dorsiflexor 5/5 in left deltoid bicep tricep grip hip flexion extensor ankle dorsiflexor Musculoskeletal: Full range of motion in all 4 extremities. No joint swelling Assessment/Plan: 1. Functional deficits secondary to  left frontal infarct which require 3+ hours per day of interdisciplinary therapy in a comprehensive inpatient rehab setting.  Physiatrist is providing close team supervision and 24 hour management of active medical problems listed below.  Physiatrist and rehab team continue to assess barriers to discharge/monitor patient progress toward functional and medical goals  Care Tool:  Bathing              Bathing assist       Upper Body Dressing/Undressing Upper body dressing   What is the patient wearing?: Hospital gown only    Upper body assist Assist Level: Minimal Assistance - Patient > 75%    Lower Body Dressing/Undressing Lower body dressing            Lower body assist       Toileting Toileting    Toileting assist Assist for toileting: Independent with assistive device Assistive Device Comment: Urinal   Transfers Chair/bed transfer  Transfers assist     Chair/bed transfer assist level: Minimal Assistance - Patient > 75%     Locomotion Ambulation   Ambulation assist              Walk 10 feet activity   Assist           Walk 50 feet activity   Assist           Walk 150 feet activity   Assist           Walk 10 feet on uneven surface  activity   Assist  Wheelchair     Assist               Wheelchair 50 feet with 2 turns activity    Assist            Wheelchair 150 feet activity     Assist          Medical Problem List and Plan: 1.   Decline in self-care and mobility skills as well as speech secondary to left frontal infarct 2. Antithrombotics: -DVT/anticoagulation: Pharmaceutical: Lovenox -antiplatelet therapy: Aspirin and Brilinta 3. Pain Management: Tylenol as needed 4. Mood: Monitor adjustment may need neuropsych -antipsychotic agents: None 5. Neuropsych: This patient is capable of making decisions on his own behalf. 6. Skin/Wound Care: No  skin lesions will monitor for signs of breakdown 7. Fluids/Electrolytes/Nutrition: Monitor I's and O's, check basic metabolic package in a.m. 8. L-VA stent: Continue aspirin and Brilinta 9.HTN: Permissive hypertension, if blood pressure elevates may need to resume lisinopril and/or hydrochlorothiazide  10. Dyslipidema:]continue atorvastatin 80 mg a day    LOS: 1 days A FACE TO FACE EVALUATION WAS PERFORMED  [name] 05/15/2019, 9:23 AM

## 2019-05-16 ENCOUNTER — Inpatient Hospital Stay (HOSPITAL_COMMUNITY): Payer: Commercial Managed Care - PPO | Admitting: Occupational Therapy

## 2019-05-16 ENCOUNTER — Inpatient Hospital Stay (HOSPITAL_COMMUNITY): Payer: Commercial Managed Care - PPO | Admitting: Speech Pathology

## 2019-05-16 ENCOUNTER — Inpatient Hospital Stay (HOSPITAL_COMMUNITY): Payer: Commercial Managed Care - PPO | Admitting: Physical Therapy

## 2019-05-16 DIAGNOSIS — I6389 Other cerebral infarction: Secondary | ICD-10-CM

## 2019-05-16 MED ORDER — HYDROCORTISONE 1 % EX CREA
TOPICAL_CREAM | Freq: Every day | CUTANEOUS | Status: DC | PRN
  Administered 2019-05-16: 11:00:00 via TOPICAL
  Filled 2019-05-16: qty 28

## 2019-05-16 NOTE — Progress Notes (Signed)
Physical Therapy Session Note  Patient Details  Name: Jose Kaufman MRN: [id number] Date of Birth: [birth year]  Today's Date: 05/16/2019 PT Individual Time: 9678-9381 PT Individual Time Calculation (min): 65 min   Short Term Goals: Week 1:  PT Short Term Goal 1 (Week 1): = to LTGs based on ELOS  Skilled Therapeutic Interventions/Progress Updates:    Pt received sitting in recliner and agreeable to therapy session. Sit<>stand, no AD, with CGA for steadying throughout session. Ambulated ~138ft, no AD, to gym with CGA for steadying and pt continues to demonstrate decreased trunk rotation and arm swing in a guarded posture with slow gait speed. Performed alternate B LE foot taps on 4" step without UE support with min assist for balance and pt frequently having minor LOB when stepping up with R LE that became less frequent with increased practice. Progressed to performing R LE/L LE step up/down on 4" step without UE support, however, pt frequently pushing R UE into therapist for steadying with mod assist for balance especially when stepping back and down - pt requiring max cuing when stepping up leading with R LE and down leading with L LE demonstrating impaired motor planning. Performed cross body reaching task from low to high surfaces to place clothespins on basketball goal with CGA for balance. Sidestepping at hallway rail ~44ft down/back with B UE support and min assist for balance and pt demonstrating impaired motor planning with inconsistent step length and delayed initiation. Performed lateral side stepping over tape without UE support with pt demonstrating decreased R LE lateral step length compared to L with min/mod assist for balance and 1x L lateral LOB with mod assist to maintain upright. Performed forward/backwards stepping over tape on floor without UE support and min assist for balance - pt continues to demonstrate impaired stepping when leading with R LE compared to L via decreased step length  and increased unsteadiness. Ambulated ~170ft, no AD, back to room with cuing for increased B LE step length and increased trunk rotation/arm swing all with CGA for steadying. Pt left sitting in recliner with needs in reach and seat belt alarm on.    Therapy Documentation Precautions:  Precautions Precautions: Fall Restrictions Weight Bearing Restrictions: No  Pain:  Denies pain during session.  Therapy/Group: Individual Therapy  Tawana Scale, PT, DPT 05/16/2019, 7:43 AM

## 2019-05-16 NOTE — Plan of Care (Signed)
  Problem: Consults Goal: RH STROKE PATIENT EDUCATION Description See Patient Education module for education specifics  Outcome: Progressing   Problem: RH SKIN INTEGRITY Goal: RH STG SKIN FREE OF INFECTION/BREAKDOWN Description Skin to remain free from infection and breakdown with min assist.  Outcome: Progressing Goal: RH STG MAINTAIN SKIN INTEGRITY WITH ASSISTANCE Description STG Maintain Skin Integrity With min Assistance.  Outcome: Progressing   Problem: RH SAFETY Goal: RH STG ADHERE TO SAFETY PRECAUTIONS W/ASSISTANCE/DEVICE Description STG Adhere to Safety Precautions With min Assistance and appropriate assistive Device.  Outcome: Progressing   Problem: RH PAIN MANAGEMENT Goal: RH STG PAIN MANAGED AT OR BELOW PT'S PAIN GOAL Description <3 on a 0-10 pain scale.  Outcome: Progressing   Problem: RH KNOWLEDGE DEFICIT Goal: RH STG INCREASE KNOWLEDGE OF HYPERTENSION Description Patient will demonstrate knowledge of HTN medications, BP parameters, and follow up care with the MD post discharge with min assist.  Outcome: Progressing Goal: RH STG INCREASE KNOWLEDGE OF STROKE PROPHYLAXIS Description Patient will demonstrate knowledge of stroke medications used to prevent future strokes and follow up care with the MD post discharge with min assist.  Outcome: Progressing

## 2019-05-16 NOTE — Progress Notes (Signed)
Jose Kaufman is a 68 y.o. male admitted for CIR with mobility and speech deficits following a left frontal infarction  Past Medical History:  Diagnosis Date  . Abnormality of gait 10/15/2016  . Arthritis   . Dysrhythmia    hx of  . Essential hypertension 03/14/2017  . PVCs (premature ventricular contractions) 03/14/2017  . Skin cancer      Subjective: No new complaints. No new problems.  Remains weak but ambulatory  Objective: Vital signs in last 24 hours: Temp:  [98 F (36.7 C)-98.6 F (37 C)] 98 F (36.7 C) (06/06 0332) Pulse Rate:  [72-78] 78 (06/06 0332) Resp:  [18] 18 (06/06 0332) BP: (130-145)/(81-90) 145/90 (06/06 0332) SpO2:  [98 %-100 %] 100 % (06/06 0332) Weight change:  Last BM Date: 05/16/19  Intake/Output from previous day: 06/05 0701 - 06/06 0700 In: 684 [P.O.:684] Out: 2000 [Urine:2000]  Patient Vitals for the past 24 hrs:  BP Temp Temp src Pulse Resp SpO2  05/16/19 0332 (!) 145/90 98 F (36.7 C) Oral 78 18 100 %  05/15/19 1945 130/81 98.6 F (37 C) Oral 72 18 98 %  05/15/19 1506 (!) 143/83 98.6 F (37 C) Oral 75 18 98 %     Physical Exam General: No apparent distress   HEENT: not dry Lungs: Normal effort. Lungs clear to auscultation, no crackles or wheezes. Cardiovascular: Regular rate and rhythm, no edema Abdomen: S/NT/ND; BS(+) Musculoskeletal:  unchanged Neurological: No new neurological deficits with generalized weakness Wounds: N/A    Skin: clear   Mental state: Alert, oriented, cooperative    Lab Results: BMET    Component Value Date/Time   NA 139 05/14/2019 1501   NA 139 10/15/2016 1439   K 3.5 05/14/2019 1501   CL 107 05/14/2019 1501   CO2 24 05/14/2019 1501   GLUCOSE 97 05/14/2019 1501   BUN 16 05/14/2019 1501   BUN 23 10/15/2016 1439   CREATININE 1.40 (H) 05/14/2019 1501   CALCIUM 8.9 05/14/2019 1501   GFRNONAA 52 (L) 05/14/2019 1501   GFRAA >60 05/14/2019 1501   CBC    Component Value Date/Time   WBC 8.9  05/15/2019 0640   RBC 4.65 05/15/2019 0640   HGB 14.5 05/15/2019 0640   HCT 42.4 05/15/2019 0640   PLT 225 05/15/2019 0640   MCV 91.2 05/15/2019 0640   MCH 31.2 05/15/2019 0640   MCHC 34.2 05/15/2019 0640   RDW 12.6 05/15/2019 0640   LYMPHSABS 0.9 05/15/2019 0640   MONOABS 0.6 05/15/2019 0640   EOSABS 0.3 05/15/2019 0640   BASOSABS 0.1 05/15/2019 0640    Medications: I have reviewed the patient's current medications.  Assessment/Plan:  Functional deficits following left frontal infarction.  Continue CIR Antithrombotics/DVT anticoagulation.  Continue Lovenox and DAPT Essential hypertension.  Stable.  Continue to observe off lisinopril and HCTZ at this time Dyslipidemia continue atorvastatin     Length of stay, days: 2  Marletta Lor , MD 05/16/2019, 10:27 AM

## 2019-05-16 NOTE — Progress Notes (Signed)
Speech Language Pathology Daily Session Note  Patient Details  Name: Melvern Ramone MRN: [id number] Date of Birth: 18-Jan-1951  Today's Date: 05/16/2019 SLP Individual Time: 3646-8032 SLP Individual Time Calculation (min): 45 min  Short Term Goals: Week 1: SLP Short Term Goal 1 (Week 1): STGs=LTGs due to short length of stay   Skilled Therapeutic Interventions:  Skilled treatment session focused on cogntion and communication goals. SLP reviewed chart and unit sticky note indicating that pt requested to practice using computer in similar manner to work duties. As such, SLP provided pt with pt with computer and provided pt with activity to search for best selling adult fiction book of 2020 and then locate price on [employer]. PLOF pt worked as Paediatric nurse for [employer] and he performed such activities. Pt required Max A to navigate computer and internet interfaces. Pt unable to problem solve, sequence or organize task as well as requiring Max A to navigate computer functions (such as double clicking to open website) and internet interfaces. Pt with poor typing abilities but was able to spell all words correctly (no aphasia present during typing words) and no word finding deficits present in verbal communication. As activity progressed it became evident that pt had never used computer in this manner. After further conversation, pt uses ONLY his company's portal/database to enter serial numbers from a given CAT part and then he places the order for that part. If the part is not available then it is up to the custumer to find an after-market part. He also uses the company site for email. At baseline he does not navigate the computer for anything but weather (which he could successfully find).  His job is binary, he just a serial number, enters into the database and places the order. Unable to access company info with [employer]. Best practice would be for pt to use desktop and enter list of numbers that  are verbally read to him over phone by therapist. Pt left upright in recliner with nurse present.      Pain Pain Assessment Pain Scale: 0-10 Pain Score: 0-No pain  Therapy/Group: Individual Therapy  Oneisha Ammons 05/16/2019, 11:13 AM

## 2019-05-16 NOTE — Progress Notes (Signed)
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    He was able to navigate on computer and complete internet searches with extra time.    As well, he was issued puzzle/find a word, etc book, paper and pen so that he can practice writing with his right and and problem solving in prep for return to work  Patient asked to ly down to rest at the end of the session.  Call bell and phone were engaged.     Therapy Documentation Precautions:  Precautions Precautions: Fall Restrictions Weight Bearing Restrictions: No  Pain:denied   Therapy/Group: Individual Therapy  Alfredia Ferguson [facility] 05/16/2019, 6:18 PM

## 2019-05-17 ENCOUNTER — Inpatient Hospital Stay (HOSPITAL_COMMUNITY): Payer: Commercial Managed Care - PPO

## 2019-05-17 NOTE — Progress Notes (Signed)
Occupational Therapy Session Note  Patient Details  Name: Jose Kaufman MRN: [id number] Date of Birth: 1951/07/05  Today's Date: 05/17/2019 OT Individual Time: 1300-1330 OT Individual Time Calculation (min): 30 min    Short Term Goals: Week 1:  OT Short Term Goal 1 (Week 1): STG=LTG 2/2 ELOS  Skilled Therapeutic Interventions/Progress Updates:    Pt resting in bed upon arrival.  OT intervention with focus on bed mobility, functional amb without AD, RUE FMC, and safety awareness to increase independence with BADLs.  Pt sat EOB with supervision and amb with HHA to day room.  Pt engaged in various FMC/gross motor tasks including colored peg board, stacking poker chips, dealing playing cards, and handwriting tasks.  Pt able to write his name but mostly intelligible.  Pt challenged with making circles to replicate circles on paper.  Pt unable to trace circles on paper.  Pt completed other tasks WNL. Pt returned to room and requested to return to bed.  Pt remained in bed with all needs within reach and bed alarm activated.   Therapy Documentation Precautions:  Precautions Precautions: Fall Restrictions Weight Bearing Restrictions: No    Pain:  Pt denies pain   Therapy/Group: Individual Therapy  Leroy Libman 05/17/2019, 1:34 PM

## 2019-05-17 NOTE — Progress Notes (Signed)
Jose Kaufman is a 68 y.o. male who is admitted for CIR with speech and mobility deficits following a left frontal infarction  Past Medical History:  Diagnosis Date  . Abnormality of gait 10/15/2016  . Arthritis   . Dysrhythmia    hx of  . Essential hypertension 03/14/2017  . PVCs (premature ventricular contractions) 03/14/2017  . Skin cancer      Subjective: No new complaints. No new problems. Slept well.  Ambulating in hall.  Bowel movement earlier this a.m.  Objective: Vital signs in last 24 hours: Temp:  [97.6 F (36.4 C)-98 F (36.7 C)] 97.6 F (36.4 C) (06/07 0335) Pulse Rate:  [75-76] 75 (06/07 0335) Resp:  [16] 16 (06/07 0335) BP: (132-139)/(75-87) 132/87 (06/07 0335) SpO2:  [93 %-100 %] 98 % (06/07 0335) Weight change:  Last BM Date: 05/16/19  Intake/Output from previous day: 06/06 0701 - 06/07 0700 In: 1264 [P.O.:1264] Out: 810 [Urine:810]  Patient Vitals for the past 24 hrs:  BP Temp Temp src Pulse Resp SpO2  05/17/19 0335 132/87 97.6 F (36.4 C) Oral 75 16 98 %  05/16/19 1926 139/75 98 F (36.7 C) Oral 75 16 93 %  05/16/19 1539 134/81 97.6 F (36.4 C) Oral 76 - 100 %     Physical Exam General: No apparent distress   HEENT: not dry Lungs: Normal effort. Lungs clear to auscultation, no crackles or wheezes. Cardiovascular: Regular rate and rhythm, no edema Abdomen: S/NT/ND; BS(+) Musculoskeletal:  unchanged Neurological: No new neurological deficits with weakness Extremities.  No edema Skin: clear   Mental state: Alert, oriented, cooperative    Lab Results: BMET    Component Value Date/Time   NA 139 05/14/2019 1501   NA 139 10/15/2016 1439   K 3.5 05/14/2019 1501   CL 107 05/14/2019 1501   CO2 24 05/14/2019 1501   GLUCOSE 97 05/14/2019 1501   BUN 16 05/14/2019 1501   BUN 23 10/15/2016 1439   CREATININE 1.40 (H) 05/14/2019 1501   CALCIUM 8.9 05/14/2019 1501   GFRNONAA 52 (L) 05/14/2019 1501   GFRAA >60 05/14/2019 1501   CBC     Component Value Date/Time   WBC 8.9 05/15/2019 0640   RBC 4.65 05/15/2019 0640   HGB 14.5 05/15/2019 0640   HCT 42.4 05/15/2019 0640   PLT 225 05/15/2019 0640   MCV 91.2 05/15/2019 0640   MCH 31.2 05/15/2019 0640   MCHC 34.2 05/15/2019 0640   RDW 12.6 05/15/2019 0640   LYMPHSABS 0.9 05/15/2019 0640   MONOABS 0.6 05/15/2019 0640   EOSABS 0.3 05/15/2019 0640   BASOSABS 0.1 05/15/2019 0640    Medications: I have reviewed the patient's current medications.  Assessment/Plan:  Status post left frontal infarction with functional deficits.  Continue CIR.  Continue DAPT DVT prophylaxis.  Continue Lovenox Essential hypertension.  Stable off meds.  We will continue to observe Dyslipidemia.  Will continue statin therapy    Length of stay, days: 3  Marletta Lor , MD 05/17/2019, 9:01 AM

## 2019-05-17 NOTE — Progress Notes (Signed)
Physical Therapy Session Note  Patient Details  Name: Jose Kaufman MRN: [id number] Date of Birth: 12/07/1951  Today's Date: 05/17/2019 PT Individual Time: 0805-0900 and 4765-4650 PT Individual Time Calculation (min): 55 min and 54 min   Short Term Goals: Week 1:  PT Short Term Goal 1 (Week 1): = to LTGs based on ELOS  Skilled Therapeutic Interventions/Progress Updates:    Session 1:  Pt supine in bed upon PT arrival, agreeable to therapy tx and denies pain. Pt transferred to sitting with supervision and ambulated to the gym without AD and CGA x200 ft this session. Pt worked on dynamic standing balance this session to perform the following tasks, all without UE support and with occasional cues for safety/techniques- sidestepping in each direction 2 x 30 ft, backwards ambulation, gait with eyes closed, standing on red wedge while tossing/catching ball against trampoline rebounder, ambulating while tossing/catching ball, toe taps on aerobic step, forward step ups on aerobic step x 10 per LE, lateral step ups x 10 per LE, all with CGA-min assist. Pt transferred to quadruped on the mat with min assist. In quadruped pt performed x 10 LE extensions per LE and then x 10 UE raises per UE, working on core strength and balance, cues for techniques. Pt transferred to tall kneeling with min assist and performed sidestepping on knees in each direction, increased cues needed for this task. Pt transferred to long sitting on mat with min assist, maintained long sitting position for hamstring stretching x 1 min. Pt ambulated x 300 ft throughout unit while working on changes in gait speed and sudden stops for balance, CGA. Pt ambulated back to room and transferred to bed, left supine with needs in reach and bed alarm set.   Session 2:   Pt supine in bed upon PT arrival, agreeable to therapy tx and denies pain. Pt transferred to sitting with supervision and ambulated to the dayroom without AD and CGA x100 ft this  session. Pt used nustep this session x 6 minutes on workload 5 for global strengthening and endurance. Pt ambulated to the gym and worked on standing balance while completing pvc pipetree puzzle x 2 standing on airex with supervision. Pt ambulated to rehab apartments and performed furniture transfers on couch and recliner, both with supervision. Pt transferred to supine on the mat and performed LE strengthening exercises, 2 x 10 each with cues for techniques: brides, straight leg lifts and sidelying hip abduction. Pt instructed in supine hamstring stretch this session, performed x 30 sec per LE. Pt worked on standing balance while ambulating/navigating between cones and stepping over objects x 2 trials with CGA. Pt ascended/descended 12 steps this session with single rail, reciprocal pattern and CGA. Pt worked on Marine scientist and cognition in order to try to recall what he worked on in previous session with occupational therapy, pt able to recall a few tasks. Pt worked on dual task activity in order to name items in hallway of specific color while ambulating, CGA for balance and cues for attention. Pt left in recliner at end of session with needs in reach and chair alarm set.    Therapy Documentation Precautions:  Precautions Precautions: Fall Restrictions Weight Bearing Restrictions: No   Therapy/Group: Individual Therapy  Netta Corrigan, PT, DPT 05/17/2019, 7:57 AM

## 2019-05-18 ENCOUNTER — Inpatient Hospital Stay (HOSPITAL_COMMUNITY): Payer: Commercial Managed Care - PPO

## 2019-05-18 ENCOUNTER — Inpatient Hospital Stay (HOSPITAL_COMMUNITY): Payer: Commercial Managed Care - PPO | Admitting: Occupational Therapy

## 2019-05-18 NOTE — Progress Notes (Signed)
Physical Therapy Session Note  Patient Details  Name: Jose Kaufman MRN: [id number] Date of Birth: 1951/05/26  Today's Date: 05/18/2019 PT Individual Time: 1130-1226 and 4098-1191 PT Individual Time Calculation (min): 56 min and 59 minutes   Short Term Goals: Week 1:  PT Short Term Goal 1 (Week 1): = to LTGs based on ELOS  Skilled Therapeutic Interventions/Progress Updates:    Session 1: Pt seated in recliner upon PT arrival, agreeable to therapy tx and denies pain. Pt ambulated x 150 ft to the gym with CGA. Pt worked on standing balance and cognition in order to complete puzzle while standing on airex, min cues to correct errors. Pt worked on dynamic standing balance this session to participate in berg balance test as detailed below, pt scored 39/56 as detailed below and discussed these results with the patient. Pt worked on dynamic standing balance throughout the session to complete the following activites without UE support, CGA-min assist for balance and occasional cues for safety- standing on red wedge while tossing horseshoes x 2 trials, sidestepping in each direction, backwards ambulation, toe taps to colored cones. Pt ambulated to the dayroom x 150 ft without AD and CGA. Pt used nustep this session x 6 minutes for global strengthening and endurance on workload 5, pt reports that prior to hospital admission he did not really exercise much since his hip surgery in 2018, therapist educated pt on importance on exercise program and will develop HEP prior to d/c. Pt ambulated back to room and left in recliner with needs in reach and chair alarm set.   Session 2: Pt seated in recliner upon PT arrival, agreeable to therapy tx and denies pain. Pt ambulated x 150 ft to the gym with CGA. Pt worked on dynamic standing balance this session without AD to include the following: stepping forwards through agility ladder, sidestepping through agility adder, standing ball toss, ball toss while ambulating  forwards, mini squats on airex, standing on airex with eyes closed, standing on airex feet together eyes open, standing on airex feet together/eyes closed, standing on kinetron to perform alternating LE extension x 1 with single UE support and x 1 without UE support - all with CGA-min assist for balance and occasional cues for safety. Pt with questions this session regarding return to work and return to driving again, therapist provided education on both of these subjects. Pt ascended/descended 8 steps this session, reciprocal pattern using B handrails, cues for techniques, CGA. Pt ambulated back to room and transferred to bed. Pt left supine with needs in reach and bed alarm set.     Therapy Documentation Precautions:  Precautions Precautions: Fall Restrictions Weight Bearing Restrictions: No   Balance Standardized Balance Assessment Standardized Balance Assessment: Berg Balance Test Berg Balance Test Sit to Stand: Able to stand without using hands and stabilize independently Standing Unsupported: Able to stand 2 minutes with supervision Sitting with Back Unsupported but Feet Supported on Floor or Stool: Able to sit safely and securely 2 minutes Stand to Sit: Sits safely with minimal use of hands Transfers: Able to transfer safely, minor use of hands Standing Unsupported with Eyes Closed: Able to stand 10 seconds with supervision Standing Ubsupported with Feet Together: Able to place feet together independently and stand for 1 minute with supervision From Standing, Reach Forward with Outstretched Arm: Can reach forward >12 cm safely (5") From Standing Position, Pick up Object from Floor: Able to pick up shoe, needs supervision From Standing Position, Turn to Look Behind Over each Shoulder:  Turn sideways only but maintains balance Turn 360 Degrees: Able to turn 360 degrees safely but slowly Standing Unsupported, Alternately Place Feet on Step/Stool: Able to complete >2 steps/needs minimal  assist Standing Unsupported, One Foot in Front: Able to take small step independently and hold 30 seconds Standing on One Leg: Tries to lift leg/unable to hold 3 seconds but remains standing independently Total Score: 39   Therapy/Group: Individual Therapy  Netta Corrigan, PT, DPT 05/18/2019, 7:55 AM

## 2019-05-18 NOTE — Progress Notes (Signed)
Speech Language Pathology Daily Session Note  Patient Details  Name: Jose Kaufman MRN: [id number] Date of Birth: 01-May-1951  Today's Date: 05/18/2019 SLP Individual Time: 0900-0958 SLP Individual Time Calculation (min): 58 min  Short Term Goals: Week 1: SLP Short Term Goal 1 (Week 1): STGs=LTGs due to short length of stay   Skilled Therapeutic Interventions: Skilled ST services focused on cognitive skills. SLP facilitated functional problem solving and alternating attention in computer task, pt entered in 8 digit numbers or 5 digit numbers plus 1 letter; based on detailed job description task. Pt demonstrated ability to type 8 digit numbers read by SLP in a mildly distracting environment (pt took orders over phone) in 5 out 10 opportunities on first trial and seconds trial SLP reduced rate of read numbers, pt demonstrated accuracy in 9 out 10 opportunities. Pt demonstrated accuracy of 10 out 10 opportunities in typing 5 digit numbers and 1 letter, SLP continued reading at slower rate. Pt demonstrated ability to self-correct and request repetition of numbers if needed. SLP also facilitated word finding skills at sentence level in communication barrier task, pt required supervision A verbal cues. Pt was left in room with call bell within reach and chair alarm set. ST recommends to continue skilled ST services.      Pain Pain Assessment Pain Score: 0-No pain  Therapy/Group: Individual Therapy  Jose Kaufman  [facility] 05/18/2019, 3:32 PM

## 2019-05-18 NOTE — IPOC Note (Signed)
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 [facility])     Functional Problem List: Nursing Endurance, Medication Management, Pain, Safety, Skin Integrity  PT Balance, Safety, Endurance, Motor  OT Balance, Cognition, Endurance, Motor, Safety  SLP Cognition  TR         Basic ADL's: OT Grooming, Dressing, Bathing, Toileting     Advanced  ADL's: OT       Transfers: PT Bed Mobility, Bed to Chair, Car, [employer], Futures trader, Tub/Shower     Locomotion: PT Ambulation, Stairs     Additional Impairments: OT Fuctional Use of Upper Extremity  SLP Communication, Social Cognition expression Awareness, Problem Solving  TR      Anticipated Outcomes Item Anticipated Outcome  Self Feeding    Swallowing      Basic self-care  mod I/supervision  Toileting  Mod I   Bathroom Transfers supervision  Bowel/Bladder  modified independent   Transfers  mod-I sit<>stand and bed<>chair transfers  Locomotion  supervision ambulating with LRAD  Communication  Mod I  Cognition  Mod I   Pain  less than 3 on 0/10  Safety/Judgment  min assist    Therapy Plan: PT Intensity: Minimum of 1-2 x/day ,45 to 90 minutes PT Frequency: 5 out of 7 days PT Duration Estimated Length of Stay: 7-10 days OT Intensity: Minimum of 1-2 x/day, 45 to 90 minutes OT Frequency: 5 out of 7 days OT Duration/Estimated Length of Stay: 7-10 days SLP Intensity: Minumum of 1-2 x/day, 30 to 90 minutes SLP Frequency: 3 to 5 out of 7 days SLP Duration/Estimated Length of Stay: 7-10 days   Due to the current state of emergency, patients may not be receiving their 3-hours of Medicare-mandated therapy.   Team Interventions: Nursing Interventions Patient/Family Education, Pain Management, Skin Care/Wound  Management, Dysphagia/Aspiration Precaution Training, Psychosocial Support, Disease Management/Prevention, Medication Management, Discharge Planning  PT interventions Ambulation/gait training, Cognitive remediation/compensation, Discharge planning, DME/adaptive equipment instruction, Functional mobility training, Pain management, Psychosocial support, Splinting/orthotics, Therapeutic Activities, UE/LE Strength taining/ROM, Visual/perceptual remediation/compensation, Training and development officer, Community reintegration, Disease management/prevention, Technical sales engineer stimulation, Neuromuscular re-education, Patient/family education, IT trainer, Therapeutic Exercise, UE/LE Coordination activities  OT Interventions Training and development officer, Cognitive remediation/compensation, Academic librarian, Discharge planning, Disease mangement/prevention, Engineer, drilling, Functional mobility training, Neuromuscular re-education, Patient/family education, Self Care/advanced ADL retraining, Psychosocial support, Therapeutic Activities, Therapeutic Exercise, UE/LE Strength taining/ROM, UE/LE Coordination activities  SLP Interventions Cognitive remediation/compensation, Environmental controls, Internal/external aids, Speech/Language facilitation, Therapeutic Activities, Patient/family education, Functional tasks, Cueing hierarchy  TR Interventions    SW/CM Interventions Discharge Planning, Psychosocial Support, Patient/Family Education   Barriers to Discharge MD  Medical stability  Nursing Decreased caregiver support, Medical stability, Lack of/limited family support    PT Decreased caregiver support, Lack of/limited family support    OT      SLP      SW       Team Discharge Planning: Destination: PT-Home ,OT- Home , SLP-Home Projected Follow-up: PT-Outpatient PT, OT-  Home health OT, SLP-24 hour supervision/assistance, Outpatient SLP Projected Equipment Needs: PT-To be  determined, OT- To be determined, SLP-None recommended by SLP Equipment Details: PT- , OT-  Patient/family involved in discharge planning: PT- Patient,  OT-Patient, SLP-Patient  MD ELOS: 7-10 days Medical Rehab Prognosis:  Excellent Assessment: The patient has been admitted for CIR therapies with the diagnosis of bilateral  watershed cerebral infarcts. The team will be addressing functional mobility, strength, stamina, balance, safety, adaptive techniques and equipment, self-care, bowel and bladder mgt, patient and caregiver education, cognition,communication, NMR, community reentry. Goals have been set at supervision to mod I across all disciplines.   Due to the current state of emergency, patients may not be receiving their 3 hours per day of Medicare-mandated therapy.    Meredith Staggers, MD, FAAPMR      See Team Conference Notes for weekly updates to the plan of care

## 2019-05-18 NOTE — Progress Notes (Signed)
Occupational Therapy Session Note  Patient Details  Name: Jose Kaufman MRN: [id number] Date of Birth: 01-Jun-1951  Today's Date: 05/18/2019 OT Individual Time: 4098-1191 OT Individual Time Calculation (min): 59 min    Short Term Goals: Week 1:  OT Short Term Goal 1 (Week 1): STG=LTG 2/2 ELOS  Skilled Therapeutic Interventions/Progress Updates:    Pt completed ADL to start session.  Min guard assist for ambulation throughout the session as well as transfer into the walk-in shower.  He was able to shower sit to stand with min guard and mod instructional cueing to sequence.  Pt with disorganized pattern of washing without use of soap until cued to use it.  He transferred out to the EOB for dressing with min guard for LB dressing and slight increased time noted for donning pants and gripper socks.  He then brushed his hair at the sink with min guard in standing.  Supervision for donning pullover shirt in standing.  He next ambulated to the technology room for use of the [employer].  He was able to complete 2 sets of 2 mins in standing with reaction time at 1.74 and 1.76 seconds.  Then had pt ambulate to the ADL apartment where therapist had him step over the edge of the tub for work on balance.  Pt with some slight motor impairment figuring out how to step over initially, but he was able to complete with min guard assist.  Pt reports having a walk-in shower seat at home.  Finished session with return to the room with min guard for mobility and pt transferring to the recliner to rest.  Call button and phone in reach with alarm belt in place.    Therapy Documentation Precautions:  Precautions Precautions: Fall Restrictions Weight Bearing Restrictions: No   Pain: Pain Assessment Pain Scale: 0-10 Pain Score: 0-No pain ADL: See Care Tool Section for some details of ADL  Therapy/Group: Individual Therapy  Maze Corniel OTR/L 05/18/2019, 10:58 AM

## 2019-05-18 NOTE — Progress Notes (Signed)
[facility] PHYSICAL MEDICINE & REHABILITATION PROGRESS NOTE   Subjective/Complaints:  No issues overnite , asking about d/c date  Review of systems denies chest pain shortness of breath nausea vomiting diarrhea constipation   Objective:   No results found. No results for input(s): WBC, HGB, HCT, PLT in the last 72 hours. No results for input(s): NA, K, CL, CO2, GLUCOSE, BUN, CREATININE, CALCIUM in the last 72 hours.  Intake/Output Summary (Last 24 hours) at 05/18/2019 0801 Last data filed at 05/18/2019 0738 Gross per 24 hour  Intake 957 ml  Output 1250 ml  Net -293 ml     Physical Exam: Vital Signs Blood pressure 118/79, pulse 73, temperature 98 F (36.7 C), temperature source Oral, resp. rate 16, height 5\' 7"  (1.702 m), weight 76.3 kg, SpO2 99 %.    General: No acute distress Mood and affect are appropriate Heart: Regular rate and rhythm no rubs murmurs or extra sounds Lungs: Clear to auscultation, breathing unlabored, no rales or wheezes Abdomen: Positive bowel sounds, soft nontender to palpation, nondistended Extremities: No clubbing, cyanosis, or edema Skin: No evidence of breakdown, no evidence of rash Neurologic: Cranial nerves II through XII intact, Sensory exam normal sensation to light touch and proprioception in bilateral upper and lower extremities Cerebellar exam normal finger to nose to finger as well as heel to shin in bilateral upper and lower extremities Mild difficulty with finger thumb opposition on the right hand Motor strength is 4/5 in the right deltoid bicep tricep grip hip flexor knee extensor ankle dorsiflexor 5/5 in left deltoid bicep tricep grip hip flexion extensor ankle dorsiflexor Musculoskeletal: Full range of motion in all 4 extremities. No joint swelling Assessment/Plan: 1. Functional deficits secondary to left frontal infarct which require 3+ hours per day of interdisciplinary therapy in a comprehensive inpatient rehab setting.  Physiatrist  is providing close team supervision and 24 hour management of active medical problems listed below.  Physiatrist and rehab team continue to assess barriers to discharge/monitor patient progress toward functional and medical goals  Care Tool:  Bathing    Body parts bathed by patient: Right arm, Left arm, Chest, Abdomen, Front perineal area, Buttocks, Right upper leg, Left upper leg, Face   Body parts bathed by helper: Right lower leg, Left lower leg     Bathing assist Assist Level: Minimal Assistance - Patient > 75%     Upper Body Dressing/Undressing Upper body dressing   What is the patient wearing?: Pull over shirt    Upper body assist Assist Level: Minimal Assistance - Patient > 75%    Lower Body Dressing/Undressing Lower body dressing      What is the patient wearing?: Pants     Lower body assist Assist for lower body dressing: Moderate Assistance - Patient 50 - 74%     Toileting Toileting    Toileting assist Assist for toileting: Contact Guard/Touching assist Assistive Device Comment: Urinal   Transfers Chair/bed transfer  Transfers assist     Chair/bed transfer assist level: Contact Guard/Touching assist     Locomotion Ambulation   Ambulation assist      Assist level: Contact Guard/Touching assist Assistive device: No Device Max distance: 200 ft   Walk 10 feet activity   Assist     Assist level: Contact Guard/Touching assist Assistive device: No Device   Walk 50 feet activity   Assist    Assist level: Contact Guard/Touching assist Assistive device: No Device    Walk 150 feet activity   Assist  Assist level: Contact Guard/Touching assist Assistive device: No Device    Walk 10 feet on uneven surface  activity   Assist     Assist level: Minimal Assistance - Patient > 75%(ramp) Assistive device: Other (comment)(none)   Wheelchair     Assist Will patient use wheelchair at discharge?: No              Wheelchair 50 feet with 2 turns activity    Assist            Wheelchair 150 feet activity     Assist          Medical Problem List and Plan: 1.   Decline in self-care and mobility skills as well as speech secondary to left frontal infarct PT OT SLP 2. Antithrombotics: -DVT/anticoagulation: Pharmaceutical: Lovenox -antiplatelet therapy: Aspirin and Brilinta 3. Pain Management: Tylenol as needed 4. Mood: Monitor adjustment may need neuropsych -antipsychotic agents: None 5. Neuropsych: This patient is capable of making decisions on his own behalf. 6. Skin/Wound Care: No skin lesions will monitor for signs of breakdown 7. Fluids/Electrolytes/Nutrition: Monitor I's and O's, check basic metabolic package in a.m. 8. L-VA stent: Continue aspirin and Brilinta 9.HTN: Permissive hypertension, if blood pressure elevates may need to resume lisinopril and/or hydrochlorothiazide  Vitals:   05/17/19 1930 05/18/19 0540  BP: 122/72 118/79  Pulse: 73 73  Resp: 16 16  Temp: 98.2 F (36.8 C) 98 F (36.7 C)  SpO2: 99% 99%   10. Dyslipidema:]continue atorvastatin 80 mg a day    LOS: 4 days A FACE TO FACE EVALUATION WAS PERFORMED  Jose Kaufman 05/18/2019, 8:01 AM

## 2019-05-19 ENCOUNTER — Inpatient Hospital Stay (HOSPITAL_COMMUNITY): Payer: Commercial Managed Care - PPO | Admitting: Occupational Therapy

## 2019-05-19 ENCOUNTER — Inpatient Hospital Stay (HOSPITAL_COMMUNITY): Payer: Commercial Managed Care - PPO

## 2019-05-19 NOTE — Progress Notes (Signed)
[facility] PHYSICAL MEDICINE & REHABILITATION PROGRESS NOTE   Subjective/Complaints:  No issues overnite, working with SLP on med management and checkbook management  Review of systems denies chest pain shortness of breath nausea vomiting diarrhea constipation   Objective:   No results found. No results for input(s): WBC, HGB, HCT, PLT in the last 72 hours. No results for input(s): NA, K, CL, CO2, GLUCOSE, BUN, CREATININE, CALCIUM in the last 72 hours.  Intake/Output Summary (Last 24 hours) at 05/19/2019 0832 Last data filed at 05/19/2019 0757 Gross per 24 hour  Intake 762 ml  Output 900 ml  Net -138 ml     Physical Exam: Vital Signs Blood pressure 139/83, pulse 78, temperature 97.6 F (36.4 C), resp. rate 18, height 5\' 7"  (1.702 m), weight 76.3 kg, SpO2 99 %.    General: No acute distress Mood and affect are appropriate Heart: Regular rate and rhythm no rubs murmurs or extra sounds Lungs: Clear to auscultation, breathing unlabored, no rales or wheezes Abdomen: Positive bowel sounds, soft nontender to palpation, nondistended Extremities: No clubbing, cyanosis, or edema Skin: No evidence of breakdown, no evidence of rash Neurologic: Cranial nerves II through XII intact, Sensory exam normal sensation to light touch and proprioception in bilateral upper and lower extremities Cerebellar exam normal finger to nose to finger as well as heel to shin in bilateral upper and lower extremities Mild difficulty with finger thumb opposition on the right hand Motor strength is 4/5 in the right deltoid bicep tricep grip hip flexor knee extensor ankle dorsiflexor 5/5 in left deltoid bicep tricep grip hip flexion extensor ankle dorsiflexor Musculoskeletal: Full range of motion in all 4 extremities. No joint swelling Assessment/Plan: 1. Functional deficits secondary to left frontal infarct which require 3+ hours per day of interdisciplinary therapy in a comprehensive inpatient rehab  setting.  Physiatrist is providing close team supervision and 24 hour management of active medical problems listed below.  Physiatrist and rehab team continue to assess barriers to discharge/monitor patient progress toward functional and medical goals  Care Tool:  Bathing    Body parts bathed by patient: Right arm, Left arm, Chest, Abdomen, Front perineal area, Buttocks, Right upper leg, Left upper leg, Face, Right lower leg, Left lower leg   Body parts bathed by helper: Right lower leg, Left lower leg     Bathing assist Assist Level: Contact Guard/Touching assist     Upper Body Dressing/Undressing Upper body dressing   What is the patient wearing?: Pull over shirt    Upper body assist Assist Level: Set up assist    Lower Body Dressing/Undressing Lower body dressing      What is the patient wearing?: Pants     Lower body assist Assist for lower body dressing: Contact Guard/Touching assist     Toileting Toileting    Toileting assist Assist for toileting: Contact Guard/Touching assist Assistive Device Comment: Urinal   Transfers Chair/bed transfer  Transfers assist     Chair/bed transfer assist level: Contact Guard/Touching assist     Locomotion Ambulation   Ambulation assist      Assist level: Contact Guard/Touching assist Assistive device: No Device Max distance: 150'   Walk 10 feet activity   Assist     Assist level: Contact Guard/Touching assist Assistive device: No Device   Walk 50 feet activity   Assist    Assist level: Contact Guard/Touching assist Assistive device: No Device    Walk 150 feet activity   Assist    Assist level:  Contact Guard/Touching assist Assistive device: No Device    Walk 10 feet on uneven surface  activity   Assist     Assist level: Minimal Assistance - Patient > 75%(ramp) Assistive device: Other (comment)(none)   Wheelchair     Assist Will patient use wheelchair at discharge?: No              Wheelchair 50 feet with 2 turns activity    Assist            Wheelchair 150 feet activity     Assist          Medical Problem List and Plan: 1.   Decline in self-care and mobility skills as well as speech secondary to left frontal infarct PT OT SLP S/P R VA stent Team conf in am  2. Antithrombotics: -DVT/anticoagulation: Pharmaceutical: Lovenox -antiplatelet therapy: Aspirin and Brilinta 3. Pain Management: Tylenol as needed 4. Mood: Monitor adjustment may need neuropsych -antipsychotic agents: None 5. Neuropsych: This patient is capable of making decisions on his own behalf. 6. Skin/Wound Care: No skin lesions will monitor for signs of breakdown 7. Fluids/Electrolytes/Nutrition: Monitor I's and O's,I 1076ml 8. L-VA stent: Continue aspirin and Brilinta 9.HTN: Permissive hypertension, if blood pressure elevates may need to resume lisinopril and/or hydrochlorothiazide  Vitals:   05/18/19 1925 05/19/19 0537  BP: 127/87 139/83  Pulse: 77 78  Resp: 16 18  Temp: 98.1 F (36.7 C) 97.6 F (36.4 C)  SpO2: 95% 99%  controlled off meds 10. Dyslipidema:continue atorvastatin 80 mg a day    LOS: 5 days A FACE TO FACE EVALUATION WAS PERFORMED  [name] 05/19/2019, 8:32 AM

## 2019-05-19 NOTE — Progress Notes (Signed)
Speech Language Pathology Daily Session Note  Patient Details  Name: Jose Kaufman MRN: [id number] Date of Birth: [birth year]  Today's Date: 05/19/2019 SLP Individual Time: 8546-2703 SLP Individual Time Calculation (min): 40 min  Short Term Goals: Week 1: SLP Short Term Goal 1 (Week 1): STGs=LTGs due to short length of stay   Skilled Therapeutic Interventions: Skilled ST services focused on cognitive skills. Pt recalled pior medication (only one) and in general types of medication receiving in hospital (three.) SLP facilitated mildly complex problem solving skill utilizing personal medication management task (with only three medications), pt demonstrated Mod I for problem solving and pt required supervision A verbal cues for error awareness. Pt did require cuing for simplex problem solving, demonstrating reduced mental flexibility, such as reorienting pill organizer for easier access. SLP also facilitated complex problem solving skills with account balancing task, Pt demonstrated ability to organize credits/charges and began to completed the task by hand and independently used calculator to check answers. SLP recommends completing money management task in upcoming sessions. Pt was left in room with call bell within reach and bed alarm set. ST recommends to continue skilled ST services.      Pain Pain Assessment Pain Scale: 0-10 Pain Score: 0-No pain  Therapy/Group: Individual Therapy  Ranell Skibinski 05/19/2019, 1:01 PM

## 2019-05-19 NOTE — Progress Notes (Signed)
Occupational Therapy Session Note  Patient Details  Name: Jose Kaufman MRN: [id number] Date of Birth: 07-09-1951  Today's Date: 05/19/2019 OT Individual Time: 1420-1530 OT Individual Time Calculation (min): 70 min    Short Term Goals: Week 1:  OT Short Term Goal 1 (Week 1): STG=LTG 2/2 ELOS  Skilled Therapeutic Interventions/Progress Updates:    Pt completed bathing and dressing to start session.  Min instructional cueing for sequencing as pt attempted to wash his hair with use of the soap but no water.  He also exhibited difficulty motor planning how to pour out the soap for washing his body, onto his hand, as he initially stated at home that he did not use a washcloth.   When he could not problem solve what to do, he then asked for a washcloth and was able to use it appropriately.  He completed all bathing with supervision and then transferred out to the edge of the bed for dressing.  He completed all dressing with supervision and combed his hair in standing at the same level.  Next he ambulated to the therapy gym with noted short choppy stepping pattern with decreased arm movement.  Had him focus in standing in the gym while engaged in BUE FM coordination task of putting together bolts, nuts, and washers on a vertical board.  He was able to complete but at times demonstrated decreased ability to place the bolt through the hole with the RUE.  Progressed to completion of smaller FM activity using the [employer]urdue Peg Board with the right hand while sitting.  He was able to complete with increased time.  Finished session with ambulation while having him toss a beach ball to himself or to the therapist walking with him.  Increased difficulty with divided attention as he could not complete both at the same time.  He would barely toss the ball or had to stop walking to toss it higher.  He was successful at catching the ball with both hands however.  Pt returned to the room and transferred to the bed to rest.   Call button and phone in reach with bed alarm in place.    Therapy Documentation Precautions:  Precautions Precautions: Fall Restrictions Weight Bearing Restrictions: No   Pain: Pain Assessment Pain Scale: Faces Pain Score: 0-No pain ADL: See Care Tool for some details of ADL  Therapy/Group: Individual Therapy  Alecsander Hattabaugh OTR/L 05/19/2019, 4:27 PM

## 2019-05-19 NOTE — Progress Notes (Signed)
Physical Therapy Session Note  Patient Details  Name: Jose Kaufman MRN: [id number] Date of Birth: 08-21-1951  Today's Date: 05/19/2019 PT Individual Time: 0903-1015 PT Individual Time Calculation (min): 72 min   Short Term Goals: Week 1:  PT Short Term Goal 1 (Week 1): = to LTGs based on ELOS  Skilled Therapeutic Interventions/Progress Updates:    Pt supine in bed upon PT arrival, agreeable to therapy tx and denies pain. Pt transferred to sitting EOB with supervision and ambulated to the dayroom x 100 ft without AD and CGA. Pt participated in treadmill training this session with use of litegait harness/treadmill system in order to work on gait training with emphasis on increased gait speed and step length. Gait training on treadmill as follows:  3:15 minutes, 1.0 mph of forwards ambulation with B UE support x 268 ft with therapist providing cues throughout for increased step length Between bouts of gait pt performed Pre-gait stepping in place with emphasis on increased step length, x 10 steps in place per LE with B UE support 3:00 minutes, 1.0 mph of forwards ambulation with B UE support x 220 ft Continued working on pre-gait between bouts this time without UE support with emphasis and manual facilitation for increased step length x 5 steps per LE 2:45 minutes, 0.7-0.8 mph of forwards ambulation without UE support x 176 ft, pt with increased shuffling and fear of ambulation without UE support, cues for increased step length  Pt transferred off treadmill with min assist, doffed harness. Pt ambulated to the gym x 150 ft with CGA and cues for increased step length, pt with noticeably improved step length compared to the beginning of session prior to treadmill training. Pt continued to work on dynamic standing balance and step length in order to perform task stepping over object in place forwards x 10 per LE, min assist with cues for techniques. Pt worked on lateral stepping over object in place x 10  per LE with cues for techniques, min assist without UE support. Pt worked on dynamic balance stepping up onto airex and then stepping down and turning around x 4 trials, min assist with short steps noted with turning. Pt ambulated back to room with CGA x 150 ft and left seated in recliner with needs in reach and chair alarm set.      Therapy Documentation Precautions:  Precautions Precautions: Fall Restrictions Weight Bearing Restrictions: No    Therapy/Group: Individual Therapy  Netta Corrigan, PT, DPT 05/19/2019, 9:26 AM

## 2019-05-20 ENCOUNTER — Encounter (HOSPITAL_COMMUNITY): Payer: Commercial Managed Care - PPO | Admitting: Psychology

## 2019-05-20 ENCOUNTER — Inpatient Hospital Stay (HOSPITAL_COMMUNITY): Payer: Commercial Managed Care - PPO | Admitting: Occupational Therapy

## 2019-05-20 ENCOUNTER — Inpatient Hospital Stay (HOSPITAL_COMMUNITY): Payer: Commercial Managed Care - PPO | Admitting: Speech Pathology

## 2019-05-20 ENCOUNTER — Inpatient Hospital Stay (HOSPITAL_COMMUNITY): Payer: Commercial Managed Care - PPO

## 2019-05-20 DIAGNOSIS — R4701 Aphasia: Secondary | ICD-10-CM

## 2019-05-20 NOTE — Patient Care Conference (Signed)
Inpatient RehabilitationTeam Conference and Plan of Care Update Date: 05/20/2019   Time: 11:07 AM    Patient Name: Jose Kaufman      Medical Record Number: [id number]  Date of Birth: Nov 13, 1951 Sex: Male         Room/Bed: 4W20C/4W20C-01 Payor Info: Payor: [employer] park manager / Plan: [employer] PPO / Product Type: *No Product type* /    Admitting Diagnosis: CVA 2 Team  Lt CVA; 13-15days  Admit Date/Time:  05/14/2019  2:50 PM Admission Comments: No comment available   Primary Diagnosis:  <principal problem not specified> Principal Problem: <principal problem not specified>  Patient Active Problem List   Diagnosis Date Noted  . Non-fluent aphasia   . Acute bilateral cerebral infarction in a watershed distribution [facility]) 05/14/2019  . Vertebral artery narrowing, right 05/12/2019  . Hyperlipidemia 05/06/2019  . CVA (cerebral vascular accident) ([place]) 05/05/2019  . Internal carotid artery occlusion, bilateral   . Covid-19 Virus not Detected   . Status post total replacement of left hip 08/23/2017  . Unilateral primary osteoarthritis, left hip 05/02/2017  . Status post total replacement of right hip 03/22/2017  . PVCs (premature ventricular contractions) 03/14/2017  . Essential hypertension 03/14/2017  . Unilateral primary osteoarthritis, right hip 03/08/2017  . Bilateral hip pain 10/15/2016  . Abnormality of gait 10/15/2016    Expected Discharge Date: Expected Discharge Date: 05/23/19  Team Members Present: Physician leading conference: Dr. Alysia Penna Social Worker Present: Ovidio Kin, LCSW Nurse Present: Starlyn Skeans, LPN PT Present: Michaelene Song, PT OT Present: Clyda Greener, OT SLP Present: Stormy Fabian, SLP PPS Coordinator present : Ileana Ladd, PT     Current Status/Progress Goal Weekly Team Focus  Medical   mod A with problem solving , aphasia persists  maintain med stability, reduce fall risk  d/c planning    Bowel/Bladder   Continent B/B Last BM 05/19/2019   Remain continent  toilet q 2-3 hrs and prn    Swallow/Nutrition/ Hydration             ADL's   supervision to min guard assist for all selfcare tasks and transfers.  Slight motor planning deficits noted as well as sequencing of tasks  modified independent level  selfcare retraining, balance retraining, transfer training, neuromuscular re-education, pt/family education, therapeutic exercise.    Mobility   CGA for all mobility without an AD  supervision  balance, awareness, safety, gait, education, d/c planning   Communication   Supervision A  Mod I  word finding strategies    Safety/Cognition/ Behavioral Observations  Min-Sup A  Sup- Mod I  complex directions/problem solving, alternating attention, and anticipatory awareness   Pain   pt has no complaints of pain   remain pain free   Assess pain q shift and prn    Skin   skin is intact   Remain intact and free of breakdown and infection   Assess skin q shift and prn       *See Care Plan and progress notes for long and short-term goals.     Barriers to Discharge  Current Status/Progress Possible Resolutions Date Resolved   Physician    Medical stability     progressing toward goals  cont rehab      Nursing                  PT                    OT  SLP                SW                Discharge Planning/Teaching Needs:  Home brother to come and stay with a short time. Pt hopeful to go home soon, he is doing well in his therapies.      Team Discussion:  Goals supervision-mod/i level. Currently supervision level. Has organization and sequencing issues. Some safety issues. Decreased safety reactions. Neuro-psych saw today for coping. MD to follow up to assess to return to working and driving. Need education with his brother prior to DC home.  Revisions to Treatment Plan:  DC 6/13    Continued Need for Acute Rehabilitation Level of Care: The patient requires daily medical management by a physician with  specialized training in physical medicine and rehabilitation for the following conditions: Daily direction of a multidisciplinary physical rehabilitation program to ensure safe treatment while eliciting the highest outcome that is of practical value to the patient.: Yes Daily medical management of patient stability for increased activity during participation in an intensive rehabilitation regime.: Yes Daily analysis of laboratory values and/or radiology reports with any subsequent need for medication adjustment of medical intervention for : Neurological problems   I attest that I was present, lead the team conference, and concur with the assessment and plan of the team. Teleconference held due to COVID 19   Rebbecca Osuna, [name] 05/20/2019, 11:07 AM

## 2019-05-20 NOTE — Progress Notes (Signed)
Physical Therapy Session Note  Patient Details  Name: Jose Kaufman MRN: [id number] Date of Birth: November 29, 1951  Today's Date: 05/20/2019 PT Individual Time: 1300-1350 PT Individual Time Calculation (min): 50 min   Short Term Goals: Week 1:  PT Short Term Goal 1 (Week 1): = to LTGs based on ELOS      Skilled Therapeutic Interventions/Progress Updates:   Pt resting in recliner.  No c/o pain.  Sit> stand without use of UEs with min assis.  In standing, given external perturbations, pt lacked any balance strategies when disturbed posteriorly.  neuromuscular re-education for gait training without AD from room to gym with close supervision.  Pt has a fast gait, small step length, lacks any trunk rotation, or pelvic dissociation, and has a narrow BOS.  neuromuscular re-education in sitting,  via forced use and multimodal cues for trunk shortening/lemgthening L and R.   Therapeutic activities in supported/unsupported sitting for pelvic dissociation, "walking hips" forward/backward without use of UEs; for trunk shortening/elongating and rotating, reaching out of BOS R/L to retrieve horseshoes.    In standing, pt remains in L hip and knee flexion.  Per pt, his hip surgeon reported that he has a LL discrepancy, L longer than RLE.  Pt may benefit from a trial of heel lift in R shoe.  To return to gym without AD, max cues for "long" steps, with good carryover, but still minimal trunk rotation.  At end of session, pt resting in recliner with needs at hand and seat belt alarm set.     Therapy Documentation Precautions:  Precautions Precautions: Fall Restrictions Weight Bearing Restrictions: No   Pain: pt denies        Therapy/Group: Individual Therapy  Kjersti Dittmer 05/20/2019, 1:57 PM

## 2019-05-20 NOTE — Progress Notes (Addendum)
Speech Language Pathology Daily Session Note  Patient Details  Name: Jose Kaufman MRN: [id number] Date of Birth: [birth year]  Today's Date: 05/20/2019 SLP Individual Time: 2376-2831 SLP Individual Time Calculation (min): 45 min  Short Term Goals: Week 1: SLP Short Term Goal 1 (Week 1): STGs=LTGs due to short length of stay   Skilled Therapeutic Interventions:  Skilled treatment session focused on cognition goals. SLP faciltiated session by providing Mod A to complete semi-complex account balancing task. Specifically, pt required Mod A cues for organization of task, insight into eligible numbers, complete inability to perform any mental math, delayed processing and no insight into errors. Pt had difficulty understanding payment vs. credit and that it translated to addition/subtraction. Of note, pt with moderate difficulty coordinating RUE to produce legible numbers. Pt with no insight into need to erase number and wrote on top of previous number making all numbers illegible. At this time, pt would not be safe to be home alone d/t lengthy processing times, difficulty with RUE hand coordination, inability to comprehend/infer information such as processing a bill or why to dispute a medical bill etc. Education provided on pt's current level of function and concerns with living alone. LTGs downgraded to reflect pt's current level.       Pain Pain Assessment Pain Scale: 0-10 Pain Score: 0-No pain  Therapy/Group: Individual Therapy  [name] 05/20/2019, 11:17 AM

## 2019-05-20 NOTE — Progress Notes (Signed)
Physical Therapy Session Note  Patient Details  Name: Jose Kaufman MRN: [id number] Date of Birth: 28-Jul-1951  Today's Date: 05/20/2019 PT Individual Time: 1131-1229 PT Individual Time Calculation (min): 58 min   Short Term Goals: Week 1:  PT Short Term Goal 1 (Week 1): = to LTGs based on ELOS  Skilled Therapeutic Interventions/Progress Updates:    Pt seated in recliner upon PT arrival, agreeable to therapy tx and denies pain. Pt transferred to standing CGA and ambulated to the gym x 150 ft with CGA. Pt worked on dynamic standing balance this session without UE support to perform the following: standing on airex while throwing ball against rebounder trampoline, standing on red wedge to complete peg board puzzle, sidestepping in each direction, backwards ambulation, and four square step in all directions, cues for techniques. Pt worked on balance and backwards stepping strategies with emphasis on taking one large backwards step instead of shuffling steps back, cues for techniques and increased step length, x 1 trial with perturbations called and x 1 trial with random perturbations. Pt ambulated throughout unit x 300 ft without AD and CGA while working on dynamic balance to perform sudden stops, changes in direction and changes in gait speed as directed by therapist. Pt left in recliner at end of session with needs in reach and chair alarm set.   Therapy Documentation Precautions:  Precautions Precautions: Fall Restrictions Weight Bearing Restrictions: No  Therapy/Group: Individual Therapy  Netta Corrigan, PT, DPT 05/20/2019, 7:59 AM

## 2019-05-20 NOTE — Progress Notes (Signed)
[place] PHYSICAL MEDICINE & REHABILITATION PROGRESS NOTE   Subjective/Complaints:  No issues overnite, discussed with neuropsych  Review of systems denies chest pain shortness of breath nausea vomiting diarrhea constipation   Objective:   No results found. No results for input(s): WBC, HGB, HCT, PLT in the last 72 hours. No results for input(s): NA, K, CL, CO2, GLUCOSE, BUN, CREATININE, CALCIUM in the last 72 hours.  Intake/Output Summary (Last 24 hours) at 05/20/2019 1101 Last data filed at 05/20/2019 0711 Gross per 24 hour  Intake 620 ml  Output 1375 ml  Net -755 ml     Physical Exam: Vital Signs Blood pressure 102/64, pulse 70, temperature 98 F (36.7 C), temperature source Oral, resp. rate 16, height '5\' 7"'  (1.702 m), weight 76.4 kg, SpO2 96 %.    General: No acute distress Mood and affect are appropriate Heart: Regular rate and rhythm no rubs murmurs or extra sounds Lungs: Clear to auscultation, breathing unlabored, no rales or wheezes Abdomen: Positive bowel sounds, soft nontender to palpation, nondistended Extremities: No clubbing, cyanosis, or edema Skin: No evidence of breakdown, no evidence of rash Neurologic: Cranial nerves II through XII intact, Sensory exam normal sensation to light touch and proprioception in bilateral upper and lower extremities Cerebellar exam normal finger to nose to finger as well as heel to shin in bilateral upper and lower extremities Mild difficulty with finger thumb opposition on the right hand Motor strength is 4/5 in the right deltoid bicep tricep grip hip flexor knee extensor ankle dorsiflexor 5/5 in left deltoid bicep tricep grip hip flexion extensor ankle dorsiflexor Musculoskeletal: Full range of motion in all 4 extremities. No joint swelling Assessment/Plan: 1. Functional deficits secondary to left frontal infarct which require 3+ hours per day of interdisciplinary therapy in a comprehensive inpatient rehab  setting.  Physiatrist is providing close team supervision and 24 hour management of active medical problems listed below.  Physiatrist and rehab team continue to assess barriers to discharge/monitor patient progress toward functional and medical goals  Care Tool:  Bathing    Body parts bathed by patient: Right arm, Left arm, Chest, Abdomen, Front perineal area, Buttocks, Right upper leg, Left upper leg, Face, Right lower leg, Left lower leg   Body parts bathed by helper: Right lower leg, Left lower leg     Bathing assist Assist Level: Supervision/Verbal cueing     Upper Body Dressing/Undressing Upper body dressing   What is the patient wearing?: Pull over shirt    Upper body assist Assist Level: Set up assist    Lower Body Dressing/Undressing Lower body dressing      What is the patient wearing?: Pants     Lower body assist Assist for lower body dressing: Supervision/Verbal cueing     Toileting Toileting    Toileting assist Assist for toileting: Contact Guard/Touching assist Assistive Device Comment: Urinal   Transfers Chair/bed transfer  Transfers assist     Chair/bed transfer assist level: Contact Guard/Touching assist     Locomotion Ambulation   Ambulation assist      Assist level: Contact Guard/Touching assist Assistive device: No Device Max distance: 150'   Walk 10 feet activity   Assist     Assist level: Contact Guard/Touching assist Assistive device: No Device   Walk 50 feet activity   Assist    Assist level: Contact Guard/Touching assist Assistive device: No Device    Walk 150 feet activity   Assist    Assist level: Contact Guard/Touching assist Assistive device:  No Device    Walk 10 feet on uneven surface  activity   Assist     Assist level: Minimal Assistance - Patient > 75%(ramp) Assistive device: Other (comment)(none)   Wheelchair     Assist Will patient use wheelchair at discharge?: No              Wheelchair 50 feet with 2 turns activity    Assist            Wheelchair 150 feet activity     Assist          Medical Problem List and Plan: 1.   Decline in self-care and mobility skills as well as speech secondary to left frontal infarct PT OT SLP S/P R VA stent Team conference today please see physician documentation under team conference tab, met with team face-to-face to discuss problems,progress, and goals. Formulized individual treatment plan based on medical history, underlying problem and comorbidities. 2. Antithrombotics: -DVT/anticoagulation: Pharmaceutical: Lovenox -antiplatelet therapy: Aspirin and Brilinta 3. Pain Management: Tylenol as needed 4. Mood: Monitor adjustment may need neuropsych -antipsychotic agents: None 5. Neuropsych: This patient is capable of making decisions on his own behalf. 6. Skin/Wound Care: No skin lesions will monitor for signs of breakdown 7. Fluids/Electrolytes/Nutrition: Monitor I's and O's,I 537m, needs encouragement to drink 8. L-VA stent: Continue aspirin and Brilinta 9.HTN: Permissive hypertension, if blood pressure elevates may need to resume lisinopril and/or hydrochlorothiazide  Vitals:   05/19/19 1957 05/20/19 0517  BP: 132/82 102/64  Pulse: 74 70  Resp: 16 16  Temp: 98.3 F (36.8 C) 98 F (36.7 C)  SpO2: 99% 96%  controlled off meds 10. Dyslipidema:continue atorvastatin 80 mg a day    LOS: 6 days A FACE TO FACE EVALUATION WAS PERFORMED  ACharlett Blake6/09/2019, 11:01 AM

## 2019-05-20 NOTE — Progress Notes (Signed)
Social Work Patient ID: Jose Kaufman, male   DOB: 1951/02/26, 68 y.o.   MRN: [id number] Met with pt and spoke with brother-Andy per telephone to discuss team conference goals of supervision level and team's recommendation of 24 hr supervision at discharge. Discussed OP services and pt is agreeable. Have scheduled for brother to come in Friday at 1:00-3:00 pm. Will work on discharge needs.

## 2019-05-20 NOTE — Progress Notes (Signed)
Occupational Therapy Session Note  Patient Details  Name: Jose Kaufman MRN: [id number] Date of Birth: [birth year]  Today's Date: 05/20/2019 OT Individual Time: 8677-3736 OT Individual Time Calculation (min): 43 min    Short Term Goals: Week 1:  OT Short Term Goal 1 (Week 1): STG=LTG 2/2 ELOS  Skilled Therapeutic Interventions/Progress Updates:    Pt completed functional mobility down to the ortho gym with supervision and no assistive device.  Had him work on cognitive task of replicating a PVC pipe tree puzzle to match a diagram.   First attempt he was able to complete puzzle with only two errors.  He was able to identify and fix the errors with min questioning cueing.  The next two puzzles he was able to complete without any errors following the given diagram.  He was able to complete the hardest puzzle in 6 mins 40 seconds when he was instructed that there would be a 6 min time limit.  Finished session with ambulation back to the room and pt carrying the PVC puzzle back to the gym on the way.  Min instructional cueing for larger steps.  Pt demonstrating some difficulty with right and left discrimination as well when told to turn left at the next hall or to place the puzzle on the cart to the left.  Pt decided to stay up in his bedside chair in the room.  Call button and phone in reach with safety alarm belt in place.  He was instructed to work on Engineer, civil (consulting) and was given lined paper to practice on when he is not in therapy.  Pt reports that his writing has improved since he has been on rehab.      Therapy Documentation Precautions:  Precautions Precautions: Fall Restrictions Weight Bearing Restrictions: No  Pain: Pain Assessment Pain Scale: Faces Pain Score: 0-No pain ADL: See Care Tool Section for some details of ADL  Therapy/Group: Individual Therapy  Johnanna Bakke OTR/L 05/20/2019, 3:43 PM

## 2019-05-20 NOTE — Consult Note (Signed)
Neuropsychological Consultation   Patient:   Jose Kaufman   DOB:   [birth year]  MR Number:  [id number]  Location:  [facility] [id number] [name] [address] Dept: [place]: [phone]           Date of Service:   05/20/2019  Start Time:   10 AM End Time:   11 AM  Provider/Observer:  Ilean Skill, Psy.D.       Clinical Neuropsychologist       Billing Code/Service: [phone]  Chief Complaint:    Jose Kaufman is a 68 year old male with history of HTN, PVC, gait disorder due to prior CVA.  Admitted on 05/05/2019 with one week history of disorientation with confusion, problems walking and difficutly speaking.  MRI showed old cortical and subcortical infarcts in parietal and frontal regions with areas of acute infarct in left frontal and parietal lobe.  Patient continued with balance deficits, higher level executive functioning deficits and expressive language deficits.    Reason for Service:  The patient was referred for neuropsychological consultation due to coping and adjustment issues.  Below is the HPI for the current admission.  Jose Kaufman is a 68 year old male with history of HTN, PVCs, gait disorder who was admitted on 05/05/19 with one week history of disorientation with confusion, problems walking and difficulty speaking. MRI brain brain showed old cortical and subcortical infarcts in parietal and frontal regions with areas of acute infarct in left frontal and parietal lobe. 2 D echo showed EF 60-65% with impaired relaxation and no wall abnormality. CTA head/neck showed advanced arthrosclerotic disease at carotid bifurcation on right with near occlusion of ICA and patent string sign, 50% stenosis B-VA and 50% stenosis proximal L-SA.   Labs revealed AKI with BUN 34/SCro 1.56 andDr. Leonie Man felt that stroke watershed in nature in setting of dehydration while working  outdoors in the heat.He was started on DAPT and IR consulted for input on stenting. He underwent cerebral angiogram revealing occluded L-ICA with partial reconstitution and severe high grade stenosis of R-ICA proximally with delayed angiographic string sign and severe high grade pre-occlusive stenosis bilateral VA. He was hydrated with IVF with recommendations to keep SBP>120. He underwent right VA stent assisted angioplasty on 06/02 by Dr. Estanislado Pandy and to continue Brilinta bid with ASA 81 mg daily. He continues to be limited by balance deficits, high level cognitive and expressive deficits. CIR recommended due to functional decline.   Current Status:  Patient has continued to show improved expressive language functions.  While there were clear times of word finding deficits during visit today along with dysarthria and articulation errors, he has continued to improve with speech.  Patient reports that he is seeing improvements in motor functions as well and working on balance issues.  Had questions about return to work and return to driving.    Behavioral Observation: Jose Kaufman  presents as a 68 y.o.-year-old Right Caucasian Male who appeared his stated age. his dress was Appropriate and he was Well Groomed and his manners were Appropriate to the situation.  his participation was indicative of Appropriate and Redirectable behaviors.  There were any physical disabilities noted.  he displayed an appropriate level of cooperation and motivation.     Interactions:    Active Appropriate and Redirectable  Attention:   abnormal and attention span appeared shorter than expected for age  Memory:   within normal limits; recent and  remote memory intact  Visuo-spatial:  not examined  Speech (Volume):  normal  Speech:   non-fluent aphasia; slurred  Improving  Thought Process:  Coherent and Relevant  Though Content:  WNL; not suicidal and not homicidal  Orientation:   person, place, time/date and  situation  Judgment:   Fair  Planning:   Fair  Affect:    Appropriate  Mood:    Dysphoric  Insight:   Good  Intelligence:   normal  Medical History:   Past Medical History:  Diagnosis Date  . Abnormality of gait 10/15/2016  . Arthritis   . Dysrhythmia    hx of  . Essential hypertension 03/14/2017  . PVCs (premature ventricular contractions) 03/14/2017  . Skin cancer    Psychiatric History:  Denies past psychiatric history  Family Med/Psych History:  Family History  Problem Relation Age of Onset  . Leukemia Mother   . Pneumonia Father     Risk of Suicide/Violence: low Denies SI or HI.  Impression/DX:  Jose Kaufman is a 68 year old male with history of HTN, PVC, gait disorder due to prior CVA.  Admitted on 05/05/2019 with one week history of disorientation with confusion, problems walking and difficutly speaking.  MRI showed old cortical and subcortical infarcts in parietal and frontal regions with areas of acute infarct in left frontal and parietal lobe.  Patient continued with balance deficits, higher level executive functioning deficits and expressive language deficits.   Patient has continued to show improved expressive language functions.  While there were clear times of word finding deficits during visit today along with dysarthria and articulation errors, he has continued to improve with speech.  Patient reports that he is seeing improvements in motor functions as well and working on balance issues.  Had questions about return to work and return to driving.   Diagnosis:    Mild Non-fluent Aphasia (improving).          Electronically Signed   _______________________ Ilean Skill, Psy.D.

## 2019-05-21 ENCOUNTER — Inpatient Hospital Stay (HOSPITAL_COMMUNITY): Payer: Commercial Managed Care - PPO | Admitting: Occupational Therapy

## 2019-05-21 ENCOUNTER — Inpatient Hospital Stay (HOSPITAL_COMMUNITY): Payer: Commercial Managed Care - PPO | Admitting: Physical Therapy

## 2019-05-21 ENCOUNTER — Inpatient Hospital Stay (HOSPITAL_COMMUNITY): Payer: Commercial Managed Care - PPO | Admitting: Speech Pathology

## 2019-05-21 NOTE — Progress Notes (Signed)
[facility] PHYSICAL MEDICINE & REHABILITATION PROGRESS NOTE   Subjective/Complaints:  No new issues overnight he is looking forward to discharge tomorrow  Review of systems denies chest pain shortness of breath nausea vomiting diarrhea constipation   Objective:   No results found. No results for input(s): WBC, HGB, HCT, PLT in the last 72 hours. No results for input(s): NA, K, CL, CO2, GLUCOSE, BUN, CREATININE, CALCIUM in the last 72 hours.  Intake/Output Summary (Last 24 hours) at 05/21/2019 1606 Last data filed at 05/21/2019 1300 Gross per 24 hour  Intake 720 ml  Output 1825 ml  Net -1105 ml     Physical Exam: Vital Signs Blood pressure (!) 142/94, pulse 83, temperature 98.9 F (37.2 C), temperature source Oral, resp. rate 16, height 5\' 7"  (1.702 m), weight 76.4 kg, SpO2 99 %.    General: No acute distress Mood and affect are appropriate Heart: Regular rate and rhythm no rubs murmurs or extra sounds Lungs: Clear to auscultation, breathing unlabored, no rales or wheezes Abdomen: Positive bowel sounds, soft nontender to palpation, nondistended Extremities: No clubbing, cyanosis, or edema Skin: No evidence of breakdown, no evidence of rash Neurologic: Cranial nerves II through XII intact, Sensory exam normal sensation to light touch and proprioception in bilateral upper and lower extremities Cerebellar exam normal finger to nose to finger as well as heel to shin in bilateral upper and lower extremities Mild difficulty with finger thumb opposition on the right hand Motor strength is 4/5 in the right deltoid bicep tricep grip hip flexor knee extensor ankle dorsiflexor 5/5 in left deltoid bicep tricep grip hip flexion extensor ankle dorsiflexor Musculoskeletal: Full range of motion in all 4 extremities. No joint swelling Assessment/Plan: 1. Functional deficits secondary to left frontal infarct which require 3+ hours per day of interdisciplinary therapy in a comprehensive  inpatient rehab setting.  Physiatrist is providing close team supervision and 24 hour management of active medical problems listed below.  Physiatrist and rehab team continue to assess barriers to discharge/monitor patient progress toward functional and medical goals  Care Tool:  Bathing    Body parts bathed by patient: Right arm, Left arm, Chest, Abdomen, Front perineal area, Buttocks, Right upper leg, Left upper leg, Right lower leg, Left lower leg, Face   Body parts bathed by helper: Right lower leg, Left lower leg     Bathing assist Assist Level: Supervision/Verbal cueing     Upper Body Dressing/Undressing Upper body dressing   What is the patient wearing?: Pull over shirt    Upper body assist Assist Level: Supervision/Verbal cueing    Lower Body Dressing/Undressing Lower body dressing      What is the patient wearing?: Pants     Lower body assist Assist for lower body dressing: Supervision/Verbal cueing     Toileting Toileting    Toileting assist Assist for toileting: Contact Guard/Touching assist Assistive Device Comment: Urinal   Transfers Chair/bed transfer  Transfers assist     Chair/bed transfer assist level: Supervision/Verbal cueing     Locomotion Ambulation   Ambulation assist      Assist level: Supervision/Verbal cueing Assistive device: (No device) Max distance: 10'   Walk 10 feet activity   Assist     Assist level: Supervision/Verbal cueing Assistive device: No Device   Walk 50 feet activity   Assist    Assist level: Supervision/Verbal cueing Assistive device: No Device    Walk 150 feet activity   Assist    Assist level: Supervision/Verbal cueing Assistive device:  No Device    Walk 10 feet on uneven surface  activity   Assist     Assist level: Minimal Assistance - Patient > 75%(ramp) Assistive device: Other (comment)(none)   Wheelchair     Assist Will patient use wheelchair at discharge?: No              Wheelchair 50 feet with 2 turns activity    Assist            Wheelchair 150 feet activity     Assist          Medical Problem List and Plan: 1.   Decline in self-care and mobility skills as well as speech secondary to left frontal infarct PT OT SLP, CIR level S/P R VA stent  2. Antithrombotics: -DVT/anticoagulation: Pharmaceutical: Lovenox -antiplatelet therapy: Aspirin and Brilinta 3. Pain Management: Tylenol as needed 4. Mood: Monitor adjustment may need neuropsych -antipsychotic agents: None 5. Neuropsych: This patient is capable of making decisions on his own behalf. 6. Skin/Wound Care: No skin lesions will monitor for signs of breakdown 7. Fluids/Electrolytes/Nutrition: Monitor I's and O's,I 845ml, needs encouragement to drink 8. L-VA stent: Continue aspirin and Brilinta 9.HTN: Permissive hypertension, if blood pressure elevates may need to resume lisinopril and/or hydrochlorothiazide  Vitals:   05/21/19 0537 05/21/19 1321  BP: 115/76 (!) 142/94  Pulse: 72 83  Resp: 16 16  Temp: 98.3 F (36.8 C) 98.9 F (37.2 C)  SpO2: 98% 99%  Elevated this afternoon we will continue to monitor 10. Dyslipidema:continue atorvastatin 80 mg a day    LOS: 7 days A FACE TO FACE EVALUATION WAS PERFORMED  [name] 05/21/2019, 4:06 PM

## 2019-05-21 NOTE — Progress Notes (Signed)
Social Work  Discharge Note  The overall goal for the admission was met for: DC SAT 6/13  Discharge location: [facility]  Length of Stay: Yes-9 DAYS  Discharge activity level: Yes-SUPERVISION LEVEL  Home/community participation: Yes  Services provided included: MD, RD, PT, OT, SLP, RN, CM, Pharmacy, Neuropsych and SW  Financial Services: Medicare and Private Insurance: [facility]  Follow-up services arranged: Outpatient: [facility]-PT, OT, SP-6/15 9:45, 6/17 2:00 AND 6/29 5:00  Comments (or additional information):PT DID WELL BUT HAS SOME COGNITIVE DEFICITS AND WILL NEED ASSIST WITH MEDICATIONS AND FOR SAFETY. BROTHER HERE FOR EDUCATION FRIDAY AND SAW THE NEED HIS BROTHER HAS.  Patient/Family verbalized understanding of follow-up arrangements: Yes  Individual responsible for coordination of the follow-up plan: SELF & [name]  Confirmed correct DME delivered: Jose Kaufman 05/21/2019    Jose Kaufman, Jose Kaufman

## 2019-05-21 NOTE — Progress Notes (Signed)
Occupational Therapy Session Note  Patient Details  Name: Jose Kaufman MRN: [id number] Date of Birth: 02/11/1951  Today's Date: 05/21/2019 OT Individual Time: 5573-2202 OT Individual Time Calculation (min): 41 min    Short Term Goals: Week 1:  OT Short Term Goal 1 (Week 1): STG=LTG 2/2 ELOS  Skilled Therapeutic Interventions/Progress Updates:    Pt completed bathing and dressing during session.  He was able to ambulate to the shower and undress with supervision sit to stand.  Supervision for all bathing sit to stand with mod instructional cueing for sequencing secondary to motor planning issues.  He demonstrated difficulty organizing the shower and needed cueing to use soap.  When manipulating the hand held shower, he needed assist to place back on the holder and on frequent occasions when he would attempt to place it, he would instead just bring it back down and start rinsing again.  Supervision for dressing sit to stand from the EOB with supervision for applying deodorant and brushing his teeth at the sink.  Finished session with pt in the bedside recliner with call button and phone in reach and safety alarm belt in place.    Therapy Documentation Precautions:  Precautions Precautions: Fall Restrictions Weight Bearing Restrictions: No  Pain: Pain Assessment Pain Scale: Faces Faces Pain Scale: No hurt ADL: See Care Tool Section for some details of ADL  Therapy/Group: Individual Therapy  Adalaide Jaskolski OTR/L 05/21/2019, 3:00 PM

## 2019-05-21 NOTE — Progress Notes (Signed)
Speech Language Pathology Daily Session Note  Patient Details  Name: Jose Kaufman MRN: [id number] Date of Birth: 06/29/1951  Today's Date: 05/21/2019 SLP Individual Time: 0900-1000 SLP Individual Time Calculation (min): 60 min  Short Term Goals: Week 1: SLP Short Term Goal 1 (Week 1): STGs=LTGs due to short length of stay   Skilled Therapeutic Interventions:  Skilled treatment session focused on cognition goals. SLP aware that pt will be living alone at discharge (after his brother leaves). Therefore random complex medical management task created that had name of medication, dosage and instructions printed on pill bottle. Instructions included decreasing dosages throughout the week (such as seen with Prednisone), some were misprinted for therapeutic failure and problem solving. Task also interrupted by hypothetical phone call requesting that he return doctor's call later in day at specified time. Pt to retain this information for the remaining 10 minutes of session. After completion of medication management and cues to double check task, pt had multiple errors related to complex information on times of day and he was not able to recall hypothetical appointment. Education provided to pt on need to have 24 hour supervision for extended period of time to provide help with medication, taking pt to appointments and helping with an emergency should pt need to call 911. Pt was agreeable and voiced understanding and need for recommendation. Pt left in recliner, lap belt alarm on and all needs within reach. Continue per current plan of care.                Pain    Therapy/Group: Individual Therapy  Solina Heron 05/21/2019, 12:57 PM

## 2019-05-21 NOTE — Progress Notes (Signed)
Physical Therapy Session Note  Patient Details  Name: Jose Kaufman MRN: [id number] Date of Birth: 1951/10/31  Today's Date: 05/21/2019 PT Individual Time: 0802-0900 and 1112-1209 PT Individual Time Calculation (min): 58 min and 57 min  Short Term Goals: Week 1:  PT Short Term Goal 1 (Week 1): = to LTGs based on ELOS  Skilled Therapeutic Interventions/Progress Updates:    Session 1: Pt received supine in bed and agreeable to therapy session. Supine>sit, without bed features, with supervision. Sit<>stand no AD with CGA/close supervision throughout session. Ambulated ~121ft x2, no AD, to/from therapy gym with CGA for safety and cuing for increased step length, trunk rotation, and arm swing. Performed seated [place] twists, no weight, focusing on trunk rotation. Performed alternate B LE foot taps on 4" step with opposite arm tapping external object to train reciprocal stepping with arm swing for gait, CGA for steadying and max multimodal cuing for sequencing as pt demonstrates poor motor planning. Performed alternate high knee raise in conjunction with opposite arm raise progressed to alternate high knee raise with cross body hand taping knee to work on trunk rotation and opposite hand/knee coordinated movements - mirror feedback provided for motor planning -  CGA/min assist for steadying - despite max multimodal cuing for motor planning pt unable to coordinate therefore performed in sitting to train motor planning then progressed back to standing with pt able to perform with mod cuing and demonstrating proper motor planning with mod cuing. Progressed from opposite hand/knee tap to alternate foot tap on 4" step with opposite hand cross body reaching for external target - again performed in sitting to create motor plan then progressed to standing -  CGA/min assist for balance and max multimodal cuing for motor planning and sequencing progressed to mod cuing. Ambulated with focus on carryover of the above  motor plan with exaggerated walking and cross body reaching with max multimodal cuing and pt unable to coordinate sequencing due to every time stepping forward with L foot required shuffle step forward with R foot for balance. Ambulated back to room as described above with more cuing for increased step length, trunk rotation, and arm swing. Pt left sitting in recliner with Happi, SLP present to work with patient.  Session 2: Pt received sitting in recliner and agreeable to therapy session. Sit<>stand recliner/EOM/chair<>no AD with close supervision for safety throughout session. Ambulated ~168ft, no AD, to therapy gym with cuing for increased step length and trunk rotation - pt demonstrating increasing step length compared to AM session. Participated in boxing task focusing on trunk rotation and standing balance with wide/staggered BOS - CGA/close supervision for safety throughout - manual facilitation and multimodal cuing for increased trunk rotation. Ambulated ~113ft, no AD, to day room with manual facilitation for increased arm swing with pt demonstrating slight improvement - close supervision/intermittent CGA for safety throughout.   Performed gait training using LiteGait harness on treadmill: - 1.4minutes, 0.24mph, forwards ambulation with B UE support; cuing and manual facilitation for increased B LE step length and lateral weight shifting; continues to demonstrate decreased L LE step length compared to R LE - 3.28minutes, 0.67mph forwards ambulating with B UE support progressed to R UE support, cuing and manual facilitation for increased B LE step length and lateral weight shifting as well as increased upright trunk posture and hip extension - 4.31minutes, 0.17mph, ambulating with R UE support, continued to provide multimodal cuing and manual facilitation for increased B LE step length and lateral weightshifting - pt demonstrating increased BLE step  length   Performed ~32ft overground ambulation, no  AD, with pt demonstrating carryover of larger step length as well as increased arm swing and trunk rotation. Pt left sitting in recliner with needs in reach and seat belt alarm on.    Therapy Documentation Precautions:  Precautions Precautions: Fall Restrictions Weight Bearing Restrictions: No  Pain:   Session 1: Denies pain during session.   Session 2: Denies pain during session.   Therapy/Group: Individual Therapy  Tawana Scale, PT, DPT 05/21/2019, 7:45 AM

## 2019-05-22 ENCOUNTER — Encounter (HOSPITAL_COMMUNITY): Payer: Commercial Managed Care - PPO

## 2019-05-22 ENCOUNTER — Inpatient Hospital Stay (HOSPITAL_COMMUNITY): Payer: Commercial Managed Care - PPO | Admitting: Physical Therapy

## 2019-05-22 ENCOUNTER — Encounter (HOSPITAL_COMMUNITY): Payer: Commercial Managed Care - PPO | Admitting: Occupational Therapy

## 2019-05-22 ENCOUNTER — Inpatient Hospital Stay (HOSPITAL_COMMUNITY): Payer: Commercial Managed Care - PPO

## 2019-05-22 ENCOUNTER — Ambulatory Visit (HOSPITAL_COMMUNITY): Payer: Commercial Managed Care - PPO | Admitting: Physical Therapy

## 2019-05-22 MED ORDER — BLOOD PRESSURE CONTROL BOOK
Freq: Once | Status: AC
  Administered 2019-05-22: 11:00:00
  Filled 2019-05-22: qty 1

## 2019-05-22 MED ORDER — TICAGRELOR 90 MG PO TABS: 90.0000 mg | ORAL_TABLET | Freq: Two times a day (BID) | ORAL | 1 refills | Status: DC

## 2019-05-22 MED ORDER — SELENIUM SULFIDE 1 % EX LOTN: 1.0000 "application " | TOPICAL_LOTION | Freq: Every day | CUTANEOUS | Status: DC

## 2019-05-22 MED ORDER — ATORVASTATIN CALCIUM 80 MG PO TABS: 80.0000 mg | ORAL_TABLET | Freq: Every day | ORAL | 1 refills | Status: AC

## 2019-05-22 MED ORDER — HYDROCORTISONE 1 % EX CREA: TOPICAL_CREAM | Freq: Every day | CUTANEOUS | 0 refills | Status: DC | PRN

## 2019-05-22 NOTE — Progress Notes (Signed)
[facility] PHYSICAL MEDICINE & REHABILITATION PROGRESS NOTE   Subjective/Complaints:  Pt asking about surgery f/u.  Review of chart  Review of systems denies chest pain shortness of breath nausea vomiting diarrhea constipation   Objective:   No results found. No results for input(s): WBC, HGB, HCT, PLT in the last 72 hours. No results for input(s): NA, K, CL, CO2, GLUCOSE, BUN, CREATININE, CALCIUM in the last 72 hours.  Intake/Output Summary (Last 24 hours) at 05/22/2019 0836 Last data filed at 05/22/2019 0718 Gross per 24 hour  Intake 360 ml  Output 925 ml  Net -565 ml     Physical Exam: Vital Signs Blood pressure 124/86, pulse 75, temperature 98.8 F (37.1 C), temperature source Oral, resp. rate 17, height 5\' 7"  (1.702 m), weight 76.4 kg, SpO2 97 %.    General: No acute distress Mood and affect are appropriate Heart: Regular rate and rhythm no rubs murmurs or extra sounds Lungs: Clear to auscultation, breathing unlabored, no rales or wheezes Abdomen: Positive bowel sounds, soft nontender to palpation, nondistended Extremities: No clubbing, cyanosis, or edema Skin: No evidence of breakdown, no evidence of rash Neurologic: Cranial nerves II through XII intact, Sensory exam normal sensation to light touch and proprioception in bilateral upper and lower extremities Cerebellar exam normal finger to nose to finger as well as heel to shin in bilateral upper and lower extremities Mild difficulty with finger thumb opposition on the right hand Motor strength is 4/5 in the right deltoid bicep tricep grip hip flexor knee extensor ankle dorsiflexor 5/5 in left deltoid bicep tricep grip hip flexion extensor ankle dorsiflexor Musculoskeletal: Full range of motion in all 4 extremities. No joint swelling Assessment/Plan: 1. Functional deficits secondary to left frontal infarct which require 3+ hours per day of interdisciplinary therapy in a comprehensive inpatient rehab  setting.  Physiatrist is providing close team supervision and 24 hour management of active medical problems listed below.  Physiatrist and rehab team continue to assess barriers to discharge/monitor patient progress toward functional and medical goals  Care Tool:  Bathing    Body parts bathed by patient: Right arm, Left arm, Chest, Abdomen, Front perineal area, Buttocks, Right upper leg, Left upper leg, Right lower leg, Left lower leg, Face   Body parts bathed by helper: Right lower leg, Left lower leg     Bathing assist Assist Level: Supervision/Verbal cueing     Upper Body Dressing/Undressing Upper body dressing   What is the patient wearing?: Pull over shirt    Upper body assist Assist Level: Supervision/Verbal cueing    Lower Body Dressing/Undressing Lower body dressing      What is the patient wearing?: Pants     Lower body assist Assist for lower body dressing: Supervision/Verbal cueing     Toileting Toileting    Toileting assist Assist for toileting: Contact Guard/Touching assist Assistive Device Comment: Urinal   Transfers Chair/bed transfer  Transfers assist     Chair/bed transfer assist level: Supervision/Verbal cueing     Locomotion Ambulation   Ambulation assist      Assist level: Supervision/Verbal cueing Assistive device: (No device) Max distance: 10'   Walk 10 feet activity   Assist     Assist level: Supervision/Verbal cueing Assistive device: No Device   Walk 50 feet activity   Assist    Assist level: Supervision/Verbal cueing Assistive device: No Device    Walk 150 feet activity   Assist    Assist level: Supervision/Verbal cueing Assistive device: No Device  Walk 10 feet on uneven surface  activity   Assist     Assist level: Minimal Assistance - Patient > 75%(ramp) Assistive device: Other (comment)(none)   Wheelchair     Assist Will patient use wheelchair at discharge?: No              Wheelchair 50 feet with 2 turns activity    Assist            Wheelchair 150 feet activity     Assist          Medical Problem List and Plan: 1.   Decline in self-care and mobility skills as well as speech secondary to left frontal infarct PT OT SLP, CIR level- plan d/c in am  S/P R VA  Stent Plans for IR f/u Right VA stent, possible Left VA vs R ICA stenting   2. Antithrombotics: -DVT/anticoagulation: Pharmaceutical: Lovenox -antiplatelet therapy: Aspirin and Brilinta 3. Pain Management: Tylenol as needed 4. Mood: Monitor adjustment may need neuropsych -antipsychotic agents: None 5. Neuropsych: This patient is capable of making decisions on his own behalf. 6. Skin/Wound Care: No skin lesions will monitor for signs of breakdown 7. Fluids/Electrolytes/Nutrition: Monitor I's and O's,I 87ml, needs encouragement to drink 8. L-VA stent: Continue aspirin and Brilinta 9.HTN: Permissive hypertension, if blood pressure elevates may need to resume lisinopril and/or hydrochlorothiazide  Vitals:   05/21/19 1956 05/22/19 0440  BP: 109/70 124/86  Pulse: 75 75  Resp: 18 17  Temp: 98.9 F (37.2 C) 98.8 F (37.1 C)  SpO2: 98% 97%  Controlled  10. Dyslipidema:continue atorvastatin 80 mg a day    LOS: 8 days A FACE TO FACE EVALUATION WAS PERFORMED  Jose Kaufman 05/22/2019, 8:36 AM

## 2019-05-22 NOTE — Discharge Instructions (Signed)
Inpatient Rehab Discharge Instructions  [facility] Discharge date and time:  05/23/19  Activities/Precautions/ Functional Status: Activity: no lifting, driving, or strenuous exercise till cleared by MD Diet: cardiac diet Wound Care: keep wound clean and dry    Functional status:  ___ No restrictions     ___ Walk up steps independently _X__ 24/7 supervision/assistance   ___ Walk up steps with assistance ___ Intermittent supervision/assistance  ___ Bathe/dress independently ___ Walk with walker     ___ Bathe/dress with assistance ___ Walk Independently    ___ Shower independently ___ Walk with assistance    __X_ Shower with supervision  _X__ No alcohol     ___ Return to work/school ________   Special Instructions:   COMMUNITY REFERRALS UPON DISCHARGE:    Outpatient: PT, OT, SP  Agency:[facility] [phone]   Date of Last Service:05/23/2019   Appointment Date/Time:JUNE 15-Monday-9:45-11:00-SPT, June 17-WEDNESDAY 2:00-3:00 PM-OT, June 29-MONDAY 5:00-6:00 PM-PT  Medical Equipment/Items Ordered:HAS ALL EQUIPMENT FROM HIP REPLACMENTS     GENERAL COMMUNITY RESOURCES FOR PATIENT/FAMILY: Support Groups:[facility] SUPPORT GROUP THE SECOND Thursday OF EACH MONTH @ 6:00-7:00 PM (SEPT-MAY) ON THE REHAB UNIT QUESTIONS CALL AMY [phone]  STROKE/TIA DISCHARGE INSTRUCTIONS SMOKING Cigarette smoking nearly doubles your risk of having a stroke & is the single most alterable risk factor  If you smoke or have smoked in the last 12 months, you are advised to quit smoking for your health.  Most of the excess cardiovascular risk related to smoking disappears within a year of stopping.  Ask you doctor about anti-smoking medications  [place] Quit Line: 1-800-QUIT NOW  Free Smoking Cessation Classes [phone]  CHOLESTEROL Know your levels; limit fat & cholesterol in your diet  Lipid Panel     Component Value Date/Time   CHOL 167 05/06/2019 1602   TRIG 71 05/06/2019  1602   HDL 40 (L) 05/06/2019 1602   CHOLHDL 4.2 05/06/2019 1602   VLDL 14 05/06/2019 1602   LDLCALC 113 (H) 05/06/2019 1602      Many patients benefit from treatment even if their cholesterol is at goal.  Goal: Total Cholesterol (CHOL) less than 160  Goal:  Triglycerides (TRIG) less than 150  Goal:  HDL greater than 40  Goal:  LDL (LDLCALC) less than 100   BLOOD PRESSURE American Stroke Association blood pressure target is less that 120/80 mm/Hg  Your discharge blood pressure is:  BP: 102/64  Monitor your blood pressure  Limit your salt and alcohol intake  Many individuals will require more than one medication for high blood pressure  DIABETES (A1c is a blood sugar average for last 3 months) Goal HGBA1c is under 7% (HBGA1c is blood sugar average for last 3 months)  Diabetes: Pre-diabetes    Lab Results  Component Value Date   HGBA1C 5.7 (H) 05/05/2019     Your HGBA1c can be lowered with medications, healthy diet, and exercise.  Check your blood sugar as directed by your physician  Call your physician if you experience unexplained or low blood sugars.  PHYSICAL ACTIVITY/REHABILITATION Goal is 30 minutes at least 4 days per week  Activity: No driving, Therapies: see above Return to work: to be decided after follow up  Activity decreases your risk of heart attack and stroke and makes your heart stronger.  It helps control your weight and blood pressure; helps you relax and can improve your mood.  Participate in a regular exercise program.  Talk with your doctor about the best form of exercise for  you (dancing, walking, swimming, cycling).  DIET/WEIGHT Goal is to maintain a healthy weight  Your discharge diet is:  Diet Order            Diet Heart Room service appropriate? Yes; Fluid consistency: Thin  Diet effective now             liquids Your height is:  Height: 5\' 7"  (170.2 cm) Your current weight is: Weight: 168 lbs Your Body Mass Index (BMI) is:  BMI  (Calculated): 26.38  Following the type of diet specifically designed for you will help prevent another stroke.  Your goal weight is:  169 lbs  Your goal Body Mass Index (BMI) is 19-24.  Healthy food habits can help reduce 3 risk factors for stroke:  High cholesterol, hypertension, and excess weight.  RESOURCES Stroke/Support Group:  Call [phone]   STROKE EDUCATION PROVIDED/REVIEWED AND GIVEN TO PATIENT Stroke warning signs and symptoms How to activate emergency medical system (call 911). Medications prescribed at discharge. Need for follow-up after discharge. Personal risk factors for stroke. Pneumonia vaccine given:  Flu vaccine given:  My questions have been answered, the writing is legible, and I understand these instructions.  I will adhere to these goals & educational materials that have been provided to me after my discharge from the hospital.     My questions have been answered and I understand these instructions. I will adhere to these goals and the provided educational materials after my discharge from the hospital.  Patient/Caregiver Signature _______________________________ Date __________  Clinician Signature _______________________________________ Date __________  Please bring this form and your medication list with you to all your follow-up doctor's appointments.

## 2019-05-22 NOTE — Progress Notes (Signed)
Social Work Patient ID: Jose Kaufman, male   DOB: [birth year], 68 y.o.   MRN: [id number] Brother-Andy here to go through family education in preparation for DC tomorrow. [name] reports pt walked like this prior to admission since after his hip surgery. He feels and pt agrees he can stay alone, although the team recommends 24 hr supervision at discharge. Discussed options of hired caregivers or going to [place] for a short time. Both feel this is not necessary. Brother had told this worker he will stay with him a few days and then check on daily. Unsure what will really occur once pt is discharged home.

## 2019-05-22 NOTE — Progress Notes (Signed)
Physical Therapy Discharge Summary  Patient Details  Name: Jose Kaufman MRN: [id number] Date of Birth: 04-Nov-1951  Today's Date: 05/22/2019 PT Individual Time: 7001-7494 and 4967-5916  PT Individual Time Calculation (min): 58 min and 43 min   Patient has met 9 of 10 long term goals due to improved activity tolerance, improved balance, improved postural control, increased strength, improved attention, improved awareness and improved coordination.  Patient to discharge at an ambulatory level Supervision.   Patient's care partner, brother, is independent in providing the necessary physical and cognitive assistance at discharge; however, pt's care partner reports he is not planning to provide the recommended strict 24hr support despite frequent education during session on reasons for this recommendation.  Reasons goals not met: Patient requires min assist to perform floor transfers.  Recommendation:  Patient will benefit from ongoing skilled PT services in outpatient setting to continue to advance safe functional mobility, address ongoing impairments in standing balance, gait mechanics, higher level gait challenges,, stair navigation, motor planning, and minimize fall risk.  Equipment: No equipment provided  Reasons for discharge: treatment goals met and discharge from hospital  Patient/family agrees with progress made and goals achieved: Yes  Skilled Therapeutic Interventions/Progress Updates: Session 1: Pt received supine in bed and agreeable to therapy session. Denies pain throughout session. Supine>sit independently without bed features. Sit<>stand independently throughout session. Ambulated ~375f, no AD, with supervision and pt demonstrating carryover of improved B LE step length, arm swing, and trunk rotation as was observed during 2nd session yesterday. Education and visual demonstration provided on proper technique for car transfer. Pt performed ambulatory car transfer, no AD, with  close supervision for safety and demonstrated understanding of proper technique. Ambulated up/down ramp x2 CGA progressed to close supervision for safety. Ambulated ~156fx4 over mulch with min assist for steadying and pt demonstrating poor foot clearance with shuffling steps and toes getting caught in the mulch. Ascended/descended 1 curb step with pt encouraged not to use handrails but unable to do safely therefore required use of 1 handrail and min/mod assist for balance due to posterior lean. Ambulated ~12515fno AD, with supervision and cuing to continue with improved gait pattern as pt demonstrating return to shuffling gait. Ascended/descended 12 steps using B HRs with reciprocal stepping on ascent and step-to pattern with L LE leading on descent all with close supervision for safety - demonstrated increased reliance on B UEs on descent. Patient participated in [place] demonstrates increased fall risk as noted by score of 47/56 - see below for details. (<36= high risk for falls, close to 100%; 37-45 significant >80%; 46-51 moderate >50%; 52-55 lower >25%). Patient educated on increase in score from 39 55 47 47d educated on continued increased risk for falls and fall risk safety in the home. Ambulated ~150f52fck to room, no AD, with close supervision and CGA during higher level gait challenge of sudden start/stop on verbal command, no LOB. Pt left sitting in recliner with needs in reach and seat belt alarm on.   Session 2: Pt received sitting in recliner with brother, Jose Sangeresent for family education - pt/family agreeable to session. Pt performed sit<>stand with close supervision for safety throughout session. Session focused on family education regarding recommendation for 24hr supervision at home, close supervision for all standing/ambulating activities, as well as training on stair navigation and car transfer. Pt ambulated ~300ft49fcar transfer room with supervision - therapist frequently  reminding pt's brother to provide closer supervision for safety; however, pt's brother  reports that pt is ambulating with increased speed and gait mechanics compared to prior to hospitalization because pt's gait mechanics have been impaired since his hip surgery in 2018. Pt performed ambulatory car transfer (low sedan height to replicate pt's brother's vehicle) x2 with close supervision from therapist initially to demonstrate proper positioning for the brother and then with pt's brother providing supervision with min cuing from therapist on increasing his proximity to the pt for increased safety. Pt ambulated up/down ramp with close supervision and over 32f mulch x2 with CGA for steadying. Stepped up/down 1 curb using handrial with min assist from therapist demonstrating proper positioning for safety and then without UE support with min/mod assist for balance. Pt/family educated on using ramps in the community for increased safety as well as pt's need for CGA for community mobility. Pt/family also educated to discuss with MD when pt is allowed to drive. Ambulated ~1265f no AD, to main therapy gym with supervision. Ascended/descended 4 steps using B HRs with close supervision from therapist while providing verbal education on proper positioning for the brother - performed same task with brother providing close supervision with mod cuing for brother to stay close to pt for increased safety. Pt ambulated ~15063fno AD, back to room with close supervision. Pt/brother educated on fall risk safety in the home and calling 911. Pt performed floor transfer with min assist for balance and max cuing on sequencing of transfer despite visual demonstration and verbal explanation of task prior. Pt performed seated cross body reaching with alternate knee raise focusing on motor planning then progressed to performing same task in standing with min assist for balance and max progressed to min multimodal cuing on sequencing.  Pt/family report having no additional questions/concerns for therapist; however, pt's brother reports he will not be able to provide 24hr support despite therapist repeatedly reinforcing this recommendation during therapy session. Pt left sitting in recliner with Jose CoderLP present to continue working with patient.   PT Discharge Precautions/Restrictions Precautions Precautions: Fall Restrictions Weight Bearing Restrictions: No Pain Pain Assessment Pain Scale: 0-10 Pain Score: 0-No pain Pain Descriptors / Indicators: Other (Comment)(states no "pain" but does have some muscle soreness) Pain Intervention(s): Other (Comment)(therapy to tolerance) Vision/Perception  Perception Perception: Within Functional Limits Praxis Praxis: Impaired Praxis Impairment Details: Motor planning Praxis-Other Comments: impaired motor planning when performing opposite UE/LE tasks  Cognition Overall Cognitive Status: Impaired/Different from baseline Arousal/Alertness: Awake/alert Orientation Level: Oriented X4 Attention: Focused;Sustained Focused Attention: Appears intact Sustained Attention: Appears intact Memory: Impaired Safety/Judgment: Appears intact Sensation Sensation Light Touch: Appears Intact(intact per screen) Hot/Cold: Not tested Proprioception: Impaired by gross assessment(noticed possible impaired proprioception during alternate B LE foot taps and other LE stepping tasks during therapy) Stereognosis: Not tested Coordination Gross Motor Movements are Fluid and Coordinated: No Coordination and Movement Description: continues to demonstrate impaired gross motor movements due to limited trunk rotation/arm swing during gait and due to poor motor planning but significantly improved since initial evaluation Heel Shin Test: symmetrical with decreased speed but no significant deviations noted Motor  Motor Motor: Other (comment);Motor apraxia Motor - Discharge Observations: motor planning and  coordination deficits  Mobility Bed Mobility Bed Mobility: Supine to Sit Supine to Sit: Independent Transfers Transfers: Sit to Stand;Stand to Sit;Stand Pivot Transfers Sit to Stand: Independent Stand to Sit: Independent Stand Pivot Transfers: Independent Transfer (Assistive device): None Locomotion  Gait Ambulation: Yes Gait Assistance: Supervision/Verbal cueing Gait Distance (Feet): 300 Feet Assistive device: None Gait Assistance Details: Verbal cues for technique;Verbal cues  for gait pattern;Visual cues/gestures for sequencing Gait Gait: Yes Gait Pattern: Impaired Gait Pattern: Decreased step length - left;Decreased step length - right;Decreased stride length;Decreased trunk rotation;Poor foot clearance - left;Poor foot clearance - right(decreased B UE arm swing) Gait velocity: decreased; however, improved from initial evaluation High Level Ambulation High Level Ambulation: Sudden stops Sudden Stops: with CGA for steadying and no LOB noted Stairs / Additional Locomotion Stairs: Yes Stairs Assistance: Supervision/Verbal cueing Stair Management Technique: Two rails Number of Stairs: 12 Height of Stairs: 6 Ramp: Supervision/Verbal cueing Curb: Minimal Assistance - Patient >75%(using 1 HR) Wheelchair Mobility Wheelchair Mobility: No  Trunk/Postural Assessment  Cervical Assessment Cervical Assessment: Within Functional Limits Thoracic Assessment Thoracic Assessment: Within Functional Limits Lumbar Assessment Lumbar Assessment: Within Functional Limits Postural Control Postural Control: Deficits on evaluation Righting Reactions: continue to be impaired but with improvement noted Protective Responses: continue to be impaired but with improvement noted Postural Limitations: continue to be impaired but with improvement noted  Balance Balance Balance Assessed: Yes Standardized Balance Assessment Standardized Balance Assessment: Berg Balance Test Berg Balance Test Sit to  Stand: Able to stand without using hands and stabilize independently Standing Unsupported: Able to stand safely 2 minutes Sitting with Back Unsupported but Feet Supported on Floor or Stool: Able to sit safely and securely 2 minutes Stand to Sit: Sits safely with minimal use of hands Transfers: Able to transfer safely, minor use of hands Standing Unsupported with Eyes Closed: Able to stand 10 seconds safely Standing Ubsupported with Feet Together: Able to place feet together independently and stand 1 minute safely From Standing, Reach Forward with Outstretched Arm: Can reach confidently >25 cm (10") From Standing Position, Pick up Object from Floor: Able to pick up shoe safely and easily From Standing Position, Turn to Look Behind Over each Shoulder: Looks behind from both sides and weight shifts well(limited trunk rotation bilaterally due to poor trunk mobility) Turn 360 Degrees: Able to turn 360 degrees safely but slowly(requires 6 seconds) Standing Unsupported, Alternately Place Feet on Step/Stool: Able to complete 4 steps without aid or supervision Standing Unsupported, One Foot in Front: Able to take small step independently and hold 30 seconds Standing on One Leg: Tries to lift leg/unable to hold 3 seconds but remains standing independently Total Score: 47 Static Sitting Balance Static Sitting - Balance Support: Feet supported Static Sitting - Level of Assistance: 7: Independent Dynamic Sitting Balance Dynamic Sitting - Balance Support: During functional activity Dynamic Sitting - Level of Assistance: 6: Modified independent (Device/Increase time) Static Standing Balance Static Standing - Balance Support: During functional activity;No upper extremity supported Static Standing - Level of Assistance: 5: Stand by assistance Dynamic Standing Balance Dynamic Standing - Balance Support: During functional activity;No upper extremity supported Dynamic Standing - Level of Assistance: 5: Stand  by assistance Extremity Assessment   RLE Assessment RLE Assessment: Within Functional Limits General Strength Comments: Grossly 5/5 throughout with testing LLE Assessment LLE Assessment: Within Functional Limits General Strength Comments: Grossly 5/5 throughout with testing    Tawana Scale, PT, DPT 05/22/2019, 7:54 AM

## 2019-05-22 NOTE — Progress Notes (Signed)
Occupational Therapy Discharge Summary  Patient Details  Name: Jose Kaufman MRN: [id number] Date of Birth: September 17, 1951  Today's Date: 05/22/2019 OT Individual Time: 2585-2778 OT Individual Time Calculation (min): 41 min   Session Note:  Pt's brother in for family education.  Educated pt and his brother on current need for supervision with simulated meal prep tasks, home management, money management, and bathing tasks secondary to cognitive sequencing issues as well as motor planning deficits.  He was able to ambulate to the therapy gym with independence and complete PVC pipe puzzle with min questioning cueing to correct 4 errors made.  Pt's brother present for session as well.  Pt also completed nine hole peg test with improvement noted in FM coordination compared to last week.  See d/c eval for details.  Finished session with ambulation back to the room and pt left sitting with family present and call button and phone in reach.  Chair alarm in place.    Patient has met 10 of 10 long term goals due to improved activity tolerance, improved balance, ability to compensate for deficits, functional use of  RIGHT upper extremity, improved attention, improved awareness and improved coordination.  Patient to discharge at overall Supervision level.  Patient's care partner is independent to provide the necessary physical and cognitive assistance at discharge.    Reasons goals not met: NA  Recommendation:  Patient will benefit from ongoing skilled OT services in outpatient setting to continue to advance functional skills in the area of BADL.  Pt continues to demonstrate decreased sequencing and motor planning during selfcare tasks, meal prep, and simulated IADLs.  Recommend continued outpatient OT and initial 24 hour supervision for safety.    Equipment: No equipment provided  Reasons for discharge: treatment goals met and discharge from hospital  Patient/family agrees with progress made and goals  achieved: Yes  OT Discharge Precautions/Restrictions  Precautions Precautions: Fall Restrictions Weight Bearing Restrictions: No   Pain Pain Assessment Pain Scale: Faces Pain Score: 0-No pain Faces Pain Scale: No hurt ADL ADL Eating: Independent Where Assessed-Eating: Chair Grooming: Independent Where Assessed-Grooming: Standing at sink Upper Body Bathing: Modified independent Where Assessed-Upper Body Bathing: Shower Lower Body Bathing: Supervision/safety Where Assessed-Lower Body Bathing: Shower Upper Body Dressing: Independent Where Assessed-Upper Body Dressing: Edge of bed Lower Body Dressing: Modified independent Where Assessed-Lower Body Dressing: Edge of bed Toileting: Independent Where Assessed-Toileting: Glass blower/designer: Diplomatic Services operational officer Method: Counselling psychologist: Geophysical data processor: Modified independent Social research officer, government Method: Heritage manager: Civil engineer, contracting with back Vision Baseline Vision/History: No visual deficits Patient Visual Report: No change from baseline Vision Assessment?: No apparent visual deficits Perception  Perception: Within Functional Limits Praxis Praxis: Impaired Praxis Impairment Details: Motor planning Praxis-Other Comments: impaired motor planning when performing opposite UE/LE tasks Cognition Overall Cognitive Status: Impaired/Different from baseline Arousal/Alertness: Awake/alert Orientation Level: Oriented X4 Attention: Selective Focused Attention: Appears intact Sustained Attention: Appears intact Selective Attention: Impaired Selective Attention Impairment: Verbal basic Memory: Impaired Memory Impairment: Decreased recall of new information Decreased Short Term Memory: Functional basic;Functional complex Awareness: Impaired Awareness Impairment: Emergent impairment;Anticipatory impairment Problem Solving: Impaired Problem  Solving Impairment: Verbal complex;Functional basic;Functional complex Safety/Judgment: Appears intact Comments: Pt demonstrates some deficits with sequencing multi step tasks noted with selfcare such as bathing, oraly hygiene Sensation Sensation Light Touch: Appears Intact Hot/Cold: Appears Intact Proprioception: Appears Intact(sensation intact in BUEs) Stereognosis: Not tested Coordination Gross Motor Movements are Fluid and Coordinated: Yes Fine Motor Movements are Fluid and  Coordinated: No Coordination and Movement Description: Slight FM coordination deficits noted, especially with writing. 9 Hole Peg Test: 32 sec on the left, 41 on the right Motor  Motor Motor: Hemiplegia Motor - Discharge Observations: motor planning and coordination deficits Mobility  Bed Mobility Bed Mobility: Supine to Sit Supine to Sit: Independent Transfers Sit to Stand: Independent Stand to Sit: Independent  Trunk/Postural Assessment  Cervical Assessment Cervical Assessment: Within Functional Limits Thoracic Assessment Thoracic Assessment: Within Functional Limits Lumbar Assessment Lumbar Assessment: Within Functional Limits  Balance Balance Balance Assessed: Yes Static Sitting Balance Static Sitting - Balance Support: Feet supported Static Sitting - Level of Assistance: 7: Independent Dynamic Sitting Balance Dynamic Sitting - Balance Support: During functional activity Dynamic Sitting - Level of Assistance: 7: Independent Static Standing Balance Static Standing - Balance Support: During functional activity Static Standing - Level of Assistance: 7: Independent Dynamic Standing Balance Dynamic Standing - Balance Support: During functional activity Dynamic Standing - Level of Assistance: 7: Independent Extremity/Trunk Assessment RUE Assessment General Strength Comments: 4+/5 overall with regards to strength WFLs.  FM coordination deficits noted as well at a minimal level. LUE  Assessment LUE Assessment: Within Functional Limits   MCGUIRE,JAMES OTR/L 05/22/2019, 5:07 PM

## 2019-05-22 NOTE — Progress Notes (Signed)
Speech Language Pathology Discharge Summary  Patient Details  Name: Jose Kaufman MRN: [id number] Date of Birth: [birth year]  Today's Date: 05/22/2019 SLP Individual Time: 1345-1414, 1130-1200 SLP Individual Time Calculation (min): 29 min and 30 min   Skilled Therapeutic Interventions:    #1 Skilled ST services focused on congtive skills. SLP facilitated re-assessment of cognitive linguistic skills, utilizing subtest from Cognistat pt scored mild in short term recall, construction, calculations, following three step directions and judgement. SLP provided education on results and continued impairment in delayed processing speed, short term recall deficits/working memory deficits, safety awareness/judgement and ability to multitask. Pt agreed with current deficits and the need for supervision and continued ST services. Pt was left in room with call bell within reach and chair alarm set. ST recommends to continue skilled ST services.   #2  Skilled ST services focused on family education. Pt's brother was present for treatment session and for education pertaining to cognitive deficits and their impact on higher level functional skills. SLP provided education on memory strategies (providing hand out), deficits in executive function and higher level attention. SLP provided education on today's Cognistat scores to emphasize need for assistance in memory, judgement, higher level attention and problem solving. SLP instructed pt in education on word finding strategies, allowing pt extra time and how to provided cues. All questions were answered to satisfaction. [place] pt's brother is NOT agreeable to provided 24 hour supervision , only intermittent supervision during the day. Pt was left in room with brother, call bell within reach and bed/chair alarm set.     Patient has met 4 of 4 long term goals.  Patient to discharge at [facility] level.  Reasons goals not met:     Clinical Impression/Discharge  Summary:   Pt met 4 out 4 goals discharging at need for min-mod assistance. Pt demonstrated ability to utilize word finding strategies at sentence and conversation level with min A verbal cues. Pt scored mild in cognitive linguistic subtest from Cognistat, in memory, three step directions, construction, calculations and judgement, indicating need for 24 hour supervision and continued ST services. Pt's delayed processing and memory deficits impact ability to preform semi-complex problem solving tasks and high level attention. Education for cognitive deficits and word finding strategies was provide to pt and pt's brother, however pt's brother was noncompliant to provide 24 hour supervision despite education. Pt's brother stated that he will "check on him" throughout the day. SLp emphasized the importance of assistance with semi-complex and complex tasks, such as medication/money management, cooking and managing schedule. Pt would continue to benefit from skilled ST services in order to maximize functional independence and reduce burden of care, requiring 24 hour supervision and continue ST services.  Care Partner:  Caregiver Able to Provide Assistance: Yes(however not to the level of assistance recommended by ST)  Type of Caregiver Assistance: Physical;Cognitive(however not to the level requested by ST)  Recommendation:  24 hour supervision/assistance;Outpatient SLP  Rationale for SLP Follow Up: Maximize functional communication;Maximize cognitive function and independence;Reduce caregiver burden   Equipment: N/A   Reasons for discharge: Discharged from hospital   Patient/Family Agrees with Progress Made and Goals Achieved: Yes    Tennie Grussing  [facility] 05/22/2019, 4:02 PM

## 2019-05-23 NOTE — Progress Notes (Signed)
[facility] PHYSICAL MEDICINE & REHABILITATION PROGRESS NOTE   Subjective/Complaints:  Patient feels well.  He is eager to go home.   Objective:    Physical Exam: Vital Signs Blood pressure 122/82, pulse 72, temperature 98 F (36.7 C), temperature source Oral, resp. rate 16, height 5\' 7"  (1.702 m), weight 76.4 kg, SpO2 99 %.  No acute distress.  Chest clear to auscultation.  Cardiac exam S1 and S2 are regular.  Abdominal exam active bowel sounds, soft extremities no edema. Assessment/Plan: 1. Functional deficits secondary to left frontal infarct he is ready for discharge today.  Medical Problem List and Plan: 1.   Decline in self-care and mobility skills as well as speech secondary to left frontal infarct PT OT SLP, CIR level- plan ready for discharge.  2. Antithrombotics: Home with aspirin and Brilinta.  8. L-VA stent: Continue aspirin and Brilinta 9.HTN:  Permissive hypertension.  We will continue to follow as an outpatient. Vitals:   05/22/19 2126 05/23/19 0514  BP: 107/70 122/82  Pulse: 75 72  Resp:  16  Temp:  98 F (36.7 C)  SpO2:  99%  Controlled  10. Dyslipidema:continue atorvastatin 80 mg a day    LOS: 9 days A FACE TO FACE EVALUATION WAS PERFORMED  Bruce H Swords 05/23/2019, 8:32 AM

## 2019-05-23 NOTE — Progress Notes (Signed)
Jose Kaufman to be D/C'd per MD order. Discussed with the patient and all questions fully answered. ? VSS, Skin clean, dry and intact without evidence of skin break down, no evidence of skin tears noted. ? IV catheter discontinued intact. Site without signs and symptoms of complications. Dressing and pressure applied. ? An After Visit Summary was printed and given to the patient. Patient informed where to pickup prescriptions. ? D/c education completed with patient/family including follow up instructions, medication list, d/c activities limitations if indicated, with other d/c instructions as indicated by MD - patient able to verbalize understanding, all questions fully answered.  ? Patient instructed to return to ED, call 911, or call MD for any changes in condition.  ? Patient to be escorted via [place], and D/C home via private auto.

## 2019-05-25 ENCOUNTER — Other Ambulatory Visit: Payer: Self-pay

## 2019-05-25 ENCOUNTER — Ambulatory Visit: Payer: Commercial Managed Care - PPO | Attending: Physical Medicine & Rehabilitation | Admitting: Speech Pathology

## 2019-05-25 ENCOUNTER — Telehealth: Payer: Self-pay | Admitting: Neurology

## 2019-05-25 DIAGNOSIS — I69318 Other symptoms and signs involving cognitive functions following cerebral infarction: Secondary | ICD-10-CM | POA: Insufficient documentation

## 2019-05-25 DIAGNOSIS — R278 Other lack of coordination: Secondary | ICD-10-CM | POA: Diagnosis present

## 2019-05-25 DIAGNOSIS — R41841 Cognitive communication deficit: Secondary | ICD-10-CM | POA: Insufficient documentation

## 2019-05-25 DIAGNOSIS — R41842 Visuospatial deficit: Secondary | ICD-10-CM | POA: Diagnosis present

## 2019-05-25 DIAGNOSIS — R41840 Attention and concentration deficit: Secondary | ICD-10-CM | POA: Insufficient documentation

## 2019-05-25 DIAGNOSIS — R4701 Aphasia: Secondary | ICD-10-CM | POA: Diagnosis present

## 2019-05-25 DIAGNOSIS — R2681 Unsteadiness on feet: Secondary | ICD-10-CM | POA: Insufficient documentation

## 2019-05-25 NOTE — Therapy (Signed)
[facility] [address] [place], [name], [address] Phone: [phone]   Fax:  [phone]  Speech Language Pathology Evaluation  Patient Details  Name: Jose Kaufman MRN: [id number] Date of Birth: Jul 17, 1951 Referring Provider (SLP): Dr.Kirsteins   Encounter Date: 05/25/2019  End of Session - 05/25/19 1907    Visit Number  1    Number of Visits  17    Date for SLP Re-Evaluation  07/24/19    Authorization Type  [facility], 30 visits for OT/PT, 30 separate visits for ST    Authorization - Visit Number  1    Authorization - Number of Visits  30    SLP Start Time  1003    SLP Stop Time   1053    SLP Time Calculation (min)  50 min    Activity Tolerance  Patient tolerated treatment well       Past Medical History:  Diagnosis Date  . Abnormality of gait 10/15/2016  . Arthritis   . Dysrhythmia    hx of  . Essential hypertension 03/14/2017  . PVCs (premature ventricular contractions) 03/14/2017  . Skin cancer     Past Surgical History:  Procedure Laterality Date  . IR ANGIO INTRA EXTRACRAN SEL COM CAROTID INNOMINATE BILAT MOD SED  05/07/2019  . IR ANGIO VERTEBRAL SEL SUBCLAVIAN INNOMINATE BILAT MOD SED  05/07/2019  . IR TRANSCATH EXCRAN VERT OR CAR A STENT  05/12/2019  . IR US GUIDE VASC ACCESS RIGHT  05/12/2019  . RADIOLOGY WITH ANESTHESIA N/A 05/08/2019   Procedure: RADIOLOGY WITH ANESTHESIA;  Surgeon: [name], MD;  Location: [facility];  Service: Radiology;  Laterality: N/A;  . RADIOLOGY WITH ANESTHESIA N/A 05/12/2019   Procedure: carotid stenting;  Surgeon: [name], MD;  Location: [facility];  Service: Radiology;  Laterality: N/A;  . SKIN CANCER EXCISION    . TOTAL HIP ARTHROPLASTY Right 03/22/2017   Procedure: RIGHT TOTAL HIP ARTHROPLASTY ANTERIOR APPROACH;  Surgeon: Mcarthur Rossetti, MD;  Location: [facility];  Service: Orthopedics;  Laterality: Right;  . TOTAL HIP ARTHROPLASTY     Left 08/23/17 Dr. Zollie Beckers   . TOTAL HIP ARTHROPLASTY Left 08/23/2017   Procedure: LEFT TOTAL HIP ARTHROPLASTY ANTERIOR APPROACH;  Surgeon: Mcarthur Rossetti, MD;  Location: [facility];  Service: Orthopedics;  Laterality: Left;    There were no vitals filed for this visit.  Subjective Assessment - 05/25/19 1008    Subjective  "It's.... jumbled."    Currently in Pain?  No/denies         SLP Evaluation [facility] - 05/25/19 1008      SLP Visit Information   SLP Received On  05/25/19    Referring Provider (SLP)  Dr.Kirsteins    Onset Date  05/05/19    Medical Diagnosis  CVA      Subjective   Subjective  "I don't have problems with my memory. 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 male admitted to [facility] then CIR for acute left frontal and parietal CVA which occurred on 05/05/19. MRI also showed old cortical and subcortical infarcts in both frontal and parietal regions. Pt with resulting mild aphasia as well as mild cognitive impairments per SLP in CIR.     Behavioral/Cognition  alert, cooperative    Mobility Status  ambulated slowly  Balance Screen   Has the patient fallen in the past 6 months  --   PT eval scheduled     Prior Functional Status   Cognitive/Linguistic Baseline  Within functional limits    Type of Home  House     Lives With  Alone    Available Support  Family;Available PRN/intermittently   Brother lives nearby and checks on pt daily   Education  some college    Vocation  Full time employment   parts for [employer]   Overall Cognitive Status  Impaired/Different from baseline    Attention  Alternating;Selective    Selective Attention  --   functional with door ajar in mildly distracting environment   Alternating Attention  Impaired    Alternating Attention Impairment  --   difficulty with alternating size/shape in trailmaking   Memory  Impaired    Memory  Impairment  Other (comment)   design recall 5/6, story retell [facility] 9/10   Awareness  Impaired    Awareness Impairment  Intellectual impairment;Emergent impairment   initially denies; admits with error confrontation   Problem Solving  Impaired   slow processing vs auditory comprehension deficit   Problem Solving Impairment  --   unable to correct errors in trailmaking, mazes   Executive Function  Organizing;Self Monitoring;Self Correcting    Organizing  Impaired    Organizing Impairment  --   difficulty with clock drawing, mazes   Self Monitoring  Impaired    Self Monitoring Impairment  Functional complex    Self Correcting  Impaired    Self Correcting Impairment  Functional complex    Behaviors  Impulsive      Auditory Comprehension   Overall Auditory Comprehension  Appears within functional limits for tasks assessed   Will formally assess language in next 1-3 visits   Yes/No Questions  Within Functional Limits    Basic Biographical Questions  76-100% accurate    Commands  Within Functional Limits    Conversation  Moderately complex    Interfering Components  Processing speed    EffectiveTechniques  Extra processing time      Reading Comprehension   Reading Status  Not tested    Functional Environmental (signs, name badge)  Not tested      Expression   Primary Mode of Expression  Verbal      Verbal Expression   Overall Verbal Expression  Impaired    Initiation  Impaired   hesitations, whole and part-word repetitions intermittently   Level of Generative/Spontaneous Verbalization  Conversation    Naming  Impairment    Confrontation  75-100% accurate   10/10   Divergent  Other (comment)   4/9, below norm for age   Pragmatics  --   affect is somewhat flat   Interfering Components  Attention    Effective Techniques  Other (Comment);Semantic cues   extended time, eye contact   Other Verbal Expression Comments  hesitations, abandoned utterances, restarting/rephrasing in mod  complex conversation, overuse of fillers ("you know", "um," "uh,")       Written Expression   Dominant Hand  Right    Written Expression  Not tested   will assess further     Oral Motor/Sensory Function   Overall Oral Motor/Sensory Function  --   not formally assessed due to clinic masking procedure   Labial ROM  --   no perceptible dysarthria or apraxia     Motor Speech   Overall Motor Speech  Appears within functional limits for tasks assessed    Respiration  Within functional limits    Phonation  Normal    Resonance  Within functional limits    Articulation  Within functional limitis    Intelligibility  Intelligible    Motor Planning  Witnin functional limits    Motor Speech Errors  Not applicable      Standardized Assessments   Standardized Assessments   Cognitive Linguistic Quick Test      Cognitive Linguistic Quick Test (Ages 18-69)   Attention  Mild   147/215   Memory  WNL   164/185   Executive Function  Moderate   16/40; clock drawing is also impaired   Language  WNL   31/37   Visuospatial Skills  Mild   58/105   Severity Rating Total  16    Composite Severity Rating  13.6                      SLP Education - 05/25/19 1906    Education Details  areas of cognitive deficits, recommend no driving, brother should double check finances/medications    Person(s) Educated  Patient;Other (comment)   brother   Methods  Demonstration;Explanation    Comprehension  Verbalized understanding;Need further instruction       SLP Short Term Goals - 05/25/19 1919      SLP SHORT TERM GOAL #1   Title  Pt will maintain selective attention for 15 minutes to a functional task (such as meal planning, billpaying) a moderately distracting environment x3 visits.    Time  4    Period  Weeks    Status  New      SLP SHORT TERM GOAL #2   Title  Pt will complete further assessment of language, including reading/writing, with goals added as needed.    Time  2     Period  Weeks    Status  New      SLP SHORT TERM GOAL #3   Title  Pt will tell SLP 3 cognitive deficits.    Time  4    Period  Weeks    Status  New      SLP SHORT TERM GOAL #4   Title  Pt will utilize compensations for anomia in simple conversation with rare min A over 2 sessions    Time  4    Period  Weeks    Status  New       SLP Long Term Goals - 05/25/19 1935      SLP LONG TERM GOAL #1   Title  Pt will use external aids/notebook to manage finances, schedule, meal-planning x3 sessions.    Time  8    Period  Weeks    Status  New      SLP LONG TERM GOAL #2   Title  Pt will correct 75% of errors in functional therapy tasks with rare min A for double checking.    Time  8    Period  Weeks    Status  New      SLP LONG TERM GOAL #3   Title  Pt will use compensations functionally (slowed rate, preplanning) in phone call scenarios x3 sessions.    Time  8    Period  Weeks    Status  New      SLP LONG TERM GOAL #4   Title  Pt will utilize compensations for aphasia over 15 minute moderately complex conversation  with rare min A over 2 sessions    Time  8    Period  Weeks    Status  New       Plan - 05/25/19 1918    Clinical Impression Statement  Mr. Nawabi presents with mild-moderate cognitive communication deficits and what appears to be mild aphasia. Hesitations and disfluencies present in simple and more prominently in mod complex conversation today. SLP focused assessment on cognition today. Pt initially denied cognitive deficits, however during testing exhibited slowed processing (vs auditory comprehension) and decreased attention. Executive function skills are most significant impairment; pt struggled with planning, organization, and problem solving in non-linguistic tasks. Pt had poor awareness of cognitive deficit areas and of errors. Pt is currently living alone; brother checks on pt daily. He is cooking (microwave only), and reportedly managing finances and medications  without assistance. Given performance during cognitive tasks today, SLP advised supervision or at least double checking of these tasks. Pt hopes to return to work; he is concerned about his ability to communicate on the phone as he must field phone calls frequently as a part of his job. I recommend skilled ST to address cognitive and language impairments in order to improve safety, independence and for possible return to work.    Speech Therapy Frequency  2x / week    Duration  --   8 weeks or 17 visits   Treatment/Interventions  Compensatory strategies;Patient/family education;Functional tasks;Cueing hierarchy;Environmental controls;Cognitive reorganization;Multimodal communcation approach;Internal/external aids;Compensatory techniques;Language facilitation;SLP instruction and feedback    Potential to Achieve Goals  Good    Potential Considerations  Ability to learn/carryover information    Consulted and Agree with Plan of Care  Patient;Family member/caregiver    Family Member Consulted  brother       Patient will benefit from skilled therapeutic intervention in order to improve the following deficits and impairments:   1. Cognitive communication deficit   2. Aphasia       Problem List Patient Active Problem List   Diagnosis Date Noted  . Non-fluent aphasia   . Acute bilateral cerebral infarction in a watershed distribution [facility]) 05/14/2019  . Vertebral artery narrowing, right 05/12/2019  . Hyperlipidemia 05/06/2019  . CVA (cerebral vascular accident) ([place]) 05/05/2019  . Internal carotid artery occlusion, bilateral   . Covid-19 Virus not Detected   . Status post total replacement of left hip 08/23/2017  . Unilateral primary osteoarthritis, left hip 05/02/2017  . Status post total replacement of right hip 03/22/2017  . PVCs (premature ventricular contractions) 03/14/2017  . Essential hypertension 03/14/2017  . Unilateral primary osteoarthritis, right hip 03/08/2017  . Bilateral hip  pain 10/15/2016  . Abnormality of gait 10/15/2016   Deneise Lever, [place], [place] 05/25/2019, 7:37 PM  [facility] [address] [place], [name], [address] Phone: [phone]   Fax:  [phone]  Name: Jose Kaufman MRN: [id number] Date of Birth: [birth year]

## 2019-05-25 NOTE — Telephone Encounter (Signed)
Yes f/u with me 4-6 weeks post hospital

## 2019-05-25 NOTE — Telephone Encounter (Signed)
Dr. Leonie Man Patient called in to schedule a hosp f/u with you stated that he was told to but I did not see a referral in epic and I did not see in your note that patient is to f/u w stroke clinic, do we need to schedule this patient for a hospital f/u and if so is it ok to sched him with Janett Billow or does he need to see you?

## 2019-05-25 NOTE — Addendum Note (Signed)
Addended by: Aliene Altes on: 05/25/2019 07:39 PM   Modules accepted: Orders

## 2019-05-27 ENCOUNTER — Ambulatory Visit: Payer: Commercial Managed Care - PPO | Admitting: Occupational Therapy

## 2019-05-27 ENCOUNTER — Other Ambulatory Visit: Payer: Self-pay

## 2019-05-27 DIAGNOSIS — R2681 Unsteadiness on feet: Secondary | ICD-10-CM

## 2019-05-27 DIAGNOSIS — R278 Other lack of coordination: Secondary | ICD-10-CM

## 2019-05-27 DIAGNOSIS — R41841 Cognitive communication deficit: Secondary | ICD-10-CM | POA: Diagnosis not present

## 2019-05-27 DIAGNOSIS — I69318 Other symptoms and signs involving cognitive functions following cerebral infarction: Secondary | ICD-10-CM

## 2019-05-27 DIAGNOSIS — R41840 Attention and concentration deficit: Secondary | ICD-10-CM

## 2019-05-27 DIAGNOSIS — R41842 Visuospatial deficit: Secondary | ICD-10-CM

## 2019-05-27 NOTE — Therapy (Signed)
[facility] [address] [place], [name], [address] Phone: [phone]   Fax:  [phone]  Occupational Therapy Evaluation  Patient Details  Name: Jose Kaufman MRN: [id number] Date of Birth: 06/22/1951 Referring Provider (OT): Dr. Letta Pate   Encounter Date: 05/27/2019  OT End of Session - 05/27/19 1532    Visit Number  1    Number of Visits  17    Date for OT Re-Evaluation  07/27/19    Authorization Type  [employer] U[employer]    Authorization - Visit Number  1    Authorization - Number of Visits  15    OT Start Time  1400    OT Stop Time  1500    OT Time Calculation (min)  60 min    Activity Tolerance  Patient tolerated treatment well    Behavior During Therapy  [facility] for tasks assessed/performed       Past Medical History:  Diagnosis Date  . Abnormality of gait 10/15/2016  . Arthritis   . Dysrhythmia    hx of  . Essential hypertension 03/14/2017  . PVCs (premature ventricular contractions) 03/14/2017  . Skin cancer     Past Surgical History:  Procedure Laterality Date  . IR ANGIO INTRA EXTRACRAN SEL COM CAROTID INNOMINATE BILAT MOD SED  05/07/2019  . IR ANGIO VERTEBRAL SEL SUBCLAVIAN INNOMINATE BILAT MOD SED  05/07/2019  . IR TRANSCATH EXCRAN VERT OR CAR A STENT  05/12/2019  . IR US GUIDE VASC ACCESS RIGHT  05/12/2019  . RADIOLOGY WITH ANESTHESIA N/A 05/08/2019   Procedure: RADIOLOGY WITH ANESTHESIA;  Surgeon: [name], MD;  Location: [facility];  Service: Radiology;  Laterality: N/A;  . RADIOLOGY WITH ANESTHESIA N/A 05/12/2019   Procedure: carotid stenting;  Surgeon: [name], MD;  Location: [facility];  Service: Radiology;  Laterality: N/A;  . SKIN CANCER EXCISION    . TOTAL HIP ARTHROPLASTY Right 03/22/2017   Procedure: RIGHT TOTAL HIP ARTHROPLASTY ANTERIOR APPROACH;  Surgeon: Mcarthur Rossetti, MD;  Location: [facility];  Service: Orthopedics;  Laterality: Right;  . TOTAL HIP ARTHROPLASTY     Left 08/23/17 Dr.  Zollie Beckers  . TOTAL HIP ARTHROPLASTY Left 08/23/2017   Procedure: LEFT TOTAL HIP ARTHROPLASTY ANTERIOR APPROACH;  Surgeon: Mcarthur Rossetti, MD;  Location: [facility];  Service: Orthopedics;  Laterality: Left;    There were no vitals filed for this visit.  Subjective Assessment - 05/27/19 1408    Pertinent History  05/05/19: acute Lt frontal and parietal watershed infarcts. Pt also had old corticol and subcortical infarcts in both frontal and parietal regions, Bil THA 2018, HTN    Limitations  No driving, not currently working, no heavy lifting, fall risk    Currently in Pain?  No/denies        [facility] OT Assessment - 05/27/19 0001      Assessment   Medical Diagnosis  acute watershed infarcts in Lt frontal and parietal lobes    Referring Provider (OT)  Dr. Letta Pate    Onset Date/Surgical Date  05/05/19    Hand Dominance  Right    Prior Therapy  inpatient rehab 05/14/19      Precautions   Precautions  Fall;Other (comment)    Precaution Comments  no driving, no heavy lifting      Balance Screen   Has the patient fallen in the past 6 months  No    Has the patient had a decrease in activity level because of a fear of  falling?   No    Is the patient reluctant to leave their home because of a fear of falling?   No      Home  Environment   Astronomer;Door   shower stool   Additional Comments  Pt lives alone in 1 story home with 3 steps to enter. 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, taking orders, looks up parts    Leisure  watching sports      ADL   Eating/Feeding  Independent    Grooming  Modified independent   more awkward, Copy   Upper Body Bathing  Modified independent    Lower Body Bathing  Modified independent     Upper Body Dressing  Increased time    Lower Body Dressing  Increased time    Sales executive -  Hygiene  Independent      IADL   Shopping  Needs to be accompanied on any shopping trip   assist from brother   Light Housekeeping  Performs light daily tasks such as dishwashing, bed making;Does personal laundry completely    Meal Prep  Able to complete simple cold meal and snack prep   Only does microwaveable dinners currently   Nurse, children's  Relies on family or friends for transportation    Medication Management  Is responsible for taking medication in correct dosages at correct time      Mobility   Mobility Status  Independent      Written Expression   Dominant Hand  Right    Handwriting  90% legible   some difficulty forming letters noted     Vision - History   Baseline Vision  Wears glasses for distance only    Additional Comments  no changes per pt report      Cognition   Attention  Alternating;Selective    Alternating Attention  Impaired    Memory  Impaired   3/5 delayed recall on MOCA   Awareness  Impaired    Awareness Impairment  Intellectual impairment;Emergent impairment   IMPAIRED   Problem Solving  Impaired    Organizing  Impaired    MOCA  18/30 total with deficits in visuospatial (2/5), memory (3/5), Attention (3/6), and language 0/3    Cognition Comments  Pt with decreased awareness into deficits. Pt also did not complete far Rt or far Lt of trail making tasks (on MOCA and trail making A test) and required questioning cues/prompts to complete      Sensation   Light Touch  Appears Intact   but could not verbalize which finger - had to point to it      Coordination   Gross Motor Movements are Fluid and Coordinated  No   on Rt   Fine Motor Movements are Fluid and Coordinated  No   bilaterally   9 Hole Peg Test  Right;Left    Right 9 Hole Peg Test  38.47 sec    Left 9 Hole Peg  Test  33.93 sec      Perception   Perception  Impaired    Inattention/Neglect  Impaired - to be further tested  in functual context   decreased attention to both far sides during paper tasks   Spatial Orientation  impaired      Edema   Edema  none      ROM / Strength   AROM / PROM / Strength  AROM;Strength      AROM   Overall AROM Comments  BUE AROM WNL's (noted Lt SF swan neck deformity - premorbid)      Strength   Overall Strength Comments  BUE MMT grossly 5/5       Hand Function   Right Hand Grip (lbs)  65 lbs    Left Hand Grip (lbs)  65 lbs                        OT Short Term Goals - 05/27/19 1538      OT SHORT TERM GOAL #1   Title  Independent with HEP for coordination - 06/26/19    Time  4    Period  Weeks    Status  New      OT SHORT TERM GOAL #2   Title  Improve coordination bilaterally by 5 sec. or more    Baseline  Rt = 38.47 sec, Lt = 33.93 sec    Time  4    Period  Weeks    Status  New      OT SHORT TERM GOAL #3   Title  Pt to perform tabletop visual scanning to far Rt and Lt side with no more than min questioning cues    Time  4    Period  Weeks    Status  New      OT SHORT TERM GOAL #4   Title  Pt to perform environmental scanning at 75% accuracy in prep for potential driving    Time  4    Period  Weeks    Status  New        OT Long Term Goals - 05/27/19 1543      OT LONG TERM GOAL #1   Title  Pt to perform simple stovetop meal safely with distant supervision - 07/27/19    Time  8    Period  Weeks    Status  New      OT LONG TERM GOAL #2   Title  Pt to perform environmental scanning at 90% accuracy while performing simple physical task for multi-tasking and in prep for driving    Time  8    Period  Weeks    Status  New      OT LONG TERM GOAL #3   Title  Pt to assemble pipe design in prep for functional assembly projects at home w/o cues    Time  8    Period  Weeks    Status  New      OT LONG TERM GOAL #4   Title   Pt to perform computer tasks including searches at mod I level    Time  8    Period  Weeks    Status  New      OT LONG TERM GOAL #5   Title  Typing goal TBD    Time  8    Period  Weeks    Status  New            Plan - 05/27/19 1534    Clinical Impression Statement  Pt is a 68 y.o. male who presents to outpatient  rehab s/p acute Lt frontal and parietal watershed infarcts. MRI also showed old cortical and subcortical infarcts in both frontal and parietal regions. Pt demo decreased bilateral [place], decreased visual/perceptual and spatial reasoning, and decr. cognition including: awareness, attention, executive functioning and some memory deficits.    OT Occupational Profile and History  Detailed Assessment- Review of Records and additional review of physical, cognitive, psychosocial history related to current functional performance    Occupational performance deficits (Please refer to evaluation for details):  ADL's;IADL's;Work;Social Participation    Body Structure / Function / Physical Skills  IADL;Sensation;Strength;UE functional use;FMC;Coordination;GMC;Balance    Cognitive Skills  Attention;Safety Awareness;Sequencing;Understand;Memory;Perception;Problem Solve    Rehab Potential  Fair    Clinical Decision Making  Several treatment options, min-mod task modification necessary    Comorbidities Affecting Occupational Performance:  May have comorbidities impacting occupational performance    Modification or Assistance to Complete Evaluation   Min-Moderate modification of tasks or assist with assess necessary to complete eval    OT Frequency  2x / week    OT Duration  8 weeks    OT Treatment/Interventions  Self-care/ADL training;Therapeutic exercise;Functional Mobility Training;Neuromuscular education;Manual Therapy;Therapeutic activities;DME and/or AE instruction;Cognitive remediation/compensation;Visual/perceptual remediation/compensation;Patient/family education;Passive range of motion     Plan  coordination HEP, visual/perceptual skills, assess typing and make goal prn    Consulted and Agree with Plan of Care  Patient       Patient will benefit from skilled therapeutic intervention in order to improve the following deficits and impairments:   Body Structure / Function / Physical Skills: IADL, Sensation, Strength, UE functional use, FMC, Coordination, GMC, Balance Cognitive Skills: Attention, Safety Awareness, Sequencing, Understand, Memory, Perception, Problem Solve     Visit Diagnosis: 1. Other symptoms and signs involving cognitive functions following cerebral infarction   2. Visuospatial deficit   3. Other lack of coordination   4. Unsteadiness on feet   5. Attention and concentration deficit       Problem List Patient Active Problem List   Diagnosis Date Noted  . Non-fluent aphasia   . Acute bilateral cerebral infarction in a watershed distribution [facility]) 05/14/2019  . Vertebral artery narrowing, right 05/12/2019  . Hyperlipidemia 05/06/2019  . CVA (cerebral vascular accident) ([place]) 05/05/2019  . Internal carotid artery occlusion, bilateral   . Covid-19 Virus not Detected   . Status post total replacement of left hip 08/23/2017  . Unilateral primary osteoarthritis, left hip 05/02/2017  . Status post total replacement of right hip 03/22/2017  . PVCs (premature ventricular contractions) 03/14/2017  . Essential hypertension 03/14/2017  . Unilateral primary osteoarthritis, right hip 03/08/2017  . Bilateral hip pain 10/15/2016  . Abnormality of gait 10/15/2016    Carey Bullocks, OTR/L 05/27/2019, 3:49 PM  [facility] [address] [place], [name], [address] Phone: [phone]   Fax:  [phone]  Name: Jose Kaufman MRN: [id number] Date of Birth: 11-26-1951

## 2019-05-27 NOTE — Discharge Summary (Signed)
Physician Discharge Summary  Patient ID: Jose Kaufman MRN: [id number] DOB/AGE: 1951-04-25 68 y.o.  Admit date: 05/14/2019 Discharge date: 05/23/2019  Discharge Diagnoses:  Principal Problem:   Acute bilateral cerebral infarction in a watershed distribution [facility]) Active Problems:   Essential hypertension   Non-fluent aphasia   Discharged Condition: Stable   Significant Diagnostic Studies: N/A   Labs:  Basic Metabolic Panel: BMP Latest Ref Rng & Units 05/14/2019 05/13/2019 05/08/2019  Glucose 70 - 99 mg/dL 97 91 107(H)  BUN 8 - 23 mg/dL 16 15 21   Creatinine 0.61 - 1.24 mg/dL 1.40(H) 1.12 1.28(H)  BUN/Creat Ratio 10 - 24 - - -  Sodium 135 - 145 mmol/L 139 138 140  Potassium 3.5 - 5.1 mmol/L 3.5 3.7 4.0  Chloride 98 - 111 mmol/L 107 111 109  CO2 22 - 32 mmol/L 24 18(L) 23  Calcium 8.9 - 10.3 mg/dL 8.9 8.3(L) 8.7(L)    CBC: CBC Latest Ref Rng & Units 05/15/2019 05/14/2019 05/13/2019  WBC 4.0 - 10.5 K/uL 8.9 8.4 9.5  Hemoglobin 13.0 - 17.0 g/dL 14.5 13.8 13.3  Hematocrit 39.0 - 52.0 % 42.4 40.2 39.3  Platelets 150 - 400 K/uL 225 218 215    CBG: No results for input(s): GLUCAP in the last 168 hours.  Brief HPI:   Jose Kaufman is a 68 year old male with history of HTN, PVCs, gait disorder who was admitted on 05/05/19 with one week history of disorientation with confusion, problem walking and difficulty speaking MIR brain done revealing acute infarct in left frontal and parietal lobe. CTA head/nec showed advanced atherosclerosis disease at carotid bifurcation on right with near occlusion of ICA with patent string sign. He was noted to be dehydrated and Dr. Leonie Man felt that watershed stroke due to dehydration in while working outdoors in the heat. He was treated with IVF with recommendations to keep BP > 120. He was started on DAPT and underwent right VA stent assisted angioplasty on 06/02 by Dr. Estanislado Pandy with recommendations to continue ASA/Brillinta. He continued to be limited by balance  deficits, high level cognitive deficits and expressive deficits. CIR was recommended due to functional deficit.     Hospital Course: Jose Kaufman was admitted to rehab 05/14/2019 for inpatient therapies to consist of PT, ST and OT at least three hours five days a week. Past admission physiatrist, therapy team and rehab RN have worked together to provide customized collaborative inpatient rehab. He was maintained on ASA and plavix for L-VA stent and follow up CBC showed that platelets are stable. His blood pressures were monitored on tid basis and are currently stable off medications. His po intake has been good and he is continent of bowel and bladder. He continues on high dose Lipitor due to dyslipidemia.     Rehab course: During patient's stay in rehab weekly team conferences were held to monitor patient's progress, set goals and discuss barriers to discharge. At admission, patient required min assist for ADL tasks and with mobility. He exhibited mild aphasia affecting verbal fluency and complex comprehension as well as mild cognitive deficits. He  has had improvement in activity tolerance, balance, postural control as well as ability to compensate for deficits. He is able to complete ADL tasks with supervision. He is independent for transfers and is able to ambulate 200' with supervision. He continues to have delay in processing with word finding deficits and requires in assist for semi complex tasks. Family education was completed with brother regarding all aspects of care and  safety.    Disposition: Home  Diet: Heart Healthy  Special Instructions: 1. No driving or strenuous activity till cleared by MD.  2. Will need follow up BMET in a couple of weeks.   Discharge Instructions    Ambulatory referral to Physical Medicine Rehab   Complete by: As directed    1-2 weeks transitional care appt     Allergies as of 05/23/2019      Reactions   Penicillins Other (See Comments)   Childhood  reaction. Has patient had a PCN reaction causing immediate rash, facial/tongue/throat swelling, SOB or lightheadedness with hypotension: unsure Has patient had a PCN reaction causing severe rash involving mucus membranes or skin necrosis: unsure Has patient had a PCN reaction that required hospitalization:No Has patient had a PCN reaction occurring within the last 10 years:No If all of the above answers are "NO", then may proceed with Cephalosporin use      Medication List    STOP taking these medications   ferrous sulfate 325 (65 FE) MG tablet   FISH OIL PO   lisinopril-hydrochlorothiazide 10-12.5 MG tablet Commonly known as: ZESTORETIC     TAKE these medications   acetaminophen 325 MG tablet Commonly known as: TYLENOL Take 650 mg by mouth every 6 (six) hours as needed for mild pain.   aspirin EC 81 MG tablet Take 81 mg by mouth daily.   atorvastatin 80 MG tablet Commonly known as: LIPITOR Take 1 tablet (80 mg total) by mouth daily at 6 PM.   hydrocortisone cream 1 % Apply topically daily as needed for itching.   selenium sulfide 1 % Lotn Commonly known as: SELSUN Apply 1 application topically daily.   thiamine 100 MG tablet Commonly known as: VITAMIN B-1 Take 100 mg by mouth 2 (two) times a week.   ticagrelor 90 MG Tabs tablet Commonly known as: BRILINTA Take 1 tablet (90 mg total) by mouth 2 (two) times daily.      Follow-up Information    Kirsteins, Luanna Salk, MD Follow up.   Specialty: Physical Medicine and Rehabilitation Why: Office will call for follow up appointment Contact information: [name] [address] [phone]        [name], MD. Call on 05/25/2019.   Specialties: Interventional Radiology, Radiology Why: for follow up appointment Contact information: [place] [address] [phone]        [facility]. Call on 05/25/2019.   Why: for follow up  appointment Contact information: [place] [address] [phone]       Michel Harrow, [place]. Call on 05/25/2019.   Specialty: Physician Assistant Why: for post hospital follow up Contact information: [name] [address] [phone]           Signed: Bary Leriche 06/02/2019, 9:24 PM

## 2019-06-02 ENCOUNTER — Other Ambulatory Visit (HOSPITAL_COMMUNITY): Payer: Self-pay | Admitting: Interventional Radiology

## 2019-06-02 DIAGNOSIS — I771 Stricture of artery: Secondary | ICD-10-CM

## 2019-06-03 ENCOUNTER — Encounter: Payer: Self-pay | Admitting: Registered Nurse

## 2019-06-03 ENCOUNTER — Other Ambulatory Visit: Payer: Self-pay

## 2019-06-03 ENCOUNTER — Other Ambulatory Visit (HOSPITAL_COMMUNITY): Payer: Commercial Managed Care - PPO

## 2019-06-03 ENCOUNTER — Encounter: Payer: Commercial Managed Care - PPO | Attending: Registered Nurse | Admitting: Registered Nurse

## 2019-06-03 VITALS — BP 131/86 | HR 82 | Temp 97.9°F

## 2019-06-03 DIAGNOSIS — I6389 Other cerebral infarction: Secondary | ICD-10-CM | POA: Diagnosis present

## 2019-06-03 DIAGNOSIS — I10 Essential (primary) hypertension: Secondary | ICD-10-CM | POA: Diagnosis present

## 2019-06-03 DIAGNOSIS — E7849 Other hyperlipidemia: Secondary | ICD-10-CM | POA: Diagnosis present

## 2019-06-03 NOTE — Patient Instructions (Signed)
Call  [facility] to scheduled hospital follow up: Dr. Leonie Man seen you in the [facility]  [address], [place], [place] [address] [phone]

## 2019-06-03 NOTE — Progress Notes (Signed)
Subjective:    Patient ID: Jose Kaufman, male    DOB: [birth year], 68 y.o.   MRN: [id number]  HPI: Jose Kaufman is a 68 y.o. 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 He came to [facility] ED on 05/05/2019 with difficulty speaking. He was admitted with one week history of disorientation and confusion.  DG: Chest:  IMPRESSION: Bibasilar airspace opacities, atelectasis versus early infiltrates. CT Head WO Contrast IMPRESSION: No acute finding by CT. Old bilateral frontal and parietal cortical and subcortical infarctions. MR Brain WO Contrast: IMPRESSION: Old cortical and subcortical infarctions in both frontal and parietal regions. Areas of acute infarction in the left frontal and parietal brain without significant swelling or any acute hemorrhage. No antegrade flow is seen in either internal carotid artery and chronic occlusion is suspected. Posterior circulation does show patency. These acute infarctions could be embolic or watershed in [place]. CT Angio: IMPRESSION: Advanced atherosclerotic disease at the carotid bifurcation on the right with near occlusion the ICA. There is a patent string sign seen to the level of the skull base.  Advanced atherosclerotic disease at the carotid bifurcation on the left with occlusion the ICA. No string sign identified on this side.  50% stenosis at both vertebral artery origins. Long segment 50% stenosis affecting the proximal left subclavian artery.  IR Angio Intra ExtraCran SEL COM Carotid:  IMPRESSION: Angiographically occluded left internal carotid artery extra cranially with partial reconstitution from the external carotid artery branches as described above.  Severe high-grade stenosis of the right internal carotid artery proximally with delayed angiographic string sign being contiguous with the proximal  cavernous segment of the right internal carotid artery.  Severe high-grade pre occlusive stenosis of both vertebral artery origins.  On 05/12/2019 He underwent Cerebral Angiogram/ S/P Right VA Stent by Dr. Estanislado Pandy.   He's scheduled for stent placement with Dr. Kathi Ludwig on 06/08/2019.   Neurology was consulted, Dr. Leonie Man felt the watershed stroke was due to dehydration. He received IVF and statrted on DAPT.   He was admitted to Inpatient rehabilitation on 05/14/2019 and discharged home on 05/23/2019. He's going to Neuro Rehabilitation for Outpatient Therapy.  He denies any pain. He rates his pain 0. Also reports he has a good appetite.    Pain Inventory Average Pain 0 Pain Right Now 0 My pain is no pain  In the last 24 hours, has pain interfered with the following? General activity 0 Relation with others 0 Enjoyment of life 0 What TIME of day is your pain at its worst? no pain Sleep (in general) Good  Pain is worse with: no pain Pain improves with: no pain Relief from Meds: no pain  Mobility walk without assistance how many minutes can you walk? 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  no  Physicians involved in your care Any changes since last visit?  no Primary care Dr Sabra Heck   Family History  Problem Relation Age of Onset  . Leukemia Mother   . Pneumonia Father    Social History   Socioeconomic History  . Marital status: Divorced    Spouse name: Not on file  . Number of children: 2  . Years of education: 15  . Highest education level: Not on file  Occupational History  .  Occupation: [employer]  . Financial resource strain: Not on file  . Food insecurity    Worry: Not on file    Inability: Not on file  . Transportation needs    Medical: Not on file    Non-medical: Not on file   Tobacco Use  . Smoking status: Former Smoker    Types: Cigarettes    Quit date: 05/20/1992    Years since quitting: 27.0  . Smokeless tobacco: Never Used  Substance and Sexual Activity  . Alcohol use: Yes    Comment: Occasional  . Drug use: No  . Sexual activity: Yes  Lifestyle  . Physical activity    Days per week: Not on file    Minutes per session: Not on file  . Stress: Not on file  Relationships  . Social Herbalist on phone: Not on file    Gets together: Not on file    Attends religious service: Not on file    Active member of club or organization: Not on file    Attends meetings of clubs or organizations: Not on file    Relationship status: Not on file  Other Topics Concern  . Not on file  Social History Narrative   Pt lives at home, divorced   Right-handed   Caffeine: soda daily   Past Surgical History:  Procedure Laterality Date  . IR ANGIO INTRA EXTRACRAN SEL COM CAROTID INNOMINATE BILAT MOD SED  05/07/2019  . IR ANGIO VERTEBRAL SEL SUBCLAVIAN INNOMINATE BILAT MOD SED  05/07/2019  . IR TRANSCATH EXCRAN VERT OR CAR A STENT  05/12/2019  . IR US GUIDE VASC ACCESS RIGHT  05/12/2019  . RADIOLOGY WITH ANESTHESIA N/A 05/08/2019   Procedure: RADIOLOGY WITH ANESTHESIA;  Surgeon: [name], MD;  Location: [place];  Service: Radiology;  Laterality: N/A;  . RADIOLOGY WITH ANESTHESIA N/A 05/12/2019   Procedure: carotid stenting;  Surgeon: [name], MD;  Location: [place];  Service: Radiology;  Laterality: N/A;  . SKIN CANCER EXCISION    . TOTAL HIP ARTHROPLASTY Right 03/22/2017   Procedure: RIGHT TOTAL HIP ARTHROPLASTY ANTERIOR APPROACH;  Surgeon: Mcarthur Rossetti, MD;  Location: [facility];  Service: Orthopedics;  Laterality: Right;  . TOTAL HIP ARTHROPLASTY     Left 08/23/17 Dr. Zollie Beckers  . TOTAL HIP ARTHROPLASTY Left 08/23/2017   Procedure: LEFT TOTAL HIP ARTHROPLASTY ANTERIOR APPROACH;  Surgeon: Mcarthur Rossetti, MD;  Location: [facility];   Service: Orthopedics;  Laterality: Left;   Past Medical History:  Diagnosis Date  . Abnormality of gait 10/15/2016  . Arthritis   . Dysrhythmia    hx of  . Essential hypertension 03/14/2017  . PVCs (premature ventricular contractions) 03/14/2017  . Skin cancer    There were no vitals taken for this visit.  Opioid Risk Score:   Fall Risk Score:  `1  Depression screen PHQ 2/9  No flowsheet data found.   Review of Systems  Constitutional: Negative.   HENT: Negative.   Eyes: Negative.   Respiratory: Negative.   Cardiovascular: Negative.   Gastrointestinal: Negative.   Endocrine: Negative.   Genitourinary: Negative.   Musculoskeletal: Positive for gait problem.  Skin: Negative.   Allergic/Immunologic: Negative.   Hematological: Bruises/bleeds easily.       Brilinta  Psychiatric/Behavioral: Negative.   All other systems reviewed and are negative.      Objective:   Physical Exam Vitals signs and nursing note reviewed.  Constitutional:  Appearance: Normal appearance.  Neck:     Musculoskeletal: Normal range of motion and neck supple.  Cardiovascular:     Rate and Rhythm: Normal rate and regular rhythm.     Pulses: Normal pulses.     Heart sounds: Normal heart sounds.  Pulmonary:     Effort: Pulmonary effort is normal.     Breath sounds: Normal breath sounds.  Musculoskeletal:     Comments: Normal Muscle Bulk and Muscle Testing Reveals: Lower Extremities: Full ROM and Muscle Strength 5/5 Arises from Table with ease Narrow Based Gait   Skin:    General: Skin is warm and dry.  Neurological:     Mental Status: He is oriented to person, place, and time.  Psychiatric:        Mood and Affect: Mood normal.        Behavior: Behavior normal.           Assessment & Plan:  1. Acute bilateral cerebral infarction in a watershed distribution.: Continue Outpatient Rehabilitation.  Has a scheduled appointment with Neurology.  2. Essential Hypertension: Continue  current medication regimen. PCP Following.  3. Hyperlipidemia: Continue current medication regimen: PCP Following.   20 minutes of face to face patient care time was spent during this visit. All questions were encouraged and answered.  F/U with Dr. Letta Pate in 4-6 weeks.

## 2019-06-04 ENCOUNTER — Other Ambulatory Visit (HOSPITAL_COMMUNITY)
Admission: RE | Admit: 2019-06-04 | Discharge: 2019-06-04 | Disposition: A | Discharge status: Home / Self Care | Payer: Commercial Managed Care - PPO | Source: Ambulatory Visit | Attending: Interventional Radiology | Admitting: Interventional Radiology

## 2019-06-04 ENCOUNTER — Other Ambulatory Visit (HOSPITAL_COMMUNITY): Payer: Self-pay | Admitting: Radiology

## 2019-06-04 DIAGNOSIS — Z1159 Encounter for screening for other viral diseases: Secondary | ICD-10-CM | POA: Insufficient documentation

## 2019-06-04 LAB — SARS CORONAVIRUS 2 (TAT 6-24 HRS): SARS Coronavirus 2: NEGATIVE

## 2019-06-05 ENCOUNTER — Encounter (HOSPITAL_COMMUNITY): Payer: Self-pay | Admitting: *Deleted

## 2019-06-05 ENCOUNTER — Other Ambulatory Visit: Payer: Self-pay

## 2019-06-05 ENCOUNTER — Other Ambulatory Visit: Payer: Self-pay | Admitting: Student

## 2019-06-05 NOTE — Progress Notes (Signed)
Pt denies SOB, chest pain, and being under the care of a cardiologist. Pt denies having a stress test and cardiac cath. Pt made aware to stop taking vitamins, fish oil and herbal medications. Do not take any NSAIDs ie: Ibuprofen, Advil, Naproxen (Aleve), Motrin, BC and Goody Powder.  Pt denies that he and family members tested positive for COVID-19 ( pt tested negative on 06/04/19 and reminded to quarantine). Pt denies that he and family members experienced the following symptoms:  Cough yes/no: No Fever (>100.71F)  yes/no: No Runny nose yes/no: No Sore throat yes/no: No Difficulty breathing/shortness of breath  yes/no: No  Have you or a family member traveled in the last 14 days and where? yes/no: No  Pt reminded that hospital visitation restrictions are in effect and the importance of the restrictions.   Pt verbalized understanding of all pre-op instructions.

## 2019-06-07 NOTE — Anesthesia Preprocedure Evaluation (Addendum)
Anesthesia Evaluation  Patient identified by MRN, date of birth, ID band Patient awake    Reviewed: Allergy & Precautions, NPO status , Patient's Chart, lab work & pertinent test results  Airway Mallampati: II  TM Distance: >3 FB Neck ROM: Full    Dental  (+) Teeth Intact, Dental Advisory Given   Pulmonary neg pulmonary ROS, former smoker,    Pulmonary exam normal breath sounds clear to auscultation       Cardiovascular hypertension, Normal cardiovascular exam Rhythm:Regular Rate:Normal     Neuro/Psych CVA    GI/Hepatic negative GI ROS, Neg liver ROS,   Endo/Other  negative endocrine ROS  Renal/GU negative Renal ROS     Musculoskeletal negative musculoskeletal ROS (+)   Abdominal   Peds  Hematology  (+) Blood dyscrasia (Brilinta), ,   Anesthesia Other Findings Day of surgery medications reviewed with the patient.  Reproductive/Obstetrics                            Anesthesia Physical Anesthesia Plan  ASA: III  Anesthesia Plan: General   Post-op Pain Management:    Induction: Intravenous  PONV Risk Score and Plan: 2 and Ondansetron and Dexamethasone  Airway Management Planned: Oral ETT  Additional Equipment: Arterial line  Intra-op Plan:   Post-operative Plan: Extubation in OR  Informed Consent: I have reviewed the patients History and Physical, chart, labs and discussed the procedure including the risks, benefits and alternatives for the proposed anesthesia with the patient or authorized representative who has indicated his/her understanding and acceptance.     Dental advisory given  Plan Discussed with: CRNA  Anesthesia Plan Comments: (MAC vs GA depending on surgeon preference)       Anesthesia Quick Evaluation

## 2019-06-08 ENCOUNTER — Ambulatory Visit (HOSPITAL_COMMUNITY): Payer: Commercial Managed Care - PPO | Admitting: Anesthesiology

## 2019-06-08 ENCOUNTER — Other Ambulatory Visit: Payer: Self-pay

## 2019-06-08 ENCOUNTER — Ambulatory Visit (HOSPITAL_COMMUNITY)
Admission: RE | Admit: 2019-06-08 | Discharge: 2019-06-08 | Disposition: A | Discharge status: Home / Self Care | Payer: Commercial Managed Care - PPO | Attending: Interventional Radiology | Admitting: Interventional Radiology

## 2019-06-08 ENCOUNTER — Encounter (HOSPITAL_COMMUNITY)
Admission: RE | Disposition: A | Discharge status: Home / Self Care | Payer: Self-pay | Source: Home / Self Care | Attending: Interventional Radiology

## 2019-06-08 ENCOUNTER — Ambulatory Visit (HOSPITAL_COMMUNITY): Payer: Commercial Managed Care - PPO

## 2019-06-08 ENCOUNTER — Encounter (HOSPITAL_COMMUNITY): Payer: Self-pay | Admitting: *Deleted

## 2019-06-08 ENCOUNTER — Ambulatory Visit: Payer: Commercial Managed Care - PPO | Admitting: Physical Therapy

## 2019-06-08 DIAGNOSIS — Z7902 Long term (current) use of antithrombotics/antiplatelets: Secondary | ICD-10-CM | POA: Diagnosis not present

## 2019-06-08 DIAGNOSIS — Z5309 Procedure and treatment not carried out because of other contraindication: Secondary | ICD-10-CM | POA: Diagnosis not present

## 2019-06-08 DIAGNOSIS — Z7982 Long term (current) use of aspirin: Secondary | ICD-10-CM | POA: Diagnosis not present

## 2019-06-08 DIAGNOSIS — M1990 Unspecified osteoarthritis, unspecified site: Secondary | ICD-10-CM | POA: Diagnosis not present

## 2019-06-08 DIAGNOSIS — I10 Essential (primary) hypertension: Secondary | ICD-10-CM | POA: Diagnosis not present

## 2019-06-08 DIAGNOSIS — R319 Hematuria, unspecified: Secondary | ICD-10-CM | POA: Diagnosis not present

## 2019-06-08 DIAGNOSIS — Z880 Allergy status to penicillin: Secondary | ICD-10-CM | POA: Insufficient documentation

## 2019-06-08 DIAGNOSIS — Z09 Encounter for follow-up examination after completed treatment for conditions other than malignant neoplasm: Secondary | ICD-10-CM | POA: Insufficient documentation

## 2019-06-08 DIAGNOSIS — Z96643 Presence of artificial hip joint, bilateral: Secondary | ICD-10-CM | POA: Diagnosis not present

## 2019-06-08 DIAGNOSIS — I69320 Aphasia following cerebral infarction: Secondary | ICD-10-CM | POA: Insufficient documentation

## 2019-06-08 DIAGNOSIS — I771 Stricture of artery: Secondary | ICD-10-CM

## 2019-06-08 DIAGNOSIS — Z95828 Presence of other vascular implants and grafts: Secondary | ICD-10-CM | POA: Insufficient documentation

## 2019-06-08 DIAGNOSIS — Z79899 Other long term (current) drug therapy: Secondary | ICD-10-CM | POA: Diagnosis not present

## 2019-06-08 DIAGNOSIS — Z87891 Personal history of nicotine dependence: Secondary | ICD-10-CM | POA: Diagnosis not present

## 2019-06-08 HISTORY — PX: RADIOLOGY WITH ANESTHESIA: SHX6223

## 2019-06-08 HISTORY — DX: Cerebral infarction, unspecified: I63.9

## 2019-06-08 HISTORY — PX: IR PATIENT EVAL TECH 0-60 MINS: IMG5564

## 2019-06-08 HISTORY — DX: Presence of spectacles and contact lenses: Z97.3

## 2019-06-08 LAB — BASIC METABOLIC PANEL
Anion gap: 7 (ref 5–15)
BUN: 15 mg/dL (ref 8–23)
CO2: 21 mmol/L — ABNORMAL LOW (ref 22–32)
Calcium: 8.7 mg/dL — ABNORMAL LOW (ref 8.9–10.3)
Chloride: 112 mmol/L — ABNORMAL HIGH (ref 98–111)
Creatinine, Ser: 1.14 mg/dL (ref 0.61–1.24)
GFR calc Af Amer: >60 mL/min (ref >60)
GFR calc non Af Amer: >60 mL/min (ref >60)
Glucose, Bld: 103 mg/dL — ABNORMAL HIGH (ref 70–99)
Potassium: 3.5 mmol/L (ref 3.5–5.1)
Sodium: 140 mmol/L (ref 135–145)

## 2019-06-08 LAB — CBC WITH DIFFERENTIAL/PLATELET
Abs Immature Granulocytes: 0.03 10*3/uL (ref 0.00–0.07)
Basophils Absolute: 0 10*3/uL (ref 0.0–0.1)
Basophils Relative: 1 %
Eosinophils Absolute: 0.2 10*3/uL (ref 0.0–0.5)
Eosinophils Relative: 3 %
HCT: 42.2 % (ref 39.0–52.0)
Hemoglobin: 13.9 g/dL (ref 13.0–17.0)
Immature Granulocytes: 0 %
Lymphocytes Relative: 13 %
Lymphs Abs: 0.9 10*3/uL (ref 0.7–4.0)
MCH: 30.7 pg (ref 26.0–34.0)
MCHC: 32.9 g/dL (ref 30.0–36.0)
MCV: 93.2 fL (ref 80.0–100.0)
Monocytes Absolute: 0.5 10*3/uL (ref 0.1–1.0)
Monocytes Relative: 7 %
Neutro Abs: 5.3 10*3/uL (ref 1.7–7.7)
Neutrophils Relative %: 76 %
Platelets: 193 10*3/uL (ref 150–400)
RBC: 4.53 MIL/uL (ref 4.22–5.81)
RDW: 13.3 % (ref 11.5–15.5)
WBC: 7 10*3/uL (ref 4.0–10.5)
nRBC: 0 % (ref 0.0–0.2)

## 2019-06-08 LAB — PROTIME-INR
INR: 1.2 (ref 0.8–1.2)
Prothrombin Time: 14.6 seconds (ref 11.4–15.2)

## 2019-06-08 LAB — URINALYSIS, ROUTINE W REFLEX MICROSCOPIC
Bilirubin Urine: NEGATIVE
Glucose, UA: NEGATIVE mg/dL
Ketones, ur: NEGATIVE mg/dL
Leukocytes,Ua: NEGATIVE
Nitrite: NEGATIVE
Protein, ur: NEGATIVE mg/dL
Specific Gravity, Urine: 1.021 (ref 1.005–1.030)
pH: 5 (ref 5.0–8.0)

## 2019-06-08 LAB — APTT: aPTT: 35 seconds (ref 24–36)

## 2019-06-08 LAB — PLATELET INHIBITION P2Y12: Platelet Function  P2Y12: 58 [PRU] — ABNORMAL LOW (ref 182–335)

## 2019-06-08 SURGERY — IR WITH ANESTHESIA
Anesthesia: General

## 2019-06-08 MED ORDER — TICAGRELOR 90 MG PO TABS: 90.0000 mg | ORAL_TABLET | Freq: Once | ORAL | Status: DC

## 2019-06-08 MED ORDER — FENTANYL CITRATE (PF) 100 MCG/2ML IJ SOLN
INTRAMUSCULAR | Status: DC | PRN
  Administered 2019-06-08: 25 ug via INTRAVENOUS
  Administered 2019-06-08 (×2): 50 ug via INTRAVENOUS

## 2019-06-08 MED ORDER — ONDANSETRON HCL 4 MG/2ML IJ SOLN: 4.0000 mg | Freq: Once | INTRAMUSCULAR | Status: DC | PRN

## 2019-06-08 MED ORDER — FENTANYL CITRATE (PF) 100 MCG/2ML IJ SOLN: 25.0000 ug | INTRAMUSCULAR | Status: DC | PRN

## 2019-06-08 MED ORDER — MIDAZOLAM HCL 5 MG/5ML IJ SOLN
INTRAMUSCULAR | Status: DC | PRN
  Administered 2019-06-08 (×2): 1 mg via INTRAVENOUS

## 2019-06-08 MED ORDER — ASPIRIN EC 81 MG PO TBEC: 81.0000 mg | DELAYED_RELEASE_TABLET | Freq: Once | ORAL | Status: DC

## 2019-06-08 MED ORDER — VANCOMYCIN HCL 1000 MG IV SOLR: 1000.0000 mg | INTRAVENOUS | Status: DC

## 2019-06-08 MED ORDER — VANCOMYCIN HCL IN DEXTROSE 1-5 GM/200ML-% IV SOLN
1000.0000 mg | INTRAVENOUS | Status: DC
  Filled 2019-06-08: qty 200

## 2019-06-08 MED ORDER — SODIUM CHLORIDE 0.9 % IV SOLN: INTRAVENOUS | Status: DC

## 2019-06-08 MED ORDER — LACTATED RINGERS IV SOLN
INTRAVENOUS | Status: DC | PRN
  Administered 2019-06-08: 07:00:00 via INTRAVENOUS

## 2019-06-08 MED ORDER — VANCOMYCIN HCL 1000 MG IV SOLR
INTRAVENOUS | Status: DC | PRN
  Administered 2019-06-08: 1000 mg via INTRAVENOUS

## 2019-06-08 MED ORDER — NIMODIPINE 30 MG PO CAPS
0.0000 mg | ORAL_CAPSULE | ORAL | Status: AC
  Administered 2019-06-08: 30 mg via ORAL
  Filled 2019-06-08: qty 1

## 2019-06-08 NOTE — Sedation Documentation (Addendum)
Writer inserted a foley catheter at [place] present as a second Therapist, sports.  No urine return was noted & pt reported that he had emptied his bladder. Writer then inserted foley all the way in up to Y junction to assure that placement was in the bladder. No resistance was met. Pt reported some minor discomfort, but nothing very bad. Frank blood was noted. Writer irrigated the catheter wit approx 60cc NS. Blood is still present. Clots were noted in tubing near the Y junction, & writer was able to pull them into the bag. Urology has been consulted and per Dr Alinda Money, we will remove the 57f, insert an 172f note return & inform him.

## 2019-06-08 NOTE — Transfer of Care (Signed)
Immediate Anesthesia Transfer of Care Note  Patient: Jose Kaufman  Procedure(s) Performed: STENT PLACEMENT (N/A )  Patient Location: PACU  Anesthesia Type:MAC  Level of Consciousness: awake, alert  and oriented  Airway & Oxygen Therapy: Patient connected to nasal cannula oxygen  Post-op Assessment: Post -op Vital signs reviewed and stable  Post vital signs: stable  Last Vitals:  Vitals Value Taken Time  BP    Temp    Pulse    Resp    SpO2      Last Pain:  Vitals:   06/08/19 0634  TempSrc:   PainSc: 0-No pain      Patients Stated Pain Goal: 8 (99/83/38 2505)  Complications: No apparent anesthesia complications and Procedure cancelled due to complications placing foley

## 2019-06-08 NOTE — Anesthesia Procedure Notes (Signed)
Arterial Line Insertion Start/End6/29/2020 7:44 AM, 06/08/2019 7:46 AM Performed by: Lavell Luster, CRNA, CRNA  Preanesthetic checklist: patient identified, IV checked, site marked, risks and benefits discussed, surgical consent, monitors and equipment checked, pre-op evaluation and timeout performed Lidocaine 1% used for infiltration Right, radial was placed Catheter size: 20 G Hand hygiene performed , maximum sterile barriers used  and Seldinger technique used Allen's test indicative of satisfactory collateral circulation Attempts: 1 Procedure performed without using ultrasound guided technique. Ultrasound Notes:anatomy identified Following insertion, dressing applied and Biopatch. Post procedure assessment: normal  Patient tolerated the procedure well with no immediate complications.

## 2019-06-08 NOTE — Sedation Documentation (Signed)
Urology is coming to IR2 to consult. Per MD. Team aware

## 2019-06-08 NOTE — Sedation Documentation (Signed)
Pt under the care of Anesthesia 

## 2019-06-08 NOTE — Procedures (Signed)
Intervention aborted before starting due to blood return in foley after foley insertion.  Dr. Alinda Money came and consulted with patient.  [employer]

## 2019-06-08 NOTE — Sedation Documentation (Signed)
15fr insertion attempted with Roe Coombs RN Gar Ponto RN as second. RN could not advance the catheter & removed it. [name] calling Dr Alinda Money to report results. Writer will escort pt and CRNA to PACU.

## 2019-06-08 NOTE — Consult Note (Signed)
Urology Consult   Physician requesting consult: Dr. Estanislado Pandy  Reason for consult: Foley catheter trauma  History of Present Illness: Jose Kaufman is a 68 y.o.  who was scheduled to undergo percutaneous carotid artery stenting today.  He is on aspirin and Plavix chronically.  At the beginning of his procedure, an attempt was made to place a Foley catheter.  This returned frank blood.  I was then consulted by phone and recommended that his catheter be removed and replaced.  A new attempted catheter placement was unsuccessful.  He has no history of prior GU surgery.  He does not have known BPH.   He denies a history of hematuria.  He denies a history of voiding or storage urinary symptoms, hematuria, UTIs, STDs, urolithiasis, GU malignancy/trauma/surgery.  Past Medical History:  Diagnosis Date  . Abnormality of gait 10/15/2016  . Arthritis   . Dysrhythmia    hx of  . Essential hypertension 03/14/2017  . PVCs (premature ventricular contractions) 03/14/2017  . Skin cancer   . Stroke ([place])   . Wears glasses     Past Surgical History:  Procedure Laterality Date  . COLONOSCOPY    . IR ANGIO INTRA EXTRACRAN SEL COM CAROTID INNOMINATE BILAT MOD SED  05/07/2019  . IR ANGIO VERTEBRAL SEL SUBCLAVIAN INNOMINATE BILAT MOD SED  05/07/2019  . IR TRANSCATH EXCRAN VERT OR CAR A STENT  05/12/2019  . IR US GUIDE VASC ACCESS RIGHT  05/12/2019  . RADIOLOGY WITH ANESTHESIA N/A 05/08/2019   Procedure: RADIOLOGY WITH ANESTHESIA;  Surgeon: [name], MD;  Location: [place];  Service: Radiology;  Laterality: N/A;  . RADIOLOGY WITH ANESTHESIA N/A 05/12/2019   Procedure: carotid stenting;  Surgeon: [name], MD;  Location: [place];  Service: Radiology;  Laterality: N/A;  . SKIN CANCER EXCISION    . TOTAL HIP ARTHROPLASTY Right 03/22/2017   Procedure: RIGHT TOTAL HIP ARTHROPLASTY ANTERIOR APPROACH;  Surgeon: Mcarthur Rossetti, MD;  Location: [facility];  Service: Orthopedics;  Laterality: Right;  . TOTAL  HIP ARTHROPLASTY     Left 08/23/17 Dr. Zollie Beckers  . TOTAL HIP ARTHROPLASTY Left 08/23/2017   Procedure: LEFT TOTAL HIP ARTHROPLASTY ANTERIOR APPROACH;  Surgeon: Mcarthur Rossetti, MD;  Location: [facility];  Service: Orthopedics;  Laterality: Left;  . WISDOM TOOTH EXTRACTION      Medications:  Home meds:  No current facility-administered medications on file prior to encounter.    Current Outpatient Medications on File Prior to Encounter  Medication Sig Dispense Refill  . aspirin EC 81 MG tablet Take 81 mg by mouth daily.    Marland Kitchen atorvastatin (LIPITOR) 80 MG tablet Take 1 tablet (80 mg total) by mouth daily at 6 PM. 30 tablet 1  . ticagrelor (BRILINTA) 90 MG TABS tablet Take 1 tablet (90 mg total) by mouth 2 (two) times daily. 60 tablet 1  . acetaminophen (TYLENOL) 325 MG tablet Take 650 mg by mouth every 6 (six) hours as needed for mild pain.     . hydrocortisone cream 1 % Apply topically daily as needed for itching. (Patient not taking: Reported on 05/25/2019) 30 g 0  . selenium sulfide (SELSUN) 1 % LOTN Apply 1 application topically daily. (Patient not taking: Reported on 05/25/2019)    . thiamine (VITAMIN B-1) 100 MG tablet Take 100 mg by mouth 2 (two) times a week.       Scheduled Meds: . aspirin EC  81 mg Oral Once  . ticagrelor  90 mg Oral Once  Continuous Infusions: . sodium chloride    . vancomycin     PRN Meds:.fentaNYL (SUBLIMAZE) injection, ondansetron (ZOFRAN) IV  Allergies:  Allergies  Allergen Reactions  . Penicillins Other (See Comments)    Childhood reaction. Has patient had a PCN reaction causing immediate rash, facial/tongue/throat swelling, SOB or lightheadedness with hypotension: unsure Has patient had a PCN reaction causing severe rash involving mucus membranes or skin necrosis: unsure Has patient had a PCN reaction that required hospitalization:No Has patient had a PCN reaction occurring within the last 10 years:No If all of the above answers are  "NO", then may proceed with Cephalosporin use    Family History  Problem Relation Age of Onset  . Leukemia Mother   . Pneumonia Father     Social History:  reports that he quit smoking about 27 years ago. His smoking use included cigarettes. He has never used smokeless tobacco. He reports previous alcohol use. He reports that he does not use drugs.  ROS: A complete review of systems was performed.  All systems are negative except for pertinent findings as noted.  Physical Exam:  Vital signs in last 24 hours: Temp:  [97.6 F (36.4 C)-97.9 F (36.6 C)] 97.6 F (36.4 C) (06/29 1110) Pulse Rate:  [66-94] 66 (06/29 1110) Resp:  [14-17] 14 (06/29 1110) BP: (130)/(73-78) 130/73 (06/29 1110) SpO2:  [97 %-99 %] 99 % (06/29 1110) Arterial Line BP: (135)/(65) 135/65 (06/29 1110) Constitutional:  Alert and oriented, No acute distress Cardiovascular:  No JVD Respiratory: Normal respiratory effort Genitourinary: No CVAT. Normal male phallus, testes are descended bilaterally and non-tender and without masses, scrotum is normal in appearance without lesions or masses, perineum is normal on inspection.   There is blood at the urethral meatus. Neurologic: Grossly intact, no focal deficits Psychiatric: Normal mood and affect  Laboratory Data:  Recent Labs    06/08/19 0549  WBC 7.0  HGB 13.9  HCT 42.2  PLT 193    Recent Labs    06/08/19 0549  NA 140  K 3.5  CL 112*  GLUCOSE 103*  BUN 15  CALCIUM 8.7*  CREATININE 1.14     Results for orders placed or performed during the hospital encounter of 06/08/19 (from the past 24 hour(s))  APTT     Status: None   Collection Time: 06/08/19  5:49 AM  Result Value Ref Range   aPTT 35 24 - 36 seconds  Basic metabolic panel     Status: Abnormal   Collection Time: 06/08/19  5:49 AM  Result Value Ref Range   Sodium 140 135 - 145 mmol/L   Potassium 3.5 3.5 - 5.1 mmol/L   Chloride 112 (H) 98 - 111 mmol/L   CO2 21 (L) 22 - 32 mmol/L    Glucose, Bld 103 (H) 70 - 99 mg/dL   BUN 15 8 - 23 mg/dL   Creatinine, Ser 1.14 0.61 - 1.24 mg/dL   Calcium 8.7 (L) 8.9 - 10.3 mg/dL   GFR calc non Af Amer >60 >60 mL/min   GFR calc Af Amer >60 >60 mL/min   Anion gap 7 5 - 15  CBC WITH DIFFERENTIAL     Status: None   Collection Time: 06/08/19  5:49 AM  Result Value Ref Range   WBC 7.0 4.0 - 10.5 K/uL   RBC 4.53 4.22 - 5.81 MIL/uL   Hemoglobin 13.9 13.0 - 17.0 g/dL   HCT 42.2 39.0 - 52.0 %   MCV 93.2 80.0 - 100.0 fL  MCH 30.7 26.0 - 34.0 pg   MCHC 32.9 30.0 - 36.0 g/dL   RDW 13.3 11.5 - 15.5 %   Platelets 193 150 - 400 K/uL   nRBC 0.0 0.0 - 0.2 %   Neutrophils Relative % 76 %   Neutro Abs 5.3 1.7 - 7.7 K/uL   Lymphocytes Relative 13 %   Lymphs Abs 0.9 0.7 - 4.0 K/uL   Monocytes Relative 7 %   Monocytes Absolute 0.5 0.1 - 1.0 K/uL   Eosinophils Relative 3 %   Eosinophils Absolute 0.2 0.0 - 0.5 K/uL   Basophils Relative 1 %   Basophils Absolute 0.0 0.0 - 0.1 K/uL   Immature Granulocytes 0 %   Abs Immature Granulocytes 0.03 0.00 - 0.07 K/uL  Platelet inhibition p2y12 (not at [facility])     Status: Abnormal   Collection Time: 06/08/19  5:49 AM  Result Value Ref Range   Platelet Function  P2Y12 58 (L) 182 - 335 PRU  Protime-INR     Status: None   Collection Time: 06/08/19  5:49 AM  Result Value Ref Range   Prothrombin Time 14.6 11.4 - 15.2 seconds   INR 1.2 0.8 - 1.2  Urinalysis, Routine w reflex microscopic     Status: Abnormal   Collection Time: 06/08/19  7:27 AM  Result Value Ref Range   Color, Urine YELLOW YELLOW   APPearance CLEAR CLEAR   Specific Gravity, Urine 1.021 1.005 - 1.030   pH 5.0 5.0 - 8.0   Glucose, UA NEGATIVE NEGATIVE mg/dL   Hgb urine dipstick MODERATE (A) NEGATIVE   Bilirubin Urine NEGATIVE NEGATIVE   Ketones, ur NEGATIVE NEGATIVE mg/dL   Protein, ur NEGATIVE NEGATIVE mg/dL   Nitrite NEGATIVE NEGATIVE   Leukocytes,Ua NEGATIVE NEGATIVE   RBC / HPF 6-10 0 - 5 RBC/hpf   WBC, UA 0-5 0 - 5 WBC/hpf    Bacteria, UA RARE (A) NONE SEEN   Mucus PRESENT    Hyaline Casts, UA PRESENT    Recent Results (from the past 240 hour(s))  SARS Coronavirus 2 (Performed in [facility] hospital lab)     Status: None   Collection Time: 06/04/19 11:54 AM   Specimen: Nasal Swab  Result Value Ref Range Status   SARS Coronavirus 2 NEGATIVE NEGATIVE Final    Comment: (NOTE) SARS-CoV-2 target nucleic acids are NOT DETECTED. The SARS-CoV-2 RNA is generally detectable in upper and lower respiratory specimens during the acute phase of infection. Negative results do not preclude SARS-CoV-2 infection, do not rule out co-infections with other pathogens, and should not be used as the sole basis for treatment or other patient management decisions. Negative results must be combined with clinical observations, patient history, and epidemiological information. The expected result is Negative. Fact Sheet for Patients: SugarRoll.be Fact Sheet for Healthcare Providers: https://www.woods-mathews.com/ This test is not yet approved or cleared by the [place] FDA and  has been authorized for detection and/or diagnosis of SARS-CoV-2 by FDA under an Emergency Use Authorization (EUA). This EUA will remain  in effect (meaning this test can be used) for the duration of the COVID-19 declaration under Section 56 4(b)(1) of the Act, 21 U.S.C. section 360bbb-3(b)(1), unless the authorization is terminated or revoked sooner. Performed at [facility], [place] [address] [place], [place] [address]     Renal Function: Recent Labs    06/08/19 0549  CREATININE 1.14   CrCl cannot be calculated (Unknown ideal weight.).    Procedure:    His  genitalia was prepped in the usual sterile fashion.  I then made an attempt to place an 61 [place] coude catheter.  This was able to be placed into the bladder with minimal resistance and return of pink-tinged urine.  I then irrigated his  catheter with a few small clots removed followed by clear urine drainage.  Impression/Recommendation   Foley catheter trauma:  His procedure is to be postponed today in order to avoid additional anticoagulation therapy in light of his current bleeding.  His urethral bleeding should tamponade with an indwelling catheter.  A small gauze has been placed at his urethral meatus and he has been instructed to remove this in 2-3 hours.  He will follow-up as an outpatient later this week for a potential voiding trial.  I have communicated with his brother, Johnpaul Gillentine.  Dutch Gray 06/08/2019, 11:19 AM    Pryor Curia. MD  CC: Dr. Estanislado Pandy

## 2019-06-08 NOTE — Progress Notes (Signed)
Patient ID: Jose Kaufman, male   DOB: 05/19/1951, 68 y.o.   MRN: [id number] INR. Procedure postponed in view of hematuria following insertion of  A 16 Foley catheter. No clear urine obtained despite flushing  a  few times. On examination fresh bleeding around the catheter noted. Have requested Urology to see patient for recommendations. Have D/W patient. Carotid and Vertebral  revascularization  will therefore be postponed for now. S.Lovell Roe MD

## 2019-06-08 NOTE — H&P (Signed)
Chief Complaint: Patient was seen in consultation today for cerebral arteriogram with possible intervention at the request of Dr Royal Hawthorn   Supervising Physician: [name]  Patient Status: [facility] - Out-pt  History of Present Illness: Jose Kaufman is a 68 y.o. male   Known to [facility] R Vertebral artery stent assisted angioplasty 05/12/2019 IMPRESSION: Status post endovascular revascularization of high-grade pre occlusive right vertebral artery origin with stent assisted angioplasty as described. PLAN: Return for catheter arteriogram for follow-up and possible intervention to revascularize the left vertebral artery origin or the right internal carotid artery proximally in 4-6 weeks time.  Now scheduled for R Internal carotid artery and/or Left vertebral artery intervention  Hx CVA 05/05/19 Pt still with mild aphasia; disorientation resolved Gait much improved R VA angioplasty/stent Now scheduled for cerebral  arteriogram with possible L VA and/or R ICA angioplasty stent placement   Past Medical History:  Diagnosis Date  . Abnormality of gait 10/15/2016  . Arthritis   . Dysrhythmia    hx of  . Essential hypertension 03/14/2017  . PVCs (premature ventricular contractions) 03/14/2017  . Skin cancer   . Stroke ([place])   . Wears glasses     Past Surgical History:  Procedure Laterality Date  . COLONOSCOPY    . IR ANGIO INTRA EXTRACRAN SEL COM CAROTID INNOMINATE BILAT MOD SED  05/07/2019  . IR ANGIO VERTEBRAL SEL SUBCLAVIAN INNOMINATE BILAT MOD SED  05/07/2019  . IR TRANSCATH EXCRAN VERT OR CAR A STENT  05/12/2019  . IR US GUIDE VASC ACCESS RIGHT  05/12/2019  . RADIOLOGY WITH ANESTHESIA N/A 05/08/2019   Procedure: RADIOLOGY WITH ANESTHESIA;  Surgeon: [name], MD;  Location: [place];  Service: Radiology;  Laterality: N/A;  . RADIOLOGY WITH ANESTHESIA N/A 05/12/2019   Procedure: carotid stenting;  Surgeon: [name], MD;  Location: [place];  Service: Radiology;   Laterality: N/A;  . SKIN CANCER EXCISION    . TOTAL HIP ARTHROPLASTY Right 03/22/2017   Procedure: RIGHT TOTAL HIP ARTHROPLASTY ANTERIOR APPROACH;  Surgeon: Mcarthur Rossetti, MD;  Location: [facility];  Service: Orthopedics;  Laterality: Right;  . TOTAL HIP ARTHROPLASTY     Left 08/23/17 Dr. Zollie Beckers  . TOTAL HIP ARTHROPLASTY Left 08/23/2017   Procedure: LEFT TOTAL HIP ARTHROPLASTY ANTERIOR APPROACH;  Surgeon: Mcarthur Rossetti, MD;  Location: [facility];  Service: Orthopedics;  Laterality: Left;  . WISDOM TOOTH EXTRACTION      Allergies: Penicillins  Medications: Prior to Admission medications   Medication Sig Start Date End Date Taking? Authorizing Provider  aspirin EC 81 MG tablet Take 81 mg by mouth daily.   Yes [provider]  atorvastatin (LIPITOR) 80 MG tablet Take 1 tablet (80 mg total) by mouth daily at 6 PM. 05/22/19  Yes Love, Ivan Anchors, PA-C  ticagrelor (BRILINTA) 90 MG TABS tablet Take 1 tablet (90 mg total) by mouth 2 (two) times daily. 05/22/19  Yes Love, Ivan Anchors, PA-C  acetaminophen (TYLENOL) 325 MG tablet Take 650 mg by mouth every 6 (six) hours as needed for mild pain.     [provider]  hydrocortisone cream 1 % Apply topically daily as needed for itching. Patient not taking: Reported on 05/25/2019 05/22/19   Love, Ivan Anchors, PA-C  selenium sulfide (SELSUN) 1 % LOTN Apply 1 application topically daily. Patient not taking: Reported on 05/25/2019 05/22/19   Love, Ivan Anchors, PA-C  thiamine (VITAMIN B-1) 100 MG tablet Take 100 mg by mouth 2 (two) times  a week.    [provider]     Family History  Problem Relation Age of Onset  . Leukemia Mother   . Pneumonia Father     Social History   Socioeconomic History  . Marital status: Divorced    Spouse name: Not on file  . Number of children: 2  . Years of education: 76  . Highest education level: Not on file  Occupational History  . Occupation: [employer]  . Financial  resource strain: Not on file  . Food insecurity    Worry: Not on file    Inability: Not on file  . Transportation needs    Medical: Not on file    Non-medical: Not on file  Tobacco Use  . Smoking status: Former Smoker    Types: Cigarettes    Quit date: 05/20/1992    Years since quitting: 27.0  . Smokeless tobacco: Never Used  Substance and Sexual Activity  . Alcohol use: Not Currently  . Drug use: No  . Sexual activity: Yes  Lifestyle  . Physical activity    Days per week: Not on file    Minutes per session: Not on file  . Stress: Not on file  Relationships  . Social Herbalist on phone: Not on file    Gets together: Not on file    Attends religious service: Not on file    Active member of club or organization: Not on file    Attends meetings of clubs or organizations: Not on file    Relationship status: Not on file  Other Topics Concern  . Not on file  Social History Narrative   Pt lives at home, divorced   Right-handed   Caffeine: soda daily     Review of Systems: A 12 point ROS discussed and pertinent positives are indicated in the HPI above.  All other systems are negative.  Review of Systems  Constitutional: Positive for fatigue. Negative for activity change, appetite change, fever and unexpected weight change.  HENT: Negative for tinnitus and trouble swallowing.   Eyes: Negative for visual disturbance.  Respiratory: Negative for cough and shortness of breath.   Cardiovascular: Negative for chest pain.  Gastrointestinal: Negative for abdominal pain.  Musculoskeletal: Negative for gait problem.  Neurological: Positive for speech difficulty. Negative for dizziness, tremors, seizures, syncope, facial asymmetry, weakness, light-headedness, numbness and headaches.  Psychiatric/Behavioral: Negative for behavioral problems, confusion and decreased concentration.    Vital Signs: BP 130/78   Pulse 94   Temp 97.9 F (36.6 C) (Oral)   Resp 17   SpO2 97%    Physical Exam Vitals signs reviewed.  Constitutional:      Appearance: Normal appearance.  HENT:     Head: Atraumatic.     Comments: Face symmetrical Tongue midline    Mouth/Throat:     Mouth: Mucous membranes are moist.  Eyes:     Extraocular Movements: Extraocular movements intact.  Cardiovascular:     Rate and Rhythm: Normal rate and regular rhythm.     Heart sounds: Normal heart sounds.  Pulmonary:     Effort: Pulmonary effort is normal.     Breath sounds: Normal breath sounds.  Abdominal:     Palpations: Abdomen is soft.     Tenderness: There is no abdominal tenderness.  Musculoskeletal: Normal range of motion.     Right lower leg: No edema.     Left lower leg: No edema.  Skin:  General: Skin is warm and dry.  Neurological:     Mental Status: He is alert and oriented to person, place, and time.  Psychiatric:        Mood and Affect: Mood normal.        Behavior: Behavior normal.        Thought Content: Thought content normal.        Judgment: Judgment normal.     Imaging: Ir Danielle Dess Or Car A Stent  Result Date: 05/15/2019 CLINICAL DATA:  Severe extracranial cerebrovascular disease. Bilaterally severely stenotic vertebral artery origins and occlusion of the left internal carotid artery. EXAM: IR ULTRASOUND GUIDANCE VASC ACCESS RIGHT; IR TRANSCATH EXCRAN VERT OR CAR A STENT COMPARISON:  Diagnostic catheter arteriogram of 05/07/2019. MEDICATIONS: Heparin 3,500 units IV and IA. Antibiotic was administered within 1 hour of the procedure. ANESTHESIA/SEDATION: Mac anesthesia as per the Department of Anesthesiology at [place]:  Isovue 300 approximately 60 cc. FLUOROSCOPY TIME:  Fluoroscopy Time: 23 minutes 6 seconds (845 mGy). COMPLICATIONS: None immediate. TECHNIQUE: Informed written consent was obtained from the patient after a thorough discussion of the procedural risks, benefits and alternatives. All questions were addressed. Maximal  Sterile Barrier Technique was utilized including caps, mask, sterile gowns, sterile gloves, sterile drape, hand hygiene and skin antiseptic. A timeout was performed prior to the initiation of the procedure. The right forearm to the right radius was prepped and draped in the usual sterile fashion. The radial artery was then identified using ultrasound with images obtained to document the right radial artery. Dorsal palmar anastomosis was then verified. Ultrasound guidance was then utilized to access the right radial artery with a micropuncture set. This was then exchanged over a 0.021 inch micro guidewire for a 5/6 French radial sheath. The obturator and wire were then removed. Good aspiration was obtained from the side port of the radial sheath. A cocktail of 2.5 mg of verapamil, 2000 units of heparin, and 200 mcg of nitroglycerin were then injected in a diluted form without event. Right radial artery angiogram was then obtained. Using roadmap technique, and over a 0.035 inch Roadrunner guidewire, a 6 [place] Envoy guide catheter was then advanced and positioned just proximal to the origin of the right vertebral artery. The guidewire was removed. Good aspiration was obtained from the hub of the Envoy 6 [place] guide catheter. An arteriogram performed again demonstrated severe high-grade stenosis of the right vertebral artery. More distally, the vessel was seen to opacify to the cranial skull base with patency of the right posterior-inferior cerebellar artery and retrograde opacification via the left posterior communicating artery of the left MCA distribution. Again demonstrated were the extensive pial collaterals arising from the right posterior cerebral artery P3 P4 segments augmenting flow into the right parieto-occipital and temporal regions. Using biplane roadmap technique and constant fluoroscopic guidance an 021 Trevo ProVue microcatheter was then advanced over a 0.014 inch Softip Synchro micro guidewire to the  distal end of the right vertebral artery. The micro guidewire was then removed. Free aspiration of blood was obtained at the hub of the microcatheter which was then exchanged for a 300 cm 014 inch Transend EX exchange micro guidewire. The micro guidewire had a J configuration to avoid dissections or inducing spasm. Measurements were then performed of the right vertebral artery just distal to the high-grade stenosis in the length of the atherosclerotic lesion. A Multi-Link Vision 4 mm x 15 mm stent was then retrogradely purged with heparinized saline infusion. Using  the rapid exchange technique, the balloon mounted stent was then advanced over the exchange micro guidewire and positioned across the high-grade stenosis. After having the patient hold his breath, and using a micro inflation syringe device via micro tubing, the stent was then deployed by inflating the balloon. It was maintained for approximately 15 seconds. The balloon was then deflated and retrieved and removed. A control arteriogram performed through the Envoy 6 [place] guide catheter in the right subclavian artery demonstrated significantly improved flow through the stented and angioplastied segment. Excellent flow was seen more distally into the intracranial compartment. The medial portion of the stent, however, was seen above the medial aspect of the stenosis whilst the lateral aspect of the proximal stent was seen just inside the right subclavian artery. In order to prevent restenosis it was decided to place a second stent device. A 4 mm x 8 mm Multi-Link Vision stent was then advanced in its delivery microcatheter and positioned again this time with the proximal marker just proximal to the orifice of the right vertebral artery. Again with the patient having to hold his breath, this was again deployed using micro inflation syringe device via micro tubing. The balloon was then deflated and retrieved and removed. The exchange micro guidewire was  maintained in the distal right vertebral artery. A control arteriogram performed through the Envoy 6 [place] guide catheter demonstrated excellent and improved apposition of the proximal stent with the coverage of the previously uncovered medial aspect of the orifice. Control arteriograms were then performed at 15 and 30 minutes post placement of the second stent. These continued to demonstrate excellent flow through the stented segment and also intracranially. Improved hemodynamic flow was noted into the left middle cerebral artery from the left PCOM. Also demonstrated was excellent flow in the pial collaterals from the right P3 P4 segments. The exchange micro guidewire was then retrieved and removed. A final control arteriogram performed through the 6 [place] Envoy guide catheter continued to demonstrate excellent flow through the stented segment in the proximal right vertebral artery. Interesting of note also noted was suggestion of improved caliber of the right internal carotid artery at the cranial skull base. The 6 [place] Envoy guide catheter was retrieved and removed. A right radial band was then applied in the usual manner for hemostasis in the right radial artery. The distal right radial pulse was verified following the placement of the wrist band. Throughout the procedure, the patient's neurological and hemodynamic status remained stable. At the end of the procedure, the patient continued to have mild word-finding difficulties unchanged from prior to the procedure. He was then transferred to the neuro ICU to continue low-dose IV heparin and close neurologic monitoring. Overnight the patient had no complications. The following morning, patient appeared neurologically unchanged. His fine motor movements in both fingers were symmetrical. He had no vision problems. Right radial pulse remained patent with the puncture site being stable. No evidence of hematoma was seen. The patient was then returned to the  admittance service. Patient was asked to continue taking his aspirin and his Brilinta 90 mg b.i.d. He was strongly advised to maintain adequate hydration by drinking water. A follow-up arteriogram is planned in approximately 4-6 weeks in order to evaluate probable further interventions to revascularize the origin of the left vertebral artery and/or the right internal carotid artery proximally. Patient was advised to call 911 should he have any symptoms of worsening speech, facial droop, motor weakness, vision problems. Patient expressed understanding. FINDINGS: See above. IMPRESSION:  Status post endovascular revascularization of high-grade pre occlusive right vertebral artery origin with stent assisted angioplasty as described. PLAN: Return for catheter arteriogram for follow-up and possible intervention to revascularize the left vertebral artery origin or the right internal carotid artery proximally in 4-6 weeks time. Electronically Signed   By: [name] M.D.   On: 05/14/2019 09:06   Ir US Guide Vasc Access Right  Result Date: 05/15/2019 CLINICAL DATA:  Severe extracranial cerebrovascular disease. Bilaterally severely stenotic vertebral artery origins and occlusion of the left internal carotid artery. EXAM: IR ULTRASOUND GUIDANCE VASC ACCESS RIGHT; IR TRANSCATH EXCRAN VERT OR CAR A STENT COMPARISON:  Diagnostic catheter arteriogram of 05/07/2019. MEDICATIONS: Heparin 3,500 units IV and IA. Antibiotic was administered within 1 hour of the procedure. ANESTHESIA/SEDATION: Mac anesthesia as per the Department of Anesthesiology at [place]:  Isovue 300 approximately 60 cc. FLUOROSCOPY TIME:  Fluoroscopy Time: 23 minutes 6 seconds (845 mGy). COMPLICATIONS: None immediate. TECHNIQUE: Informed written consent was obtained from the patient after a thorough discussion of the procedural risks, benefits and alternatives. All questions were addressed. Maximal Sterile Barrier Technique was  utilized including caps, mask, sterile gowns, sterile gloves, sterile drape, hand hygiene and skin antiseptic. A timeout was performed prior to the initiation of the procedure. The right forearm to the right radius was prepped and draped in the usual sterile fashion. The radial artery was then identified using ultrasound with images obtained to document the right radial artery. Dorsal palmar anastomosis was then verified. Ultrasound guidance was then utilized to access the right radial artery with a micropuncture set. This was then exchanged over a 0.021 inch micro guidewire for a 5/6 French radial sheath. The obturator and wire were then removed. Good aspiration was obtained from the side port of the radial sheath. A cocktail of 2.5 mg of verapamil, 2000 units of heparin, and 200 mcg of nitroglycerin were then injected in a diluted form without event. Right radial artery angiogram was then obtained. Using roadmap technique, and over a 0.035 inch Roadrunner guidewire, a 6 [place] Envoy guide catheter was then advanced and positioned just proximal to the origin of the right vertebral artery. The guidewire was removed. Good aspiration was obtained from the hub of the Envoy 6 [place] guide catheter. An arteriogram performed again demonstrated severe high-grade stenosis of the right vertebral artery. More distally, the vessel was seen to opacify to the cranial skull base with patency of the right posterior-inferior cerebellar artery and retrograde opacification via the left posterior communicating artery of the left MCA distribution. Again demonstrated were the extensive pial collaterals arising from the right posterior cerebral artery P3 P4 segments augmenting flow into the right parieto-occipital and temporal regions. Using biplane roadmap technique and constant fluoroscopic guidance an 021 Trevo ProVue microcatheter was then advanced over a 0.014 inch Softip Synchro micro guidewire to the distal end of the right  vertebral artery. The micro guidewire was then removed. Free aspiration of blood was obtained at the hub of the microcatheter which was then exchanged for a 300 cm 014 inch Transend EX exchange micro guidewire. The micro guidewire had a J configuration to avoid dissections or inducing spasm. Measurements were then performed of the right vertebral artery just distal to the high-grade stenosis in the length of the atherosclerotic lesion. A Multi-Link Vision 4 mm x 15 mm stent was then retrogradely purged with heparinized saline infusion. Using the rapid exchange technique, the balloon mounted stent was then advanced over the exchange micro  guidewire and positioned across the high-grade stenosis. After having the patient hold his breath, and using a micro inflation syringe device via micro tubing, the stent was then deployed by inflating the balloon. It was maintained for approximately 15 seconds. The balloon was then deflated and retrieved and removed. A control arteriogram performed through the Envoy 6 [place] guide catheter in the right subclavian artery demonstrated significantly improved flow through the stented and angioplastied segment. Excellent flow was seen more distally into the intracranial compartment. The medial portion of the stent, however, was seen above the medial aspect of the stenosis whilst the lateral aspect of the proximal stent was seen just inside the right subclavian artery. In order to prevent restenosis it was decided to place a second stent device. A 4 mm x 8 mm Multi-Link Vision stent was then advanced in its delivery microcatheter and positioned again this time with the proximal marker just proximal to the orifice of the right vertebral artery. Again with the patient having to hold his breath, this was again deployed using micro inflation syringe device via micro tubing. The balloon was then deflated and retrieved and removed. The exchange micro guidewire was maintained in the distal right  vertebral artery. A control arteriogram performed through the Envoy 6 [place] guide catheter demonstrated excellent and improved apposition of the proximal stent with the coverage of the previously uncovered medial aspect of the orifice. Control arteriograms were then performed at 15 and 30 minutes post placement of the second stent. These continued to demonstrate excellent flow through the stented segment and also intracranially. Improved hemodynamic flow was noted into the left middle cerebral artery from the left PCOM. Also demonstrated was excellent flow in the pial collaterals from the right P3 P4 segments. The exchange micro guidewire was then retrieved and removed. A final control arteriogram performed through the 6 [place] Envoy guide catheter continued to demonstrate excellent flow through the stented segment in the proximal right vertebral artery. Interesting of note also noted was suggestion of improved caliber of the right internal carotid artery at the cranial skull base. The 6 [place] Envoy guide catheter was retrieved and removed. A right radial band was then applied in the usual manner for hemostasis in the right radial artery. The distal right radial pulse was verified following the placement of the wrist band. Throughout the procedure, the patient's neurological and hemodynamic status remained stable. At the end of the procedure, the patient continued to have mild word-finding difficulties unchanged from prior to the procedure. He was then transferred to the neuro ICU to continue low-dose IV heparin and close neurologic monitoring. Overnight the patient had no complications. The following morning, patient appeared neurologically unchanged. His fine motor movements in both fingers were symmetrical. He had no vision problems. Right radial pulse remained patent with the puncture site being stable. No evidence of hematoma was seen. The patient was then returned to the admittance service. Patient was asked  to continue taking his aspirin and his Brilinta 90 mg b.i.d. He was strongly advised to maintain adequate hydration by drinking water. A follow-up arteriogram is planned in approximately 4-6 weeks in order to evaluate probable further interventions to revascularize the origin of the left vertebral artery and/or the right internal carotid artery proximally. Patient was advised to call 911 should he have any symptoms of worsening speech, facial droop, motor weakness, vision problems. Patient expressed understanding. FINDINGS: See above. IMPRESSION: Status post endovascular revascularization of high-grade pre occlusive right vertebral artery origin with stent assisted  angioplasty as described. PLAN: Return for catheter arteriogram for follow-up and possible intervention to revascularize the left vertebral artery origin or the right internal carotid artery proximally in 4-6 weeks time. Electronically Signed   By: [name] M.D.   On: 05/14/2019 09:06    Labs:  CBC: Recent Labs    05/13/19 0640 05/14/19 1501 05/15/19 0640 06/08/19 0549  WBC 9.5 8.4 8.9 7.0  HGB 13.3 13.8 14.5 13.9  HCT 39.3 40.2 42.4 42.2  PLT 215 218 225 193    COAGS: Recent Labs    05/05/19 0825  INR 1.1  APTT 34    BMP: Recent Labs    05/07/19 0635 05/08/19 0459 05/13/19 0640 05/14/19 1501  NA 140 140 138 139  K 4.4 4.0 3.7 3.5  CL 105 109 111 107  CO2 27 23 18* 24  GLUCOSE 98 107* 91 97  BUN 25* _0 CALCIUM 9.1 8.7* 8.3* 8.9  CREATININE 1.35* 1.28* 1.12 1.40*  GFRNONAA 55* 58* >60 52*  GFRAA >60 >60 >60 >60    LIVER FUNCTION TESTS: Recent Labs    05/05/19 0825 05/14/19 1501  BILITOT 0.8 0.8  AST 19 37  ALT 20 44  ALKPHOS 74 79  PROT 8.0 6.0*  ALBUMIN 4.7 3.4*    TUMOR MARKERS: No results for input(s): AFPTM, CEA, CA199, CHROMGRNA in the last 8760 hours.  Assessment and Plan:  CVA Previous angioplasty stent R VA in IR 05/14/2019 Now for cerebral arteriogram with possible R  ICA and /or L VA angioplasty/stent placement Risks and benefits of cerebral angiogram with intervention were discussed with the patient including, but not limited to bleeding, infection, vascular injury, contrast induced renal failure, stroke or even death.  This interventional procedure involves the use of X-rays and because of the nature of the planned procedure, it is possible that we will have prolonged use of X-ray fluoroscopy.  Potential radiation risks to you include (but are not limited to) the following: - A slightly elevated risk for cancer  several years later in life. This risk is typically less than 0.5% percent. This risk is low in comparison to the normal incidence of human cancer, which is 33% for women and 50% for men according to the [employer]. - Radiation induced injury can include skin redness, resembling a rash, tissue breakdown / ulcers and hair loss (which can be temporary or permanent).   The likelihood of either of these occurring depends on the difficulty of the procedure and whether you are sensitive to radiation due to previous procedures, disease, or genetic conditions.   IF your procedure requires a prolonged use of radiation, you will be notified and given written instructions for further action.  It is your responsibility to monitor the irradiated area for the 2 weeks following the procedure and to notify your physician if you are concerned that you have suffered a radiation induced injury.    All of the patient's questions were answered, patient is agreeable to proceed. Consent signed and in chart. Pt is aware if intervention is performed, he will be admitted overnight for observation and plan for DC in am if stable Agreeable  Thank you for this interesting consult.  I greatly enjoyed meeting [facility] and look forward to participating in their care.  A copy of this report was sent to the requesting provider on this date.  Electronically  Signed: Lavonia Drafts, PA-C 06/08/2019, 7:11 AM   I spent a total of  39 Minutes in face to face in clinical consultation, greater than 50% of which was counseling/coordinating care for cerebral arteriogram with possible R ICA/L VA angioplasty/stent

## 2019-06-08 NOTE — Anesthesia Postprocedure Evaluation (Signed)
Anesthesia Post Note  Patient: Wayne Sever  Procedure(s) Performed: STENT PLACEMENT (N/A )     Patient location during evaluation: PACU Anesthesia Type: MAC Level of consciousness: awake and alert Pain management: pain level controlled Vital Signs Assessment: post-procedure vital signs reviewed and stable Respiratory status: spontaneous breathing, nonlabored ventilation, respiratory function stable and patient connected to nasal cannula oxygen Cardiovascular status: stable and blood pressure returned to baseline Postop Assessment: no apparent nausea or vomiting Anesthetic complications: no Comments: Procedure aborted due to traumatic foley catheter placement.    Last Vitals:  Vitals:   06/08/19 1110 06/08/19 1125  BP: 130/73 119/85  Pulse: 66 66  Resp: 14 20  Temp: 36.4 C 36.6 C  SpO2: 99% 97%    Last Pain:  Vitals:   06/08/19 1125  TempSrc:   PainSc: 0-No pain                 Catalina Gravel

## 2019-06-09 ENCOUNTER — Ambulatory Visit: Payer: Commercial Managed Care - PPO | Admitting: Physical Therapy

## 2019-06-09 ENCOUNTER — Ambulatory Visit: Payer: Commercial Managed Care - PPO | Admitting: Speech Pathology

## 2019-06-09 ENCOUNTER — Ambulatory Visit: Payer: Commercial Managed Care - PPO | Admitting: Occupational Therapy

## 2019-06-09 ENCOUNTER — Encounter (HOSPITAL_COMMUNITY): Payer: Self-pay | Admitting: Interventional Radiology

## 2019-06-11 ENCOUNTER — Ambulatory Visit: Payer: Commercial Managed Care - PPO

## 2019-06-11 ENCOUNTER — Encounter: Payer: Commercial Managed Care - PPO | Admitting: Speech Pathology

## 2019-06-16 ENCOUNTER — Ambulatory Visit: Payer: Commercial Managed Care - PPO | Admitting: Physical Therapy

## 2019-06-16 ENCOUNTER — Ambulatory Visit: Payer: Commercial Managed Care - PPO | Admitting: Occupational Therapy

## 2019-06-16 ENCOUNTER — Ambulatory Visit: Payer: Commercial Managed Care - PPO | Admitting: Speech Pathology

## 2019-06-17 ENCOUNTER — Other Ambulatory Visit (HOSPITAL_COMMUNITY): Payer: Self-pay | Admitting: Interventional Radiology

## 2019-06-17 DIAGNOSIS — I771 Stricture of artery: Secondary | ICD-10-CM

## 2019-06-18 ENCOUNTER — Ambulatory Visit: Payer: Commercial Managed Care - PPO | Attending: Physical Medicine & Rehabilitation | Admitting: Speech Pathology

## 2019-06-18 ENCOUNTER — Ambulatory Visit: Payer: Commercial Managed Care - PPO

## 2019-06-18 ENCOUNTER — Other Ambulatory Visit: Payer: Self-pay

## 2019-06-18 ENCOUNTER — Ambulatory Visit: Payer: Commercial Managed Care - PPO | Admitting: Occupational Therapy

## 2019-06-18 DIAGNOSIS — R4701 Aphasia: Secondary | ICD-10-CM | POA: Insufficient documentation

## 2019-06-18 DIAGNOSIS — R41842 Visuospatial deficit: Secondary | ICD-10-CM | POA: Insufficient documentation

## 2019-06-18 DIAGNOSIS — R278 Other lack of coordination: Secondary | ICD-10-CM | POA: Diagnosis present

## 2019-06-18 DIAGNOSIS — I69318 Other symptoms and signs involving cognitive functions following cerebral infarction: Secondary | ICD-10-CM | POA: Diagnosis present

## 2019-06-18 DIAGNOSIS — R41840 Attention and concentration deficit: Secondary | ICD-10-CM | POA: Insufficient documentation

## 2019-06-18 DIAGNOSIS — R41841 Cognitive communication deficit: Secondary | ICD-10-CM

## 2019-06-18 DIAGNOSIS — R2681 Unsteadiness on feet: Secondary | ICD-10-CM | POA: Insufficient documentation

## 2019-06-22 ENCOUNTER — Telehealth: Payer: Self-pay | Admitting: Neurology

## 2019-06-22 NOTE — Telephone Encounter (Signed)
Pt called wanting a referral for his PT. Pt states he is already getting the occupational and speech therapy but they informed him that he needs a note for the PT. Please advise.

## 2019-06-22 NOTE — Therapy (Signed)
[facility] [address] [place], [name], [address] Phone: [phone]   Fax:  [phone]  Speech Language Pathology Treatment  Patient Details  Name: Jose Kaufman MRN: [id number] Date of Birth: [birth year] Referring Provider (SLP): Dr.Kirsteins   Encounter Date: 06/18/2019  End of Session - 06/22/19 1811    Visit Number  2    Number of Visits  17    Date for SLP Re-Evaluation  07/24/19    Authorization Type  [facility], 30 visits for OT/PT, 30 separate visits for ST    Authorization - Visit Number  2    Authorization - Number of Visits  30    SLP Start Time  1600    SLP Stop Time   1645    SLP Time Calculation (min)  45 min    Activity Tolerance  Patient tolerated treatment well       Past Medical History:  Diagnosis Date  . Abnormality of gait 10/15/2016  . Arthritis   . Dysrhythmia    hx of  . Essential hypertension 03/14/2017  . PVCs (premature ventricular contractions) 03/14/2017  . Skin cancer   . Stroke ([place])   . Wears glasses     Past Surgical History:  Procedure Laterality Date  . COLONOSCOPY    . IR ANGIO INTRA EXTRACRAN SEL COM CAROTID INNOMINATE BILAT MOD SED  05/07/2019  . IR ANGIO VERTEBRAL SEL SUBCLAVIAN INNOMINATE BILAT MOD SED  05/07/2019  . IR PATIENT EVAL TECH 0-60 MINS  06/08/2019  . IR TRANSCATH EXCRAN VERT OR CAR A STENT  05/12/2019  . IR US GUIDE VASC ACCESS RIGHT  05/12/2019  . RADIOLOGY WITH ANESTHESIA N/A 05/08/2019   Procedure: RADIOLOGY WITH ANESTHESIA;  Surgeon: [name], MD;  Location: [facility];  Service: Radiology;  Laterality: N/A;  . RADIOLOGY WITH ANESTHESIA N/A 05/12/2019   Procedure: carotid stenting;  Surgeon: [name], MD;  Location: [facility];  Service: Radiology;  Laterality: N/A;  . RADIOLOGY WITH ANESTHESIA N/A 06/08/2019   Procedure: STENT PLACEMENT;  Surgeon: [name], MD;  Location: [facility];  Service: Radiology;  Laterality: N/A;  . SKIN CANCER EXCISION    .  TOTAL HIP ARTHROPLASTY Right 03/22/2017   Procedure: RIGHT TOTAL HIP ARTHROPLASTY ANTERIOR APPROACH;  Surgeon: Mcarthur Rossetti, MD;  Location: [facility];  Service: Orthopedics;  Laterality: Right;  . TOTAL HIP ARTHROPLASTY     Left 08/23/17 Dr. Zollie Beckers  . TOTAL HIP ARTHROPLASTY Left 08/23/2017   Procedure: LEFT TOTAL HIP ARTHROPLASTY ANTERIOR APPROACH;  Surgeon: Mcarthur Rossetti, MD;  Location: [facility];  Service: Orthopedics;  Laterality: Left;  . WISDOM TOOTH EXTRACTION      There were no vitals filed for this visit.  Subjective Assessment - 06/22/19 1803    Subjective  Carotid/vertebral revascularization procedure was postponed due to blood return in [place].    Patient is accompained by:  Family member   brother   Currently in Pain?  No/denies            ADULT SLP TREATMENT - 06/22/19 1804      General Information   Behavior/Cognition  Alert;Cooperative      Treatment Provided   Treatment provided  Cognitive-Linquistic      Cognitive-Linquistic Treatment   Treatment focused on  Cognition;Aphasia    Skilled Treatment  SLP administered supplemental reading and writing portions of Western Aphasia Battery-Revised. Reading 60/60, Writing 30/50. Pt wrote primarily in short, incomplete sentences ("Man and woman having picnic.  Boy flying a kite. Man catching a fish. Sailboat"). As >3 weeks since evaluation and pt/brother reporting improvements in cognition, SLP administered MOCA-Basic. Pt scored 24/30 (26 and above is considered WNL). Deficits persist, though perhaps closer to mild. Processing remains slow, problem solving, higher level attention, executive function and awareness impaired.       Assessment / Recommendations / Plan   Plan  Goals updated      Progression Toward Goals   Progression toward goals  Progressing toward goals         SLP Short Term Goals - 06/22/19 1812      SLP SHORT TERM GOAL #1   Title  Pt will maintain selective attention for 15  minutes to a functional task (such as meal planning, billpaying) a moderately distracting environment x3 visits.    Time  4    Period  Weeks    Status  On-going      SLP SHORT TERM GOAL #2   Title  Pt will complete further assessment of language, including reading/writing, with goals added as needed.    Baseline  06/22/19    Time  2    Period  Weeks    Status  Achieved      SLP SHORT TERM GOAL #3   Title  Pt will tell SLP 3 cognitive deficits.    Time  4    Period  Weeks    Status  On-going      SLP SHORT TERM GOAL #4   Title  Pt will utilize compensations for anomia in simple conversation with rare min A over 2 sessions    Time  4    Period  Weeks    Status  On-going      SLP SHORT TERM GOAL #5   Title  Pt will write simple sentences with extended time and occasional min A.    Time  4    Period  Weeks    Status  On-going       SLP Long Term Goals - 06/22/19 1813      SLP LONG TERM GOAL #1   Title  Pt will use external aids/notebook to manage finances, schedule, meal-planning x3 sessions.    Time  8    Period  Weeks    Status  On-going      SLP LONG TERM GOAL #2   Title  Pt will correct 75% of errors in functional therapy tasks with rare min A for double checking.    Time  8    Period  Weeks    Status  On-going      SLP LONG TERM GOAL #3   Title  Pt will use compensations functionally (slowed rate, preplanning) in phone call scenarios x3 sessions.    Time  8    Period  Weeks    Status  Deferred   pt denies difficulty with this 06/22/19     SLP LONG TERM GOAL #4   Title  Pt will utilize compensations for aphasia over 15 minute moderately complex conversation with rare min A over 2 sessions    Time  8    Period  Weeks    Status  On-going       Plan - 06/22/19 1815    Clinical Impression Statement  Pt presents with improving cognitive communication and expressive language deficits, which appear more in range of mild today. Fewer hesitations noted in  conversation today. Deficits persist in executive function, problem solving, awareness,  and higher level attention. Slow processing noted. Pt is currently living alone; brother checks on pt daily. He is cooking (microwave only), and reportedly managing finances and medications without assistance. SLP has advised supervision or at least double checking of these tasks. I recommend skilled ST to address cognitive and language impairments in order to improve safety, independence and for possible return to work.    Speech Therapy Frequency  2x / week    Duration  --   8 weeks or 17 visits   Treatment/Interventions  Compensatory strategies;Patient/family education;Functional tasks;Cueing hierarchy;Environmental controls;Cognitive reorganization;Multimodal communcation approach;Internal/external aids;Compensatory techniques;Language facilitation;SLP instruction and feedback    Potential to Achieve Goals  Good    Potential Considerations  Ability to learn/carryover information    Consulted and Agree with Plan of Care  Patient;Family member/caregiver    Family Member Consulted  brother       Patient will benefit from skilled therapeutic intervention in order to improve the following deficits and impairments:   1. Cognitive communication deficit   2. Aphasia       Problem List Patient Active Problem List   Diagnosis Date Noted  . Non-fluent aphasia   . Acute bilateral cerebral infarction in a watershed distribution [facility]) 05/14/2019  . Vertebral artery narrowing, right 05/12/2019  . Hyperlipidemia 05/06/2019  . CVA (cerebral vascular accident) ([place]) 05/05/2019  . Internal carotid artery occlusion, bilateral   . Covid-19 Virus not Detected   . Status post total replacement of left hip 08/23/2017  . Unilateral primary osteoarthritis, left hip 05/02/2017  . Status post total replacement of right hip 03/22/2017  . PVCs (premature ventricular contractions) 03/14/2017  . Essential hypertension  03/14/2017  . Unilateral primary osteoarthritis, right hip 03/08/2017  . Bilateral hip pain 10/15/2016  . Abnormality of gait 10/15/2016   Deneise Lever, [place], [place] 06/22/2019, 6:18 PM  [facility] [address] [place], [name], [address] Phone: [phone]   Fax:  [phone]   Name: Jaimere Feutz MRN: [id number] Date of Birth: 11-Aug-1951

## 2019-06-23 ENCOUNTER — Other Ambulatory Visit: Payer: Self-pay

## 2019-06-23 ENCOUNTER — Ambulatory Visit: Payer: Commercial Managed Care - PPO | Admitting: Physical Therapy

## 2019-06-23 ENCOUNTER — Ambulatory Visit: Payer: Commercial Managed Care - PPO | Admitting: Speech Pathology

## 2019-06-23 DIAGNOSIS — I6389 Other cerebral infarction: Secondary | ICD-10-CM

## 2019-06-23 NOTE — Telephone Encounter (Signed)
Order put in for outpatient physical therapy per DR.Sethi at [facility].

## 2019-06-23 NOTE — Telephone Encounter (Signed)
ok 

## 2019-06-24 ENCOUNTER — Encounter: Payer: Self-pay | Admitting: Neurology

## 2019-06-24 ENCOUNTER — Other Ambulatory Visit: Payer: Self-pay

## 2019-06-24 ENCOUNTER — Ambulatory Visit (INDEPENDENT_AMBULATORY_CARE_PROVIDER_SITE_OTHER): Payer: Commercial Managed Care - PPO | Admitting: Neurology

## 2019-06-24 VITALS — BP 141/87 | HR 86 | Temp 98.0°F | Ht 68.0 in | Wt 162.2 lb

## 2019-06-24 DIAGNOSIS — I6521 Occlusion and stenosis of right carotid artery: Secondary | ICD-10-CM | POA: Diagnosis not present

## 2019-06-24 NOTE — Patient Instructions (Signed)
I had a long d/w patient about his recent stroke, risk for recurrent stroke/TIAs, personally independently reviewed imaging studies and stroke evaluation results and answered questions.Continue aspirin 81 mg  and Brillinta daily  for secondary stroke prevention and maintain strict control of hypertension with blood pressure goal below 130/90, diabetes with hemoglobin A1c goal below 6.5% and lipids with LDL cholesterol goal below 70 mg/dL. I also advised the patient to eat a healthy diet with plenty of whole grains, cereals, fruits and vegetables, exercise regularly and maintain ideal body weight . Keep appointment withn Dr Estanislado Pandy for elective right ICA stent next week.Followup in the future with me in 3 months or call earlier if necessary

## 2019-06-24 NOTE — Progress Notes (Signed)
[facility] [address] [place]. [name] [address] [phone]       OFFICE FOLLOW-UP NOTE  Jose Kaufman Date of Birth:  01-Nov-1951 Medical Record Number:  [id number]   HPI: Jose Kaufman is a 68 Caucasian male seen today for initial office follow-up visit following hospital admission for stroke in May 2020.  He is accompanied by his son.  History is obtained from them, review of electronic medical records and have personally reviewed imaging films in PACS.  He presented to [facility] long emergency room on 05/05/2019 with sudden onset of aphasia.  His symptoms actually began a few days prior with difficulty in speech comprehension and word finding.  His coworkers noted something was wrong and advised him to go to the emergency room.  He states actually had somewhat similar symptoms a year prior but did not seek medical attention.  CT scan of the head showed no acute abnormality but MRI scan showed old strokes bilaterally involving frontal and parietal regions with new acute cortical infarct in the left frontoparietal region.  CT angiogram showed left ICA occlusion with right ICA string sign.  4 vessel diagnostic cerebral catheter angiogram was performed which confirmed chronic left ICA occlusion with near occlusive stenosis of proximal right ICA and bilateral vertebral artery origin stenosis.  2D echo showed ejection fraction of 60 to 65% with no wall motion abnormality.  Hemoglobin A1c was 5.7.  LDL cholesterol was elevated at 113 mg percent.  Jose Kaufman was started on Lipitor 40 mg daily as well as aspirin and Plavix.  He underwent elective right vertebral origin angioplasty stenting on 05/14/2024 with Dr. Estanislado Pandy uneventfully.  He return for right ICA angioplasty stenting on 06/08/2019 with unfortunately the procedure had to be aborted on the table because he started developing hematuria requiring urgent urological consultation.  Jose Kaufman is rescheduled to undergo right carotid  angioplasty stenting on 06/29/2019 by Dr. the vascular.  Jose Kaufman states overall is doing much better.  He still has some occasional word finding difficulties when he tries to speak quickly or gets tired.  He is has finished home physical occupational and speech therapy and is currently participating in outpatient therapies.  He remains on Lipitor which is tolerating well without muscle aches and pains.  His blood pressure is well controlled and today it is 141/87. ROS:   14 system review of systems is positive for weakness, gait difficulty, decreased hearing, speech and word finding difficulties all other systems negative PMH:  Past Medical History:  Diagnosis Date   Abnormality of gait 10/15/2016   Arthritis    Dysrhythmia    hx of   Essential hypertension 03/14/2017   PVCs (premature ventricular contractions) 03/14/2017   Skin cancer    Stroke [facility])    Wears glasses     Social History:  Social History   Socioeconomic History   Marital status: Divorced    Spouse name: Not on file   Number of children: 2   Years of education: 12   Highest education level: Not on file  Occupational History   Occupation: [employer]   Financial resource strain: Not on file   Food insecurity    Worry: Not on file    Inability: Not on file   Transportation needs    Medical: Not on file    Non-medical: Not on file  Tobacco Use   Smoking status: Former Smoker    Types: Cigarettes    Quit date: 05/20/1992  Years since quitting: 27.1   Smokeless tobacco: Never Used  Substance and Sexual Activity   Alcohol use: Not Currently   Drug use: No   Sexual activity: Yes  Lifestyle   Physical activity    Days per week: Not on file    Minutes per session: Not on file   Stress: Not on file  Relationships   Social connections    Talks on phone: Not on file    Gets together: Not on file    Attends religious service: Not on file    Active member of club or  organization: Not on file    Attends meetings of clubs or organizations: Not on file    Relationship status: Not on file   Intimate partner violence    Fear of current or ex partner: Not on file    Emotionally abused: Not on file    Physically abused: Not on file    Forced sexual activity: Not on file  Other Topics Concern   Not on file  Social History Narrative   Pt lives at home, divorced   Right-handed   Caffeine: soda daily    Medications:   Current Outpatient Medications on File Prior to Visit  Medication Sig Dispense Refill   aspirin EC 81 MG tablet Take 81 mg by mouth daily.     atorvastatin (LIPITOR) 80 MG tablet Take 1 tablet (80 mg total) by mouth daily at 6 PM. 30 tablet 1   ticagrelor (BRILINTA) 90 MG TABS tablet Take 1 tablet (90 mg total) by mouth 2 (two) times daily. 60 tablet 1   No current facility-administered medications on file prior to visit.     Allergies:   Allergies  Allergen Reactions   Penicillins Other (See Comments)    Childhood reaction. Has Jose Kaufman had a PCN reaction causing immediate rash, facial/tongue/throat swelling, SOB or lightheadedness with hypotension: unsure Has Jose Kaufman had a PCN reaction causing severe rash involving mucus membranes or skin necrosis: unsure Has Jose Kaufman had a PCN reaction that required hospitalization:No Has Jose Kaufman had a PCN reaction occurring within the last 10 years:No If all of the above answers are "NO", then may proceed with Cephalosporin use    Physical Exam General: well developed, well nourished middle-aged Caucasian male, seated, in no evident distress Head: head normocephalic and atraumatic.  Neck: supple with soft right carotid   bruit Cardiovascular: regular rate and rhythm, no murmurs Musculoskeletal: no deformity Skin:  no rash/petichiae Vascular:  Normal pulses all extremities Vitals:   06/24/19 0920  BP: (!) 141/87  Pulse: 86  Temp: 98 F (36.7 C)   Neurologic Exam Mental Status:  Awake and fully alert. Oriented to place and time. Recent and remote memory intact. Attention span, concentration and fund of knowledge appropriate. Mood and affect appropriate.  Cranial Nerves: Fundoscopic exam reveals sharp disc margins. Pupils equal, briskly reactive to light. Extraocular movements full without nystagmus. Visual fields full to confrontation. Hearing intact. Facial sensation intact. Face, tongue, palate moves normally and symmetrically.  Motor: Normal bulk and tone. Normal strength in all tested extremity muscles. Sensory.: intact to touch ,pinprick .position and vibratory sensation.  Coordination: Rapid alternating movements normal in all extremities. Finger-to-nose and heel-to-shin performed accurately bilaterally. Gait and Station: Arises from chair without difficulty. Stance is normal. .  Unable to do tandem walking.  Slight gait initiation difficulty with shuffling. Reflexes: 1+ and symmetric. Toes downgoing.   NIHSS  0 Modified Rankin  1   ASSESSMENT: 68 year old Caucasian male  with acute left frontoparietal infarcts in May 2020 likely watershed mechanism given history of chronic left ICA occlusion, near occlusive extracranial right carotid stenosis and severe right vertebral origin stenosis.  He has subsequently had elective right vertebral angioplasty done by Dr Lexine Baton on 6/5/20ar and elective right carotid angioplasty procedure had to be aborted due to Jose Kaufman developing hematuria on the table on 06/08/2019.    PLAN: I had a long d/w Jose Kaufman about his recent stroke, risk for recurrent stroke/TIAs, personally independently reviewed imaging studies and stroke evaluation results and answered questions.Continue aspirin 81 mg  and Brillinta daily  for secondary stroke prevention and maintain strict control of hypertension with blood pressure goal below 130/90, diabetes with hemoglobin A1c goal below 6.5% and lipids with LDL cholesterol goal below 70 mg/dL. I also advised the  Jose Kaufman to eat a healthy diet with plenty of whole grains, cereals, fruits and vegetables, exercise regularly and maintain ideal body weight . Keep appointment withn Dr Estanislado Pandy for elective right ICA stent next week.Followup in the future with me in 3 months or call earlier if necessary Greater than 50% of time during this 25 minute visit was spent on counseling,explanation of diagnosis, planning of further management, discussion with Jose Kaufman and family and coordination of care Antony Contras, MD Medical Director [place] Pager: [phone] 06/24/2019 5:59 PM  Note: This document was prepared with digital dictation and possible smart phrase technology. Any transcriptional errors that result from this process are unintentional

## 2019-06-25 ENCOUNTER — Other Ambulatory Visit: Payer: Self-pay | Admitting: Radiology

## 2019-06-25 ENCOUNTER — Ambulatory Visit: Payer: Commercial Managed Care - PPO | Admitting: Speech Pathology

## 2019-06-25 ENCOUNTER — Encounter: Payer: Self-pay | Admitting: Occupational Therapy

## 2019-06-25 ENCOUNTER — Other Ambulatory Visit: Payer: Self-pay

## 2019-06-25 ENCOUNTER — Ambulatory Visit: Payer: Commercial Managed Care - PPO | Admitting: Occupational Therapy

## 2019-06-25 ENCOUNTER — Encounter (HOSPITAL_COMMUNITY): Payer: Self-pay | Admitting: *Deleted

## 2019-06-25 DIAGNOSIS — R4701 Aphasia: Secondary | ICD-10-CM

## 2019-06-25 DIAGNOSIS — R2681 Unsteadiness on feet: Secondary | ICD-10-CM

## 2019-06-25 DIAGNOSIS — R41842 Visuospatial deficit: Secondary | ICD-10-CM

## 2019-06-25 DIAGNOSIS — I69318 Other symptoms and signs involving cognitive functions following cerebral infarction: Secondary | ICD-10-CM

## 2019-06-25 DIAGNOSIS — R41841 Cognitive communication deficit: Secondary | ICD-10-CM | POA: Diagnosis not present

## 2019-06-25 DIAGNOSIS — R41840 Attention and concentration deficit: Secondary | ICD-10-CM

## 2019-06-25 DIAGNOSIS — R278 Other lack of coordination: Secondary | ICD-10-CM

## 2019-06-25 NOTE — Therapy (Signed)
[facility] [address] [place], [name], [address] Phone: [phone]   Fax:  [phone]  Occupational Therapy Treatment  Patient Details  Name: Jose Kaufman MRN: [id number] Date of Birth: January 09, 1951 Referring Provider (OT): Dr. Letta Pate   Encounter Date: 06/25/2019  OT End of Session - 06/25/19 1004    Visit Number  2    Number of Visits  17    Date for OT Re-Evaluation  07/27/19    Authorization Type  [place] - Visit Number  2    Authorization - Number of Visits  15    OT Start Time  1003    OT Stop Time  1045    OT Time Calculation (min)  42 min    Activity Tolerance  Patient tolerated treatment well    Behavior During Therapy  WFL for tasks assessed/performed       Past Medical History:  Diagnosis Date  . Abnormality of gait 10/15/2016  . Arthritis   . Dysrhythmia    hx of  . Essential hypertension 03/14/2017  . PVCs (premature ventricular contractions) 03/14/2017  . Skin cancer   . Stroke ([place])   . Wears glasses     Past Surgical History:  Procedure Laterality Date  . COLONOSCOPY    . IR ANGIO INTRA EXTRACRAN SEL COM CAROTID INNOMINATE BILAT MOD SED  05/07/2019  . IR ANGIO VERTEBRAL SEL SUBCLAVIAN INNOMINATE BILAT MOD SED  05/07/2019  . IR PATIENT EVAL TECH 0-60 MINS  06/08/2019  . IR TRANSCATH EXCRAN VERT OR CAR A STENT  05/12/2019  . IR US GUIDE VASC ACCESS RIGHT  05/12/2019  . RADIOLOGY WITH ANESTHESIA N/A 05/08/2019   Procedure: RADIOLOGY WITH ANESTHESIA;  Surgeon: [name], MD;  Location: [facility];  Service: Radiology;  Laterality: N/A;  . RADIOLOGY WITH ANESTHESIA N/A 05/12/2019   Procedure: carotid stenting;  Surgeon: [name], MD;  Location: [facility];  Service: Radiology;  Laterality: N/A;  . RADIOLOGY WITH ANESTHESIA N/A 06/08/2019   Procedure: STENT PLACEMENT;  Surgeon: [name], MD;  Location: [facility];  Service: Radiology;  Laterality: N/A;  . SKIN CANCER  EXCISION    . TOTAL HIP ARTHROPLASTY Right 03/22/2017   Procedure: RIGHT TOTAL HIP ARTHROPLASTY ANTERIOR APPROACH;  Surgeon: Mcarthur Rossetti, MD;  Location: [facility];  Service: Orthopedics;  Laterality: Right;  . TOTAL HIP ARTHROPLASTY     Left 08/23/17 Dr. Zollie Beckers  . TOTAL HIP ARTHROPLASTY Left 08/23/2017   Procedure: LEFT TOTAL HIP ARTHROPLASTY ANTERIOR APPROACH;  Surgeon: Mcarthur Rossetti, MD;  Location: [facility];  Service: Orthopedics;  Laterality: Left;  . WISDOM TOOTH EXTRACTION      There were no vitals filed for this visit.  Subjective Assessment - 06/25/19 1004    Patient is accompanied by:  Family member   brother   Pertinent History  05/05/19: acute Lt frontal and parietal watershed infarcts. Pt also had old corticol and subcortical infarcts in both frontal and parietal regions, Bil THA 2018, HTN    Limitations  No driving, not currently working, no heavy lifting, fall risk    Currently in Pain?  No/denies       CLINIC OPERATION CHANGES: Outpatient [facility] is open at lower capacity following universal masking, social distancing, and patient screening.  The patient's COVID risk of complications score is 3.    Typing Test (numbers only) as pt primarily types numbers/addresses with 3wpm, 47%accuracy, and 1wpm net speed.  Then with 6wpm, 91%accuracy, and 5wpm net speed.  Then R-handed typing words with significantly incr time, but good accuracy.  Pt does not have a computer at home.  Noted difficulty with small step size with initiation and turning today.  Pt has referral for PT eval.      OT Education - 06/25/19 1014    Education Details  Coordination HEP--see pt instructions    Person(s) Educated  Patient;Caregiver(s)    Methods  Explanation;Demonstration;Verbal cues;Handout;Tactile cues    Comprehension  Verbalized understanding;Returned demonstration;Verbal cues required       OT Short Term Goals - 05/27/19 1538      OT SHORT TERM GOAL #1    Title  Independent with HEP for coordination - 06/26/19    Time  4    Period  Weeks    Status  New      OT SHORT TERM GOAL #2   Title  Improve coordination bilaterally by 5 sec. or more    Baseline  Rt = 38.47 sec, Lt = 33.93 sec    Time  4    Period  Weeks    Status  New      OT SHORT TERM GOAL #3   Title  Pt to perform tabletop visual scanning to far Rt and Lt side with no more than min questioning cues    Time  4    Period  Weeks    Status  New      OT SHORT TERM GOAL #4   Title  Pt to perform environmental scanning at 75% accuracy in prep for potential driving    Time  4    Period  Weeks    Status  New        OT Long Term Goals - 05/27/19 1543      OT LONG TERM GOAL #1   Title  Pt to perform simple stovetop meal safely with distant supervision - 07/27/19    Time  8    Period  Weeks    Status  New      OT LONG TERM GOAL #2   Title  Pt to perform environmental scanning at 90% accuracy while performing simple physical task for multi-tasking and in prep for driving    Time  8    Period  Weeks    Status  New      OT LONG TERM GOAL #3   Title  Pt to assemble pipe design in prep for functional assembly projects at home w/o cues    Time  8    Period  Weeks    Status  New      OT LONG TERM GOAL #4   Title  Pt to perform computer tasks including searches at mod I level    Time  8    Period  Weeks    Status  New      OT LONG TERM GOAL #5   Title  Typing goal TBD    Time  8    Period  Weeks    Status  New            Plan - 06/25/19 1008    Clinical Impression Statement  Pt demo decr coordination bilateral hands, but has incr difficulty with R dominant hand.  Pt needed incr time for all tasks, but cognition may also be a contributing factor.    OT Occupational Profile and History  Detailed Assessment- Review of Records and additional review of  physical, cognitive, psychosocial history related to current functional performance    Occupational performance  deficits (Please refer to evaluation for details):  ADL's;IADL's;Work;Social Participation    Body Structure / Function / Physical Skills  IADL;Sensation;Strength;UE functional use;FMC;Coordination;GMC;Balance    Cognitive Skills  Attention;Safety Awareness;Sequencing;Understand;Memory;Perception;Problem Solve    Rehab Potential  Fair    Clinical Decision Making  Several treatment options, min-mod task modification necessary    Comorbidities Affecting Occupational Performance:  May have comorbidities impacting occupational performance    Modification or Assistance to Complete Evaluation   Min-Moderate modification of tasks or assist with assess necessary to complete eval    OT Frequency  2x / week    OT Duration  8 weeks    OT Treatment/Interventions  Self-care/ADL training;Therapeutic exercise;Functional Mobility Training;Neuromuscular education;Manual Therapy;Therapeutic activities;DME and/or AE instruction;Cognitive remediation/compensation;Visual/perceptual remediation/compensation;Patient/family education;Passive range of motion    Plan  visual/perceptual skills, continue assess typing and make goal prn    Consulted and Agree with Plan of Care  Patient;Family member/caregiver    Family Member Consulted  brother       Patient will benefit from skilled therapeutic intervention in order to improve the following deficits and impairments:   Body Structure / Function / Physical Skills: IADL, Sensation, Strength, UE functional use, FMC, Coordination, GMC, Balance Cognitive Skills: Attention, Safety Awareness, Sequencing, Understand, Memory, Perception, Problem Solve     Visit Diagnosis: 1. Other lack of coordination   2. Visuospatial deficit   3. Unsteadiness on feet   4. Attention and concentration deficit   5. Other symptoms and signs involving cognitive functions following cerebral infarction       Problem List Patient Active Problem List   Diagnosis Date Noted  . Non-fluent  aphasia   . Acute bilateral cerebral infarction in a watershed distribution [facility]) 05/14/2019  . Vertebral artery narrowing, right 05/12/2019  . Hyperlipidemia 05/06/2019  . CVA (cerebral vascular accident) ([facility]) 05/05/2019  . Internal carotid artery occlusion, bilateral   . Covid-19 Virus not Detected   . Status post total replacement of left hip 08/23/2017  . Unilateral primary osteoarthritis, left hip 05/02/2017  . Status post total replacement of right hip 03/22/2017  . PVCs (premature ventricular contractions) 03/14/2017  . Essential hypertension 03/14/2017  . Unilateral primary osteoarthritis, right hip 03/08/2017  . Bilateral hip pain 10/15/2016  . Abnormality of gait 10/15/2016    [facility] 06/25/2019, 11:00 AM  [facility] [address] [place], [name], [address] Phone: [phone]   Fax:  [phone]  Name: Romelle Reiley MRN: [id number] Date of Birth: 06/17/1951   Vianne Bulls, OTR/L [facility] [address] [place], [place]  [address] [phone] phone [phone] 06/25/19 11:00 AM

## 2019-06-25 NOTE — Therapy (Signed)
[facility] [address] [place], [name], [address] Phone: [phone]   Fax:  [phone]  Speech Language Pathology Treatment  Patient Details  Name: Jose Kaufman MRN: [id number] Date of Birth: Aug 21, 1951 Referring Provider (SLP): Dr.Kirsteins   Encounter Date: 06/25/2019  End of Session - 06/25/19 1344    Visit Number  3    Number of Visits  17    Date for SLP Re-Evaluation  07/24/19    Authorization Type  [facility], 30 visits for OT/PT, 30 separate visits for ST    Authorization - Visit Number  3    Authorization - Number of Visits  30    SLP Start Time  1101    SLP Stop Time   1152    SLP Time Calculation (min)  51 min    Activity Tolerance  Patient tolerated treatment well       Past Medical History:  Diagnosis Date  . Abnormality of gait 10/15/2016  . Arthritis   . Dysrhythmia    hx of  . Essential hypertension 03/14/2017  . PVCs (premature ventricular contractions) 03/14/2017  . Skin cancer   . Stroke ([facility])   . Wears glasses     Past Surgical History:  Procedure Laterality Date  . COLONOSCOPY    . IR ANGIO INTRA EXTRACRAN SEL COM CAROTID INNOMINATE BILAT MOD SED  05/07/2019  . IR ANGIO VERTEBRAL SEL SUBCLAVIAN INNOMINATE BILAT MOD SED  05/07/2019  . IR PATIENT EVAL TECH 0-60 MINS  06/08/2019  . IR TRANSCATH EXCRAN VERT OR CAR A STENT  05/12/2019  . IR US GUIDE VASC ACCESS RIGHT  05/12/2019  . RADIOLOGY WITH ANESTHESIA N/A 05/08/2019   Procedure: RADIOLOGY WITH ANESTHESIA;  Surgeon: [name], MD;  Location: [facility];  Service: Radiology;  Laterality: N/A;  . RADIOLOGY WITH ANESTHESIA N/A 05/12/2019   Procedure: carotid stenting;  Surgeon: [name], MD;  Location: [facility];  Service: Radiology;  Laterality: N/A;  . RADIOLOGY WITH ANESTHESIA N/A 06/08/2019   Procedure: STENT PLACEMENT;  Surgeon: [name], MD;  Location: [facility];  Service: Radiology;  Laterality: N/A;  . SKIN CANCER EXCISION     . TOTAL HIP ARTHROPLASTY Right 03/22/2017   Procedure: RIGHT TOTAL HIP ARTHROPLASTY ANTERIOR APPROACH;  Surgeon: Mcarthur Rossetti, MD;  [facility];  Service: Orthopedics;  Laterality: Right;  . TOTAL HIP ARTHROPLASTY     Left 08/23/17 Dr. Zollie Beckers  . TOTAL HIP ARTHROPLASTY Left 08/23/2017   Procedure: LEFT TOTAL HIP ARTHROPLASTY ANTERIOR APPROACH;  Surgeon: Mcarthur Rossetti, MD;  [facility];  Service: Orthopedics;  Laterality: Left;  . WISDOM TOOTH EXTRACTION      There were no vitals filed for this visit.  Subjective Assessment - 06/25/19 1336    Subjective  Pt with shuffling gait initially after standing.    Patient is accompained by:  Family member    Currently in Pain?  No/denies            ADULT SLP TREATMENT - 06/25/19 1337      General Information   Behavior/Cognition  Alert;Cooperative      Treatment Provided   Treatment provided  Cognitive-Linquistic      Cognitive-Linquistic Treatment   Treatment focused on  Cognition;Aphasia    Skilled Treatment  SLP targeted selective attention and problem solving in a functional grocery planning/budgeting task. Pt's processing during this task was extremely slow; pt required extended time to read directions and approximately minutes to select  between 2 alternatives for a list of 20 items. Pt listed prices, requiring min question cue to review for errors and locate one omission. Pt attempted repeatedly to add his total with a calculator, requiring usual mod cues from SLP to attempt a different strategy (add in smaller sections). Again, min cues required for overlooked items. SLP pointed out errors due to alternating attention as well as impaired problem solving/slow processing. Informed re: need to double check himself in tasks such as balancing his checkbook. Ledger task given for homework; SLP encouraged pt to bring in materials from home to use in therapy sessions.      Assessment / Recommendations /  Plan   Plan  Continue with current plan of care      Progression Toward Goals   Progression toward goals  Progressing toward goals         SLP Short Term Goals - 06/25/19 1337      SLP SHORT TERM GOAL #1   Title  Pt will maintain selective attention for 15 minutes to a functional task (such as meal planning, billpaying) a moderately distracting environment x3 visits.    Baseline  06/25/19    Time  4    Period  Weeks    Status  On-going      SLP SHORT TERM GOAL #2   Title  Pt will complete further assessment of language, including reading/writing, with goals added as needed.    Baseline  06/22/19    Time  2    Period  Weeks    Status  Achieved      SLP SHORT TERM GOAL #3   Title  Pt will tell SLP 3 cognitive deficits.    Time  4    Period  Weeks    Status  On-going      SLP SHORT TERM GOAL #4   Title  Pt will utilize compensations for anomia in simple conversation with rare min A over 2 sessions    Time  4    Period  Weeks    Status  On-going      SLP SHORT TERM GOAL #5   Title  Pt will write simple sentences with extended time and occasional min A.    Time  4    Period  Weeks    Status  On-going       SLP Long Term Goals - 06/25/19 1344      SLP LONG TERM GOAL #1   Title  Pt will use external aids/notebook to manage finances, schedule, meal-planning x3 sessions.    Time  8    Period  Weeks    Status  On-going      SLP LONG TERM GOAL #2   Title  Pt will correct 75% of errors in functional therapy tasks with rare min A for double checking.    Time  8    Period  Weeks    Status  On-going      SLP LONG TERM GOAL #3   Title  Pt will use compensations functionally (slowed rate, preplanning) in phone call scenarios x3 sessions.    Time  8    Period  Weeks    Status  Deferred   pt denies difficulty with this 06/22/19     SLP LONG TERM GOAL #4   Title  Pt will utilize compensations for aphasia over 15 minute moderately complex conversation with rare min A over  2 sessions    Time  8  Period  Weeks    Status  On-going       Plan - 06/25/19 1345    Clinical Impression Statement  Pt presents with improving cognitive communication and expressive language deficits, which appear more in range of mild today. Fewer hesitations noted in conversation today, although some mild stuttering/dysfluency. Deficits persist in executive function, problem solving, awareness, and higher level attention. Slow processing noted. Pt is currently living alone; brother checks on pt daily. He is cooking (microwave only), and reportedly managing finances and medications without assistance. SLP has advised supervision or at least double checking of these tasks. I recommend skilled ST to address cognitive and language impairments in order to improve safety, independence and for possible return to work.    Speech Therapy Frequency  2x / week    Duration  --   8 weeks or 17 visits   Treatment/Interventions  Compensatory strategies;Patient/family education;Functional tasks;Cueing hierarchy;Environmental controls;Cognitive reorganization;Multimodal communcation approach;Internal/external aids;Compensatory techniques;Language facilitation;SLP instruction and feedback    Potential to Achieve Goals  Good    Potential Considerations  Ability to learn/carryover information    Consulted and Agree with Plan of Care  Patient;Family member/caregiver    Family Member Consulted  brother       Patient will benefit from skilled therapeutic intervention in order to improve the following deficits and impairments:   1. Cognitive communication deficit   2. Aphasia       Problem List Patient Active Problem List   Diagnosis Date Noted  . Non-fluent aphasia   . Acute bilateral cerebral infarction in a watershed distribution [facility]) 05/14/2019  . Vertebral artery narrowing, right 05/12/2019  . Hyperlipidemia 05/06/2019  . CVA (cerebral vascular accident) ([place]) 05/05/2019  . Internal carotid  artery occlusion, bilateral   . Covid-19 Virus not Detected   . Status post total replacement of left hip 08/23/2017  . Unilateral primary osteoarthritis, left hip 05/02/2017  . Status post total replacement of right hip 03/22/2017  . PVCs (premature ventricular contractions) 03/14/2017  . Essential hypertension 03/14/2017  . Unilateral primary osteoarthritis, right hip 03/08/2017  . Bilateral hip pain 10/15/2016  . Abnormality of gait 10/15/2016   Deneise Lever, [place], [place] 06/25/2019, 1:47 PM  [facility] [address] [place], [name], [address] Phone: [phone]   Fax:  [phone]   Name: Rishon Thilges MRN: [id number] Date of Birth: 1951/01/01

## 2019-06-25 NOTE — Patient Instructions (Addendum)
   Coordination Activities  Perform the following activities for 20 minutes 1 times per day with both hand(s).   Rotate ball in fingertips (clockwise and counter-clockwise).  Toss ball between hands.  Toss up in the air.  Slow down.  Toss ball in air and catch with the same hand.  Flip cards 1 at a time as fast as you can.  Deal cards with your thumb (Hold deck in hand and push card off top with thumb).  Pick up coins and stack.  Pick up coins one at a time until you get 5-10 in your hand, then move coins from palm to fingertips to stack one at a time.  Practice writing

## 2019-06-25 NOTE — Progress Notes (Signed)
Mr Corter denies chest pain or shortness of breath. Patient denies any s/s of Covid 19 , he will be tested tomorrow and then quarantine at home.   Mr Malinowski had hematuria during one procedure in June, the procedure was stopped. Patien thad a foley catheter at that time; he asked to have a condom this time. I inboxed PA-C- [place], Serafina Mitchell. Mr [facility] sees urology tomorrow.

## 2019-06-26 ENCOUNTER — Encounter (HOSPITAL_COMMUNITY): Payer: Self-pay | Admitting: Physician Assistant

## 2019-06-26 ENCOUNTER — Other Ambulatory Visit (HOSPITAL_COMMUNITY)
Admission: RE | Admit: 2019-06-26 | Discharge: 2019-06-26 | Disposition: A | Discharge status: Home / Self Care | Payer: Commercial Managed Care - PPO | Source: Ambulatory Visit | Attending: Interventional Radiology | Admitting: Interventional Radiology

## 2019-06-26 DIAGNOSIS — Z1159 Encounter for screening for other viral diseases: Secondary | ICD-10-CM | POA: Insufficient documentation

## 2019-06-26 LAB — SARS CORONAVIRUS 2 (TAT 6-24 HRS): SARS Coronavirus 2: NEGATIVE

## 2019-06-26 NOTE — Anesthesia Preprocedure Evaluation (Deleted)
Anesthesia Evaluation    Airway        Dental   Pulmonary former smoker,           Cardiovascular hypertension,      Neuro/Psych    GI/Hepatic   Endo/Other    Renal/GU      Musculoskeletal   Abdominal   Peds  Hematology   Anesthesia Other Findings   Reproductive/Obstetrics                             Anesthesia Physical Anesthesia Plan  ASA:   Anesthesia Plan:    Post-op Pain Management:    Induction:   PONV Risk Score and Plan:   Airway Management Planned:   Additional Equipment:   Intra-op Plan:   Post-operative Plan:   Informed Consent:   Plan Discussed with:   Anesthesia Plan Comments: (CVA 05/05/19. Now s/p elective right vertebral origin angioplasty stenting on 05/14/2024 with Dr. Estanislado Pandy uneventfully.  He returned for right ICA angioplasty stenting on 06/08/2019 - procedure was aborted because he developed frank hematuria after foley insertion. Urology was consulted and now pt is rescheduled for same procedure.   TTE 05/05/19:  1. The left ventricle has normal systolic function with an ejection fraction of 60-65%. The cavity size was normal. Left ventricular diastolic Doppler parameters are consistent with impaired relaxation. No evidence of left ventricular regional wall  motion abnormalities.  2. The right ventricle has normal systolic function. The cavity was normal. There is no increase in right ventricular wall thickness. Right ventricular systolic pressure could not be assessed.  3. The aortic root and ascending aorta are normal in size and structure.)        Anesthesia Quick Evaluation

## 2019-06-29 ENCOUNTER — Ambulatory Visit (HOSPITAL_COMMUNITY)
Admission: RE | Admit: 2019-06-29 | Payer: Commercial Managed Care - PPO | Source: Home / Self Care | Admitting: Interventional Radiology

## 2019-06-29 ENCOUNTER — Encounter (HOSPITAL_COMMUNITY): Admission: RE | Payer: Self-pay | Source: Home / Self Care

## 2019-06-29 ENCOUNTER — Ambulatory Visit (HOSPITAL_COMMUNITY): Admission: RE | Admit: 2019-06-29 | Payer: Commercial Managed Care - PPO | Source: Ambulatory Visit

## 2019-06-29 SURGERY — IR WITH ANESTHESIA
Anesthesia: General

## 2019-06-30 ENCOUNTER — Encounter: Payer: Commercial Managed Care - PPO | Admitting: Speech Pathology

## 2019-06-30 ENCOUNTER — Ambulatory Visit: Payer: Commercial Managed Care - PPO

## 2019-07-02 ENCOUNTER — Encounter: Payer: Commercial Managed Care - PPO | Admitting: Occupational Therapy

## 2019-07-02 ENCOUNTER — Encounter: Payer: Commercial Managed Care - PPO | Admitting: Speech Pathology

## 2019-07-02 ENCOUNTER — Ambulatory Visit: Payer: Commercial Managed Care - PPO

## 2019-07-03 ENCOUNTER — Other Ambulatory Visit: Payer: Self-pay

## 2019-07-03 ENCOUNTER — Encounter: Payer: Commercial Managed Care - PPO | Attending: Registered Nurse | Admitting: Physical Medicine & Rehabilitation

## 2019-07-03 ENCOUNTER — Encounter: Payer: Self-pay | Admitting: Physical Medicine & Rehabilitation

## 2019-07-03 VITALS — BP 129/82 | HR 72 | Temp 97.9°F | Ht 68.0 in | Wt 160.0 lb

## 2019-07-03 DIAGNOSIS — I69390 Apraxia following cerebral infarction: Secondary | ICD-10-CM | POA: Diagnosis not present

## 2019-07-03 DIAGNOSIS — E7849 Other hyperlipidemia: Secondary | ICD-10-CM | POA: Diagnosis present

## 2019-07-03 DIAGNOSIS — R269 Unspecified abnormalities of gait and mobility: Secondary | ICD-10-CM | POA: Diagnosis not present

## 2019-07-03 DIAGNOSIS — I6389 Other cerebral infarction: Secondary | ICD-10-CM | POA: Insufficient documentation

## 2019-07-03 DIAGNOSIS — I69398 Other sequelae of cerebral infarction: Secondary | ICD-10-CM | POA: Diagnosis not present

## 2019-07-03 DIAGNOSIS — I69320 Aphasia following cerebral infarction: Secondary | ICD-10-CM | POA: Diagnosis not present

## 2019-07-03 DIAGNOSIS — I10 Essential (primary) hypertension: Secondary | ICD-10-CM | POA: Insufficient documentation

## 2019-07-03 NOTE — Patient Instructions (Addendum)
No driving until cleared by MD still poor coordination Right hand

## 2019-07-03 NOTE — Progress Notes (Signed)
Subjective:    Patient ID: Jose Kaufman, male    DOB: [birth year], 68 y.o.   MRN: [id number] 68 year old male with history of HTN, PVCs, gait disorder who was admitted on 05/05/19 with one week history of disorientation with confusion, problem walking and difficulty speaking MIR brain done revealing acute infarct in left frontal and parietal lobe. CTA head/nec showed advanced atherosclerosis disease at carotid bifurcation on right with near occlusion of ICA with patent string sign. He was noted to be dehydrated and Dr. Leonie Man felt that watershed stroke due to dehydration in while working outdoors in the heat. He was treated with IVF with recommendations to keep BP > 120. He was started on DAPT and underwent right VA stent assisted angioplasty on 06/02 by Dr. Estanislado Pandy with recommendations to continue ASA/Brillinta. 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 General activity 0 Relation with others 0 Enjoyment of life 0 What TIME of day is your pain at its worst? no pain Sleep (in general) Good  Pain is worse with: no pain Pain improves with: no pain Relief from Meds: 0  Mobility Do you have any goals in this area?  no  Function retired  Neuro/Psych trouble walking  Prior  Studies Any changes since last visit?  no  Physicians involved in your care Any changes since last visit?  yes Urologist   Family History  Problem Relation Age of Onset  . Leukemia Mother   . Pneumonia Father    Social History   Socioeconomic History  . Marital status: Divorced    Spouse name: Not on file  . Number of children: 2  . Years of education: 64  . Highest education level: Not on file  Occupational History  . Occupation: [employer]  . Financial resource strain: Not on file  . Food insecurity    Worry: Not on file    Inability: Not on file  . Transportation needs    Medical: Not on file    Non-medical: Not on file  Tobacco Use  . Smoking status: Former Smoker    Types: Cigarettes    Quit date: 05/20/1992    Years since quitting: 27.1  . Smokeless tobacco: Never Used  Substance and Sexual Activity  . Alcohol use: Not Currently  . Drug use: No  . Sexual activity: Yes  Lifestyle  . Physical activity    Days per week: Not on file    Minutes per session: Not on file  . Stress: Not on file  Relationships  . Social Herbalist on phone: Not on file    Gets together: Not on file    Attends religious service: Not on file    Active member of club  or organization: Not on file    Attends meetings of clubs or organizations: Not on file    Relationship status: Not on file  Other Topics Concern  . Not on file  Social History Narrative   Pt lives at home, divorced   Right-handed   Caffeine: soda daily   Past Surgical History:  Procedure Laterality Date  . COLONOSCOPY    . IR ANGIO INTRA EXTRACRAN SEL COM CAROTID INNOMINATE BILAT MOD SED  05/07/2019  . IR ANGIO VERTEBRAL SEL SUBCLAVIAN INNOMINATE BILAT MOD SED  05/07/2019  . IR PATIENT EVAL TECH 0-60 MINS  06/08/2019  . IR TRANSCATH EXCRAN VERT OR CAR A STENT  05/12/2019  . IR US GUIDE VASC ACCESS RIGHT  05/12/2019  . RADIOLOGY WITH ANESTHESIA N/A 05/08/2019   Procedure: RADIOLOGY WITH  ANESTHESIA;  Surgeon: [name], MD;  Location: [facility];  Service: Radiology;  Laterality: N/A;  . RADIOLOGY WITH ANESTHESIA N/A 05/12/2019   Procedure: carotid stenting;  Surgeon: [name], MD;  Location: [facility];  Service: Radiology;  Laterality: N/A;  . RADIOLOGY WITH ANESTHESIA N/A 06/08/2019   Procedure: STENT PLACEMENT;  Surgeon: [name], MD;  Location: [facility];  Service: Radiology;  Laterality: N/A;  . SKIN CANCER EXCISION    . TOTAL HIP ARTHROPLASTY Right 03/22/2017   Procedure: RIGHT TOTAL HIP ARTHROPLASTY ANTERIOR APPROACH;  Surgeon: Mcarthur Rossetti, MD;  Location: [facility];  Service: Orthopedics;  Laterality: Right;  . TOTAL HIP ARTHROPLASTY Left 08/23/2017   Procedure: LEFT TOTAL HIP ARTHROPLASTY ANTERIOR APPROACH;  Surgeon: Mcarthur Rossetti, MD;  Location: [facility];  Service: Orthopedics;  Laterality: Left;  . WISDOM TOOTH EXTRACTION     Past Medical History:  Diagnosis Date  . Abnormality of gait 10/15/2016  . Arthritis   . Dysrhythmia    hx of  . Essential hypertension 03/14/2017  . PVCs (premature ventricular contractions) 03/14/2017  . Skin cancer    Skin  . Stroke [facility])    Mini strokes . 06/25/2019  Expressive aphasia - improving  . Wears glasses    BP 129/82   Pulse 72   Temp 97.9 F (36.6 C)   Ht 5\' 8"  (1.727 m)   Wt 160 lb (72.6 kg)   SpO2 96%   BMI 24.33 kg/m   Opioid Risk Score:   Fall Risk Score:  `1  Depression screen PHQ 2/9  Depression screen PHQ 2/9 06/03/2019  Decreased Interest 0  Down, Depressed, Hopeless 0  PHQ - 2 Score 0  Altered sleeping 0  Tired, decreased energy 0  Change in appetite 0  Feeling bad or failure about yourself  0  Trouble concentrating 0  Moving slowly or fidgety/restless 1  Suicidal thoughts 0  PHQ-9 Score 1  Difficult doing work/chores Not difficult at all    Review of Systems  Constitutional: Negative.   HENT: Negative.   Eyes: Negative.   Respiratory: Negative.   Cardiovascular:  Negative.   Gastrointestinal: Negative.   Endocrine: Negative.   Genitourinary: Positive for dysuria and hematuria.  Musculoskeletal: Positive for gait problem.  Skin: Negative.   Allergic/Immunologic: Negative.   Hematological: Negative.   Psychiatric/Behavioral: Negative.   All other systems reviewed and are negative.      Objective:   Physical Exam Vitals signs and nursing note reviewed.  Constitutional:      General: He is not in acute distress.    Appearance: Normal appearance. He is normal weight.  HENT:     Head: Normocephalic  and atraumatic.  Eyes:     Extraocular Movements: Extraocular movements intact.     Conjunctiva/sclera: Conjunctivae normal.     Pupils: Pupils are equal, round, and reactive to light.     Comments: Visual fields intact  Cardiovascular:     Rate and Rhythm: Normal rate and regular rhythm.     Heart sounds: Normal heart sounds. No murmur.  Pulmonary:     Effort: Pulmonary effort is normal. No respiratory distress.     Breath sounds: Normal breath sounds. No stridor.  Abdominal:     General: Abdomen is flat. Bowel sounds are normal. There is no distension.     Palpations: Abdomen is soft.  Skin:    General: Skin is warm and dry.  Neurological:     Mental Status: He is alert and oriented to person, place, and time.     Cranial Nerves: Dysarthria present.     Coordination: Coordination abnormal. Finger-Nose-Finger Test abnormal. Impaired rapid alternating movements.     Gait: Gait is intact.     Comments: Manual muscle testing 4/5 right deltoid bicep tricep grip 5/5 left deltoid bicep tricep grip 5/5 bilateral hip flexor knee extensor ankle dorsiflexor Sensation patient reports his equal bilateral upper limbs  Patient has difficulty performing and imitating movements with his right hand.  Evidence of ideomotor apraxia  Patient has difficulty with verbal expression.  He has word finding deficits as well.  Patient can perform standard gait but  has difficulty with toe walking has difficulty imitating movements    Psychiatric:        Mood and Affect: Mood normal.           Assessment & Plan:  1.  Left MCA distribution infarct with residual aphasia, severe ideomotor apraxia. He has regained modified independent status with basic self-care and mobility. We discussed return to work.  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  He is not ready to resume work at this time and may not be able to if his aphasia does not improve. We also discussed driving.  According to his brother who is with him today, he tried driving around the driveway with him and that went pretty well.  We discussed my concerns about his right upper extremity coordination.  Certainly familiar tasks would be easier for him but at this point I would still hold off on driving.  I will reevaluate in 1 month.  We will continue outpatient PT OT speech

## 2019-07-06 ENCOUNTER — Ambulatory Visit: Payer: Commercial Managed Care - PPO | Admitting: Physical Therapy

## 2019-07-07 ENCOUNTER — Ambulatory Visit: Payer: Commercial Managed Care - PPO | Admitting: Occupational Therapy

## 2019-07-07 ENCOUNTER — Other Ambulatory Visit: Payer: Self-pay

## 2019-07-07 ENCOUNTER — Ambulatory Visit: Payer: Commercial Managed Care - PPO | Admitting: Speech Pathology

## 2019-07-07 ENCOUNTER — Ambulatory Visit: Payer: Commercial Managed Care - PPO | Admitting: Physical Therapy

## 2019-07-07 DIAGNOSIS — R41842 Visuospatial deficit: Secondary | ICD-10-CM

## 2019-07-07 DIAGNOSIS — R41841 Cognitive communication deficit: Secondary | ICD-10-CM

## 2019-07-07 DIAGNOSIS — R4701 Aphasia: Secondary | ICD-10-CM

## 2019-07-07 DIAGNOSIS — R278 Other lack of coordination: Secondary | ICD-10-CM

## 2019-07-07 DIAGNOSIS — R41840 Attention and concentration deficit: Secondary | ICD-10-CM

## 2019-07-07 NOTE — Therapy (Signed)
[facility] [address] [place], [name], [address] Phone: [phone]   Fax:  [phone]  Occupational Therapy Treatment  Patient Details  Name: Jose Kaufman MRN: [id number] Date of Birth: [birth year] Referring Provider (OT): Dr. Letta Pate   Encounter Date: 07/07/2019  OT End of Session - 07/07/19 1331    Visit Number  3    Number of Visits  17    Date for OT Re-Evaluation  08/27/19   extended 1 month due to not being seen for a month after evaluation   Authorization Type  [employer]    Authorization - Visit Number  3    Authorization - Number of Visits  15    OT Start Time  1300    OT Stop Time  1345    OT Time Calculation (min)  45 min    Activity Tolerance  Patient tolerated treatment well    Behavior During Therapy  [facility] for tasks assessed/performed       Past Medical History:  Diagnosis Date  . Abnormality of gait 10/15/2016  . Arthritis   . Dysrhythmia    hx of  . Essential hypertension 03/14/2017  . PVCs (premature ventricular contractions) 03/14/2017  . Skin cancer    Skin  . Stroke [facility])    Mini strokes . 06/25/2019  Expressive aphasia - improving  . Wears glasses     Past Surgical History:  Procedure Laterality Date  . COLONOSCOPY    . IR ANGIO INTRA EXTRACRAN SEL COM CAROTID INNOMINATE BILAT MOD SED  05/07/2019  . IR ANGIO VERTEBRAL SEL SUBCLAVIAN INNOMINATE BILAT MOD SED  05/07/2019  . IR PATIENT EVAL TECH 0-60 MINS  06/08/2019  . IR TRANSCATH EXCRAN VERT OR CAR A STENT  05/12/2019  . IR US GUIDE VASC ACCESS RIGHT  05/12/2019  . RADIOLOGY WITH ANESTHESIA N/A 05/08/2019   Procedure: RADIOLOGY WITH ANESTHESIA;  Surgeon: [name], MD;  Location: [place];  Service: Radiology;  Laterality: N/A;  . RADIOLOGY WITH ANESTHESIA N/A 05/12/2019   Procedure: carotid stenting;  Surgeon: [name], MD;  Location: [place];  Service: Radiology;  Laterality: N/A;  . RADIOLOGY WITH ANESTHESIA N/A 06/08/2019   Procedure: STENT PLACEMENT;  Surgeon: [name], MD;  Location: [place];  Service: Radiology;  Laterality: N/A;  . SKIN CANCER EXCISION    . TOTAL HIP ARTHROPLASTY Right 03/22/2017   Procedure: RIGHT TOTAL HIP ARTHROPLASTY ANTERIOR APPROACH;  Surgeon: Mcarthur Rossetti, MD;  [facility];  Service: Orthopedics;  Laterality: Right;  . TOTAL HIP ARTHROPLASTY Left 08/23/2017   Procedure: LEFT TOTAL HIP ARTHROPLASTY ANTERIOR APPROACH;  Surgeon: Mcarthur Rossetti, MD;  [facility];  Service: Orthopedics;  Laterality: Left;  . WISDOM TOOTH EXTRACTION      There were no vitals filed for this visit.  Subjective Assessment - 07/07/19 1330    Subjective   I want to go back to work    Patient is accompanied by:  Family member    Pertinent History  05/05/19: acute Lt frontal and parietal watershed infarcts. Pt also had old corticol and subcortical infarcts in both frontal and parietal regions, Bil THA 2018, HTN    Limitations  No driving, not currently working, no heavy lifting, fall risk    Currently in Pain?  No/denies       CLINIC OPERATION CHANGES: Outpatient Neuro Rehab is open at lower capacity following universal masking, social distancing, and patient screening.  The patient's COVID risk of  complications score is 3.  Practiced typing test again (numbers only) - 4 wpm at 78% accuracy. Pt also noted to lose spot during this (even with visual aid) and skipped a line. Therapist spent significant time trying to decipher pt's job duties and what is required as pt wishes to return to work. However, unsure if pt would be successful at this time and anticipate making multiple mistakes.  Also discussed concerns with driving, particularly with processing speed, decreased attention, and navigation.   Copying small peg design Rt hand for coordination, visual/perceptual, and scanning skills - pt did well overall but had min mistakes near center (due to losing place) and required cues  to correct and help him perform correctly going forward. Pt then had 2 self corrected errors.                      OT Short Term Goals - 05/27/19 1538      OT SHORT TERM GOAL #1   Title  Independent with HEP for coordination - 06/26/19    Time  4    Period  Weeks    Status  New      OT SHORT TERM GOAL #2   Title  Improve coordination bilaterally by 5 sec. or more    Baseline  Rt = 38.47 sec, Lt = 33.93 sec    Time  4    Period  Weeks    Status  New      OT SHORT TERM GOAL #3   Title  Pt to perform tabletop visual scanning to far Rt and Lt side with no more than min questioning cues    Time  4    Period  Weeks    Status  New      OT SHORT TERM GOAL #4   Title  Pt to perform environmental scanning at 75% accuracy in prep for potential driving    Time  4    Period  Weeks    Status  New        OT Long Term Goals - 07/07/19 1636      OT LONG TERM GOAL #1   Title  Pt to perform simple stovetop meal safely with distant supervision - 07/27/19    Time  8    Period  Weeks    Status  New      OT LONG TERM GOAL #2   Title  Pt to perform environmental scanning at 90% accuracy while performing simple physical task for multi-tasking and in prep for driving    Time  8    Period  Weeks    Status  New      OT LONG TERM GOAL #3   Title  Pt to assemble pipe design in prep for functional assembly projects at home w/o cues    Time  8    Period  Weeks    Status  New      OT LONG TERM GOAL #4   Title  Pt to perform computer tasks including searches at mod I level    Time  8    Period  Weeks    Status  New      OT LONG TERM GOAL #5   Title  Pt to type 10 wpm at 80% or greater accuracy (numbers when called out for work simulated tasks)    Time  8    Period  Weeks    Status  New  Plan - 07/07/19 1634    Clinical Impression Statement  Pt with decreased processing speed and appears to have poor insight into cognitive deficits. Pt also has smaller  step patterns    Occupational performance deficits (Please refer to evaluation for details):  ADL's;IADL's;Work;Social Participation    Body Structure / Function / Physical Skills  IADL;Sensation;Strength;UE functional use;FMC;Coordination;GMC;Balance    Cognitive Skills  Attention;Safety Awareness;Sequencing;Understand;Memory;Perception;Problem Solve    Rehab Potential  Fair    OT Frequency  2x / week    OT Duration  8 weeks    OT Treatment/Interventions  Self-care/ADL training;Therapeutic exercise;Functional Mobility Training;Neuromuscular education;Manual Therapy;Therapeutic activities;DME and/or AE instruction;Cognitive remediation/compensation;Visual/perceptual remediation/compensation;Patient/family education;Passive range of motion    Plan  continue tabletop scanning, Rt hand use, try having patient type numbers when called out for simulated work tasks    Consulted and Agree with Plan of Care  Patient;Family member/caregiver    Family Member Consulted  brother       Patient will benefit from skilled therapeutic intervention in order to improve the following deficits and impairments:   Body Structure / Function / Physical Skills: IADL, Sensation, Strength, UE functional use, FMC, Coordination, GMC, Balance Cognitive Skills: Attention, Safety Awareness, Sequencing, Understand, Memory, Perception, Problem Solve     Visit Diagnosis: 1. Visuospatial deficit   2. Attention and concentration deficit   3. Other lack of coordination       Problem List Patient Active Problem List   Diagnosis Date Noted  . Non-fluent aphasia   . Acute bilateral cerebral infarction in a watershed distribution [facility]) 05/14/2019  . Vertebral artery narrowing, right 05/12/2019  . Hyperlipidemia 05/06/2019  . CVA (cerebral vascular accident) ([place]) 05/05/2019  . Internal carotid artery occlusion, bilateral   . Covid-19 Virus not Detected   . Status post total replacement of left hip 08/23/2017  .  Unilateral primary osteoarthritis, left hip 05/02/2017  . Status post total replacement of right hip 03/22/2017  . PVCs (premature ventricular contractions) 03/14/2017  . Essential hypertension 03/14/2017  . Unilateral primary osteoarthritis, right hip 03/08/2017  . Bilateral hip pain 10/15/2016  . Abnormality of gait 10/15/2016    Carey Bullocks, OTR/L 07/07/2019, 4:37 PM  [facility] [address] [place], [name], [address] Phone: [phone]   Fax:  [phone]  Name: Jose Kaufman MRN: [id number] Date of Birth: [birth year]

## 2019-07-07 NOTE — Therapy (Signed)
[facility] [address] [place], [name], [address] Phone: [phone]   Fax:  [phone]  Speech Language Pathology Treatment  Patient Details  Name: Jose Kaufman MRN: [id number] Date of Birth: Oct 03, 1951 Referring Provider (SLP): Dr.Kirsteins   Encounter Date: 07/07/2019  End of Session - 07/07/19 1349    Visit Number  4    Number of Visits  17    Date for SLP Re-Evaluation  07/24/19    Authorization Type  [employer], 30 visits for OT/PT, 30 separate visits for ST    Authorization - Visit Number  4    Authorization - Number of Visits  30    SLP Start Time  1203    SLP Stop Time   1255    SLP Time Calculation (min)  52 min    Activity Tolerance  Patient tolerated treatment well       Past Medical History:  Diagnosis Date  . Abnormality of gait 10/15/2016  . Arthritis   . Dysrhythmia    hx of  . Essential hypertension 03/14/2017  . PVCs (premature ventricular contractions) 03/14/2017  . Skin cancer    Skin  . Stroke [facility])    Mini strokes . 06/25/2019  Expressive aphasia - improving  . Wears glasses     Past Surgical History:  Procedure Laterality Date  . COLONOSCOPY    . IR ANGIO INTRA EXTRACRAN SEL COM CAROTID INNOMINATE BILAT MOD SED  05/07/2019  . IR ANGIO VERTEBRAL SEL SUBCLAVIAN INNOMINATE BILAT MOD SED  05/07/2019  . IR PATIENT EVAL TECH 0-60 MINS  06/08/2019  . IR TRANSCATH EXCRAN VERT OR CAR A STENT  05/12/2019  . IR US GUIDE VASC ACCESS RIGHT  05/12/2019  . RADIOLOGY WITH ANESTHESIA N/A 05/08/2019   Procedure: RADIOLOGY WITH ANESTHESIA;  Surgeon: [name], MD;  Location: [place];  Service: Radiology;  Laterality: N/A;  . RADIOLOGY WITH ANESTHESIA N/A 05/12/2019   Procedure: carotid stenting;  Surgeon: [name], MD;  Location: [place];  Service: Radiology;  Laterality: N/A;  . RADIOLOGY WITH ANESTHESIA N/A 06/08/2019   Procedure: STENT PLACEMENT;  Surgeon: [name], MD;  Location: [place];   Service: Radiology;  Laterality: N/A;  . SKIN CANCER EXCISION    . TOTAL HIP ARTHROPLASTY Right 03/22/2017   Procedure: RIGHT TOTAL HIP ARTHROPLASTY ANTERIOR APPROACH;  Surgeon: Mcarthur Rossetti, MD;  Location: [facility];  Service: Orthopedics;  Laterality: Right;  . TOTAL HIP ARTHROPLASTY Left 08/23/2017   Procedure: LEFT TOTAL HIP ARTHROPLASTY ANTERIOR APPROACH;  Surgeon: Mcarthur Rossetti, MD;  Location: [facility];  Service: Orthopedics;  Laterality: Left;  . WISDOM TOOTH EXTRACTION      There were no vitals filed for this visit.         ADULT SLP TREATMENT - 07/07/19 1348      General Information   Behavior/Cognition  Alert;Cooperative      Treatment Provided   Treatment provided  Cognitive-Linquistic      Cognitive-Linquistic Treatment   Treatment focused on  Cognition;Aphasia    Skilled Treatment  Pt returned with homework completed but no personal materials from home (relating to work or personal finances). Pt required usual min-mod A for awareness of errors in simple mathematical tasks Public house manager and adding coins). SLP also noted disorganization with pt's attempts to fill in worksheet columns at home. SLP facilitated simple-mod complex conversation re: pt's job, as he expresses desire to return to work. 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 Pt required frequent cues for alternating attention, attention to detail, organization and error awareness in min-mod complex checkbooking task. Processing very slow in this task. SLP pointed out strategies to facilitate attention, including checking off completed items, verbalizing digits out loud, and aligning numbers/columns. Pt agreed that he has likely made similar errors in his checkbooks at home. SLP reinforced need to double check all work.       Assessment / Recommendations / Plan   Plan  Continue with current plan of care       Progression Toward Goals   Progression toward goals  Progressing toward goals       SLP Education - 07/07/19 1349    Education Details  strategies for attention, double check all work for errors    [employer]) Educated  Patient   brother   Methods  Explanation    Comprehension  Verbalized understanding;Need further instruction       SLP Short Term Goals - 07/07/19 1351      SLP SHORT TERM GOAL #1   Title  Pt will maintain selective attention for 15 minutes to a functional task (such as meal planning, billpaying) a moderately distracting environment x3 visits.    Baseline  06/25/19, 07/07/19    Time  3    Period  Weeks    Status  On-going      SLP SHORT TERM GOAL #2   Title  Pt will complete further assessment of language, including reading/writing, with goals added as needed.    Baseline  06/22/19    Time  2    Period  Weeks    Status  Achieved      SLP SHORT TERM GOAL #3   Title  Pt will tell SLP 3 cognitive deficits.    Time  3    Period  Weeks    Status  On-going      SLP SHORT TERM GOAL #4   Title  Pt will utilize compensations for anomia in simple conversation with rare min A over 2 sessions    Time  3    Period  Weeks    Status  On-going      SLP SHORT TERM GOAL #5   Title  Pt will write simple sentences with extended time and occasional min A.    Time  3    Period  Weeks    Status  On-going       SLP Long Term Goals - 07/07/19 1352      SLP LONG TERM GOAL #1   Title  Pt will use external aids/notebook to manage finances, schedule, meal-planning x3 sessions.    Time  7    Period  Weeks    Status  On-going      SLP LONG TERM GOAL #2   Title  Pt will correct 75% of errors in functional therapy tasks with rare min A for double checking.    Time  7    Period  Weeks    Status  On-going      SLP LONG TERM GOAL #3   Title  Pt will use compensations functionally (slowed rate, preplanning) in phone call scenarios x3 sessions.    Time  7    Period  Weeks     Status  Deferred   pt denies difficulty with this 06/22/19     SLP LONG TERM GOAL #4   Title  Pt will utilize compensations for aphasia over 15 minute moderately complex conversation with rare min A over 2 sessions    Time  7    Period  Weeks    Status  On-going       Plan - 07/07/19 1350    Clinical Impression Statement  Pt presents with improving cognitive communication and expressive language deficits, which appear more in range of mild today. Fewer hesitations noted in conversation today, although some mild stuttering/dysfluency. Deficits persist in executive function, problem solving, awareness, and higher level attention. Slow processing noted. Pt is currently living alone; brother checks on pt daily. He is cooking (microwave only), and reportedly managing finances and medications without assistance. SLP has advised supervision or at least double checking of these tasks. I recommend skilled ST to address cognitive and language impairments in order to improve safety, independence and for possible return to work.    Speech Therapy Frequency  2x / week    Duration  --   8 weeks or 17 visits   Treatment/Interventions  Compensatory strategies;Patient/family education;Functional tasks;Cueing hierarchy;Environmental controls;Cognitive reorganization;Multimodal communcation approach;Internal/external aids;Compensatory techniques;Language facilitation;SLP instruction and feedback    Potential to Achieve Goals  Good    Potential Considerations  Ability to learn/carryover information    Consulted and Agree with Plan of Care  Patient;Family member/caregiver    Family Member Consulted  brother       Patient will benefit from skilled therapeutic intervention in order to improve the following deficits and impairments:   1. Cognitive communication deficit   2. Aphasia       Problem List Patient Active Problem List   Diagnosis Date Noted  . Non-fluent aphasia   . Acute bilateral cerebral  infarction in a watershed distribution [facility]) 05/14/2019  . Vertebral artery narrowing, right 05/12/2019  . Hyperlipidemia 05/06/2019  . CVA (cerebral vascular accident) ([place]) 05/05/2019  . Internal carotid artery occlusion, bilateral   . Covid-19 Virus not Detected   . Status post total replacement of left hip 08/23/2017  . Unilateral primary osteoarthritis, left hip 05/02/2017  . Status post total replacement of right hip 03/22/2017  . PVCs (premature ventricular contractions) 03/14/2017  . Essential hypertension 03/14/2017  . Unilateral primary osteoarthritis, right hip 03/08/2017  . Bilateral hip pain 10/15/2016  . Abnormality of gait 10/15/2016   Deneise Lever, [place], [place] Speech-Language Pathologist   Aliene Altes 07/07/2019, 2:06 PM  [facility] [address] [place], [name], [address] Phone: [phone]   Fax:  [phone]   Name: Jose Kaufman MRN: [id number] Date of Birth: [birth year]

## 2019-07-08 ENCOUNTER — Telehealth: Payer: Self-pay

## 2019-07-08 NOTE — Telephone Encounter (Signed)
PCP or cardiology will refill this med

## 2019-07-08 NOTE — Telephone Encounter (Signed)
Patient called and requested a medication refill of brillinta, no mention on previous note to continue medication, please advise.

## 2019-07-09 ENCOUNTER — Ambulatory Visit: Payer: Commercial Managed Care - PPO | Admitting: Occupational Therapy

## 2019-07-09 ENCOUNTER — Ambulatory Visit: Payer: Commercial Managed Care - PPO | Admitting: Speech Pathology

## 2019-07-09 ENCOUNTER — Other Ambulatory Visit: Payer: Self-pay

## 2019-07-09 DIAGNOSIS — R4701 Aphasia: Secondary | ICD-10-CM

## 2019-07-09 DIAGNOSIS — R41842 Visuospatial deficit: Secondary | ICD-10-CM

## 2019-07-09 DIAGNOSIS — I69318 Other symptoms and signs involving cognitive functions following cerebral infarction: Secondary | ICD-10-CM

## 2019-07-09 DIAGNOSIS — R41841 Cognitive communication deficit: Secondary | ICD-10-CM

## 2019-07-09 DIAGNOSIS — R41840 Attention and concentration deficit: Secondary | ICD-10-CM

## 2019-07-09 NOTE — Therapy (Signed)
[facility] [address] [name], [name], [address] Phone: [phone]   Fax:  [phone]  Speech Language Pathology Treatment  Patient Details  Name: Jose Kaufman MRN: [id number] Date of Birth: 1951-09-05 Referring Provider (SLP): Dr.Kirsteins   Encounter Date: 07/09/2019  End of Session - 07/09/19 1511    Visit Number  5    Number of Visits  17    Date for SLP Re-Evaluation  07/24/19    Authorization Type  [employer], 30 visits for OT/PT, 30 separate visits for ST    Authorization - Visit Number  4    Authorization - Number of Visits  30    SLP Start Time  1406    SLP Stop Time   1452    SLP Time Calculation (min)  46 min    Activity Tolerance  Patient tolerated treatment well       Past Medical History:  Diagnosis Date  . Abnormality of gait 10/15/2016  . Arthritis   . Dysrhythmia    hx of  . Essential hypertension 03/14/2017  . PVCs (premature ventricular contractions) 03/14/2017  . Skin cancer    Skin  . Stroke [facility])    Mini strokes . 06/25/2019  Expressive aphasia - improving  . Wears glasses     Past Surgical History:  Procedure Laterality Date  . COLONOSCOPY    . IR ANGIO INTRA EXTRACRAN SEL COM CAROTID INNOMINATE BILAT MOD SED  05/07/2019  . IR ANGIO VERTEBRAL SEL SUBCLAVIAN INNOMINATE BILAT MOD SED  05/07/2019  . IR PATIENT EVAL TECH 0-60 MINS  06/08/2019  . IR TRANSCATH EXCRAN VERT OR CAR A STENT  05/12/2019  . IR US GUIDE VASC ACCESS RIGHT  05/12/2019  . RADIOLOGY WITH ANESTHESIA N/A 05/08/2019   Procedure: RADIOLOGY WITH ANESTHESIA;  Surgeon: [name], MD;  Location: [place];  Service: Radiology;  Laterality: N/A;  . RADIOLOGY WITH ANESTHESIA N/A 05/12/2019   Procedure: carotid stenting;  Surgeon: [name], MD;  Location: [place];  Service: Radiology;  Laterality: N/A;  . RADIOLOGY WITH ANESTHESIA N/A 06/08/2019   Procedure: STENT PLACEMENT;  Surgeon: [name], MD;  Location: [place];   Service: Radiology;  Laterality: N/A;  . SKIN CANCER EXCISION    . TOTAL HIP ARTHROPLASTY Right 03/22/2017   Procedure: RIGHT TOTAL HIP ARTHROPLASTY ANTERIOR APPROACH;  Surgeon: Mcarthur Rossetti, MD;  [facility];  Service: Orthopedics;  Laterality: Right;  . TOTAL HIP ARTHROPLASTY Left 08/23/2017   Procedure: LEFT TOTAL HIP ARTHROPLASTY ANTERIOR APPROACH;  Surgeon: Mcarthur Rossetti, MD;  [facility];  Service: Orthopedics;  Laterality: Left;  . WISDOM TOOTH EXTRACTION      There were no vitals filed for this visit.  Subjective Assessment - 07/09/19 1408    Subjective  "Brought my homework. It was a little tricky."    Patient is accompained by:  Family member    Currently in Pain?  No/denies            ADULT SLP TREATMENT - 07/09/19 1508      General Information   Behavior/Cognition  Alert;Cooperative      Treatment Provided   Treatment provided  Cognitive-Linquistic      Pain Assessment   Pain Assessment  No/denies pain      Cognitive-Linquistic Treatment   Treatment focused on  Cognition;Aphasia    Skilled Treatment  Pt homework completed, 100% accuracy but with several areas that had been erased (8 on checkbook task, 2  on receipt task). Pt reported he found many errors when double-checking. SLP praised pt for double checking his work and pt agreed that he would need to double check when working on his [place]. SLP engaged pt in functional task targeting selective/alternating attention as well as expressive/receptive language by simulating telephone orders for parts using [employer] website (pt's employer). Pt required extended time due to slow processing and time required for scanning keyboard for numbers/letters. Pt made several errors (usually 1-3 per serial number), however independently read number back to SLP and corrected his mistakes with extended time. SLP pointed out the length of time required to accurately type 4 serial numbers, and pt stated,  "I'm not used to this computer.") SLP provided pt with calculator to simulate pt's number keypad more closely; SLP pointed out that extended time/errors still occurring. Pt told SLP of a "script" he is to use when answering phone calls at work. In role play of parts order, pt accurately recorded serial number, credit card information with extended time, but had some hesitations and forgot to use 50% of required items in his script (can I help you with anything else?/copy of invoice).       Assessment / Recommendations / Plan   Plan  Continue with current plan of care      Progression Toward Goals   Progression toward goals  Progressing toward goals         SLP Short Term Goals - 07/09/19 1456      SLP SHORT TERM GOAL #1   Title  Pt will maintain selective attention for 15 minutes to a functional task (such as meal planning, billpaying) a moderately distracting environment x3 visits.    Baseline  06/25/19, 07/07/19, 07/09/19    Time  3    Period  Weeks    Status  On-going      SLP SHORT TERM GOAL #2   Title  Pt will complete further assessment of language, including reading/writing, with goals added as needed.    Baseline  06/22/19    Time  2    Period  Weeks    Status  Achieved      SLP SHORT TERM GOAL #3   Title  Pt will tell SLP 3 cognitive deficits.    Time  3    Period  Weeks    Status  On-going      SLP SHORT TERM GOAL #4   Title  Pt will utilize compensations for anomia in simple conversation with rare min A over 2 sessions    Time  3    Period  Weeks    Status  On-going      SLP SHORT TERM GOAL #5   Title  Pt will write simple sentences with extended time and occasional min A.    Time  3    Period  Weeks    Status  On-going       SLP Long Term Goals - 07/09/19 1508      SLP LONG TERM GOAL #1   Title  Pt will use external aids/notebook to manage finances, schedule, meal-planning x3 sessions.    Time  7    Period  Weeks    Status  On-going      SLP LONG TERM  GOAL #2   Title  Pt will correct 75% of errors in functional therapy tasks with rare min A for double checking.    Time  7    Period  Weeks  Status  On-going      SLP LONG TERM GOAL #3   Title  Pt will use compensations functionally (slowed rate, preplanning) in phone call scenarios x3 sessions.    Time  7    Period  Weeks    Status  Deferred   pt denies difficulty with this 06/22/19     SLP LONG TERM GOAL #4   Title  Pt will utilize compensations for aphasia over 15 minute moderately complex conversation with rare min A over 2 sessions    Time  7    Period  Weeks    Status  On-going       Plan - 07/09/19 1508    Clinical Impression Statement  Pt presents with improving cognitive communication and expressive language deficits, which appear more in range of mild today. Mild stuttering, hesistations in telephone role play of taking orders for work. Slow processing persists in all tasks, as well as decreased awareness of deficits and functional impacts. I recommend skilled ST to address cognitive and language impairments in order to improve safety, independence and for possible return to work.    Speech Therapy Frequency  2x / week    Duration  --   8 weeks or 17 visits   Treatment/Interventions  Compensatory strategies;Patient/family education;Functional tasks;Cueing hierarchy;Environmental controls;Cognitive reorganization;Multimodal communcation approach;Internal/external aids;Compensatory techniques;Language facilitation;SLP instruction and feedback    Potential to Achieve Goals  Good    Potential Considerations  Ability to learn/carryover information    Consulted and Agree with Plan of Care  Patient;Family member/caregiver    Family Member Consulted  brother       Patient will benefit from skilled therapeutic intervention in order to improve the following deficits and impairments:   1. Cognitive communication deficit   2. Aphasia       Problem List Patient Active Problem  List   Diagnosis Date Noted  . Non-fluent aphasia   . Acute bilateral cerebral infarction in a watershed distribution [facility]) 05/14/2019  . Vertebral artery narrowing, right 05/12/2019  . Hyperlipidemia 05/06/2019  . CVA (cerebral vascular accident) ([place]) 05/05/2019  . Internal carotid artery occlusion, bilateral   . Covid-19 Virus not Detected   . Status post total replacement of left hip 08/23/2017  . Unilateral primary osteoarthritis, left hip 05/02/2017  . Status post total replacement of right hip 03/22/2017  . PVCs (premature ventricular contractions) 03/14/2017  . Essential hypertension 03/14/2017  . Unilateral primary osteoarthritis, right hip 03/08/2017  . Bilateral hip pain 10/15/2016  . Abnormality of gait 10/15/2016   Deneise Lever, [place], [place] Speech-Language Pathologist  Aliene Altes 07/09/2019, 3:11 PM  [facility] [address] [place], [name], [address] Phone: [phone]   Fax:  [phone]   Name: Calvyn Kurtzman MRN: [id number] Date of Birth: 05-31-1951

## 2019-07-09 NOTE — Therapy (Signed)
[facility] [address] [place], [name], [address] Phone: [phone]   Fax:  [phone]  Occupational Therapy Treatment  Patient Details  Name: Jose Kaufman MRN: [id number] Date of Birth: [birth year] Referring Provider (OT): Dr. Letta Pate   Encounter Date: 07/09/2019  OT End of Session - 07/09/19 1505    Visit Number  4    Number of Visits  17    Date for OT Re-Evaluation  08/27/19    Authorization Type  [place] - Visit Number  4    Authorization - Number of Visits  15    OT Start Time  1300    OT Stop Time  1345    OT Time Calculation (min)  45 min    Activity Tolerance  Patient tolerated treatment well    Behavior During Therapy  [facility] for tasks assessed/performed       Past Medical History:  Diagnosis Date  . Abnormality of gait 10/15/2016  . Arthritis   . Dysrhythmia    hx of  . Essential hypertension 03/14/2017  . PVCs (premature ventricular contractions) 03/14/2017  . Skin cancer    Skin  . Stroke [facility])    Mini strokes . 06/25/2019  Expressive aphasia - improving  . Wears glasses     Past Surgical History:  Procedure Laterality Date  . COLONOSCOPY    . IR ANGIO INTRA EXTRACRAN SEL COM CAROTID INNOMINATE BILAT MOD SED  05/07/2019  . IR ANGIO VERTEBRAL SEL SUBCLAVIAN INNOMINATE BILAT MOD SED  05/07/2019  . IR PATIENT EVAL TECH 0-60 MINS  06/08/2019  . IR TRANSCATH EXCRAN VERT OR CAR A STENT  05/12/2019  . IR US GUIDE VASC ACCESS RIGHT  05/12/2019  . RADIOLOGY WITH ANESTHESIA N/A 05/08/2019   Procedure: RADIOLOGY WITH ANESTHESIA;  Surgeon: [name], MD;  Location: [facility];  Service: Radiology;  Laterality: N/A;  . RADIOLOGY WITH ANESTHESIA N/A 05/12/2019   Procedure: carotid stenting;  Surgeon: [name], MD;  Location: [facility];  Service: Radiology;  Laterality: N/A;  . RADIOLOGY WITH ANESTHESIA N/A 06/08/2019   Procedure: STENT PLACEMENT;  Surgeon: [name], MD;   Location: [facility];  Service: Radiology;  Laterality: N/A;  . SKIN CANCER EXCISION    . TOTAL HIP ARTHROPLASTY Right 03/22/2017   Procedure: RIGHT TOTAL HIP ARTHROPLASTY ANTERIOR APPROACH;  Surgeon: Mcarthur Rossetti, MD;  [facility];  Service: Orthopedics;  Laterality: Right;  . TOTAL HIP ARTHROPLASTY Left 08/23/2017   Procedure: LEFT TOTAL HIP ARTHROPLASTY ANTERIOR APPROACH;  Surgeon: Mcarthur Rossetti, MD;  [facility];  Service: Orthopedics;  Laterality: Left;  . WISDOM TOOTH EXTRACTION      There were no vitals filed for this visit.  Subjective Assessment - 07/09/19 1311    Pertinent History  05/05/19: acute Lt frontal and parietal watershed infarcts. Pt also had old corticol and subcortical infarcts in both frontal and parietal regions, Bil THA 2018, HTN    Limitations  No driving, not currently working, no heavy lifting, fall risk    Currently in Pain?  No/denies             CLINIC OPERATION CHANGES: Outpatient Neuro Rehab is open at lower capacity following universal masking, social distancing, and patient screening.  The patient's COVID risk of complications score is 3.        OT Treatments/Exercises (OP) - 07/09/19 0001      ADLs   Work  Typing out 9  digit numbers when called out (for work related tasks) with decreased processing - pt could do w/ 90% accuracy if called out slower. Pt occasionally flip flopped numbers (25 instead of 52)       Visual/Perceptual Exercises   Maze  Trail making part A - pt started off without difficulty but then had difficulty mid way through finding next number and making errors - ? mental fatigue vs. scotomas vs. internally distracted.     Scanning  Tabletop    Scanning - Tabletop  tabletop scanning to match digital times w/ analogue times w/ increased time to scan and process, 1 error. Pt tended to look more automatically to Rt side.     Other Exercises  Symbols/numbers test with 2 errors               OT  Short Term Goals - 05/27/19 1538      OT SHORT TERM GOAL #1   Title  Independent with HEP for coordination - 06/26/19    Time  4    Period  Weeks    Status  New      OT SHORT TERM GOAL #2   Title  Improve coordination bilaterally by 5 sec. or more    Baseline  Rt = 38.47 sec, Lt = 33.93 sec    Time  4    Period  Weeks    Status  New      OT SHORT TERM GOAL #3   Title  Pt to perform tabletop visual scanning to far Rt and Lt side with no more than min questioning cues    Time  4    Period  Weeks    Status  New      OT SHORT TERM GOAL #4   Title  Pt to perform environmental scanning at 75% accuracy in prep for potential driving    Time  4    Period  Weeks    Status  New        OT Long Term Goals - 07/07/19 1636      OT LONG TERM GOAL #1   Title  Pt to perform simple stovetop meal safely with distant supervision - 07/27/19    Time  8    Period  Weeks    Status  New      OT LONG TERM GOAL #2   Title  Pt to perform environmental scanning at 90% accuracy while performing simple physical task for multi-tasking and in prep for driving    Time  8    Period  Weeks    Status  New      OT LONG TERM GOAL #3   Title  Pt to assemble pipe design in prep for functional assembly projects at home w/o cues    Time  8    Period  Weeks    Status  New      OT LONG TERM GOAL #4   Title  Pt to perform computer tasks including searches at mod I level    Time  8    Period  Weeks    Status  New      OT LONG TERM GOAL #5   Title  Pt to type 10 wpm at 80% or greater accuracy (numbers when called out for work simulated tasks)    Time  8    Period  Weeks    Status  New  Plan - 07/09/19 1506    Clinical Impression Statement  Pt seems to have improved w/ tabletop scanning, but w/ extra time required.    Occupational performance deficits (Please refer to evaluation for details):  ADL's;IADL's;Work;Social Participation    Body Structure / Function / Physical Skills   IADL;Sensation;Strength;UE functional use;FMC;Coordination;GMC;Balance    Cognitive Skills  Attention;Safety Awareness;Sequencing;Understand;Memory;Perception;Problem Solve    Rehab Potential  Fair    OT Frequency  2x / week    OT Duration  8 weeks    OT Treatment/Interventions  Self-care/ADL training;Therapeutic exercise;Functional Mobility Training;Neuromuscular education;Manual Therapy;Therapeutic activities;DME and/or AE instruction;Cognitive remediation/compensation;Visual/perceptual remediation/compensation;Patient/family education;Passive range of motion    Plan  continue progress towards goals    Consulted and Agree with Plan of Care  Patient;Family member/caregiver    Family Member Consulted  brother       Patient will benefit from skilled therapeutic intervention in order to improve the following deficits and impairments:   Body Structure / Function / Physical Skills: IADL, Sensation, Strength, UE functional use, FMC, Coordination, GMC, Balance Cognitive Skills: Attention, Safety Awareness, Sequencing, Understand, Memory, Perception, Problem Solve     Visit Diagnosis: 1. Visuospatial deficit   2. Attention and concentration deficit   3. Other symptoms and signs involving cognitive functions following cerebral infarction       Problem List Patient Active Problem List   Diagnosis Date Noted  . Non-fluent aphasia   . Acute bilateral cerebral infarction in a watershed distribution [facility]) 05/14/2019  . Vertebral artery narrowing, right 05/12/2019  . Hyperlipidemia 05/06/2019  . CVA (cerebral vascular accident) ([place]) 05/05/2019  . Internal carotid artery occlusion, bilateral   . Covid-19 Virus not Detected   . Status post total replacement of left hip 08/23/2017  . Unilateral primary osteoarthritis, left hip 05/02/2017  . Status post total replacement of right hip 03/22/2017  . PVCs (premature ventricular contractions) 03/14/2017  . Essential hypertension 03/14/2017  .  Unilateral primary osteoarthritis, right hip 03/08/2017  . Bilateral hip pain 10/15/2016  . Abnormality of gait 10/15/2016    Carey Bullocks, OTR/L 07/09/2019, 3:08 PM  [facility] [address] [place], [name], [address] Phone: [phone]   Fax:  [phone]  Name: Jose Kaufman MRN: [id number] Date of Birth: 12-18-1950

## 2019-07-13 ENCOUNTER — Encounter: Payer: Self-pay | Admitting: Physical Therapy

## 2019-07-13 ENCOUNTER — Other Ambulatory Visit: Payer: Self-pay

## 2019-07-13 ENCOUNTER — Ambulatory Visit: Payer: Commercial Managed Care - PPO | Attending: Physical Medicine & Rehabilitation | Admitting: Physical Therapy

## 2019-07-13 DIAGNOSIS — R41841 Cognitive communication deficit: Secondary | ICD-10-CM | POA: Diagnosis present

## 2019-07-13 DIAGNOSIS — M6281 Muscle weakness (generalized): Secondary | ICD-10-CM | POA: Diagnosis present

## 2019-07-13 DIAGNOSIS — R41840 Attention and concentration deficit: Secondary | ICD-10-CM | POA: Insufficient documentation

## 2019-07-13 DIAGNOSIS — R2689 Other abnormalities of gait and mobility: Secondary | ICD-10-CM | POA: Diagnosis present

## 2019-07-13 DIAGNOSIS — M25552 Pain in left hip: Secondary | ICD-10-CM | POA: Diagnosis present

## 2019-07-13 DIAGNOSIS — R41842 Visuospatial deficit: Secondary | ICD-10-CM | POA: Insufficient documentation

## 2019-07-13 DIAGNOSIS — R269 Unspecified abnormalities of gait and mobility: Secondary | ICD-10-CM | POA: Insufficient documentation

## 2019-07-13 DIAGNOSIS — R4701 Aphasia: Secondary | ICD-10-CM | POA: Insufficient documentation

## 2019-07-13 DIAGNOSIS — R278 Other lack of coordination: Secondary | ICD-10-CM | POA: Insufficient documentation

## 2019-07-13 DIAGNOSIS — M25551 Pain in right hip: Secondary | ICD-10-CM | POA: Insufficient documentation

## 2019-07-13 DIAGNOSIS — I69318 Other symptoms and signs involving cognitive functions following cerebral infarction: Secondary | ICD-10-CM | POA: Insufficient documentation

## 2019-07-13 DIAGNOSIS — R2681 Unsteadiness on feet: Secondary | ICD-10-CM | POA: Diagnosis present

## 2019-07-13 NOTE — Therapy (Signed)
[facility] [address] [place], [name], [address] Phone: [phone]   Fax:  [phone]  Physical Therapy Evaluation  Patient Details  Name: Jose Kaufman MRN: [id number] Date of Birth: 08-13-1951 Referring Provider (PT): Leonie Man   Encounter Date: 07/13/2019  CLINIC OPERATION CHANGES: Outpatient Neuro Rehab is open at lower capacity following universal masking, social distancing, and patient screening.  The patient's COVID risk of complications score is 3.   PT End of Session - 07/13/19 1129    Visit Number  1    Number of Visits  9    Date for PT Re-Evaluation  09/11/19    Authorization Type  [employer]; 30 VL (PT/OT/aquatic counts as one visit on the same day HARD MAX); speech is separate 30 VL    Authorization - Visit Number  1   As of 07/13/2019, OT had 4 visits; PT initial visit   Authorization - Number of Visits  15       Past Medical History:  Diagnosis Date  . Abnormality of gait 10/15/2016  . Arthritis   . Dysrhythmia    hx of  . Essential hypertension 03/14/2017  . PVCs (premature ventricular contractions) 03/14/2017  . Skin cancer    Skin  . Stroke [facility])    Mini strokes . 06/25/2019  Expressive aphasia - improving  . Wears glasses     Past Surgical History:  Procedure Laterality Date  . COLONOSCOPY    . IR ANGIO INTRA EXTRACRAN SEL COM CAROTID INNOMINATE BILAT MOD SED  05/07/2019  . IR ANGIO VERTEBRAL SEL SUBCLAVIAN INNOMINATE BILAT MOD SED  05/07/2019  . IR PATIENT EVAL TECH 0-60 MINS  06/08/2019  . IR TRANSCATH EXCRAN VERT OR CAR A STENT  05/12/2019  . IR US GUIDE VASC ACCESS RIGHT  05/12/2019  . RADIOLOGY WITH ANESTHESIA N/A 05/08/2019   Procedure: RADIOLOGY WITH ANESTHESIA;  Surgeon: [name], MD;  Location: [facility];  Service: Radiology;  Laterality: N/A;  . RADIOLOGY WITH ANESTHESIA N/A 05/12/2019   Procedure: carotid stenting;  Surgeon: [name], MD;  Location: [facility];  Service: Radiology;   Laterality: N/A;  . RADIOLOGY WITH ANESTHESIA N/A 06/08/2019   Procedure: STENT PLACEMENT;  Surgeon: [name], MD;  Location: [facility];  Service: Radiology;  Laterality: N/A;  . SKIN CANCER EXCISION    . TOTAL HIP ARTHROPLASTY Right 03/22/2017   Procedure: RIGHT TOTAL HIP ARTHROPLASTY ANTERIOR APPROACH;  Surgeon: Mcarthur Rossetti, MD;  [facility];  Service: Orthopedics;  Laterality: Right;  . TOTAL HIP ARTHROPLASTY Left 08/23/2017   Procedure: LEFT TOTAL HIP ARTHROPLASTY ANTERIOR APPROACH;  Surgeon: Mcarthur Rossetti, MD;  [facility];  Service: Orthopedics;  Laterality: Left;  . WISDOM TOOTH EXTRACTION      There were no vitals filed for this visit.   Subjective Assessment - 07/13/19 0855    Subjective  Things going pretty good.  Having some difficulty with my balance.  Has had no falls.  Does not use assistive device.  Feels that R side is weaker.    Pertinent History  Hx of bilateral hip replacement 2018, hx of OA    Patient Stated Goals  Pt's goal for therapy is to walk better.    Currently in Pain?  No/denies         [facility] PT Assessment - 07/13/19 0858      Assessment   Medical Diagnosis  acute watershed infarcts in Lt frontal and parietal lobes    Referring  Provider (PT)  Leonie Man    Onset Date/Surgical Date  05/05/19    Hand Dominance  Right    Prior Therapy  inpatient rehab 05/14/19      Precautions   Precautions  Fall;Other (comment)    Precaution Comments  no driving, no heavy lifting      Balance Screen   Has the patient fallen in the past 6 months  No    Has the patient had a decrease in activity level because of a fear of falling?   Yes    Is the patient reluctant to leave their home because of a fear of falling?   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, taking orders, looks up parts    Leisure  watching sports; walking in neighborhood      Observation/Other Assessments   Focus on Therapeutic Outcomes ([place])   Staff did not capture-not set up in [place] / Strength   AROM / PROM / Strength  Strength;AROM      AROM   Overall AROM   Within functional limits for tasks performed      Strength   Overall Strength  Deficits    Overall Strength Comments  Grossly tested 5/5 LLE, 5/5 RLE hip flexion, knee extension; 4/5 R hamstrings, 4/5 R ankle dorsiflexion      Transfers   Transfers  Sit to Stand;Stand to Sit    Sit to Stand  5: Supervision;With upper extremity assist;From chair/3-in-1    Five time sit to stand comments   15.78    Stand to Sit  5: Supervision;Without upper extremity assist;To chair/3-in-1      Ambulation/Gait   Ambulation/Gait  Yes    Ambulation/Gait Assistance  6: Modified independent (Device/Increase time)    Ambulation Distance (Feet)  200 Feet    Assistive device  None    Gait Pattern  Step-through pattern;Decreased arm swing - right;Decreased arm swing - left;Shuffle   festinating with turns, initiation of gait   Ambulation Surface  Level;Indoor    Gait velocity  12.63 sec = 2.6 ft/sec    Gait Comments  Pt and brother report pt's shuffle steps were present after hip replacement and are not new due to CVA.      Standardized Balance Assessment   Standardized Balance Assessment  Timed Up and Go Test;Dynamic Gait Index      Dynamic Gait Index   Level Surface  Mild Impairment    Change in Gait Speed  Moderate Impairment    Gait with Horizontal Head Turns  Moderate Impairment    Gait with Vertical Head Turns  Moderate  Impairment    Gait and Pivot Turn  Mild Impairment    Step Over Obstacle  Mild Impairment    Step Around Obstacles  Moderate Impairment    Steps  Moderate Impairment  Total Score  11    DGI comment:  Scores <19/24 indicate      Timed Up and Go Test   TUG  Normal TUG    Normal TUG (seconds)  16    TUG Comments  Scores >13.5 sec indicate increased fall risk.                Objective measurements completed on examination: See above findings.                   PT Long Term Goals - 07/13/19 1136      PT LONG TERM GOAL #1   Title  Pt will be independent with HEP for improved balance, strength, and gait.  TARGET 08/21/2019 (delayed due to pt having stent procedure 07/20/2019)    Time  4    Period  Weeks    Status  New    Target Date  08/21/19      PT LONG TERM GOAL #2   Title  Pt will improve 5x sit<>stand to less than or equal to 11.5 sec for improved functional strength.    Time  4    Period  Weeks    Status  New    Target Date  08/21/19      PT LONG TERM GOAL #3   Title  Pt will improve TUG score to less than or equal to 13.5 seconds for decreased fall risk.    Time  4    Period  Weeks    Status  New    Target Date  08/21/19      PT LONG TERM GOAL #4   Title  Pt will improve DGI score to at least 15/24 for decreased fall risk.    Time  4    Period  Weeks    Status  New    Target Date  08/21/19      PT LONG TERM GOAL #5   Title  Pt will verbalize understanding of fall prevention in home environment.    Time  4    Period  Weeks    Status  New    Target Date  08/21/19             Plan - 07/13/19 1132    Clinical Impression Statement  Pt is a 68 year old male who presents to [facility] with history of watershed infarct frontal and parietal region, 05/05/2019.  Pt had inpatient rehab and presents today for PT evaluation, with brother present.  Pt presents with decreased balance, fall risk per TUG and DGI scores, abnormality of gait, decreased  functional lower extremity strength.  Prior to CVA, pt was independent and working.  Pt would benefit from skilled PT to address the above stated deficits to decrease fall risk and improve overall functional mobility.    Personal Factors and Comorbidities  Comorbidity 2    Comorbidities  Hx of OA, hx of bilateral hip replacements    Examination-Activity Limitations  Locomotion Level;Stairs    Examination-Participation Restrictions  Community Activity    Stability/Clinical Decision Making  Stable/Uncomplicated    Clinical Decision Making  Low    Rehab Potential  Good    PT Frequency  2x / week    PT Duration  4 weeks   plus eval   PT Treatment/Interventions  ADLs/Self Care Home Management;Gait training;Stair training;Functional mobility training;Therapeutic activities;Therapeutic exercise;Balance training;Neuromuscular re-education;Patient/family education    PT Next Visit Plan  Initiate HEP for dynamic balance, sit<>stand, functional  lower extremity strengthenining; gait training for improved initiation of gait and turns    Consulted and Agree with Plan of Care  Patient;Family member/caregiver    Family Member Consulted  brother       Patient will benefit from skilled therapeutic intervention in order to improve the following deficits and impairments:  Abnormal gait, Difficulty walking, Decreased balance, Decreased mobility, Decreased strength  Visit Diagnosis: 1. Other abnormalities of gait and mobility   2. Unsteadiness on feet   3. Muscle weakness (generalized)        Problem List Patient Active Problem List   Diagnosis Date Noted  . Non-fluent aphasia   . Acute bilateral cerebral infarction in a watershed distribution [facility]) 05/14/2019  . Vertebral artery narrowing, right 05/12/2019  . Hyperlipidemia 05/06/2019  . CVA (cerebral vascular accident) ([place]) 05/05/2019  . Internal carotid artery occlusion, bilateral   . Covid-19 Virus not Detected   . Status post total replacement  of left hip 08/23/2017  . Unilateral primary osteoarthritis, left hip 05/02/2017  . Status post total replacement of right hip 03/22/2017  . PVCs (premature ventricular contractions) 03/14/2017  . Essential hypertension 03/14/2017  . Unilateral primary osteoarthritis, right hip 03/08/2017  . Bilateral hip pain 10/15/2016  . Abnormality of gait 10/15/2016    Barrie Wale W. 07/13/2019, 11:42 AM  Frazier Butt., PT   [facility] [address] [place], [name], [address] Phone: [phone]   Fax:  [phone]  Name: Maycol Hoying MRN: [id number] Date of Birth: 1951/10/10

## 2019-07-15 ENCOUNTER — Encounter (HOSPITAL_COMMUNITY): Payer: Self-pay | Admitting: *Deleted

## 2019-07-15 ENCOUNTER — Ambulatory Visit: Payer: Commercial Managed Care - PPO | Admitting: Physical Therapy

## 2019-07-15 ENCOUNTER — Other Ambulatory Visit: Payer: Self-pay

## 2019-07-15 DIAGNOSIS — R2689 Other abnormalities of gait and mobility: Secondary | ICD-10-CM

## 2019-07-15 DIAGNOSIS — R2681 Unsteadiness on feet: Secondary | ICD-10-CM

## 2019-07-15 DIAGNOSIS — M6281 Muscle weakness (generalized): Secondary | ICD-10-CM

## 2019-07-15 NOTE — Progress Notes (Signed)
Pt stated that he had hematuria and had to be seen by urology with a previous foley catheter insertion. Pt is requesting to have a condom cath on DOS.

## 2019-07-15 NOTE — Patient Instructions (Signed)
Access Code: [email or web address]  URL: [email or web address]  Date: 07/15/2019  Prepared by: Elsie Ra   Exercises  Sit to Stand without Arm Support - 10 reps - 1-2 sets - 2x daily - 6x weekly  Step Up - 10 reps - 2 sets - 2x daily - 6x weekly  Lateral Step Up - 10 reps - 2 sets - 2x daily - 6x weekly  Forward Step Down Touch with Heel - 10 reps - 3 sets - 2x daily - 6x weekly  Walking with Head Rotation - 10 reps - 3 sets - 2x daily - 6x weekly  Backwards Walking - 10 reps - 3 sets - 2x daily - 6x weekly  Walking Tandem Stance - 10 reps - 3 sets - 2x daily - 6x weekly

## 2019-07-15 NOTE — Therapy (Signed)
[facility] [address] [place], [name], [address] Phone: [phone]   Fax:  [phone]  Physical Therapy Treatment  Patient Details  Name: Jose Kaufman MRN: [id number] Date of Birth: 10/07/1951 Referring Provider (PT): Leonie Man   Encounter Date: 07/15/2019  PT End of Session - 07/15/19 1752    Visit Number  2    Number of Visits  9    Date for PT Re-Evaluation  09/11/19    Authorization Type  UME; 30 VL (PT/OT/aquatic counts as one visit on the same day HARD MAX); speech is separate 30 VL    Authorization - Visit Number  2   As of 07/13/2019, OT had 4 visits; PT initial visit   Authorization - Number of Visits  15    PT Start Time  1700    PT Stop Time  1743    PT Time Calculation (min)  43 min    Activity Tolerance  Patient tolerated treatment well    Behavior During Therapy  [facility] for tasks assessed/performed       Past Medical History:  Diagnosis Date  . Abnormality of gait 10/15/2016  . Arthritis   . Dysrhythmia    hx of  . Essential hypertension 03/14/2017  . PVCs (premature ventricular contractions) 03/14/2017  . Skin cancer    Skin  . Stroke [facility])    Mini strokes . 06/25/2019  Expressive aphasia - improving  . Wears glasses     Past Surgical History:  Procedure Laterality Date  . COLONOSCOPY    . IR ANGIO INTRA EXTRACRAN SEL COM CAROTID INNOMINATE BILAT MOD SED  05/07/2019  . IR ANGIO VERTEBRAL SEL SUBCLAVIAN INNOMINATE BILAT MOD SED  05/07/2019  . IR PATIENT EVAL TECH 0-60 MINS  06/08/2019  . IR TRANSCATH EXCRAN VERT OR CAR A STENT  05/12/2019  . IR US GUIDE VASC ACCESS RIGHT  05/12/2019  . RADIOLOGY WITH ANESTHESIA N/A 05/08/2019   Procedure: RADIOLOGY WITH ANESTHESIA;  Surgeon: [name], MD;  Location: [place];  Service: Radiology;  Laterality: N/A;  . RADIOLOGY WITH ANESTHESIA N/A 05/12/2019   Procedure: carotid stenting;  Surgeon: [name], MD;  Location: [place];  Service: Radiology;   Laterality: N/A;  . RADIOLOGY WITH ANESTHESIA N/A 06/08/2019   Procedure: STENT PLACEMENT;  Surgeon: [name], MD;  Location: [place];  Service: Radiology;  Laterality: N/A;  . SKIN CANCER EXCISION    . TOTAL HIP ARTHROPLASTY Right 03/22/2017   Procedure: RIGHT TOTAL HIP ARTHROPLASTY ANTERIOR APPROACH;  Surgeon: Mcarthur Rossetti, MD;  Location: [facility];  Service: Orthopedics;  Laterality: Right;  . TOTAL HIP ARTHROPLASTY Left 08/23/2017   Procedure: LEFT TOTAL HIP ARTHROPLASTY ANTERIOR APPROACH;  Surgeon: Mcarthur Rossetti, MD;  Location: [facility];  Service: Orthopedics;  Laterality: Left;  . WISDOM TOOTH EXTRACTION      There were no vitals filed for this visit.  Subjective Assessment - 07/15/19 1751    Subjective  relays he feels good today, no complaints of pain, will have stents placed next week    Pertinent History  Hx of bilateral hip replacement 2018, hx of OA    Patient Stated Goals  Pt's goal for therapy is to walk better.    Currently in Pain?  No/denies                       [facility] Adult PT Treatment/Exercise - 07/15/19 0001      Ambulation/Gait  Gait Comments  3 laps warm up (330 ft) supervision      High Level Balance   High Level Balance Comments  in bars, gait with head turns up/down then head turns lateral, then retro walk and then tandem walk (did need one UE support for tandem), all others intermit UE support as needed, all down and back X 3 ea      Exercises   Exercises  Knee/Hip      Knee/Hip Exercises: Standing   Other Standing Knee Exercises  step ups fwd, lateral, and step downs X 15 each on Rt leg,      Knee/Hip Exercises: Seated   Sit to Sand  2 sets;10 reps;without UE support      Knee/Hip Exercises: Supine   Bridges  20 reps    Straight Leg Raises  Both;15 reps      Manual Therapy   Manual therapy comments  passive ROM bilat hips for flexion, IR, ER, manual stretching for hamstrings and glutes bilat                   PT Long Term Goals - 07/13/19 1136      PT LONG TERM GOAL #1   Title  Pt will be independent with HEP for improved balance, strength, and gait.  TARGET 08/21/2019 (delayed due to pt having stent procedure 07/20/2019)    Time  4    Period  Weeks    Status  New    Target Date  08/21/19      PT LONG TERM GOAL #2   Title  Pt will improve 5x sit<>stand to less than or equal to 11.5 sec for improved functional strength.    Time  4    Period  Weeks    Status  New    Target Date  08/21/19      PT LONG TERM GOAL #3   Title  Pt will improve TUG score to less than or equal to 13.5 seconds for decreased fall risk.    Time  4    Period  Weeks    Status  New    Target Date  08/21/19      PT LONG TERM GOAL #4   Title  Pt will improve DGI score to at least 15/24 for decreased fall risk.    Time  4    Period  Weeks    Status  New    Target Date  08/21/19      PT LONG TERM GOAL #5   Title  Pt will verbalize understanding of fall prevention in home environment.    Time  4    Period  Weeks    Status  New    Target Date  08/21/19            Plan - 07/15/19 1721    Clinical Impression Statement  Session today focused on creating and going over HEP for dynamic balance and functional strengthening. He had good tolerance with all activites but does get unsteady with turning and using head turns. He also has shuffling gait at times. PT will continue to progress as able.    Personal Factors and Comorbidities  Comorbidity 2    Comorbidities  Hx of OA, hx of bilateral hip replacements    Examination-Activity Limitations  Locomotion Level;Stairs    Examination-Participation Restrictions  Community Activity    Stability/Clinical Decision Making  Stable/Uncomplicated    Rehab Potential  Good    PT Frequency  2x / week    PT Duration  4 weeks   plus eval   PT Treatment/Interventions  ADLs/Self Care Home Management;Gait training;Stair training;Functional mobility  training;Therapeutic activities;Therapeutic exercise;Balance training;Neuromuscular re-education;Patient/family education    PT Next Visit Plan  dynamic balance, sit<>stand, functional lower extremity strengthenining; gait training for improved initiation of gait and turns    PT Home Exercise Plan  Access Code: [email or web address]    Consulted and Agree with Plan of Care  Patient;Family member/caregiver    Family Member Consulted  brother       Patient will benefit from skilled therapeutic intervention in order to improve the following deficits and impairments:  Abnormal gait, Difficulty walking, Decreased balance, Decreased mobility, Decreased strength  Visit Diagnosis: 1. Other abnormalities of gait and mobility   2. Unsteadiness on feet   3. Muscle weakness (generalized)        Problem List Patient Active Problem List   Diagnosis Date Noted  . Non-fluent aphasia   . Acute bilateral cerebral infarction in a watershed distribution [facility]) 05/14/2019  . Vertebral artery narrowing, right 05/12/2019  . Hyperlipidemia 05/06/2019  . CVA (cerebral vascular accident) ([place]) 05/05/2019  . Internal carotid artery occlusion, bilateral   . Covid-19 Virus not Detected   . Status post total replacement of left hip 08/23/2017  . Unilateral primary osteoarthritis, left hip 05/02/2017  . Status post total replacement of right hip 03/22/2017  . PVCs (premature ventricular contractions) 03/14/2017  . Essential hypertension 03/14/2017  . Unilateral primary osteoarthritis, right hip 03/08/2017  . Bilateral hip pain 10/15/2016  . Abnormality of gait 10/15/2016    Silvestre Mesi 07/15/2019, 5:57 PM  [facility] [address] [place], [name], [address] Phone: [phone]   Fax:  [phone]  Name: Jose Kaufman MRN: [id number] Date of Birth: [birth year]

## 2019-07-15 NOTE — Progress Notes (Signed)
Pt denies SOB, chest pain, and being under the care of a cardiologist. Pt denies having a stress test and cardiac cath. Pt made aware to stop taking vitamins, fish oil and herbal medications. Do not take any NSAIDs ie: Ibuprofen, Advil, Naproxen (Aleve), Motrin, BC and Goody Powder.  Pt denies that he and family members tested positive for COVID-19 ( pt stated that he is scheduled to be tested on Thursday, 07/16/19 and reminded to quarantine). Pt denies that he and family members experienced the following symptoms:  Cough yes/no: No Fever (>100.50F)  yes/no: No Runny nose yes/no: No Sore throat yes/no: No Difficulty breathing/shortness of breath  yes/no: No  Have you or a family member traveled in the last 14 days and where? yes/no: No  Pt reminded that hospital visitation restrictions are in effect and the importance of the restrictions.   Pt verbalized understanding of all pre-op instructions.

## 2019-07-16 ENCOUNTER — Other Ambulatory Visit: Payer: Self-pay | Admitting: Radiology

## 2019-07-16 ENCOUNTER — Other Ambulatory Visit (HOSPITAL_COMMUNITY)
Admission: RE | Admit: 2019-07-16 | Discharge: 2019-07-16 | Disposition: A | Discharge status: Home / Self Care | Payer: Commercial Managed Care - PPO | Source: Ambulatory Visit | Attending: Interventional Radiology | Admitting: Interventional Radiology

## 2019-07-16 DIAGNOSIS — Z01812 Encounter for preprocedural laboratory examination: Secondary | ICD-10-CM | POA: Diagnosis not present

## 2019-07-16 DIAGNOSIS — Z20828 Contact with and (suspected) exposure to other viral communicable diseases: Secondary | ICD-10-CM | POA: Insufficient documentation

## 2019-07-16 LAB — SARS CORONAVIRUS 2 (TAT 6-24 HRS): SARS Coronavirus 2: NEGATIVE

## 2019-07-17 ENCOUNTER — Other Ambulatory Visit: Payer: Self-pay | Admitting: Radiology

## 2019-07-20 ENCOUNTER — Other Ambulatory Visit: Payer: Self-pay

## 2019-07-20 ENCOUNTER — Ambulatory Visit (HOSPITAL_COMMUNITY): Payer: Commercial Managed Care - PPO | Admitting: Anesthesiology

## 2019-07-20 ENCOUNTER — Observation Stay (HOSPITAL_COMMUNITY)
Admission: AD | Admit: 2019-07-20 | Discharge: 2019-07-21 | Disposition: A | Discharge status: Home / Self Care | Payer: Commercial Managed Care - PPO | Attending: Interventional Radiology | Admitting: Interventional Radiology

## 2019-07-20 ENCOUNTER — Ambulatory Visit (HOSPITAL_COMMUNITY)
Admission: RE | Admit: 2019-07-20 | Discharge: 2019-07-20 | Disposition: A | Discharge status: Home / Self Care | Payer: Commercial Managed Care - PPO | Source: Ambulatory Visit | Attending: Interventional Radiology | Admitting: Interventional Radiology

## 2019-07-20 ENCOUNTER — Encounter (HOSPITAL_COMMUNITY)
Admission: AD | Disposition: A | Discharge status: Home / Self Care | Payer: Self-pay | Source: Home / Self Care | Attending: Interventional Radiology

## 2019-07-20 ENCOUNTER — Encounter (HOSPITAL_COMMUNITY): Payer: Self-pay

## 2019-07-20 DIAGNOSIS — Z880 Allergy status to penicillin: Secondary | ICD-10-CM | POA: Diagnosis not present

## 2019-07-20 DIAGNOSIS — I10 Essential (primary) hypertension: Secondary | ICD-10-CM | POA: Diagnosis not present

## 2019-07-20 DIAGNOSIS — Z87891 Personal history of nicotine dependence: Secondary | ICD-10-CM | POA: Insufficient documentation

## 2019-07-20 DIAGNOSIS — I6521 Occlusion and stenosis of right carotid artery: Secondary | ICD-10-CM | POA: Insufficient documentation

## 2019-07-20 DIAGNOSIS — I6509 Occlusion and stenosis of unspecified vertebral artery: Secondary | ICD-10-CM | POA: Diagnosis present

## 2019-07-20 DIAGNOSIS — Z79899 Other long term (current) drug therapy: Secondary | ICD-10-CM | POA: Insufficient documentation

## 2019-07-20 DIAGNOSIS — Z8673 Personal history of transient ischemic attack (TIA), and cerebral infarction without residual deficits: Secondary | ICD-10-CM | POA: Insufficient documentation

## 2019-07-20 DIAGNOSIS — I771 Stricture of artery: Secondary | ICD-10-CM

## 2019-07-20 DIAGNOSIS — I6502 Occlusion and stenosis of left vertebral artery: Principal | ICD-10-CM | POA: Diagnosis present

## 2019-07-20 DIAGNOSIS — Z7982 Long term (current) use of aspirin: Secondary | ICD-10-CM | POA: Insufficient documentation

## 2019-07-20 DIAGNOSIS — I69328 Other speech and language deficits following cerebral infarction: Secondary | ICD-10-CM | POA: Insufficient documentation

## 2019-07-20 DIAGNOSIS — R4781 Slurred speech: Secondary | ICD-10-CM | POA: Diagnosis not present

## 2019-07-20 HISTORY — PX: IR ANGIO VERTEBRAL SEL SUBCLAVIAN INNOMINATE UNI R MOD SED: IMG5365

## 2019-07-20 HISTORY — PX: IR ANGIO INTRA EXTRACRAN SEL COM CAROTID INNOMINATE BILAT MOD SED: IMG5360

## 2019-07-20 HISTORY — PX: RADIOLOGY WITH ANESTHESIA: SHX6223

## 2019-07-20 HISTORY — PX: IR TRANSCATH EXCRAN VERT OR CAR A STENT: IMG1955

## 2019-07-20 LAB — URINALYSIS, ROUTINE W REFLEX MICROSCOPIC
Bilirubin Urine: NEGATIVE
Glucose, UA: NEGATIVE mg/dL
Ketones, ur: NEGATIVE mg/dL
Leukocytes,Ua: NEGATIVE
Nitrite: NEGATIVE
Protein, ur: NEGATIVE mg/dL
Specific Gravity, Urine: 1.017 (ref 1.005–1.030)
pH: 5 (ref 5.0–8.0)

## 2019-07-20 LAB — ABO/RH: ABO/RH(D): O POS

## 2019-07-20 LAB — POCT ACTIVATED CLOTTING TIME
Activated Clotting Time: 180 seconds
Activated Clotting Time: 202 seconds
Activated Clotting Time: 98 seconds

## 2019-07-20 LAB — BASIC METABOLIC PANEL
Anion gap: 11 (ref 5–15)
BUN: 17 mg/dL (ref 8–23)
CO2: 20 mmol/L — ABNORMAL LOW (ref 22–32)
Calcium: 9.2 mg/dL (ref 8.9–10.3)
Chloride: 107 mmol/L (ref 98–111)
Creatinine, Ser: 1.09 mg/dL (ref 0.61–1.24)
GFR calc Af Amer: >60 mL/min (ref >60)
GFR calc non Af Amer: >60 mL/min (ref >60)
Glucose, Bld: 102 mg/dL — ABNORMAL HIGH (ref 70–99)
Potassium: 3.8 mmol/L (ref 3.5–5.1)
Sodium: 138 mmol/L (ref 135–145)

## 2019-07-20 LAB — CBC WITH DIFFERENTIAL/PLATELET
Abs Immature Granulocytes: 0.03 10*3/uL (ref 0.00–0.07)
Basophils Absolute: 0.1 10*3/uL (ref 0.0–0.1)
Basophils Relative: 1 %
Eosinophils Absolute: 0.6 10*3/uL — ABNORMAL HIGH (ref 0.0–0.5)
Eosinophils Relative: 7 %
HCT: 50 % (ref 39.0–52.0)
Hemoglobin: 16.3 g/dL (ref 13.0–17.0)
Immature Granulocytes: 0 %
Lymphocytes Relative: 17 %
Lymphs Abs: 1.3 10*3/uL (ref 0.7–4.0)
MCH: 30.6 pg (ref 26.0–34.0)
MCHC: 32.6 g/dL (ref 30.0–36.0)
MCV: 93.8 fL (ref 80.0–100.0)
Monocytes Absolute: 0.6 10*3/uL (ref 0.1–1.0)
Monocytes Relative: 8 %
Neutro Abs: 5.2 10*3/uL (ref 1.7–7.7)
Neutrophils Relative %: 67 %
Platelets: 196 10*3/uL (ref 150–400)
RBC: 5.33 MIL/uL (ref 4.22–5.81)
RDW: 13.6 % (ref 11.5–15.5)
WBC: 7.8 10*3/uL (ref 4.0–10.5)
nRBC: 0 % (ref 0.0–0.2)

## 2019-07-20 LAB — PROTIME-INR
INR: 1.1 (ref 0.8–1.2)
Prothrombin Time: 13.6 seconds (ref 11.4–15.2)

## 2019-07-20 LAB — MRSA PCR SCREENING: MRSA by PCR: NEGATIVE

## 2019-07-20 LAB — TYPE AND SCREEN
ABO/RH(D): O POS
Antibody Screen: NEGATIVE

## 2019-07-20 LAB — HEPARIN LEVEL (UNFRACTIONATED): Heparin Unfractionated: <0.1 IU/mL — ABNORMAL LOW (ref 0.30–0.70)

## 2019-07-20 LAB — PLATELET INHIBITION P2Y12: Platelet Function  P2Y12: 38 [PRU] — ABNORMAL LOW (ref 182–335)

## 2019-07-20 IMAGING — XA IR TRANSCATH EXCRAN VERT OR CAR A STENT
8 of 24 series · 8 of 24 positions shown · IV contrast (IODINE)
Comparison: Diagnostic catheter arteriogram [DATE].

CLINICAL DATA: History of vertebrobasilar insufficiency with severe
extracranial cerebrovascular disease involving the carotid arteries
and vertebral arteries. Status post previous right vertebral artery
origin stent assisted angioplasty. Known history of occluded left
internal carotid artery severely stenotic right internal carotid
artery, and severe origin stenosis of the dominant left vertebral
artery

EXAM:
IR TRANSCATH EXCRAN VERT OR CAR A STENT; IR ANGIO VERTEBRAL SEL
SUBCLAVIAN INNOMINATE UNI RIGHT MOD SED; BILATERAL COMMON CAROTID
AND INNOMINATE ANGIOGRAPHY
TECHNIQUE: Informed written consent was obtained from the patient after a
thorough discussion of the procedural risks, benefits and
alternatives. All questions were addressed. Maximal Sterile Barrier
Technique was utilized including caps, mask, sterile gowns, sterile
gloves, sterile drape, hand hygiene and skin antiseptic. A timeout
was performed prior to the initiation of the procedure.

[Series 2: cerebral · 1 of 10 slices shown (1 of 8)]
[im 1/10]
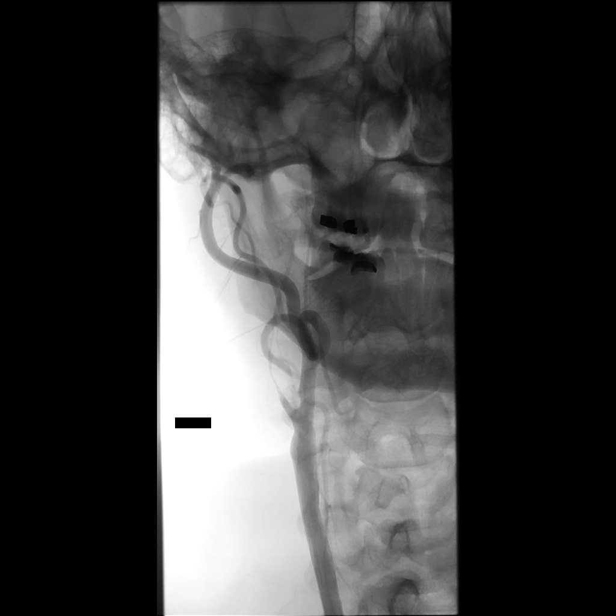

[Series 5: cerebral · 1 of 16 slices shown (2 of 8)]
[im 1/16]
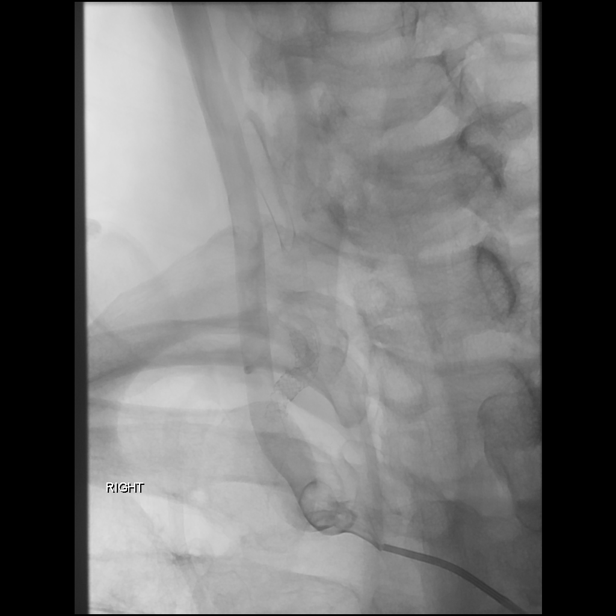

[Series 8: cerebral · 1 of 8 slices shown (3 of 8)]
[im 1/8]
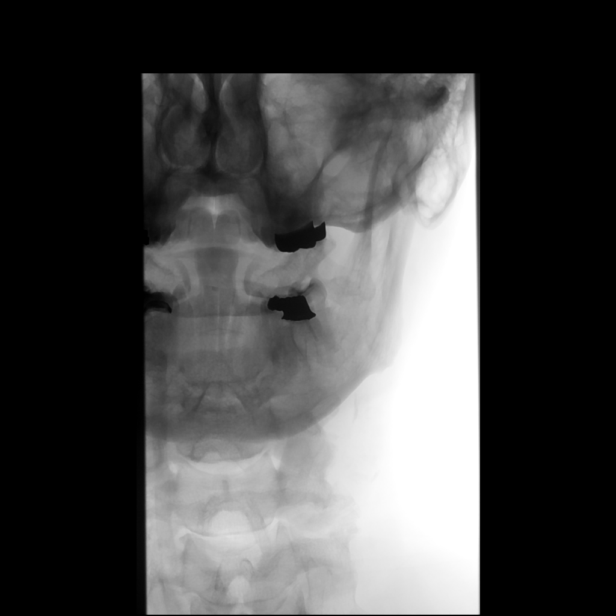

[Series 11: cerebral · 1 of 10 slices shown (4 of 8)]
[im 1/10]
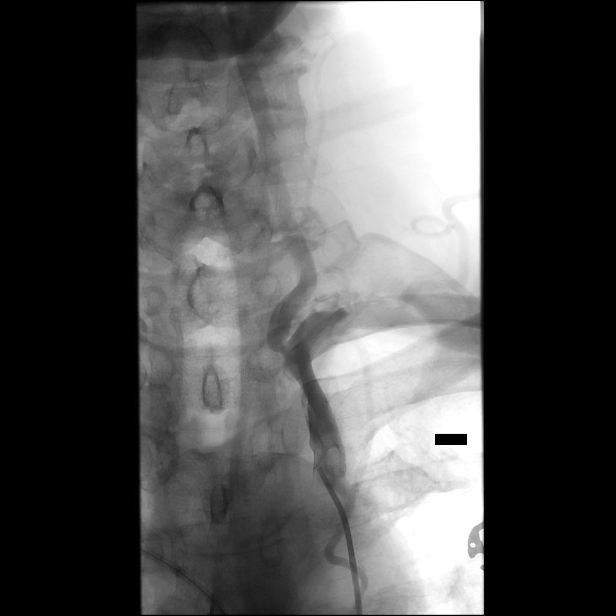

[Series 14: cerebral · 1 of 10 slices shown (5 of 8)]
[im 1/10]
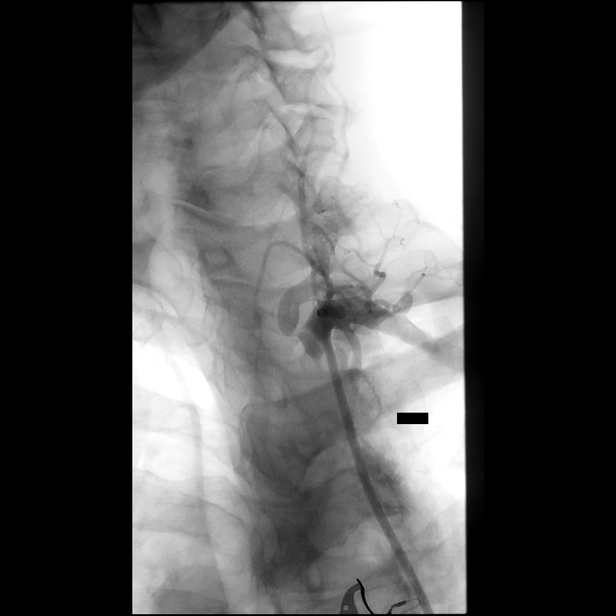

[Series 17: cerebral · 1 of 7 slices shown (6 of 8)]
[im 1/7]
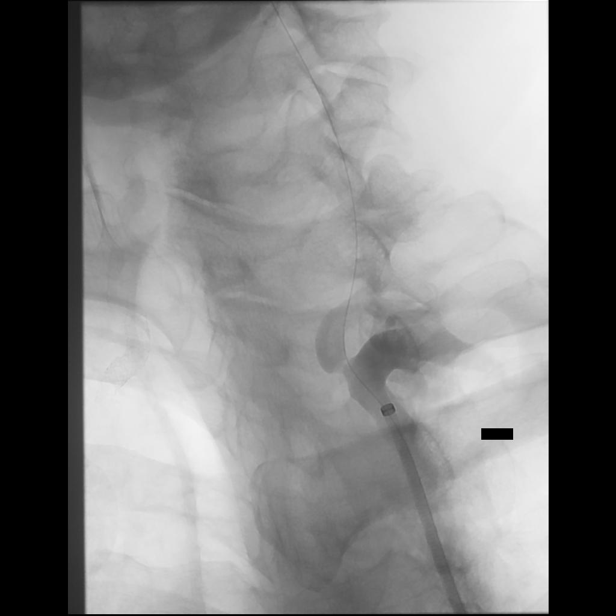

[Series 20: cerebral · 1 of 5 slices shown (7 of 8)]
[im 1/5]
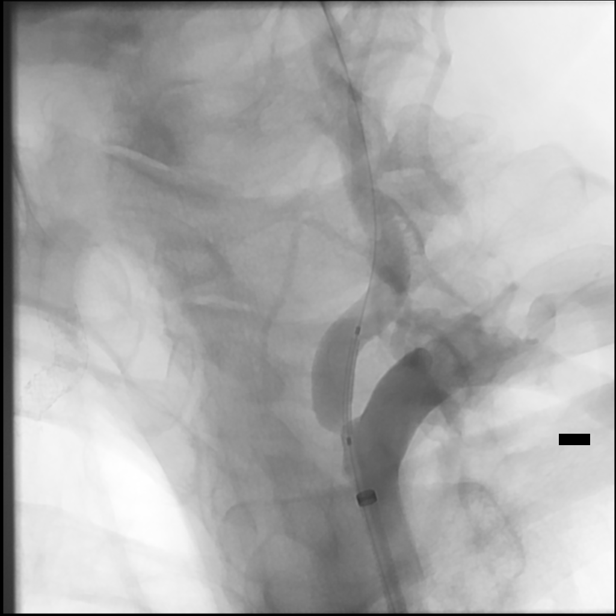

[Series 23: cerebral · 1 of 18 slices shown (8 of 8)]
[im 1/18]
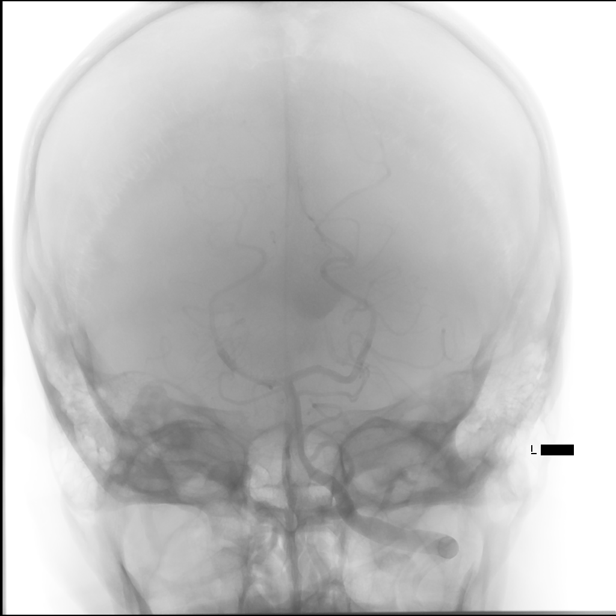

[8 of 24 positions shown; findings below may reference images not displayed]

MEDICATIONS:
Heparin 3,500 units IV. Vancomycin 1 g IV antibiotic was
administered within 1 hour of the procedure.

ANESTHESIA/SEDATION:
Mac anesthesia as per the [REDACTED] at [REDACTED].

CONTRAST:  Isovue 300 approximately 120 mL.

FLUOROSCOPY TIME:  Fluoroscopy Time: 26 minutes 30 seconds [6N]
mGy).

COMPLICATIONS:
None immediate.
The right groin was prepped and draped in the usual sterile fashion.
Thereafter using modified Seldinger technique, transfemoral access
into the right common femoral artery was obtained without
difficulty. Over a 0.035 inch guidewire, a 5 French Pinnacle sheath
was inserted. Through this, and also over 0.035 inch guidewire, a 5
French JB 1 catheter was advanced to the aortic arch region and
selectively positioned in the right common carotid artery, the right
subclavian artery, the left common carotid artery and the left
vertebral artery.
FINDINGS: The right subclavian arteriogram just proximal to the origin of the
right vertebral artery demonstrates wide patency of the previously
positioned stent at the origin of the right vertebral artery.

There is stenosis of approximately 25-50% with within the stent at
the origin of the right vertebral artery.

More distally the vessel is seen to opacify to the cranial skull
base. Wide patency is seen of the right vertebrobasilar junction and
the right posterior-inferior cerebellar artery.

The opacified portion of the basilar artery, the posterior cerebral
arteries, the superior cerebellar arteries and the anterior-inferior
cerebellar arteries is grossly intact into the delayed capillary
phase. Inflow of unopacified blood is seen in the mid basilar artery
from the contralateral vertebral artery.

The right common carotid arteriogram demonstrates the right external
carotid artery and its major branches to be widely patent.

The right internal carotid artery at the bulb to the cranial skull
base demonstrates a string sign angiographic appearance. This
continues to the proximal cavernous segment. There is a brisk
revascularization of the caval cavernous and supraclinoid right ICA
via the ipsilateral ophthalmic artery from the right external
carotid artery branches involving the nasolacrimal region. Also
demonstrated is an area of severe focal stenosis of the petrous
cavernous junction.

Opacification is seen of the right middle cerebral artery and the
right anterior cerebral artery. Also demonstrated is cross-filling
via the anterior communicating artery of the left anterior cerebral
A2 segment and distally.

The delayed arterial phase demonstrates an area of hypoperfusion
involving the mid frontal cortical and subcortical regions.

The left common carotid arteriogram demonstrates the left external
carotid artery and its major branches to be widely patent.

The left internal carotid artery at the bulb to the cranial skull
base demonstrates an irregular severely narrowed left internal
carotid artery opacifying into the cranial skull base.

More distally there is reconstitution of the caval cavernous segment
in the left internal carotid artery from the branches arising from
the internal maxillary artery probably the artery of the foramen of
[place].

The distal cavernous and the supraclinoid left ICA appear patent.
There is flow noted into the left middle cerebral artery and the
left anterior cerebral artery. However, these two reconstitute only
a partial portion of the these two distributions with the remainder
being supplied from the posterior circulation as described below.

The left vertebral artery origin again demonstrates severe stenosis.
This is the dominant vertebral artery which opacifies to the cranial
skull base. Wide patency is seen of the left vertebrobasilar
junction and the left posterior-inferior cerebellar artery.

The opacified portion of the basilar artery, the posterior cerebral
arteries, the superior cerebellar arteries and the anterior-inferior
cerebellar arteries is intact. The delayed arterial phase
demonstrates retrograde opacification via the P3 leptomeningeal
branches of the left anterior cerebral artery retrogradely, and also
the parietal, cortical and subcortical branches on the left.

Also demonstrated is partial opacification of the right middle
cerebral artery and the right anterior cerebral artery distribution
from P2 segments of the right posterior cerebral artery.

Delayed opacification of the perisylvian branches is noted.

ENDOVASCULAR STENT ASSISTED ANGIOPLASTY OF SEVERE HIGH-GRADE
STENOSIS OF THE DOMINANT LEFT VERTEBRAL ARTERY AT ITS ORIGIN.

A diagnostic JB 1 catheter was then advanced to the distal left
subclavian artery distal to the origin of the costocervical trunk
and exchanged over 0.035 inch 300 cm Rosen exchange guidewire for a
6 French 80 cm BUROK. The guidewire was removed. Good
aspiration was obtained from the hub of the BUROK.
This was then retrieved whereby the tip was just proximal to the
origin of the dominant left vertebral artery at the site of the
severe stenosis.

Over a 0.014 inch Transend EX soft tip micro guidewire, a rapid
transit microcatheter was advanced to the distal cervical left ICA
without difficulty. The guidewire was removed. Good aspiration
obtained from the hub of the microcatheter. This in turn was then
replaced with a 014 inch 300 cm Transend soft tip EX exchange micro
guidewire which had a J configuration at the distal aspect. The wire
was advanced to the horizontal petrous segment. The microcatheter
was removed. A control arteriogram performed through the BUROK
BUROK demonstrated straightening of the previously noted
origin of the left vertebral artery. Measurements were then
performed the vertebral artery in its most normal segment
proximally. The length of the proposed stent was then evaluated.

It was decided to use a 5 mm x 12 mm coronary balloon resolution
ONYX drug-eluting stent.

This was retrogradely purged with heparinized saline infusion.
Thereafter using the rapid exchange technique, the balloon mounted
stent was advanced without difficulty to the site of the severe left
vertebral artery stenosis.

The proximal and the distal landing zones were then defined with the
patient holding his breath.

The stent was then deployed by inflating the balloon with a micro
inflation syringe device via micro tubing to just over 5 mm where it
was maintained for approximately 15 seconds. The balloon was then
deflated and retrieved and removed. A control arteriogram performed
through the BUROK in the left vertebral artery
proximally demonstrated significantly improved caliber and flow
through the stented segment with brisk flow into the posterior
fossa. The left vertebrobasilar junction, the basilar artery, the
posterior cerebral arteries, the superior cerebellar arteries and
the anterior-inferior cerebellar arteries demonstrated opacification
into the capillary and venous phases. No intraluminal filling
defects or occlusions were seen.

Control arteriograms were then performed at 15 and 30 minutes post
stent deployment. These continued to demonstrate excellent
apposition proximally and distally with slight protrusion of the
stent into the left subclavian artery.

No evidence of intraluminal filling defects, or of intra stent
luminal irregularities were seen.

A final control arteriogram performed through the 6 BUROK
BUROK just proximal to the stent after removing the
exchange micro guidewire continued to demonstrate excellent flow
through the entirety of the left vertebrobasilar system without
evidence of internal filling defects or of occlusions.

The delayed arterial phases continued to demonstrate brisk
collateral flow into the anterior and middle cerebral artery
distributions bilaterally as described previously.

The BUROK was then replaced with a 6 French Pinnacle
sheath. This in turn was then removed with the successful
application of a 6 French Angio-Seal closure device.

The patient's ACT was maintained in the region of approximately 200
seconds throughout the procedure.

The right groin appeared soft without evidence of hematoma or
bleeding. Distal pulses remained Dopplerable in the dorsalis pedis,
and the posterior tibial regions bilaterally at the end of
procedure.

Clinically the patient remained asymptomatic without symptoms of
nausea, vomiting visual aberrations or speech difficulties.

He was then returned to the neuro ICU for overnight close
observations, and also to continue on low-dose IV heparin, and blood
pressure monitoring.

His overnight stay was unremarkable.

The following day the patient's IV heparin was stopped and patient
switched to Brilinta 90 mg b.i.d. and 1 baby aspirin.

He was eating normally. Patient ambulated independently. Prior to
discharge, he was advised to maintain adequate hydration and to
continue taking his antiplatelets and to visit with his primary care
physician for routine checkup.

He was advised to refrain from driving for 2 weeks, or stooping,
bending or lifting weights above 10 pounds for a couple of weeks.

Should he develop balance difficulties, vision difficulties,
weakness in his legs, or speech difficulties, he was advised to call
[phone]. Patient expressed an understanding and agreement with the above
management plan.

He was then discharged home under care of a family member.
IMPRESSION: Status post stent assisted angioplasty of dominant left vertebral
artery origin stenosis without event.

PLAN:
Return for follow-up in the clinic in approximately 2 weeks time.

## 2019-07-20 SURGERY — RADIOLOGY WITH ANESTHESIA
Anesthesia: General

## 2019-07-20 MED ORDER — FENTANYL CITRATE (PF) 100 MCG/2ML IJ SOLN
INTRAMUSCULAR | Status: DC | PRN
  Administered 2019-07-20 (×2): 25 ug via INTRAVENOUS

## 2019-07-20 MED ORDER — ONDANSETRON HCL 4 MG/2ML IJ SOLN
INTRAMUSCULAR | Status: DC | PRN
  Administered 2019-07-20: 4 mg via INTRAVENOUS

## 2019-07-20 MED ORDER — IOHEXOL 300 MG/ML  SOLN
150.0000 mL | Freq: Once | INTRAMUSCULAR | Status: AC | PRN
  Administered 2019-07-20: 40 mL via INTRA_ARTERIAL

## 2019-07-20 MED ORDER — ONDANSETRON HCL 4 MG/2ML IJ SOLN: 4.0000 mg | Freq: Once | INTRAMUSCULAR | Status: DC | PRN

## 2019-07-20 MED ORDER — LIDOCAINE HCL (PF) 1 % IJ SOLN
INTRAMUSCULAR | Status: DC | PRN
  Administered 2019-07-20: 10 mL

## 2019-07-20 MED ORDER — ASPIRIN 81 MG PO CHEW
243.0000 mg | CHEWABLE_TABLET | Freq: Once | ORAL | Status: AC
  Administered 2019-07-20: 243 mg via ORAL
  Filled 2019-07-20: qty 3

## 2019-07-20 MED ORDER — ATORVASTATIN CALCIUM 80 MG PO TABS
80.0000 mg | ORAL_TABLET | Freq: Every day | ORAL | Status: DC
  Filled 2019-07-20: qty 1

## 2019-07-20 MED ORDER — LIDOCAINE HCL 1 % IJ SOLN
INTRAMUSCULAR | Status: AC
  Filled 2019-07-20: qty 20

## 2019-07-20 MED ORDER — HEPARIN (PORCINE) 25000 UT/250ML-% IV SOLN: 850.0000 [IU]/h | INTRAVENOUS | Status: AC

## 2019-07-20 MED ORDER — HEPARIN (PORCINE) 25000 UT/250ML-% IV SOLN
750.0000 [IU]/h | INTRAVENOUS | Status: DC
  Administered 2019-07-20: 500 [IU]/h via INTRAVENOUS
  Administered 2019-07-20: 750 [IU]/h via INTRAVENOUS

## 2019-07-20 MED ORDER — SODIUM CHLORIDE 0.9 % IV SOLN
INTRAVENOUS | Status: DC
  Administered 2019-07-20 (×3): via INTRAVENOUS

## 2019-07-20 MED ORDER — SODIUM CHLORIDE 0.9 % IV SOLN
INTRAVENOUS | Status: DC
  Administered 2019-07-20 (×2): via INTRAVENOUS

## 2019-07-20 MED ORDER — OXYCODONE HCL 5 MG PO TABS: 5.0000 mg | ORAL_TABLET | Freq: Once | ORAL | Status: DC | PRN

## 2019-07-20 MED ORDER — CHLORHEXIDINE GLUCONATE CLOTH 2 % EX PADS
6.0000 | MEDICATED_PAD | Freq: Every day | CUTANEOUS | Status: DC
  Administered 2019-07-20: 6 via TOPICAL

## 2019-07-20 MED ORDER — NIMODIPINE 30 MG PO CAPS
0.0000 mg | ORAL_CAPSULE | ORAL | Status: AC
  Administered 2019-07-20: 60 mg via ORAL
  Filled 2019-07-20: qty 2

## 2019-07-20 MED ORDER — ACETAMINOPHEN 325 MG PO TABS: 650.0000 mg | ORAL_TABLET | ORAL | Status: DC | PRN

## 2019-07-20 MED ORDER — ASPIRIN 81 MG PO CHEW
81.0000 mg | CHEWABLE_TABLET | Freq: Every day | ORAL | Status: DC
  Administered 2019-07-21: 81 mg via ORAL
  Filled 2019-07-20: qty 1

## 2019-07-20 MED ORDER — SODIUM CHLORIDE 0.9 % IV BOLUS
250.0000 mL | Freq: Once | INTRAVENOUS | Status: AC
  Administered 2019-07-20: 250 mL via INTRAVENOUS

## 2019-07-20 MED ORDER — VANCOMYCIN HCL IN DEXTROSE 1-5 GM/200ML-% IV SOLN
1000.0000 mg | INTRAVENOUS | Status: AC
  Administered 2019-07-20: 1000 mg via INTRAVENOUS
  Filled 2019-07-20: qty 200

## 2019-07-20 MED ORDER — TICAGRELOR 90 MG PO TABS: 90.0000 mg | ORAL_TABLET | Freq: Two times a day (BID) | ORAL | Status: DC

## 2019-07-20 MED ORDER — IOHEXOL 300 MG/ML  SOLN
150.0000 mL | Freq: Once | INTRAMUSCULAR | Status: AC | PRN
  Administered 2019-07-20: 80 mL via INTRA_ARTERIAL

## 2019-07-20 MED ORDER — MIDAZOLAM HCL 5 MG/5ML IJ SOLN
INTRAMUSCULAR | Status: DC | PRN
  Administered 2019-07-20 (×2): 1 mg via INTRAVENOUS

## 2019-07-20 MED ORDER — FENTANYL CITRATE (PF) 100 MCG/2ML IJ SOLN: 25.0000 ug | INTRAMUSCULAR | Status: DC | PRN

## 2019-07-20 MED ORDER — HEPARIN SODIUM (PORCINE) 1000 UNIT/ML IJ SOLN
INTRAMUSCULAR | Status: DC | PRN
  Administered 2019-07-20: 3000 [IU] via INTRAVENOUS
  Administered 2019-07-20: 500 [IU] via INTRAVENOUS

## 2019-07-20 MED ORDER — NITROGLYCERIN 1 MG/10 ML FOR IR/CATH LAB
INTRA_ARTERIAL | Status: AC
  Filled 2019-07-20: qty 10

## 2019-07-20 MED ORDER — TICAGRELOR 90 MG PO TABS
90.0000 mg | ORAL_TABLET | Freq: Two times a day (BID) | ORAL | Status: DC
  Administered 2019-07-20 – 2019-07-21 (×2): 90 mg via ORAL
  Filled 2019-07-20 (×2): qty 1

## 2019-07-20 MED ORDER — ACETAMINOPHEN 160 MG/5ML PO SOLN: 650.0000 mg | ORAL | Status: DC | PRN

## 2019-07-20 MED ORDER — IOHEXOL 300 MG/ML  SOLN
50.0000 mL | Freq: Once | INTRAMUSCULAR | Status: AC | PRN
  Administered 2019-07-20: 20 mL via INTRA_ARTERIAL

## 2019-07-20 MED ORDER — PHENYLEPHRINE HCL (PRESSORS) 10 MG/ML IV SOLN
INTRAVENOUS | Status: DC | PRN
  Administered 2019-07-20: 80 ug via INTRAVENOUS
  Administered 2019-07-20: 40 ug via INTRAVENOUS

## 2019-07-20 MED ORDER — OXYCODONE HCL 5 MG/5ML PO SOLN: 5.0000 mg | Freq: Once | ORAL | Status: DC | PRN

## 2019-07-20 MED ORDER — CLEVIDIPINE BUTYRATE 0.5 MG/ML IV EMUL: 0.0000 mg/h | INTRAVENOUS | Status: AC

## 2019-07-20 MED ORDER — HEPARIN (PORCINE) 25000 UT/250ML-% IV SOLN: 750.0000 [IU]/h | INTRAVENOUS | Status: DC

## 2019-07-20 MED ORDER — ASPIRIN EC 325 MG PO TBEC
325.0000 mg | DELAYED_RELEASE_TABLET | ORAL | Status: DC
  Filled 2019-07-20: qty 1

## 2019-07-20 MED ORDER — ASPIRIN 81 MG PO CHEW: 81.0000 mg | CHEWABLE_TABLET | Freq: Every day | ORAL | Status: DC

## 2019-07-20 MED ORDER — ACETAMINOPHEN 650 MG RE SUPP: 650.0000 mg | RECTAL | Status: DC | PRN

## 2019-07-20 MED ORDER — HEPARIN (PORCINE) 25000 UT/250ML-% IV SOLN
INTRAVENOUS | Status: AC
  Administered 2019-07-20: 500 [IU]/h via INTRAVENOUS
  Filled 2019-07-20: qty 250

## 2019-07-20 NOTE — Progress Notes (Signed)
Patient belongs placed in closet upon arrival:  Pants, belt, shirt, underwear, shoes, socks Aspirin Atorvastatin (2) Brilinta (3) Keys Money Clip- $22 cash, debit card, sheetz card, driver's license, insurance cards

## 2019-07-20 NOTE — Procedures (Signed)
S/P 4 vessel cerebral arteriogram  RT CFA approach. Findings. 1.S/P stent assisted angioplasty of   severe stenosis at origin of dominant Lt [place] . 2. Occluded L:t  ICA prox ,and severe stenosis   of RT ICA prox. Marland Kitchen S.Ignazio Kincaid MD

## 2019-07-20 NOTE — Anesthesia Preprocedure Evaluation (Addendum)
Anesthesia Evaluation  Patient identified by MRN, date of birth, ID band Patient awake    Reviewed: Allergy & Precautions, NPO status , Patient's Chart, lab work & pertinent test results  Airway Mallampati: II  TM Distance: >3 FB Neck ROM: Full    Dental  (+) Teeth Intact   Pulmonary neg pulmonary ROS, former smoker,    Pulmonary exam normal        Cardiovascular hypertension, Normal cardiovascular exam     Neuro/Psych S/p R vertebral artery stent 05/12/19, now for arteriogram with possible R ICA and/or L vertebral angioplasty stent/placement CVA (May 2020), Residual Symptoms    GI/Hepatic negative GI ROS, Neg liver ROS,   Endo/Other  negative endocrine ROS  Renal/GU negative Renal ROS     Musculoskeletal negative musculoskeletal ROS (+)   Abdominal   Peds  Hematology  (+) Blood dyscrasia (Brilinta), ,   Anesthesia Other Findings Echo 05/05/19: EF 60-65%, normal valves  Reproductive/Obstetrics                            Anesthesia Physical  Anesthesia Plan  ASA: III  Anesthesia Plan: MAC   Post-op Pain Management:    Induction: Intravenous  PONV Risk Score and Plan: 2 and Treatment may vary due to age or medical condition, Propofol infusion and TIVA  Airway Management Planned: Nasal Cannula, Simple Face Mask and Natural Airway  Additional Equipment:   Intra-op Plan:   Post-operative Plan:   Informed Consent: I have reviewed the patients History and Physical, chart, labs and discussed the procedure including the risks, benefits and alternatives for the proposed anesthesia with the patient or authorized representative who has indicated his/her understanding and acceptance.     Dental advisory given  Plan Discussed with:   Anesthesia Plan Comments: (Plan to start MAC, discussed possible need for conversion to GETA with patient)      Anesthesia Quick Evaluation

## 2019-07-20 NOTE — Progress Notes (Signed)
Patient ID: Jose Kaufman, male   DOB: 15-Nov-1951, 68 y.o.   MRN: [id number] Post procedure.  Awake Ox 3 . Denies any H/As.N/V or vision or speech difficulties. Pupils 2 to 3 mm RT = Lt No facial asymmetry. Moves all 4 s equally. RT Groin soft, Distal pulses both DPs and PT palpable unchanged. S.Noell Shular MD

## 2019-07-20 NOTE — H&P (Signed)
Chief Complaint: Patient was seen in consultation today for right internal carotid angioplasty/stent vs left vertebral artery angioplasty/stent at the request of Dr Royal Hawthorn  Supervising Physician: [name]  Patient Status: [facility] - Out-pt  History of Present Illness: Jose Kaufman is a 68 y.o. male   Known to [facility] R Vertebral artery angioplasty/stent 05/12/19 Was planned for R ICA angioplasty/stent 06/05/19 But pt developed hematuria after ureter cath placement prior to procedure. Procedure stopped and rescheduled to today  Plan now for R ICA vs Left vertebral artery angioplasty/stent placement in IR- Dr Estanislado Pandy  Pt feeling well-- still has occasional dizzy spells Denies N/V Denies speech or vision changes Denies weakness/tingling   Past Medical History:  Diagnosis Date   Abnormality of gait 10/15/2016   Arthritis    Dysrhythmia    hx of   Essential hypertension 03/14/2017   PVCs (premature ventricular contractions) 03/14/2017   Skin cancer    Skin   Stroke ([place])    Mini strokes . 06/25/2019  Expressive aphasia - improving   Wears glasses     Past Surgical History:  Procedure Laterality Date   COLONOSCOPY     IR ANGIO INTRA EXTRACRAN SEL COM CAROTID INNOMINATE BILAT MOD SED  05/07/2019   IR ANGIO VERTEBRAL SEL SUBCLAVIAN INNOMINATE BILAT MOD SED  05/07/2019   IR PATIENT EVAL TECH 0-60 MINS  06/08/2019   IR TRANSCATH EXCRAN VERT OR CAR A STENT  05/12/2019   IR US GUIDE VASC ACCESS RIGHT  05/12/2019   RADIOLOGY WITH ANESTHESIA N/A 05/08/2019   Procedure: RADIOLOGY WITH ANESTHESIA;  Surgeon: [name], MD;  Location: [place];  Service: Radiology;  Laterality: N/A;   RADIOLOGY WITH ANESTHESIA N/A 05/12/2019   Procedure: carotid stenting;  Surgeon: [name], MD;  Location: [place];  Service: Radiology;  Laterality: N/A;   RADIOLOGY WITH ANESTHESIA N/A 06/08/2019   Procedure: STENT PLACEMENT;  Surgeon: [name], MD;  Location: [place];   Service: Radiology;  Laterality: N/A;   SKIN CANCER EXCISION     TOTAL HIP ARTHROPLASTY Right 03/22/2017   Procedure: RIGHT TOTAL HIP ARTHROPLASTY ANTERIOR APPROACH;  Surgeon: Mcarthur Rossetti, MD;  Location: [facility];  Service: Orthopedics;  Laterality: Right;   TOTAL HIP ARTHROPLASTY Left 08/23/2017   Procedure: LEFT TOTAL HIP ARTHROPLASTY ANTERIOR APPROACH;  Surgeon: Mcarthur Rossetti, MD;  Location: [facility];  Service: Orthopedics;  Laterality: Left;   WISDOM TOOTH EXTRACTION      Allergies: Penicillins  Medications: Prior to Admission medications   Medication Sig Start Date End Date Taking? Authorizing Provider  aspirin EC 81 MG tablet Take 81 mg by mouth daily.   Yes [provider]  atorvastatin (LIPITOR) 80 MG tablet Take 1 tablet (80 mg total) by mouth daily at 6 PM. 05/22/19  Yes Love, Ivan Anchors, PA-C  ticagrelor (BRILINTA) 90 MG TABS tablet Take 1 tablet (90 mg total) by mouth 2 (two) times daily. 05/22/19  Yes Love, Ivan Anchors, PA-C     Family History  Problem Relation Age of Onset   Leukemia Mother    Pneumonia Father     Social History   Socioeconomic History   Marital status: Divorced    Spouse name: Not on file   Number of children: 2   Years of education: 12   Highest education level: Not on file  Occupational History   Occupation: [employer]   Financial resource strain: Not on file   Food insecurity  Worry: Not on file    Inability: Not on file   Transportation needs    Medical: Not on file    Non-medical: Not on file  Tobacco Use   Smoking status: Former Smoker    Types: Cigarettes    Quit date: 05/20/1992    Years since quitting: 27.1   Smokeless tobacco: Never Used  Substance and Sexual Activity   Alcohol use: Not Currently   Drug use: No   Sexual activity: Yes  Lifestyle   Physical activity    Days per week: Not on file    Minutes per session: Not on file   Stress: Not on file    Relationships   Social connections    Talks on phone: Not on file    Gets together: Not on file    Attends religious service: Not on file    Active member of club or organization: Not on file    Attends meetings of clubs or organizations: Not on file    Relationship status: Not on file  Other Topics Concern   Not on file  Social History Narrative   Pt lives at home, divorced   Right-handed   Caffeine: soda daily    Review of Systems: A 12 point ROS discussed and pertinent positives are indicated in the HPI above.  All other systems are negative.  Review of Systems  Constitutional: Negative for activity change, fatigue, fever and unexpected weight change.  HENT: Negative for tinnitus and trouble swallowing.   Eyes: Negative for visual disturbance.  Respiratory: Negative for cough and shortness of breath.   Cardiovascular: Negative for chest pain and leg swelling.  Gastrointestinal: Negative for abdominal pain, diarrhea, nausea and vomiting.  Genitourinary: Negative for dysuria.  Musculoskeletal: Negative for back pain and gait problem.  Neurological: Positive for dizziness. Negative for tremors, seizures, syncope, facial asymmetry, speech difficulty, weakness, light-headedness, numbness and headaches.  Psychiatric/Behavioral: Negative for behavioral problems and confusion.    Vital Signs: BP (!) 150/92    Pulse 76    Temp 98.6 F (37 C)    Resp 17    Ht 5\' 8"  (1.727 m)    Wt 160 lb (72.6 kg)    SpO2 99%    BMI 24.33 kg/m   Physical Exam Vitals signs reviewed.  Constitutional:      Appearance: Normal appearance.  HENT:     Head: Atraumatic.     Mouth/Throat:     Mouth: Mucous membranes are moist.  Eyes:     Extraocular Movements: Extraocular movements intact.  Neck:     Musculoskeletal: Normal range of motion.  Cardiovascular:     Rate and Rhythm: Normal rate and regular rhythm.     Heart sounds: Normal heart sounds.  Pulmonary:     Effort: Pulmonary effort is  normal.     Breath sounds: Normal breath sounds.  Abdominal:     General: Bowel sounds are normal. There is no distension.     Tenderness: There is no abdominal tenderness.  Musculoskeletal: Normal range of motion.  Skin:    General: Skin is warm and dry.  Neurological:     Mental Status: He is alert and oriented to person, place, and time.  Psychiatric:        Mood and Affect: Mood normal.        Behavior: Behavior normal.        Thought Content: Thought content normal.        Judgment: Judgment normal.  Imaging: No results found.  Labs:  CBC: Recent Labs    05/14/19 1501 05/15/19 0640 06/08/19 0549 07/20/19 0544  WBC 8.4 8.9 7.0 7.8  HGB 13.8 14.5 13.9 16.3  HCT 40.2 42.4 42.2 50.0  PLT 218 225 193 196    COAGS: Recent Labs    05/05/19 0825 06/08/19 0549  INR 1.1 1.2  APTT 34 35    BMP: Recent Labs    05/08/19 0459 05/13/19 0640 05/14/19 1501 06/08/19 0549  NA 140 138 139 140  K 4.0 3.7 3.5 3.5  CL 109 111 107 112*  CO2 23 18* 24 21*  GLUCOSE 107* 91 97 103*  BUN 21 15 16 15   CALCIUM 8.7* 8.3* 8.9 8.7*  CREATININE 1.28* 1.12 1.40* 1.14  GFRNONAA 58* >60 52* >60  GFRAA >60 >60 >60 >60    LIVER FUNCTION TESTS: Recent Labs    05/05/19 0825 05/14/19 1501  BILITOT 0.8 0.8  AST 19 37  ALT 20 44  ALKPHOS 74 79  PROT 8.0 6.0*  ALBUMIN 4.7 3.4*    TUMOR MARKERS: No results for input(s): AFPTM, CEA, CA199, CHROMGRNA in the last 8760 hours.  Assessment and Plan:  R Internal carotid artery stenosis Left vertebral artery stenosis Scheduled for cerebral arteriogram with possible angioplasty/stent placement Risks and benefits of cerebral angiogram with intervention were discussed with the patient including, but not limited to bleeding, infection, vascular injury, contrast induced renal failure, stroke or even death.  This interventional procedure involves the use of X-rays and because of the nature of the planned procedure, it is possible  that we will have prolonged use of X-ray fluoroscopy.  Potential radiation risks to you include (but are not limited to) the following: - A slightly elevated risk for cancer  several years later in life. This risk is typically less than 0.5% percent. This risk is low in comparison to the normal incidence of human cancer, which is 33% for women and 50% for men according to the [place]. - Radiation induced injury can include skin redness, resembling a rash, tissue breakdown / ulcers and hair loss (which can be temporary or permanent).   The likelihood of either of these occurring depends on the difficulty of the procedure and whether you are sensitive to radiation due to previous procedures, disease, or genetic conditions.   IF your procedure requires a prolonged use of radiation, you will be notified and given written instructions for further action.  It is your responsibility to monitor the irradiated area for the 2 weeks following the procedure and to notify your physician if you are concerned that you have suffered a radiation induced injury.    All of the patient's questions were answered, patient is agreeable to proceed.  Consent signed and in chart.  Pt is aware if intervention US performed- he will be admitted overnight to Neuro ICU. He is agreeable to proceed  Thank you for this interesting consult.  I greatly enjoyed meeting [facility] and look forward to participating in their care.  A copy of this report was sent to the requesting provider on this date.  Electronically Signed: Lavonia Drafts, PA-C 07/20/2019, 7:11 AM   I spent a total of    25 Minutes in face to face in clinical consultation, greater than 50% of which was counseling/coordinating care for cerebral arteriogram with possible angioplasty/stent placement

## 2019-07-20 NOTE — Progress Notes (Signed)
Upon arrival, R groin site level 1, new bloody drainage marked @ 1230.   Continued to have consistent slow ooze from R groin site, pressure held for approx 2-3 and new VPad/dressing changed @ 1323 Called to inform MD [name] of continuous oozing, verbal order received from MD [name] to this RN to turn heparin off and obtain an istat ACT.  Heparin turned off @ 1436 iStat ACT result- 98 sec @ 1520, verbal order given to this RN to restart heparin at 1630 Pressure held on R groin 1521-1535 Bleeding stopped, new Vpad/dressing changed @ 1540 Heparin restarted @ 1643

## 2019-07-20 NOTE — Transfer of Care (Signed)
Immediate Anesthesia Transfer of Care Note  Patient: Jose Kaufman  Procedure(s) Performed: RADIOLOGY WITH ANESTHESIA STENTING (N/A )  Patient Location: PACU  Anesthesia Type:MAC  Level of Consciousness: awake, alert  and oriented  Airway & Oxygen Therapy: Patient Spontanous Breathing  Post-op Assessment: Report given to RN, Post -op Vital signs reviewed and stable and Patient moving all extremities  Post vital signs: Reviewed and stable  Last Vitals:  Vitals Value Taken Time  BP 120/77 07/20/19 1123  Temp    Pulse 65 07/20/19 1128  Resp 14 07/20/19 1128  SpO2 98 % 07/20/19 1128  Vitals shown include unvalidated device data.  Last Pain:  Vitals:   07/20/19 0655  PainSc: 0-No pain      Patients Stated Pain Goal: 0 (59/29/24 4628)  Complications: No apparent anesthesia complications

## 2019-07-20 NOTE — Sedation Documentation (Signed)
Patient under the care of anesthesia 

## 2019-07-20 NOTE — Progress Notes (Signed)
Patient ID: Jose Kaufman, male   DOB: 1951-07-19, 68 y.o.   MRN: [id number] INR   Patient admitted for  diagnostic arteriogram and revascularization of either RT ICA or LT VA origin as per previous discussions. Clinically asymptomatic since previous visit.No focal neuro deficit.  Risks of stroke of 1 to 2 % with worsening neuro function ,disability and death reviewed.. Patient expressed understanding and consented to proceed.  S.Griffin Dewilde  MD

## 2019-07-20 NOTE — Progress Notes (Addendum)
ANTICOAGULATION CONSULT NOTE - Consult  Pharmacy Consult for Heparin Indication: s/p IR  Allergies  Allergen Reactions  . Penicillins Other (See Comments)    Childhood reaction. Has patient had a PCN reaction causing immediate rash, facial/tongue/throat swelling, SOB or lightheadedness with hypotension: unsure Has patient had a PCN reaction causing severe rash involving mucus membranes or skin necrosis: unsure Has patient had a PCN reaction that required hospitalization:No Has patient had a PCN reaction occurring within the last 10 years:No If all of the above answers are "NO", then may proceed with Cephalosporin use    Patient Measurements: Height: 5\' 8"  (172.7 cm) Weight: 160 lb (72.6 kg) IBW/kg (Calculated) : 68.4 Heparin Dosing Weight:  72.6 kg  Vital Signs: Temp: 97.6 F (36.4 C) (08/10 1155) BP: 119/76 (08/10 1300) Pulse Rate: 63 (08/10 1300)  Labs: Recent Labs    07/20/19 0544  HGB 16.3  HCT 50.0  PLT 196  LABPROT 13.6  INR 1.1  CREATININE 1.09    Estimated Creatinine Clearance: 65.4 mL/min (by C-G formula based on SCr of 1.09 mg/dL).  Assessment: CC/HPI: admitted for diagnostic arteriogram and revascularization  PMH: R Vertebral artery angioplasty/stent 05/12/19  Anticoag: Was planned for R ICA angioplasty/stent 06/05/19 But pt developed hematuria after ureter cath placement prior to procedure. Rescheduled for 8/10. IV heparin post-IR at 750 units/hr. Baseine BC WNL. Heparin level now <0.1 after holding earlier for some oozing around groin site.  No further bleeding noted   Goal of Therapy:  Heparin level 0.1-0.25 units/ml Monitor platelets by anticoagulation protocol: Yes   Plan:  Increase IV heparin from 850 units/hr Heparin off in am   Thanks for allowing pharmacy to be a part of this patient's care.  Excell Seltzer, PharmD Clinical Pharmacist

## 2019-07-20 NOTE — Progress Notes (Signed)
  Post procedure afternoon round note.  S/P 4 vessel cerebral arteriogram  RT CFA approach. Findings. 1.S/P stent assisted angioplasty of   severe stenosis at origin of dominant Lt [place] . 2. Occluded L:t  ICA prox ,and severe stenosis   of RT ICA prox  Patient seen. Doing ok  Awake and alert NAD Tongue midline PERRL Grip 5/5 bilaterally No facial droop Speech ok Groin stick ok, no bleeding/hematoma. Had bleed a little earlier, but no bleeding at this time.  Continue Heparin drip.  Probably D/C tomorrow if continues to do well.  Aliha Diedrich S Yarethzy Croak PA-C 07/20/2019 4:15 PM

## 2019-07-21 ENCOUNTER — Encounter (HOSPITAL_COMMUNITY): Payer: Self-pay | Admitting: Interventional Radiology

## 2019-07-21 DIAGNOSIS — I6509 Occlusion and stenosis of unspecified vertebral artery: Secondary | ICD-10-CM | POA: Diagnosis present

## 2019-07-21 DIAGNOSIS — I6502 Occlusion and stenosis of left vertebral artery: Secondary | ICD-10-CM | POA: Diagnosis not present

## 2019-07-21 LAB — CBC WITH DIFFERENTIAL/PLATELET
Abs Immature Granulocytes: 0.02 10*3/uL (ref 0.00–0.07)
Basophils Absolute: 0.1 10*3/uL (ref 0.0–0.1)
Basophils Relative: 1 %
Eosinophils Absolute: 0.4 10*3/uL (ref 0.0–0.5)
Eosinophils Relative: 5 %
HCT: 41.4 % (ref 39.0–52.0)
Hemoglobin: 13.5 g/dL (ref 13.0–17.0)
Immature Granulocytes: 0 %
Lymphocytes Relative: 12 %
Lymphs Abs: 0.9 10*3/uL (ref 0.7–4.0)
MCH: 30.3 pg (ref 26.0–34.0)
MCHC: 32.6 g/dL (ref 30.0–36.0)
MCV: 93 fL (ref 80.0–100.0)
Monocytes Absolute: 0.6 10*3/uL (ref 0.1–1.0)
Monocytes Relative: 7 %
Neutro Abs: 5.7 10*3/uL (ref 1.7–7.7)
Neutrophils Relative %: 75 %
Platelets: 139 10*3/uL — ABNORMAL LOW (ref 150–400)
RBC: 4.45 MIL/uL (ref 4.22–5.81)
RDW: 13.7 % (ref 11.5–15.5)
WBC: 7.7 10*3/uL (ref 4.0–10.5)
nRBC: 0 % (ref 0.0–0.2)

## 2019-07-21 LAB — BASIC METABOLIC PANEL
Anion gap: 10 (ref 5–15)
BUN: 10 mg/dL (ref 8–23)
CO2: 19 mmol/L — ABNORMAL LOW (ref 22–32)
Calcium: 8.2 mg/dL — ABNORMAL LOW (ref 8.9–10.3)
Chloride: 108 mmol/L (ref 98–111)
Creatinine, Ser: 0.97 mg/dL (ref 0.61–1.24)
GFR calc Af Amer: >60 mL/min (ref >60)
GFR calc non Af Amer: >60 mL/min (ref >60)
Glucose, Bld: 90 mg/dL (ref 70–99)
Potassium: 3.8 mmol/L (ref 3.5–5.1)
Sodium: 137 mmol/L (ref 135–145)

## 2019-07-21 LAB — HEPARIN LEVEL (UNFRACTIONATED): Heparin Unfractionated: 0.19 IU/mL — ABNORMAL LOW (ref 0.30–0.70)

## 2019-07-21 NOTE — Plan of Care (Signed)

## 2019-07-21 NOTE — Discharge Summary (Signed)
Patient ID: Jose Kaufman MRN: [id number] DOB/AGE: Mar 31, 1951 68 y.o.  Admit date: 07/20/2019 Discharge date: 07/21/2019  Supervising Physician: [name]  Patient Status: [facility] - In-pt  Admission Diagnoses: Vertebral artery narrowing, left  Discharge Diagnoses:  Active Problems:   Vertebral artery narrowing, left   Discharged Condition: stable  Hospital Course:  Patient presented to [facility]20/2019 for an image-guided cerebral arteriogram with revascularization of left vertebral artery stenosis using stent assisted angioplasty via right femoral approach by Dr. Estanislado Pandy. Procedure occurred without major complications and patient was transferred to neuro ICU in stable condition (VSS, right groin incision stable) for overnight observation. Upon arrival to neuro ICU, patient noted to have oozing from right groin site. Dr. Estanislado Pandy was made aware, Heparin held at 1436, and manual pressure with V-pad held to right groin until hemostasis was achieved at approximately 1540. Heparin was restarted at 1643. No additional events occurred overnight.  Patient awake and alert laying in bed. Complains of residual slurred speech, improved since CVA 04/2019. Denies headache, weakness, numbness/tingling, dizziness, vision changes, hearing changes, or tinnitus. Right groin incision stable. Plan to discharge home today and follow-up with Dr. Estanislado Pandy in clinic 2 weeks after discharge.   Consults: None  Significant Diagnostic Studies: No results found.  Treatments: Endovascular revascularization of left vertebral artery stenosis using stent assisted angioplasty.  Discharge Exam: Blood pressure (!) 134/92, pulse 71, temperature 98.8 F (37.1 C), temperature source Oral, resp. rate 13, height 5\' 8"  (1.727 m), weight 160 lb (72.6 kg), SpO2 98 %. Physical Exam Vitals signs and nursing note reviewed.  Constitutional:      General: He is not in acute distress.    Appearance: Normal appearance.   Cardiovascular:     Rate and Rhythm: Normal rate and regular rhythm.     Heart sounds: Normal heart sounds. No murmur.  Pulmonary:     Effort: Pulmonary effort is normal. No respiratory distress.     Breath sounds: Normal breath sounds. No wheezing.  Skin:    General: Skin is warm and dry.     Comments: Right groin incision soft without active bleeding or hematoma.  Neurological:     Mental Status: He is alert.     Comments: Alert, awake, and oriented x3. Speech mildly dysarthric, comprehension intact. PERRL bilaterally. EOMs intact bilaterally without nystagmus or subjective diplopia. No facial asymmetry. Tongue midline. Motor power symmetric proportional to effort. No pronator drift. Fine motor and coordination intact and symmetric. Distal pulses 1+ bilaterally.  Psychiatric:        Mood and Affect: Mood normal.        Behavior: Behavior normal.        Thought Content: Thought content normal.        Judgment: Judgment normal.     Disposition: Discharge disposition: 01-Home or Self Care       Discharge Instructions    Call MD for:  difficulty breathing, headache or visual disturbances   Complete by: As directed    Call MD for:  extreme fatigue   Complete by: As directed    Call MD for:  hives   Complete by: As directed    Call MD for:  persistant dizziness or light-headedness   Complete by: As directed    Call MD for:  persistant nausea and vomiting   Complete by: As directed    Call MD for:  redness, tenderness, or signs of infection (pain, swelling, redness, odor or green/yellow discharge around incision site)   Complete  by: As directed    Call MD for:  severe uncontrolled pain   Complete by: As directed    Call MD for:  temperature >100.4   Complete by: As directed    Diet - low sodium heart healthy   Complete by: As directed    Discharge instructions   Complete by: As directed    Continue taking Brilinta 90 mg twice daily. Continue taking Aspirin 81 mg  once daily. No bending, stooping, or lifting more than 10 pounds for 2 weeks. No driving self for 2 weeks. Stay hydrated by drinking plenty of water.   Increase activity slowly   Complete by: As directed    No dressing needed   Complete by: As directed      Allergies as of 07/21/2019      Reactions   Penicillins Other (See Comments)   Childhood reaction. Has patient had a PCN reaction causing immediate rash, facial/tongue/throat swelling, SOB or lightheadedness with hypotension: unsure Has patient had a PCN reaction causing severe rash involving mucus membranes or skin necrosis: unsure Has patient had a PCN reaction that required hospitalization:No Has patient had a PCN reaction occurring within the last 10 years:No If all of the above answers are "NO", then may proceed with Cephalosporin use      Medication List    TAKE these medications   aspirin EC 81 MG tablet Take 81 mg by mouth daily.   atorvastatin 80 MG tablet Commonly known as: LIPITOR Take 1 tablet (80 mg total) by mouth daily at 6 PM.   ticagrelor 90 MG Tabs tablet Commonly known as: BRILINTA Take 1 tablet (90 mg total) by mouth 2 (two) times daily.      Follow-up Information    [name], MD Follow up in 2 week(s).   Specialties: Interventional Radiology, Radiology Why: Please follow-up with Dr. Estanislado Pandy in clinic 2 weeks after discharge. Our office will call you to set up this appointment. Contact information: [place] [address] [phone]        Kathyrn Lass, MD Follow up in 1 month(s).   Specialty: Family Medicine Why: Please follow-up with your PCP within 1 month of discharge for your annual visit. Contact information: [name] [address] [phone]            Electronically Signed: Earley Abide, PA-C 07/21/2019, 10:07 AM   I have spent Less Than 30 Minutes discharging [name].

## 2019-07-21 NOTE — Anesthesia Postprocedure Evaluation (Signed)
Anesthesia Post Note  Patient: Jose Kaufman  Procedure(s) Performed: RADIOLOGY WITH ANESTHESIA STENTING (N/A )     Patient location during evaluation: PACU Anesthesia Type: MAC Level of consciousness: awake and alert Pain management: pain level controlled Vital Signs Assessment: post-procedure vital signs reviewed and stable Respiratory status: spontaneous breathing, nonlabored ventilation and respiratory function stable Cardiovascular status: blood pressure returned to baseline and stable Postop Assessment: no apparent nausea or vomiting Anesthetic complications: no    Last Vitals:  Vitals:   07/21/19 0700 07/21/19 0800  BP: (!) 134/92   Pulse: 71   Resp: 13   Temp:  37.1 C  SpO2: 98%     Last Pain:  Vitals:   07/21/19 0800  TempSrc: Oral  PainSc: 0-No pain   Pain Goal: Patients Stated Pain Goal: 0 (07/20/19 0655)  LLE Motor Response: Purposeful movement (07/21/19 0800) LLE Sensation: Full sensation (07/21/19 0800) RLE Motor Response: Purposeful movement (07/21/19 0800) RLE Sensation: Full sensation (07/21/19 0800)        Lidia Collum

## 2019-07-21 NOTE — Progress Notes (Signed)
Pt discharged home with brother. A/Ox4, vital signs within normal limits.

## 2019-07-22 ENCOUNTER — Telehealth: Payer: Self-pay | Admitting: Neurology

## 2019-07-22 ENCOUNTER — Encounter (HOSPITAL_COMMUNITY): Payer: Self-pay | Admitting: Interventional Radiology

## 2019-07-22 NOTE — Telephone Encounter (Signed)
He can start PT/OT

## 2019-07-22 NOTE — Telephone Encounter (Signed)
Pt said he just had surgery on Monday and he is to start Physical Therapy on Monday the 17th but he needs a release from Dr Leonie Man 1st.  Please call to discuss

## 2019-07-23 ENCOUNTER — Other Ambulatory Visit: Payer: Self-pay

## 2019-07-23 DIAGNOSIS — I6389 Other cerebral infarction: Secondary | ICD-10-CM

## 2019-07-23 NOTE — Telephone Encounter (Signed)
I called outpatient rehab.They stated pt need new orders to resume PT. New orders put in for PT.

## 2019-07-23 NOTE — Telephone Encounter (Signed)
Pt called again and stated that when he called the rehab facility they informed him that he is needing resume forms faxed to them for them to continue to give him PT/OT. Please advise.

## 2019-07-23 NOTE — Telephone Encounter (Signed)
I called pt that orders are in to resume physical therapy. Pt verbalized understanding.

## 2019-07-27 ENCOUNTER — Other Ambulatory Visit: Payer: Self-pay

## 2019-07-27 ENCOUNTER — Telehealth: Payer: Self-pay

## 2019-07-27 ENCOUNTER — Ambulatory Visit: Payer: Commercial Managed Care - PPO | Admitting: Occupational Therapy

## 2019-07-27 ENCOUNTER — Ambulatory Visit: Payer: Commercial Managed Care - PPO | Admitting: Physical Therapy

## 2019-07-27 DIAGNOSIS — I69318 Other symptoms and signs involving cognitive functions following cerebral infarction: Secondary | ICD-10-CM

## 2019-07-27 DIAGNOSIS — R2689 Other abnormalities of gait and mobility: Secondary | ICD-10-CM | POA: Diagnosis not present

## 2019-07-27 DIAGNOSIS — I6389 Other cerebral infarction: Secondary | ICD-10-CM

## 2019-07-27 DIAGNOSIS — M25552 Pain in left hip: Secondary | ICD-10-CM

## 2019-07-27 DIAGNOSIS — R41842 Visuospatial deficit: Secondary | ICD-10-CM

## 2019-07-27 DIAGNOSIS — R41840 Attention and concentration deficit: Secondary | ICD-10-CM

## 2019-07-27 DIAGNOSIS — R269 Unspecified abnormalities of gait and mobility: Secondary | ICD-10-CM

## 2019-07-27 DIAGNOSIS — M25551 Pain in right hip: Secondary | ICD-10-CM

## 2019-07-27 NOTE — Telephone Encounter (Signed)
Hi Dr. Leonie Man,   We received resume orders for P.T. following Jose Kaufman's surgery, but we need resume orders for O.T. and speech as well. Please send resume orders for O.T. and speech therapy.  Thank you Redmond Baseman, OTR/L

## 2019-07-27 NOTE — Telephone Encounter (Signed)
Orders in for speech and occupational therapy.

## 2019-07-27 NOTE — Therapy (Signed)
[facility] [address] [place], [name], [address] Phone: [phone]   Fax:  [phone]  Physical Therapy Treatment  Patient Details  Name: Jose Kaufman MRN: [id number] Date of Birth: [birth year] Referring Provider (PT): Leonie Man   Encounter Date: 07/27/2019  PT End of Session - 07/27/19 0930    Visit Number  3    Number of Visits  9    Date for PT Re-Evaluation  09/11/19    Authorization Type  UME; 30 VL (PT/OT/aquatic counts as one visit on the same day HARD MAX); speech is separate 30 VL    Authorization - Visit Number  3   As of 07/13/2019, OT had 4 visits; PT initial visit   Authorization - Number of Visits  15    PT Start Time  0845    PT Stop Time  0925    PT Time Calculation (min)  40 min    Activity Tolerance  Patient tolerated treatment well    Behavior During Therapy  [facility] for tasks assessed/performed       Past Medical History:  Diagnosis Date  . Abnormality of gait 10/15/2016  . Arthritis   . Dysrhythmia    hx of  . Essential hypertension 03/14/2017  . PVCs (premature ventricular contractions) 03/14/2017  . Skin cancer    Skin  . Stroke [facility])    Mini strokes . 06/25/2019  Expressive aphasia - improving  . Wears glasses     Past Surgical History:  Procedure Laterality Date  . COLONOSCOPY    . IR ANGIO INTRA EXTRACRAN SEL COM CAROTID INNOMINATE BILAT MOD SED  05/07/2019  . IR ANGIO INTRA EXTRACRAN SEL COM CAROTID INNOMINATE BILAT MOD SED  07/20/2019  . IR ANGIO VERTEBRAL SEL SUBCLAVIAN INNOMINATE BILAT MOD SED  05/07/2019  . IR ANGIO VERTEBRAL SEL SUBCLAVIAN INNOMINATE UNI R MOD SED  07/20/2019  . IR PATIENT EVAL TECH 0-60 MINS  06/08/2019  . IR TRANSCATH EXCRAN VERT OR CAR A STENT  05/12/2019  . IR TRANSCATH EXCRAN VERT OR CAR A STENT  07/20/2019  . IR US GUIDE VASC ACCESS RIGHT  05/12/2019  . RADIOLOGY WITH ANESTHESIA N/A 05/08/2019   Procedure: RADIOLOGY WITH ANESTHESIA;  Surgeon: [name], MD;   Location: [place];  Service: Radiology;  Laterality: N/A;  . RADIOLOGY WITH ANESTHESIA N/A 05/12/2019   Procedure: carotid stenting;  Surgeon: [name], MD;  Location: [place];  Service: Radiology;  Laterality: N/A;  . RADIOLOGY WITH ANESTHESIA N/A 06/08/2019   Procedure: STENT PLACEMENT;  Surgeon: [name], MD;  Location: [place];  Service: Radiology;  Laterality: N/A;  . RADIOLOGY WITH ANESTHESIA N/A 07/20/2019   Procedure: RADIOLOGY WITH ANESTHESIA STENTING;  Surgeon: [name], MD;  Location: [place];  Service: Radiology;  Laterality: N/A;  . SKIN CANCER EXCISION    . TOTAL HIP ARTHROPLASTY Right 03/22/2017   Procedure: RIGHT TOTAL HIP ARTHROPLASTY ANTERIOR APPROACH;  Surgeon: Mcarthur Rossetti, MD;  Location: [facility];  Service: Orthopedics;  Laterality: Right;  . TOTAL HIP ARTHROPLASTY Left 08/23/2017   Procedure: LEFT TOTAL HIP ARTHROPLASTY ANTERIOR APPROACH;  Surgeon: Mcarthur Rossetti, MD;  Location: [facility];  Service: Orthopedics;  Laterality: Left;  . WISDOM TOOTH EXTRACTION      There were no vitals filed for this visit.  Subjective Assessment - 07/27/19 0851    Subjective  Relays he is a little sore in his groin from having stents placed    Pertinent History  Hx  of bilateral hip replacement 2018, hx of OA    Patient Stated Goals  Pt's goal for therapy is to walk better.                       [facility] Adult PT Treatment/Exercise - 07/27/19 0001      Ambulation/Gait   Gait Comments  4 laps warm up (440 ft) supervision      High Level Balance   High Level Balance Comments  in bars, gait with head turns up/down then head turns lateral, then  tandem walk (did need one UE support for tandem but progressed to no support on last rep), all others intermit UE support as needed, all down and back X 3 ea, hurdles sideways and fwd needed one UE support with these and constant cues to take bigger steps so he can clear his trial leg      Exercises    Exercises  Knee/Hip      Knee/Hip Exercises: Standing   Other Standing Knee Exercises  step ups fwd, lateral, X 15 each on Rt leg,      Knee/Hip Exercises: Seated   Sit to Sand  2 sets;10 reps;without UE support      Knee/Hip Exercises: Supine   Bridges  20 reps    Straight Leg Raises  Both;15 reps      Knee/Hip Exercises: Sidelying   Hip ABduction  Right;15 reps      Manual Therapy   Manual therapy comments  passive ROM bilat hips for flexion, IR, ER, manual stretching for hamstrings and glutes bilat                  PT Long Term Goals - 07/13/19 1136      PT LONG TERM GOAL #1   Title  Pt will be independent with HEP for improved balance, strength, and gait.  TARGET 08/21/2019 (delayed due to pt having stent procedure 07/20/2019)    Time  4    Period  Weeks    Status  New    Target Date  08/21/19      PT LONG TERM GOAL #2   Title  Pt will improve 5x sit<>stand to less than or equal to 11.5 sec for improved functional strength.    Time  4    Period  Weeks    Status  New    Target Date  08/21/19      PT LONG TERM GOAL #3   Title  Pt will improve TUG score to less than or equal to 13.5 seconds for decreased fall risk.    Time  4    Period  Weeks    Status  New    Target Date  08/21/19      PT LONG TERM GOAL #4   Title  Pt will improve DGI score to at least 15/24 for decreased fall risk.    Time  4    Period  Weeks    Status  New    Target Date  08/21/19      PT LONG TERM GOAL #5   Title  Pt will verbalize understanding of fall prevention in home environment.    Time  4    Period  Weeks    Status  New    Target Date  08/21/19            Plan - 07/27/19 0930    Clinical Impression Statement  Continued with Rt leg  strength, gait and high level balance. He does tend to take smaller steps to trialed hurldles today to challeng his step length and balance and he needed constant cues to take bigger steps so he could clear his trial leg.    Personal  Factors and Comorbidities  Comorbidity 2    Comorbidities  Hx of OA, hx of bilateral hip replacements    Examination-Activity Limitations  Locomotion Level;Stairs    Examination-Participation Restrictions  Community Activity    Stability/Clinical Decision Making  Stable/Uncomplicated    Rehab Potential  Good    PT Frequency  2x / week    PT Duration  4 weeks   plus eval   PT Treatment/Interventions  ADLs/Self Care Home Management;Gait training;Stair training;Functional mobility training;Therapeutic activities;Therapeutic exercise;Balance training;Neuromuscular re-education;Patient/family education    PT Next Visit Plan  dynamic balance, sit<>stand, functional lower extremity strengthenining; gait training for improved initiation of gait and turns    PT Home Exercise Plan  Access Code: [email or web address]    Consulted and Agree with Plan of Care  Patient;Family member/caregiver    Family Member Consulted  brother       Patient will benefit from skilled therapeutic intervention in order to improve the following deficits and impairments:  Abnormal gait, Difficulty walking, Decreased balance, Decreased mobility, Decreased strength  Visit Diagnosis: 1. Abnormality of gait   2. Bilateral hip pain        Problem List Patient Active Problem List   Diagnosis Date Noted  . Vertebral artery stenosis 07/21/2019  . Vertebral artery narrowing, left 07/20/2019  . Non-fluent aphasia   . Acute bilateral cerebral infarction in a watershed distribution [facility]) 05/14/2019  . Vertebral artery narrowing, right 05/12/2019  . Hyperlipidemia 05/06/2019  . CVA (cerebral vascular accident) ([place]) 05/05/2019  . Internal carotid artery occlusion, bilateral   . Covid-19 Virus not Detected   . Status post total replacement of left hip 08/23/2017  . Unilateral primary osteoarthritis, left hip 05/02/2017  . Status post total replacement of right hip 03/22/2017  . PVCs (premature ventricular contractions) 03/14/2017  .  Essential hypertension 03/14/2017  . Unilateral primary osteoarthritis, right hip 03/08/2017  . Bilateral hip pain 10/15/2016  . Abnormality of gait 10/15/2016    Silvestre Mesi 07/27/2019, 9:34 AM  [facility] [address] [place], [name], [address] Phone: [phone]   Fax:  [phone]  Name: Ruffin Lada MRN: [id number] Date of Birth: Feb 09, 1951

## 2019-07-27 NOTE — Therapy (Signed)
[employer] [facility] [address] [place], [name], [address] Phone: [phone]   Fax:  [phone]  Occupational Therapy Treatment  Patient Details  Name: Jose Kaufman MRN: [id number] Date of Birth: May 13, 1951 Referring Provider (OT): Dr. Letta Pate   Encounter Date: 07/27/2019  OT End of Session - 07/27/19 0827    Visit Number  5    Number of Visits  17    Date for OT Re-Evaluation  08/27/19    Authorization Type  [employer] UMR    Authorization - Visit Number  5    Authorization - Number of Visits  15    OT Start Time  0800    OT Stop Time  0845    OT Time Calculation (min)  45 min    Activity Tolerance  Patient tolerated treatment well    Behavior During Therapy  [facility] for tasks assessed/performed       Past Medical History:  Diagnosis Date  . Abnormality of gait 10/15/2016  . Arthritis   . Dysrhythmia    hx of  . Essential hypertension 03/14/2017  . PVCs (premature ventricular contractions) 03/14/2017  . Skin cancer    Skin  . Stroke [facility])    Mini strokes . 06/25/2019  Expressive aphasia - improving  . Wears glasses     Past Surgical History:  Procedure Laterality Date  . COLONOSCOPY    . IR ANGIO INTRA EXTRACRAN SEL COM CAROTID INNOMINATE BILAT MOD SED  05/07/2019  . IR ANGIO INTRA EXTRACRAN SEL COM CAROTID INNOMINATE BILAT MOD SED  07/20/2019  . IR ANGIO VERTEBRAL SEL SUBCLAVIAN INNOMINATE BILAT MOD SED  05/07/2019  . IR ANGIO VERTEBRAL SEL SUBCLAVIAN INNOMINATE UNI R MOD SED  07/20/2019  . IR PATIENT EVAL TECH 0-60 MINS  06/08/2019  . IR TRANSCATH EXCRAN VERT OR CAR A STENT  05/12/2019  . IR TRANSCATH EXCRAN VERT OR CAR A STENT  07/20/2019  . IR US GUIDE VASC ACCESS RIGHT  05/12/2019  . RADIOLOGY WITH ANESTHESIA N/A 05/08/2019   Procedure: RADIOLOGY WITH ANESTHESIA;  Surgeon: [name], MD;  Location: [place];  Service: Radiology;  Laterality: N/A;  . RADIOLOGY WITH ANESTHESIA N/A 05/12/2019   Procedure: carotid  stenting;  Surgeon: [name], MD;  Location: [place];  Service: Radiology;  Laterality: N/A;  . RADIOLOGY WITH ANESTHESIA N/A 06/08/2019   Procedure: STENT PLACEMENT;  Surgeon: [name], MD;  Location: [place];  Service: Radiology;  Laterality: N/A;  . RADIOLOGY WITH ANESTHESIA N/A 07/20/2019   Procedure: RADIOLOGY WITH ANESTHESIA STENTING;  Surgeon: [name], MD;  Location: [place];  Service: Radiology;  Laterality: N/A;  . SKIN CANCER EXCISION    . TOTAL HIP ARTHROPLASTY Right 03/22/2017   Procedure: RIGHT TOTAL HIP ARTHROPLASTY ANTERIOR APPROACH;  Surgeon: Mcarthur Rossetti, MD;  [facility];  Service: Orthopedics;  Laterality: Right;  . TOTAL HIP ARTHROPLASTY Left 08/23/2017   Procedure: LEFT TOTAL HIP ARTHROPLASTY ANTERIOR APPROACH;  Surgeon: Mcarthur Rossetti, MD;  [facility];  Service: Orthopedics;  Laterality: Left;  . WISDOM TOOTH EXTRACTION      There were no vitals filed for this visit.  Subjective Assessment - 07/27/19 0802    Subjective   Pt has resume P.T. orders from recent surgery (stent placement) - will request resume orders for other disciplines    Patient is accompanied by:  Family member   brother   Pertinent History  05/05/19: acute Lt frontal and parietal watershed infarcts. Pt also  had old corticol and subcortical infarcts in both frontal and parietal regions, Bil THA 2018, HTN    Limitations  No driving, not currently working,  since surgery - no lifting > 10 lbs, no bending or stooping    Currently in Pain?  No/denies       Pt copying simple PVC pipe design with initial cues just to organize pieces, then pt completed correctly w/o cues. Pt then asked to assemble more complex design - pt did 100% accurate w/o assist.  Pt copying small peg design for [facility] RT hand and visual/perceptual skills - pt completed 100% accurate (except difference b/t blue and purple colors) w/ extra time. Pt then removed w/ Lt hand including in hand  manipulation/translation (5 pegs at a time)                       OT Short Term Goals - 07/27/19 0820      OT SHORT TERM GOAL #1   Title  Independent with HEP for coordination - 06/26/19    Time  4    Period  Weeks    Status  Achieved      OT SHORT TERM GOAL #2   Title  Improve coordination bilaterally by 5 sec. or more    Baseline  Rt = 38.47 sec, Lt = 33.93 sec    Time  4    Period  Weeks    Status  On-going      OT SHORT TERM GOAL #3   Title  Pt to perform tabletop visual scanning to far Rt and Lt side with no more than min questioning cues    Time  4    Period  Weeks    Status  On-going      OT SHORT TERM GOAL #4   Title  Pt to perform environmental scanning at 75% accuracy in prep for potential driving    Time  4    Period  Weeks    Status  New        OT Long Term Goals - 07/27/19 6147      OT LONG TERM GOAL #1   Title  Pt to perform simple stovetop meal safely with distant supervision - 07/27/19    Time  8    Period  Weeks    Status  New      OT LONG TERM GOAL #2   Title  Pt to perform environmental scanning at 90% accuracy while performing simple physical task for multi-tasking and in prep for driving    Time  8    Period  Weeks    Status  New      OT LONG TERM GOAL #3   Title  Pt to assemble pipe design in prep for functional assembly projects at home w/o cues    Time  8    Period  Weeks    Status  Achieved      OT LONG TERM GOAL #4   Title  Pt to perform computer tasks including searches at mod I level    Time  8    Period  Weeks    Status  New      OT LONG TERM GOAL #5   Title  Pt to type 10 wpm at 80% or greater accuracy (numbers when called out for work simulated tasks)    Time  8    Period  Weeks    Status  New  Plan - 07/27/19 0825    Clinical Impression Statement  Pt returns today after stent surgery. Pt appears to be functioning well w/ no changes in function since surgery. Pt has met 1 STG and 1 LTG  at this time.    Occupational performance deficits (Please refer to evaluation for details):  ADL's;IADL's;Work;Social Participation    Body Structure / Function / Physical Skills  IADL;Sensation;Strength;UE functional use;FMC;Coordination;GMC;Balance    Cognitive Skills  Attention;Safety Awareness;Sequencing;Understand;Memory;Perception;Problem Solve    Rehab Potential  Fair    Clinical Decision Making  Several treatment options, min-mod task modification necessary    Comorbidities Affecting Occupational Performance:  May have comorbidities impacting occupational performance    OT Frequency  2x / week    OT Duration  8 weeks    OT Treatment/Interventions  Self-care/ADL training;Therapeutic exercise;Functional Mobility Training;Neuromuscular education;Manual Therapy;Therapeutic activities;DME and/or AE instruction;Cognitive remediation/compensation;Visual/perceptual remediation/compensation;Patient/family education;Passive range of motion    Plan  cooking task, environmental scanning, typing    Consulted and Agree with Plan of Care  Patient;Family member/caregiver    Family Member Consulted  brother       Patient will benefit from skilled therapeutic intervention in order to improve the following deficits and impairments:   Body Structure / Function / Physical Skills: IADL, Sensation, Strength, UE functional use, FMC, Coordination, GMC, Balance Cognitive Skills: Attention, Safety Awareness, Sequencing, Understand, Memory, Perception, Problem Solve     Visit Diagnosis: 1. Attention and concentration deficit   2. Visuospatial deficit   3. Other symptoms and signs involving cognitive functions following cerebral infarction       Problem List Patient Active Problem List   Diagnosis Date Noted  . Vertebral artery stenosis 07/21/2019  . Vertebral artery narrowing, left 07/20/2019  . Non-fluent aphasia   . Acute bilateral cerebral infarction in a watershed distribution [facility]) 05/14/2019   . Vertebral artery narrowing, right 05/12/2019  . Hyperlipidemia 05/06/2019  . CVA (cerebral vascular accident) ([place]) 05/05/2019  . Internal carotid artery occlusion, bilateral   . Covid-19 Virus not Detected   . Status post total replacement of left hip 08/23/2017  . Unilateral primary osteoarthritis, left hip 05/02/2017  . Status post total replacement of right hip 03/22/2017  . PVCs (premature ventricular contractions) 03/14/2017  . Essential hypertension 03/14/2017  . Unilateral primary osteoarthritis, right hip 03/08/2017  . Bilateral hip pain 10/15/2016  . Abnormality of gait 10/15/2016    Carey Bullocks, OTR/L 07/27/2019, 8:28 AM  [facility] [address] [place], [name], [address] Phone: [phone]   Fax:  [phone]  Name: Jose Kaufman MRN: [id number] Date of Birth: [birth year]

## 2019-07-28 ENCOUNTER — Ambulatory Visit: Payer: Commercial Managed Care - PPO | Admitting: Speech Pathology

## 2019-07-28 DIAGNOSIS — R41841 Cognitive communication deficit: Secondary | ICD-10-CM

## 2019-07-28 DIAGNOSIS — R2689 Other abnormalities of gait and mobility: Secondary | ICD-10-CM | POA: Diagnosis not present

## 2019-07-28 DIAGNOSIS — R4701 Aphasia: Secondary | ICD-10-CM

## 2019-07-28 NOTE — Therapy (Signed)
[facility] [address] [place], [name], [address] Phone: [phone]   Fax:  [phone]  Speech Language Pathology Treatment  Patient Details  Name: Jose Kaufman MRN: [id number] Date of Birth: 17-Feb-1951 Referring Provider (SLP): Dr.Kirsteins   Encounter Date: 07/28/2019  End of Session - 07/28/19 1659    Visit Number  6    Number of Visits  17    Date for SLP Re-Evaluation  09/26/19    Authorization Type  [facility], 30 visits for OT/PT, 30 separate visits for ST    Authorization - Visit Number  5    Authorization - Number of Visits  30    SLP Start Time  1448    SLP Stop Time   1530    SLP Time Calculation (min)  42 min    Activity Tolerance  Patient tolerated treatment well       Past Medical History:  Diagnosis Date  . Abnormality of gait 10/15/2016  . Arthritis   . Dysrhythmia    hx of  . Essential hypertension 03/14/2017  . PVCs (premature ventricular contractions) 03/14/2017  . Skin cancer    Skin  . Stroke [facility])    Mini strokes . 06/25/2019  Expressive aphasia - improving  . Wears glasses     Past Surgical History:  Procedure Laterality Date  . COLONOSCOPY    . IR ANGIO INTRA EXTRACRAN SEL COM CAROTID INNOMINATE BILAT MOD SED  05/07/2019  . IR ANGIO INTRA EXTRACRAN SEL COM CAROTID INNOMINATE BILAT MOD SED  07/20/2019  . IR ANGIO VERTEBRAL SEL SUBCLAVIAN INNOMINATE BILAT MOD SED  05/07/2019  . IR ANGIO VERTEBRAL SEL SUBCLAVIAN INNOMINATE UNI R MOD SED  07/20/2019  . IR PATIENT EVAL TECH 0-60 MINS  06/08/2019  . IR TRANSCATH EXCRAN VERT OR CAR A STENT  05/12/2019  . IR TRANSCATH EXCRAN VERT OR CAR A STENT  07/20/2019  . IR US GUIDE VASC ACCESS RIGHT  05/12/2019  . RADIOLOGY WITH ANESTHESIA N/A 05/08/2019   Procedure: RADIOLOGY WITH ANESTHESIA;  Surgeon: [name], MD;  Location: [place];  Service: Radiology;  Laterality: N/A;  . RADIOLOGY WITH ANESTHESIA N/A 05/12/2019   Procedure: carotid stenting;   Surgeon: [name], MD;  Location: [place];  Service: Radiology;  Laterality: N/A;  . RADIOLOGY WITH ANESTHESIA N/A 06/08/2019   Procedure: STENT PLACEMENT;  Surgeon: [name], MD;  Location: [place];  Service: Radiology;  Laterality: N/A;  . RADIOLOGY WITH ANESTHESIA N/A 07/20/2019   Procedure: RADIOLOGY WITH ANESTHESIA STENTING;  Surgeon: [name], MD;  Location: [employer];  Service: Radiology;  Laterality: N/A;  . SKIN CANCER EXCISION    . TOTAL HIP ARTHROPLASTY Right 03/22/2017   Procedure: RIGHT TOTAL HIP ARTHROPLASTY ANTERIOR APPROACH;  Surgeon: Mcarthur Rossetti, MD;  Location: [facility];  Service: Orthopedics;  Laterality: Right;  . TOTAL HIP ARTHROPLASTY Left 08/23/2017   Procedure: LEFT TOTAL HIP ARTHROPLASTY ANTERIOR APPROACH;  Surgeon: Mcarthur Rossetti, MD;  Location: [facility];  Service: Orthopedics;  Laterality: Left;  . WISDOM TOOTH EXTRACTION      There were no vitals filed for this visit.         ADULT SLP TREATMENT - 07/28/19 1658      General Information   Behavior/Cognition  Alert;Cooperative      Treatment Provided   Treatment provided  Cognitive-Linquistic      Pain Assessment   Pain Assessment  No/denies pain      Cognitive-Linquistic Treatment  Treatment focused on  Cognition;Aphasia    Skilled Treatment  Pt used slower rate in 5 min simple conversation with SLP re: his surgery and therapy activities and wordfinding was within functional limits. Occasional hesitation, stuttering with more complex/specific information re: work details. Pt corrected 4/4 spelling errors in functional writing task (drafting email to mock work scenario). Brother and pt report he has been managing his appointments with printed therapy calendar at home; pt calls his brother to let him know when he needs rides to appointments. Pt homework 100% accurate with SLP noting 2 corrections pt had made at home. In more complex cognitive tasks (reasoning, problem  solving), pt required usual mod A.       Assessment / Recommendations / Plan   Plan  Continue with current plan of care      Progression Toward Goals   Progression toward goals  Progressing toward goals         SLP Short Term Goals - 07/28/19 1500      SLP SHORT TERM GOAL #1   Title  Pt will maintain selective attention for 15 minutes to a functional task (such as meal planning, billpaying) a moderately distracting environment x3 visits.    Baseline  06/25/19, 07/07/19, 07/09/19    Time  2    Period  Weeks    Status  Achieved      SLP SHORT TERM GOAL #2   Title  Pt will complete further assessment of language, including reading/writing, with goals added as needed.    Baseline  06/22/19    Time  2    Period  Weeks    Status  Achieved      SLP SHORT TERM GOAL #3   Title  Pt will tell SLP 3 cognitive deficits.    Time  2    Period  Weeks    Status  On-going      SLP SHORT TERM GOAL #4   Title  Pt will utilize compensations for anomia in simple conversation with rare min A over 2 sessions    Baseline  07/28/19    Time  2    Period  Weeks    Status  On-going   simple conversation WFL     SLP SHORT TERM GOAL #5   Title  Pt will write simple sentences with extended time and occasional min A.    Baseline  07/28/19    Time  2    Period  Weeks    Status  On-going       SLP Long Term Goals - 07/28/19 1510      SLP LONG TERM GOAL #1   Title  Pt will use external aids/notebook to manage finances, schedule, meal-planning x3 sessions.    Baseline  07/28/19    Time  6    Period  Weeks    Status  On-going      SLP LONG TERM GOAL #2   Title  Pt will correct 75% of errors in functional therapy tasks with rare min A for double checking.    Time  6    Period  Weeks    Status  On-going      SLP LONG TERM GOAL #3   Title  Pt will use compensations functionally (slowed rate, preplanning) in phone call scenarios x3 sessions.    Time  6    Status  Deferred      SLP LONG TERM  GOAL #4   Title  Pt  will utilize compensations for aphasia over 15 minute moderately complex conversation with rare min A over 2 sessions    Time  6    Period  Weeks    Status  On-going       Plan - 07/28/19 1659    Clinical Impression Statement  Pt presents with improving cognitive communication and expressive language deficits, which appear more in range of mild today. Mild stuttering, hesistations in mod complex conversation today. Slow processing persists in all tasks, as well as decreased awareness of deficits and functional impacts. I recommend skilled ST to address cognitive and language impairments in order to improve safety, independence and for possible return to work.    Speech Therapy Frequency  2x / week    Duration  --   8 weeks or 17 visits   Treatment/Interventions  Compensatory strategies;Patient/family education;Functional tasks;Cueing hierarchy;Environmental controls;Cognitive reorganization;Multimodal communcation approach;Internal/external aids;Compensatory techniques;Language facilitation;SLP instruction and feedback    Potential to Achieve Goals  Good    Potential Considerations  Ability to learn/carryover information    Consulted and Agree with Plan of Care  Patient;Family member/caregiver    Family Member Consulted  brother       Patient will benefit from skilled therapeutic intervention in order to improve the following deficits and impairments:   1. Cognitive communication deficit   2. Aphasia       Problem List Patient Active Problem List   Diagnosis Date Noted  . Vertebral artery stenosis 07/21/2019  . Vertebral artery narrowing, left 07/20/2019  . Non-fluent aphasia   . Acute bilateral cerebral infarction in a watershed distribution [facility]) 05/14/2019  . Vertebral artery narrowing, right 05/12/2019  . Hyperlipidemia 05/06/2019  . CVA (cerebral vascular accident) ([place]) 05/05/2019  . Internal carotid artery occlusion, bilateral   . Covid-19 Virus not  Detected   . Status post total replacement of left hip 08/23/2017  . Unilateral primary osteoarthritis, left hip 05/02/2017  . Status post total replacement of right hip 03/22/2017  . PVCs (premature ventricular contractions) 03/14/2017  . Essential hypertension 03/14/2017  . Unilateral primary osteoarthritis, right hip 03/08/2017  . Bilateral hip pain 10/15/2016  . Abnormality of gait 10/15/2016   Deneise Lever, [place], [place] 07/28/2019, 5:01 PM  [facility] [address] [place], [name], [address] Phone: [phone]   Fax:  [phone]   Name: Jose Kaufman MRN: [id number] Date of Birth: [birth year]

## 2019-07-29 ENCOUNTER — Ambulatory Visit: Payer: Commercial Managed Care - PPO | Admitting: Occupational Therapy

## 2019-07-29 ENCOUNTER — Encounter: Payer: Self-pay | Admitting: Occupational Therapy

## 2019-07-29 ENCOUNTER — Ambulatory Visit: Payer: Commercial Managed Care - PPO | Admitting: Physical Therapy

## 2019-07-29 ENCOUNTER — Other Ambulatory Visit: Payer: Self-pay

## 2019-07-29 DIAGNOSIS — M25551 Pain in right hip: Secondary | ICD-10-CM

## 2019-07-29 DIAGNOSIS — R2681 Unsteadiness on feet: Secondary | ICD-10-CM

## 2019-07-29 DIAGNOSIS — I69318 Other symptoms and signs involving cognitive functions following cerebral infarction: Secondary | ICD-10-CM

## 2019-07-29 DIAGNOSIS — R2689 Other abnormalities of gait and mobility: Secondary | ICD-10-CM | POA: Diagnosis not present

## 2019-07-29 DIAGNOSIS — R269 Unspecified abnormalities of gait and mobility: Secondary | ICD-10-CM

## 2019-07-29 DIAGNOSIS — R278 Other lack of coordination: Secondary | ICD-10-CM

## 2019-07-29 DIAGNOSIS — M6281 Muscle weakness (generalized): Secondary | ICD-10-CM

## 2019-07-29 DIAGNOSIS — R41842 Visuospatial deficit: Secondary | ICD-10-CM

## 2019-07-29 DIAGNOSIS — M25552 Pain in left hip: Secondary | ICD-10-CM

## 2019-07-29 NOTE — Therapy (Signed)
[facility] [address] [place], [name], [address] Phone: [phone]   Fax:  [phone]  Physical Therapy Treatment  Patient Details  Name: Jose Kaufman MRN: [id number] Date of Birth: Feb 03, 1951 Referring Provider (PT): Leonie Man   Encounter Date: 07/29/2019  PT End of Session - 07/29/19 1102    Visit Number  4    Number of Visits  9    PT Start Time  1015    PT Stop Time  1055    PT Time Calculation (min)  40 min    Equipment Utilized During Treatment  Gait belt    Activity Tolerance  Patient tolerated treatment well    Behavior During Therapy  [facility] for tasks assessed/performed       Past Medical History:  Diagnosis Date  . Abnormality of gait 10/15/2016  . Arthritis   . Dysrhythmia    hx of  . Essential hypertension 03/14/2017  . PVCs (premature ventricular contractions) 03/14/2017  . Skin cancer    Skin  . Stroke [facility])    Mini strokes . 06/25/2019  Expressive aphasia - improving  . Wears glasses     Past Surgical History:  Procedure Laterality Date  . COLONOSCOPY    . IR ANGIO INTRA EXTRACRAN SEL COM CAROTID INNOMINATE BILAT MOD SED  05/07/2019  . IR ANGIO INTRA EXTRACRAN SEL COM CAROTID INNOMINATE BILAT MOD SED  07/20/2019  . IR ANGIO VERTEBRAL SEL SUBCLAVIAN INNOMINATE BILAT MOD SED  05/07/2019  . IR ANGIO VERTEBRAL SEL SUBCLAVIAN INNOMINATE UNI R MOD SED  07/20/2019  . IR PATIENT EVAL TECH 0-60 MINS  06/08/2019  . IR TRANSCATH EXCRAN VERT OR CAR A STENT  05/12/2019  . IR TRANSCATH EXCRAN VERT OR CAR A STENT  07/20/2019  . IR US GUIDE VASC ACCESS RIGHT  05/12/2019  . RADIOLOGY WITH ANESTHESIA N/A 05/08/2019   Procedure: RADIOLOGY WITH ANESTHESIA;  Surgeon: [name], MD;  Location: [place];  Service: Radiology;  Laterality: N/A;  . RADIOLOGY WITH ANESTHESIA N/A 05/12/2019   Procedure: carotid stenting;  Surgeon: [name], MD;  Location: [place];  Service: Radiology;  Laterality: N/A;  . RADIOLOGY  WITH ANESTHESIA N/A 06/08/2019   Procedure: STENT PLACEMENT;  Surgeon: [name], MD;  Location: [place];  Service: Radiology;  Laterality: N/A;  . RADIOLOGY WITH ANESTHESIA N/A 07/20/2019   Procedure: RADIOLOGY WITH ANESTHESIA STENTING;  Surgeon: [name], MD;  Location: [place];  Service: Radiology;  Laterality: N/A;  . SKIN CANCER EXCISION    . TOTAL HIP ARTHROPLASTY Right 03/22/2017   Procedure: RIGHT TOTAL HIP ARTHROPLASTY ANTERIOR APPROACH;  Surgeon: Mcarthur Rossetti, MD;  [facility];  Service: Orthopedics;  Laterality: Right;  . TOTAL HIP ARTHROPLASTY Left 08/23/2017   Procedure: LEFT TOTAL HIP ARTHROPLASTY ANTERIOR APPROACH;  Surgeon: Mcarthur Rossetti, MD;  [facility];  Service: Orthopedics;  Laterality: Left;  . WISDOM TOOTH EXTRACTION      There were no vitals filed for this visit.  Subjective Assessment - 07/29/19 1100    Subjective  Relays soreness if better, he has been working on his walking a lot at home    Patient Stated Goals  Pt's goal for therapy is to walk better.    Currently in Pain?  No/denies                       [facility] Adult PT Treatment/Exercise - 07/29/19 0001      Ambulation/Gait  Gait Comments  4 laps warm up (440 ft) supervision, cues for arm swing and bigger steps      High Level Balance   High Level Balance Comments  --      Exercises   Exercises  Knee/Hip      Knee/Hip Exercises: Standing   Other Standing Knee Exercises  step ups fwd onto 2nd step Rt leg X 10, then first step lateral step ups on Rt  X20      Knee/Hip Exercises: Seated   Sit to Sand  2 sets;without UE support;15 reps      Knee/Hip Exercises: Supine   Bridges  20 reps    Straight Leg Raises  Right;20 reps      Knee/Hip Exercises: Sidelying   Hip ABduction  Right;20 reps      Manual Therapy   Manual therapy comments  passive ROM bilat hips for flexion, IR, ER, manual stretching for hamstrings and glutes bilat       gait  training for bigger steps with visual cues to step larger over line,  X20 fwd and lateral both legs, then march walking while holding onto counter with one UE support up/down  X5, then walked one lap and he did have some carryover with larger steps but no armswing. Then worked on reciprocal arm swings  20 and then ambulated another lap with better arm swing.     PT Long Term Goals - 07/13/19 1136      PT LONG TERM GOAL #1   Title  Pt will be independent with HEP for improved balance, strength, and gait.  TARGET 08/21/2019 (delayed due to pt having stent procedure 07/20/2019)    Time  4    Period  Weeks    Status  New    Target Date  08/21/19      PT LONG TERM GOAL #2   Title  Pt will improve 5x sit<>stand to less than or equal to 11.5 sec for improved functional strength.    Time  4    Period  Weeks    Status  New    Target Date  08/21/19      PT LONG TERM GOAL #3   Title  Pt will improve TUG score to less than or equal to 13.5 seconds for decreased fall risk.    Time  4    Period  Weeks    Status  New    Target Date  08/21/19      PT LONG TERM GOAL #4   Title  Pt will improve DGI score to at least 15/24 for decreased fall risk.    Time  4    Period  Weeks    Status  New    Target Date  08/21/19      PT LONG TERM GOAL #5   Title  Pt will verbalize understanding of fall prevention in home environment.    Time  4    Period  Weeks    Status  New    Target Date  08/21/19            Plan - 07/29/19 1104    Clinical Impression Statement  worked more on gait with focus on taking bigger steps and reciprocal arm swing. He did appear to improve gait pattern after session but needs more practice due to lack of coordination and preference of taking smaller steps. His Rt leg strength is improving overall    Personal Factors and Comorbidities  Comorbidity  2    Comorbidities  Hx of OA, hx of bilateral hip replacements    Examination-Activity Limitations  Locomotion Level;Stairs     Examination-Participation Restrictions  Community Activity    Stability/Clinical Decision Making  Stable/Uncomplicated    Rehab Potential  Good    PT Frequency  2x / week    PT Duration  4 weeks   plus eval   PT Treatment/Interventions  ADLs/Self Care Home Management;Gait training;Stair training;Functional mobility training;Therapeutic activities;Therapeutic exercise;Balance training;Neuromuscular re-education;Patient/family education    PT Next Visit Plan  dynamic balance, sit<>stand, functional lower extremity strengthenining; gait training for improved initiation of gait and turns    PT Home Exercise Plan  Access Code: [email or web address]    Consulted and Agree with Plan of Care  Patient;Family member/caregiver    Family Member Consulted  brother       Patient will benefit from skilled therapeutic intervention in order to improve the following deficits and impairments:  Abnormal gait, Difficulty walking, Decreased balance, Decreased mobility, Decreased strength  Visit Diagnosis: 1. Abnormality of gait   2. Bilateral hip pain   3. Muscle weakness (generalized)   4. Unsteadiness on feet        Problem List Patient Active Problem List   Diagnosis Date Noted  . Vertebral artery stenosis 07/21/2019  . Vertebral artery narrowing, left 07/20/2019  . Non-fluent aphasia   . Acute bilateral cerebral infarction in a watershed distribution [facility]) 05/14/2019  . Vertebral artery narrowing, right 05/12/2019  . Hyperlipidemia 05/06/2019  . CVA (cerebral vascular accident) ([place]) 05/05/2019  . Internal carotid artery occlusion, bilateral   . Covid-19 Virus not Detected   . Status post total replacement of left hip 08/23/2017  . Unilateral primary osteoarthritis, left hip 05/02/2017  . Status post total replacement of right hip 03/22/2017  . PVCs (premature ventricular contractions) 03/14/2017  . Essential hypertension 03/14/2017  . Unilateral primary osteoarthritis, right hip 03/08/2017  . Bilateral  hip pain 10/15/2016  . Abnormality of gait 10/15/2016    Silvestre Mesi 07/29/2019, 11:14 AM  [facility] [address] [place], [name], [address] Phone: [phone]   Fax:  [phone]  Name: Jose Kaufman MRN: [id number] Date of Birth: 08-Oct-1951

## 2019-07-29 NOTE — Therapy (Signed)
[facility] [address] [place], [name], [address] Phone: [phone]   Fax:  [phone]  Occupational Therapy Treatment  Patient Details  Name: Jose Kaufman MRN: [id number] Date of Birth: 1951/11/06 Referring Provider (OT): Dr. Letta Pate   Encounter Date: 07/29/2019  OT End of Session - 07/29/19 0943    Visit Number  6    Number of Visits  17    Date for OT Re-Evaluation  08/27/19    Authorization Type  [facility] - Visit Number  6    Authorization - Number of Visits  15    OT Start Time  [phone]    OT Stop Time  1017    OT Time Calculation (min)  38 min    Activity Tolerance  Patient tolerated treatment well    Behavior During Therapy  [facility] for tasks assessed/performed       Past Medical History:  Diagnosis Date  . Abnormality of gait 10/15/2016  . Arthritis   . Dysrhythmia    hx of  . Essential hypertension 03/14/2017  . PVCs (premature ventricular contractions) 03/14/2017  . Skin cancer    Skin  . Stroke [facility])    Mini strokes . 06/25/2019  Expressive aphasia - improving  . Wears glasses     Past Surgical History:  Procedure Laterality Date  . COLONOSCOPY    . IR ANGIO INTRA EXTRACRAN SEL COM CAROTID INNOMINATE BILAT MOD SED  05/07/2019  . IR ANGIO INTRA EXTRACRAN SEL COM CAROTID INNOMINATE BILAT MOD SED  07/20/2019  . IR ANGIO VERTEBRAL SEL SUBCLAVIAN INNOMINATE BILAT MOD SED  05/07/2019  . IR ANGIO VERTEBRAL SEL SUBCLAVIAN INNOMINATE UNI R MOD SED  07/20/2019  . IR PATIENT EVAL TECH 0-60 MINS  06/08/2019  . IR TRANSCATH EXCRAN VERT OR CAR A STENT  05/12/2019  . IR TRANSCATH EXCRAN VERT OR CAR A STENT  07/20/2019  . IR US GUIDE VASC ACCESS RIGHT  05/12/2019  . RADIOLOGY WITH ANESTHESIA N/A 05/08/2019   Procedure: RADIOLOGY WITH ANESTHESIA;  Surgeon: [name], MD;  Location: [place];  Service: Radiology;  Laterality: N/A;  . RADIOLOGY WITH ANESTHESIA N/A 05/12/2019   Procedure: carotid  stenting;  Surgeon: [name], MD;  Location: [place];  Service: Radiology;  Laterality: N/A;  . RADIOLOGY WITH ANESTHESIA N/A 06/08/2019   Procedure: STENT PLACEMENT;  Surgeon: [name], MD;  Location: [place];  Service: Radiology;  Laterality: N/A;  . RADIOLOGY WITH ANESTHESIA N/A 07/20/2019   Procedure: RADIOLOGY WITH ANESTHESIA STENTING;  Surgeon: [name], MD;  Location: [place];  Service: Radiology;  Laterality: N/A;  . SKIN CANCER EXCISION    . TOTAL HIP ARTHROPLASTY Right 03/22/2017   Procedure: RIGHT TOTAL HIP ARTHROPLASTY ANTERIOR APPROACH;  Surgeon: Mcarthur Rossetti, MD;  Location: [facility];  Service: Orthopedics;  Laterality: Right;  . TOTAL HIP ARTHROPLASTY Left 08/23/2017   Procedure: LEFT TOTAL HIP ARTHROPLASTY ANTERIOR APPROACH;  Surgeon: Mcarthur Rossetti, MD;  Location: [facility];  Service: Orthopedics;  Laterality: Left;  . WISDOM TOOTH EXTRACTION      There were no vitals filed for this visit.  Subjective Assessment - 07/29/19 0940    Subjective   procedure went well    Patient is accompanied by:  Family member   brother   Pertinent History  05/05/19: acute Lt frontal and parietal watershed infarcts. Pt also had old corticol and subcortical infarcts in both frontal and parietal regions, Bil THA 2018,  HTN    Limitations  No driving, not currently working,  since surgery - no lifting > 10 lbs, no bending or stooping    Currently in Pain?  No/denies         Copying small peg design with incr time and approx 80% accuracy copying initially with mod-max cueing to correct.  Pt with incr difficulty/errors with right and bottom of design.   Alternating hands for incr coordination with min cueing for L hand.    Environmental scanning in minimally distracting environment with 10/15 items found upon first pass, 2 additional upon 2nd pass, and 1 addition on 3rd pass (all with min-mod reminders/cueing for visual scanning). 4/5 missed items were on the right  side.     Constant therapy:  Symbol match level 5 with 100% accuracy and on level 10 with  incr time, but good accuracy for small range table-top visual scanning.            OT Education - 07/29/19 1208    Education Details  Discussed difficulty during session today to incr awareness (difficulty with visual perceptual skills on R side) and that pt should incr head turns to compensate when walking    Person(s) Educated  Patient;Caregiver(s)    Methods  Explanation    Comprehension  Verbalized understanding       OT Short Term Goals - 07/29/19 1000      OT SHORT TERM GOAL #1   Title  Independent with HEP for coordination - 06/26/19    Time  4    Period  Weeks    Status  Achieved      OT SHORT TERM GOAL #2   Title  Improve coordination bilaterally by 5 sec. or more    Baseline  Rt = 38.47 sec, Lt = 33.93 sec    Time  4    Period  Weeks    Status  On-going   07/29/19:  R-35.12sec, L-31.40sec     OT SHORT TERM GOAL #3   Title  Pt to perform tabletop visual scanning to far Rt and Lt side with no more than min questioning cues    Time  4    Period  Weeks    Status  On-going      OT SHORT TERM GOAL #4   Title  Pt to perform environmental scanning at 75% accuracy in prep for potential driving    Time  4    Period  Weeks    Status  New        OT Long Term Goals - 07/27/19 7169      OT LONG TERM GOAL #1   Title  Pt to perform simple stovetop meal safely with distant supervision - 07/27/19    Time  8    Period  Weeks    Status  New      OT LONG TERM GOAL #2   Title  Pt to perform environmental scanning at 90% accuracy while performing simple physical task for multi-tasking and in prep for driving    Time  8    Period  Weeks    Status  New      OT LONG TERM GOAL #3   Title  Pt to assemble pipe design in prep for functional assembly projects at home w/o cues    Time  8    Period  Weeks    Status  Achieved      OT LONG TERM GOAL #4  Title  Pt to perform computer  tasks including searches at mod I level    Time  8    Period  Weeks    Status  New      OT LONG TERM GOAL #5   Title  Pt to type 10 wpm at 80% or greater accuracy (numbers when called out for work simulated tasks)    Time  [place] - 07/29/19 0943    Clinical Impression Statement  Pt demo incr difficulty with  visual perceptual skills and environmental scanning to the right today.    Occupational performance deficits (Please refer to evaluation for details):  ADL's;IADL's;Work;Social Participation    Body Structure / Function / Physical Skills  IADL;Sensation;Strength;UE functional use;FMC;Coordination;GMC;Balance    Cognitive Skills  Attention;Safety Awareness;Sequencing;Understand;Memory;Perception;Problem Solve    Rehab Potential  Fair    Clinical Decision Making  Several treatment options, min-mod task modification necessary    Comorbidities Affecting Occupational Performance:  May have comorbidities impacting occupational performance    OT Frequency  2x / week    OT Duration  8 weeks    OT Treatment/Interventions  Self-care/ADL training;Therapeutic exercise;Functional Mobility Training;Neuromuscular education;Manual Therapy;Therapeutic activities;DME and/or AE instruction;Cognitive remediation/compensation;Visual/perceptual remediation/compensation;Patient/family education;Passive range of motion    Plan  cooking task, typing, environmental scanning, tabletop scanning to far right/left    Consulted and Agree with Plan of Care  Patient;Family member/caregiver    Family Member Consulted  brother       Patient will benefit from skilled therapeutic intervention in order to improve the following deficits and impairments:   Body Structure / Function / Physical Skills: IADL, Sensation, Strength, UE functional use, FMC, Coordination, GMC, Balance Cognitive Skills: Attention, Safety Awareness, Sequencing, Understand, Memory, Perception,  Problem Solve     Visit Diagnosis: 1. Visuospatial deficit   2. Other symptoms and signs involving cognitive functions following cerebral infarction   3. Unsteadiness on feet   4. Other lack of coordination       Problem List Patient Active Problem List   Diagnosis Date Noted  . Vertebral artery stenosis 07/21/2019  . Vertebral artery narrowing, left 07/20/2019  . Non-fluent aphasia   . Acute bilateral cerebral infarction in a watershed distribution [facility]) 05/14/2019  . Vertebral artery narrowing, right 05/12/2019  . Hyperlipidemia 05/06/2019  . CVA (cerebral vascular accident) ([place]) 05/05/2019  . Internal carotid artery occlusion, bilateral   . Covid-19 Virus not Detected   . Status post total replacement of left hip 08/23/2017  . Unilateral primary osteoarthritis, left hip 05/02/2017  . Status post total replacement of right hip 03/22/2017  . PVCs (premature ventricular contractions) 03/14/2017  . Essential hypertension 03/14/2017  . Unilateral primary osteoarthritis, right hip 03/08/2017  . Bilateral hip pain 10/15/2016  . Abnormality of gait 10/15/2016    [facility] 07/29/2019, 12:10 PM  [facility] [address] [place], [name], [address] Phone: [phone]   Fax:  [phone]  Name: Jose Kaufman MRN: [id number] Date of Birth: 1951-03-16   Vianne Bulls, OTR/L [facility] [address] [place], [place]  [address] [phone] phone [phone] 07/29/19 12:10 PM

## 2019-07-30 ENCOUNTER — Telehealth: Payer: Self-pay

## 2019-07-30 ENCOUNTER — Ambulatory Visit: Payer: Commercial Managed Care - PPO | Admitting: Speech Pathology

## 2019-07-30 DIAGNOSIS — R4701 Aphasia: Secondary | ICD-10-CM

## 2019-07-30 DIAGNOSIS — R2689 Other abnormalities of gait and mobility: Secondary | ICD-10-CM | POA: Diagnosis not present

## 2019-07-30 DIAGNOSIS — R41841 Cognitive communication deficit: Secondary | ICD-10-CM

## 2019-07-30 NOTE — Telephone Encounter (Signed)
Please check inbasket message from O.T. concerning deficits as it may impact driving and returning to work. You see this patient on Monday Thank you,  Redmond Baseman, OTR/L

## 2019-07-31 NOTE — Therapy (Signed)
[facility] [address] [place], [name], [address] Phone: [phone]   Fax:  [phone]  Speech Language Pathology Treatment  Patient Details  Name: Jose Kaufman MRN: [id number] Date of Birth: [birth year] Referring Provider (SLP): Dr.Kirsteins   Encounter Date: 07/30/2019  End of Session - 07/31/19 0856    Visit Number  7    Number of Visits  17    Date for SLP Re-Evaluation  09/26/19    Authorization Type  [facility], 30 visits for OT/PT, 30 separate visits for ST    Authorization - Visit Number  6    Authorization - Number of Visits  30    SLP Start Time  [id number]    SLP Stop Time   1530    SLP Time Calculation (min)  45 min    Activity Tolerance  Patient tolerated treatment well       Past Medical History:  Diagnosis Date  . Abnormality of gait 10/15/2016  . Arthritis   . Dysrhythmia    hx of  . Essential hypertension 03/14/2017  . PVCs (premature ventricular contractions) 03/14/2017  . Skin cancer    Skin  . Stroke [facility])    Mini strokes . 06/25/2019  Expressive aphasia - improving  . Wears glasses     Past Surgical History:  Procedure Laterality Date  . COLONOSCOPY    . IR ANGIO INTRA EXTRACRAN SEL COM CAROTID INNOMINATE BILAT MOD SED  05/07/2019  . IR ANGIO INTRA EXTRACRAN SEL COM CAROTID INNOMINATE BILAT MOD SED  07/20/2019  . IR ANGIO VERTEBRAL SEL SUBCLAVIAN INNOMINATE BILAT MOD SED  05/07/2019  . IR ANGIO VERTEBRAL SEL SUBCLAVIAN INNOMINATE UNI R MOD SED  07/20/2019  . IR PATIENT EVAL TECH 0-60 MINS  06/08/2019  . IR TRANSCATH EXCRAN VERT OR CAR A STENT  05/12/2019  . IR TRANSCATH EXCRAN VERT OR CAR A STENT  07/20/2019  . IR US GUIDE VASC ACCESS RIGHT  05/12/2019  . RADIOLOGY WITH ANESTHESIA N/A 05/08/2019   Procedure: RADIOLOGY WITH ANESTHESIA;  Surgeon: [name], MD;  Location: [place];  Service: Radiology;  Laterality: N/A;  . RADIOLOGY WITH ANESTHESIA N/A 05/12/2019   Procedure: carotid stenting;   Surgeon: [name], MD;  Location: [place];  Service: Radiology;  Laterality: N/A;  . RADIOLOGY WITH ANESTHESIA N/A 06/08/2019   Procedure: STENT PLACEMENT;  Surgeon: [name], MD;  Location: [place];  Service: Radiology;  Laterality: N/A;  . RADIOLOGY WITH ANESTHESIA N/A 07/20/2019   Procedure: RADIOLOGY WITH ANESTHESIA STENTING;  Surgeon: [name], MD;  Location: [place];  Service: Radiology;  Laterality: N/A;  . SKIN CANCER EXCISION    . TOTAL HIP ARTHROPLASTY Right 03/22/2017   Procedure: RIGHT TOTAL HIP ARTHROPLASTY ANTERIOR APPROACH;  Surgeon: Mcarthur Rossetti, MD;  [facility];  Service: Orthopedics;  Laterality: Right;  . TOTAL HIP ARTHROPLASTY Left 08/23/2017   Procedure: LEFT TOTAL HIP ARTHROPLASTY ANTERIOR APPROACH;  Surgeon: Mcarthur Rossetti, MD;  [facility];  Service: Orthopedics;  Laterality: Left;  . WISDOM TOOTH EXTRACTION      There were no vitals filed for this visit.  Subjective Assessment - 07/30/19 1452    Subjective  "It was a challenge."    Patient is accompained by:  Family member    Currently in Pain?  No/denies            ADULT SLP TREATMENT - 07/31/19 0843      General Information   Behavior/Cognition  Alert;Cooperative      Treatment Provided   Treatment provided  Cognitive-Linquistic      Pain Assessment   Pain Assessment  No/denies pain      Cognitive-Linquistic Treatment   Treatment focused on  Cognition;Aphasia    Skilled Treatment  Pt's homework (reasoning, attention to detail) >95% correct. Pt reported he worked each day for 1 hour (SLP estimates these tasks should have taken no more than 30 minutes each day). SLP worked with pt to ID strategies he could use to expedite his performance in these tasks (using checkmarks and eliminating possibilities that would slow his thinking). SLP demonstrated with one of pt's homework tasks; pt required usual mod cues to use these strategies as he reviewed 2  subsequent tasks. Pt wrote simple sentences with extended time. When SLP inquired re: what pt feels his deficits are, pt could not generate any despite SLP's repeated teachback during sessions. Pt reporting he hopes to receive approval to return to driving and go back to work. Pt/brother asked what SLP felt pt's prognosis is for this. SLP educated that while pt has made improvements, concerned that pt's processing speed, visuospatial, and attention deficits would impact pt's ability to react quickly and impact his/others' safety and ability to succeed in the workplace, and that driving eval may be appropriate if pt is determined to drive. Brother states he has been driving with pt in the neighborhood and SLP advised to refrain from doing so until further discussion with MD.      Assessment / Recommendations / [place] with current plan of care      Progression Toward Goals   Progression toward goals  Progressing toward goals       SLP Education - 07/31/19 0856    Education Details  deficit areas, how processing speed, visuospatial and attention deficits affect driving    Person(s) Educated  Patient;Other (comment)   brother   Methods  Explanation    Comprehension  Verbalized understanding;Need further instruction       SLP Short Term Goals - 07/31/19 0859      SLP SHORT TERM GOAL #1   Title  Pt will maintain selective attention for 15 minutes to a functional task (such as meal planning, billpaying) a moderately distracting environment x3 visits.    Baseline  06/25/19, 07/07/19, 07/09/19    Time  2    Period  Weeks    Status  Achieved      SLP SHORT TERM GOAL #2   Title  Pt will complete further assessment of language, including reading/writing, with goals added as needed.    Baseline  06/22/19    Time  2    Period  Weeks    Status  Achieved      SLP SHORT TERM GOAL #3   Title  Pt will tell SLP 3 cognitive deficits.    Time  2    Period  Weeks    Status  On-going       SLP SHORT TERM GOAL #4   Title  Pt will utilize compensations for anomia in simple conversation with rare min A over 2 sessions    Baseline  07/28/19 07/30/19    Time  2    Period  Weeks    Status  Achieved   simple conversation [facility]     SLP SHORT TERM GOAL #5   Title  Pt will write simple sentences with extended time and occasional min A.  Baseline  07/28/19 07/30/19    Time  2    Period  Weeks    Status  Achieved       SLP Long Term Goals - 07/31/19 0859      SLP LONG TERM GOAL #1   Title  Pt will use external aids/notebook to manage finances, schedule, meal-planning x3 sessions.    Baseline  07/28/19    Time  6    Period  Weeks    Status  On-going      SLP LONG TERM GOAL #2   Title  Pt will correct 75% of errors in functional therapy tasks with rare min A for double checking.    Time  6    Period  Weeks    Status  On-going      SLP LONG TERM GOAL #3   Title  Pt will use compensations functionally (slowed rate, preplanning) in phone call scenarios x3 sessions.    Time  6    Status  Deferred      SLP LONG TERM GOAL #4   Title  Pt will utilize compensations for aphasia over 15 minute moderately complex conversation with rare min A over 2 sessions    Time  6    Period  Weeks    Status  On-going       Plan - 07/31/19 0857    Clinical Impression Statement  Pt presents with improving cognitive communication and expressive language deficits, which appear more in range of mild today. Mild stuttering, hesistations in mod complex conversation today. Slow processing persists in all tasks, as well as decreased awareness of deficits and functional impacts. Discussion today with pt and brother re: how deficit areas may affect pt's ability to drive safely. See skilled intervention for details. I recommend skilled ST to address cognitive and language impairments in order to improve safety, independence and for possible return to work.    Speech Therapy Frequency  2x / week    Duration   --   8 weeks or 17 visits   Treatment/Interventions  Compensatory strategies;Patient/family education;Functional tasks;Cueing hierarchy;Environmental controls;Cognitive reorganization;Multimodal communcation approach;Internal/external aids;Compensatory techniques;Language facilitation;SLP instruction and feedback    Potential to Achieve Goals  Good    Potential Considerations  Ability to learn/carryover information    Consulted and Agree with Plan of Care  Patient;Family member/caregiver    Family Member Consulted  brother       Patient will benefit from skilled therapeutic intervention in order to improve the following deficits and impairments:   Cognitive communication deficit  Aphasia    Problem List Patient Active Problem List   Diagnosis Date Noted  . Vertebral artery stenosis 07/21/2019  . Vertebral artery narrowing, left 07/20/2019  . Non-fluent aphasia   . Acute bilateral cerebral infarction in a watershed distribution [facility]) 05/14/2019  . Vertebral artery narrowing, right 05/12/2019  . Hyperlipidemia 05/06/2019  . CVA (cerebral vascular accident) ([place]) 05/05/2019  . Internal carotid artery occlusion, bilateral   . Covid-19 Virus not Detected   . Status post total replacement of left hip 08/23/2017  . Unilateral primary osteoarthritis, left hip 05/02/2017  . Status post total replacement of right hip 03/22/2017  . PVCs (premature ventricular contractions) 03/14/2017  . Essential hypertension 03/14/2017  . Unilateral primary osteoarthritis, right hip 03/08/2017  . Bilateral hip pain 10/15/2016  . Abnormality of gait 10/15/2016   Deneise Lever, [place], [place] 07/31/2019, 9:00 AM  [place]  [place], [name], [address] Phone: [phone]   Fax:  [phone]   Name: Kiro Stemler MRN: [id number] Date of Birth: 03/06/1951

## 2019-08-03 ENCOUNTER — Encounter: Payer: Commercial Managed Care - PPO | Admitting: Physical Medicine & Rehabilitation

## 2019-08-04 ENCOUNTER — Ambulatory Visit: Payer: Commercial Managed Care - PPO | Admitting: Speech Pathology

## 2019-08-04 ENCOUNTER — Ambulatory Visit (HOSPITAL_COMMUNITY)
Admission: RE | Admit: 2019-08-04 | Discharge: 2019-08-04 | Disposition: A | Discharge status: Home / Self Care | Payer: Commercial Managed Care - PPO | Source: Ambulatory Visit | Attending: Student | Admitting: Student

## 2019-08-04 ENCOUNTER — Other Ambulatory Visit: Payer: Self-pay

## 2019-08-04 ENCOUNTER — Encounter: Payer: Self-pay | Admitting: Physical Therapy

## 2019-08-04 ENCOUNTER — Ambulatory Visit: Payer: Commercial Managed Care - PPO | Admitting: Occupational Therapy

## 2019-08-04 ENCOUNTER — Ambulatory Visit: Payer: Commercial Managed Care - PPO | Admitting: Physical Therapy

## 2019-08-04 DIAGNOSIS — I6502 Occlusion and stenosis of left vertebral artery: Secondary | ICD-10-CM

## 2019-08-04 DIAGNOSIS — R2689 Other abnormalities of gait and mobility: Secondary | ICD-10-CM | POA: Diagnosis not present

## 2019-08-04 DIAGNOSIS — R41841 Cognitive communication deficit: Secondary | ICD-10-CM

## 2019-08-04 DIAGNOSIS — M6281 Muscle weakness (generalized): Secondary | ICD-10-CM

## 2019-08-04 DIAGNOSIS — R269 Unspecified abnormalities of gait and mobility: Secondary | ICD-10-CM

## 2019-08-04 DIAGNOSIS — R2681 Unsteadiness on feet: Secondary | ICD-10-CM

## 2019-08-04 DIAGNOSIS — R4701 Aphasia: Secondary | ICD-10-CM

## 2019-08-04 NOTE — Therapy (Signed)
[facility] [address] [place], [name], [address] Phone: [phone]   Fax:  [phone]  Physical Therapy Treatment  Patient Details  Name: Jose Kaufman MRN: [id number] Date of Birth: [birth year] Referring Provider (PT): Leonie Man   Encounter Date: 08/04/2019  CLINIC OPERATION CHANGES: Outpatient Neuro Rehab is open at lower capacity following universal masking, social distancing, and patient screening.  The patient's COVID risk of complications score is 3.   PT End of Session - 08/04/19 [id number]    Visit Number  5    Number of Visits  9    Authorization Type  UME; 30 VL (PT/OT/aquatic counts as one visit on the same day HARD MAX); speech is separate 30 VL    Authorization - Visit Number  4    Authorization - Number of Visits  15    PT Start Time  0805    PT Stop Time  0843    PT Time Calculation (min)  38 min    Equipment Utilized During Treatment  Gait belt    Activity Tolerance  Patient tolerated treatment well    Behavior During Therapy  WFL for tasks assessed/performed       Past Medical History:  Diagnosis Date  . Abnormality of gait 10/15/2016  . Arthritis   . Dysrhythmia    hx of  . Essential hypertension 03/14/2017  . PVCs (premature ventricular contractions) 03/14/2017  . Skin cancer    Skin  . Stroke [facility])    Mini strokes . 06/25/2019  Expressive aphasia - improving  . Wears glasses     Past Surgical History:  Procedure Laterality Date  . COLONOSCOPY    . IR ANGIO INTRA EXTRACRAN SEL COM CAROTID INNOMINATE BILAT MOD SED  05/07/2019  . IR ANGIO INTRA EXTRACRAN SEL COM CAROTID INNOMINATE BILAT MOD SED  07/20/2019  . IR ANGIO VERTEBRAL SEL SUBCLAVIAN INNOMINATE BILAT MOD SED  05/07/2019  . IR ANGIO VERTEBRAL SEL SUBCLAVIAN INNOMINATE UNI R MOD SED  07/20/2019  . IR PATIENT EVAL TECH 0-60 MINS  06/08/2019  . IR TRANSCATH EXCRAN VERT OR CAR A STENT  05/12/2019  . IR TRANSCATH EXCRAN VERT OR CAR A STENT   07/20/2019  . IR US GUIDE VASC ACCESS RIGHT  05/12/2019  . RADIOLOGY WITH ANESTHESIA N/A 05/08/2019   Procedure: RADIOLOGY WITH ANESTHESIA;  Surgeon: [name], MD;  Location: [place];  Service: Radiology;  Laterality: N/A;  . RADIOLOGY WITH ANESTHESIA N/A 05/12/2019   Procedure: carotid stenting;  Surgeon: [name], MD;  Location: [place];  Service: Radiology;  Laterality: N/A;  . RADIOLOGY WITH ANESTHESIA N/A 06/08/2019   Procedure: STENT PLACEMENT;  Surgeon: [name], MD;  Location: [place];  Service: Radiology;  Laterality: N/A;  . RADIOLOGY WITH ANESTHESIA N/A 07/20/2019   Procedure: RADIOLOGY WITH ANESTHESIA STENTING;  Surgeon: [name], MD;  Location: [place];  Service: Radiology;  Laterality: N/A;  . SKIN CANCER EXCISION    . TOTAL HIP ARTHROPLASTY Right 03/22/2017   Procedure: RIGHT TOTAL HIP ARTHROPLASTY ANTERIOR APPROACH;  Surgeon: Mcarthur Rossetti, MD;  Location: [facility];  Service: Orthopedics;  Laterality: Right;  . TOTAL HIP ARTHROPLASTY Left 08/23/2017   Procedure: LEFT TOTAL HIP ARTHROPLASTY ANTERIOR APPROACH;  Surgeon: Mcarthur Rossetti, MD;  Location: [facility];  Service: Orthopedics;  Laterality: Left;  . WISDOM TOOTH EXTRACTION      There were no vitals filed for this visit.  Subjective Assessment - 08/04/19 0807    Subjective  No changes, no falls.    Patient Stated Goals  Pt's goal for therapy is to walk better.    Currently in Pain?  No/denies                       [facility] Adult PT Treatment/Exercise - 08/04/19 0001      Ambulation/Gait   Ambulation/Gait  Yes    Ambulation/Gait Assistance  6: Modified independent (Device/Increase time)    Ambulation Distance (Feet)  345 Feet   200 ft with environmental scanning   Assistive device  None    Gait Pattern  Step-through pattern;Decreased arm swing - right;Decreased arm swing - left;Shuffle    Ambulation Surface  Level;Indoor    Pre-Gait Activities  Attempted floor ladder  negotiation for increased step length, pt requiring mod assist for balance to increase step length and clear the foot ladder rungs.    Gait Comments  Gait with environmental scanning-pt stops to look for requested objects.  With cues for just head turns and nods with gait, pt is able to perform with fast frequency of head motions.  Practiced short distance gait (20 ft) and turns to change directions, 6 reps, with cues for slowed, rocking turns.      High Level Balance   High Level Balance Activities  Side stepping;Backward walking    High Level Balance Comments  Along counter, forward/back walking x 5 reps, cues in backwards direction for equal step length; sidestepping x 3 reps R and L, cues for increased step length; heel/toe raises x 10 reps 2 sets, 3 sec hold      Knee/Hip Exercises: Standing   Forward Step Up  Right;Left;1 set;10 reps;Hand Hold: 2;Step Height: 6"   single leg step up, 3 sec hold   Other Standing Knee Exercises  Alternating step taps 20 reps at 6" step with UE support; then forward step taps, then hip/knee extension (runner's stretch position) x 10 reps each side, with UE support      Knee/Hip Exercises: Seated   Other Seated Knee/Hip Exercises  Sit to stand x 5 reps standing on cushion   Cushion for upright posture in standing   Sit to Sand  2 sets;without UE support;15 reps   from 20", 18" with cues for upright stand and hold 3 sec                  PT Long Term Goals - 07/13/19 1136      PT LONG TERM GOAL #1   Title  Pt will be independent with HEP for improved balance, strength, and gait.  TARGET 08/21/2019 (delayed due to pt having stent procedure 07/20/2019)    Time  4    Period  Weeks    Status  New    Target Date  08/21/19      PT LONG TERM GOAL #2   Title  Pt will improve 5x sit<>stand to less than or equal to 11.5 sec for improved functional strength.    Time  4    Period  Weeks    Status  New    Target Date  08/21/19      PT LONG TERM GOAL  #3   Title  Pt will improve TUG score to less than or equal to 13.5 seconds for decreased fall risk.    Time  4    Period  Weeks    Status  New    Target Date  08/21/19  PT LONG TERM GOAL #4   Title  Pt will improve DGI score to at least 15/24 for decreased fall risk.    Time  4    Period  Weeks    Status  New    Target Date  08/21/19      PT LONG TERM GOAL #5   Title  Pt will verbalize understanding of fall prevention in home environment.    Time  4    Period  Weeks    Status  New    Target Date  08/21/19            Plan - 08/04/19 0850    Clinical Impression Statement  Focus of skilled PT today:  functional strengthening, lower extremity flexibility and gait training, focusing on increased step length, environmental scanning, and turns.  With trial of floor ladder, pt has significant diffculty clearing rungs of ladder; better ability for foot clearance with cues for slowed pace.  Pt will continue to benefit from skilled PT to address strength, balance, dynamic gait for improved mobility.    Personal Factors and Comorbidities  Comorbidity 2    Comorbidities  Hx of OA, hx of bilateral hip replacements    Examination-Activity Limitations  Locomotion Level;Stairs    Examination-Participation Restrictions  Community Activity    Stability/Clinical Decision Making  Stable/Uncomplicated    Rehab Potential  Good    PT Frequency  2x / week    PT Duration  4 weeks   plus eval   PT Treatment/Interventions  ADLs/Self Care Home Management;Gait training;Stair training;Functional mobility training;Therapeutic activities;Therapeutic exercise;Balance training;Neuromuscular re-education;Patient/family education    PT Next Visit Plan  dynamic balance, sit<>stand, functional lower extremity strengthenining; gait training for improved initiation of gait and turns; particularly head turns for environmental scanning, start/stops/turns with gait    PT Home Exercise Plan  Access Code: [email or web address]     Consulted and Agree with Plan of Care  Patient;Family member/caregiver    Family Member Consulted  brother       Patient will benefit from skilled therapeutic intervention in order to improve the following deficits and impairments:  Abnormal gait, Difficulty walking, Decreased balance, Decreased mobility, Decreased strength  Visit Diagnosis: Muscle weakness (generalized)  Unsteadiness on feet  Abnormality of gait     Problem List Patient Active Problem List   Diagnosis Date Noted  . Vertebral artery stenosis 07/21/2019  . Vertebral artery narrowing, left 07/20/2019  . Non-fluent aphasia   . Acute bilateral cerebral infarction in a watershed distribution [facility]) 05/14/2019  . Vertebral artery narrowing, right 05/12/2019  . Hyperlipidemia 05/06/2019  . CVA (cerebral vascular accident) ([place]) 05/05/2019  . Internal carotid artery occlusion, bilateral   . Covid-19 Virus not Detected   . Status post total replacement of left hip 08/23/2017  . Unilateral primary osteoarthritis, left hip 05/02/2017  . Status post total replacement of right hip 03/22/2017  . PVCs (premature ventricular contractions) 03/14/2017  . Essential hypertension 03/14/2017  . Unilateral primary osteoarthritis, right hip 03/08/2017  . Bilateral hip pain 10/15/2016  . Abnormality of gait 10/15/2016    MARRIOTT,AMY W. 08/04/2019, 8:55 AM  Frazier Butt., PT   [facility] [address] [place], [name], [address] Phone: [phone]   Fax:  [phone]  Name: Lynch Guckert MRN: [id number] Date of Birth: 07/07/1951

## 2019-08-04 NOTE — Therapy (Signed)
[facility] [address] [place], [name], [address] Phone: [phone]   Fax:  [phone]  Speech Language Pathology Treatment  Patient Details  Name: Jose Kaufman MRN: [id number] Date of Birth: May 08, 1951 Referring Provider (SLP): Dr.Kirsteins   Encounter Date: 08/04/2019  End of Session - 08/04/19 1043    Visit Number  8    Number of Visits  17    Date for SLP Re-Evaluation  09/26/19    Authorization Type  [facility], 30 visits for OT/PT, 30 separate visits for ST    Authorization - Visit Number  7    Authorization - Number of Visits  30    SLP Start Time  0847    SLP Stop Time   0931    SLP Time Calculation (min)  44 min    Activity Tolerance  Patient tolerated treatment well       Past Medical History:  Diagnosis Date  . Abnormality of gait 10/15/2016  . Arthritis   . Dysrhythmia    hx of  . Essential hypertension 03/14/2017  . PVCs (premature ventricular contractions) 03/14/2017  . Skin cancer    Skin  . Stroke [facility])    Mini strokes . 06/25/2019  Expressive aphasia - improving  . Wears glasses     Past Surgical History:  Procedure Laterality Date  . COLONOSCOPY    . IR ANGIO INTRA EXTRACRAN SEL COM CAROTID INNOMINATE BILAT MOD SED  05/07/2019  . IR ANGIO INTRA EXTRACRAN SEL COM CAROTID INNOMINATE BILAT MOD SED  07/20/2019  . IR ANGIO VERTEBRAL SEL SUBCLAVIAN INNOMINATE BILAT MOD SED  05/07/2019  . IR ANGIO VERTEBRAL SEL SUBCLAVIAN INNOMINATE UNI R MOD SED  07/20/2019  . IR PATIENT EVAL TECH 0-60 MINS  06/08/2019  . IR TRANSCATH EXCRAN VERT OR CAR A STENT  05/12/2019  . IR TRANSCATH EXCRAN VERT OR CAR A STENT  07/20/2019  . IR US GUIDE VASC ACCESS RIGHT  05/12/2019  . RADIOLOGY WITH ANESTHESIA N/A 05/08/2019   Procedure: RADIOLOGY WITH ANESTHESIA;  Surgeon: [name], MD;  Location: [place];  Service: Radiology;  Laterality: N/A;  . RADIOLOGY WITH ANESTHESIA N/A 05/12/2019   Procedure: carotid stenting;   Surgeon: [name], MD;  Location: [place];  Service: Radiology;  Laterality: N/A;  . RADIOLOGY WITH ANESTHESIA N/A 06/08/2019   Procedure: STENT PLACEMENT;  Surgeon: [name], MD;  Location: [place];  Service: Radiology;  Laterality: N/A;  . RADIOLOGY WITH ANESTHESIA N/A 07/20/2019   Procedure: RADIOLOGY WITH ANESTHESIA STENTING;  Surgeon: [name], MD;  Location: [place];  Service: Radiology;  Laterality: N/A;  . SKIN CANCER EXCISION    . TOTAL HIP ARTHROPLASTY Right 03/22/2017   Procedure: RIGHT TOTAL HIP ARTHROPLASTY ANTERIOR APPROACH;  Surgeon: Mcarthur Rossetti, MD;  [facility];  Service: Orthopedics;  Laterality: Right;  . TOTAL HIP ARTHROPLASTY Left 08/23/2017   Procedure: LEFT TOTAL HIP ARTHROPLASTY ANTERIOR APPROACH;  Surgeon: Mcarthur Rossetti, MD;  [facility];  Service: Orthopedics;  Laterality: Left;  . WISDOM TOOTH EXTRACTION      There were no vitals filed for this visit.  Subjective Assessment - 08/04/19 0848    Subjective  Pt's neuro f/u was rescheduled for Friday.    Patient is accompained by:  Family member    Currently in Pain?  No/denies            ADULT SLP TREATMENT - 08/04/19 1038      General Information  Behavior/Cognition  Alert;Cooperative      Treatment Provided   Treatment provided  Cognitive-Linquistic      Pain Assessment   Pain Assessment  No/denies pain      Cognitive-Linquistic Treatment   Treatment focused on  Cognition    Skilled Treatment  SLP worked with pt on intellectual/emergent awareness in mod complex tasks (reasoning, attention to detail). Pt reported needing assistance from brother for one homework task; another (word rebuses) was incorrect. Pt with slow processing and required usual cues from SLP to recognize errors. In deductive reasoning task, pt required occasional mod cues for reasoning, error awareness. After completing the task, pt told SLP, "It took me a while." SLP agreed, and  told pt that longer-than-anticipated time in this task is due to slow processing speed.       Assessment / Recommendations / Plan   Plan  Continue with current plan of care      Progression Toward Goals   Progression toward goals  Progressing toward goals         SLP Short Term Goals - 08/04/19 1038      SLP SHORT TERM GOAL #1   Title  Pt will maintain selective attention for 15 minutes to a functional task (such as meal planning, billpaying) a moderately distracting environment x3 visits.    Baseline  06/25/19, 07/07/19, 07/09/19    Time  2    Period  Weeks    Status  Achieved      SLP SHORT TERM GOAL #2   Title  Pt will complete further assessment of language, including reading/writing, with goals added as needed.    Baseline  06/22/19    Time  2    Period  Weeks    Status  Achieved      SLP SHORT TERM GOAL #3   Title  Pt will tell SLP 3 cognitive deficits.    Time  1    Period  Weeks    Status  On-going      SLP SHORT TERM GOAL #4   Title  Pt will utilize compensations for anomia in simple conversation with rare min A over 2 sessions    Baseline  07/28/19 07/30/19    Time  2    Period  Weeks    Status  Achieved   simple conversation [facility]     SLP SHORT TERM GOAL #5   Title  Pt will write simple sentences with extended time and occasional min A.    Baseline  07/28/19 07/30/19    Time  2    Period  Weeks    Status  Achieved       SLP Long Term Goals - 08/04/19 1039      SLP LONG TERM GOAL #1   Title  Pt will use external aids/notebook to manage finances, schedule, meal-planning x3 sessions.    Baseline  07/28/19, 08/04/19    Time  5    Period  Weeks    Status  On-going      SLP LONG TERM GOAL #2   Title  Pt will correct 75% of errors in functional therapy tasks with rare min A for double checking.    Time  5    Period  Weeks    Status  On-going      SLP LONG TERM GOAL #3   Title  Pt will use compensations functionally (slowed rate, preplanning) in phone call  scenarios x3 sessions.    Status  Deferred      SLP LONG TERM GOAL #4   Title  Pt will utilize compensations for aphasia over 15 minute moderately complex conversation with rare min A over 2 sessions    Time  5    Period  Weeks    Status  On-going       Plan - 08/04/19 1044    Clinical Impression Statement  Pt presents with improving cognitive communication and expressive language deficits, which appear more in range of mild today. Mild stuttering, hesistations in mod complex conversation today. Slow processing persists in all tasks, as well as decreased awareness of deficits and functional impacts. Discussion today with pt and brother re: how deficit areas may affect pt's ability to drive safely. See skilled intervention for details. I recommend skilled ST to address cognitive and language impairments in order to improve safety, independence and for possible return to work.    Speech Therapy Frequency  2x / week    Duration  --   8 weeks or 17 visits   Treatment/Interventions  Compensatory strategies;Patient/family education;Functional tasks;Cueing hierarchy;Environmental controls;Cognitive reorganization;Multimodal communcation approach;Internal/external aids;Compensatory techniques;Language facilitation;SLP instruction and feedback    Potential to Achieve Goals  Good    Potential Considerations  Ability to learn/carryover information    Consulted and Agree with Plan of Care  Patient;Family member/caregiver    Family Member Consulted  brother       Patient will benefit from skilled therapeutic intervention in order to improve the following deficits and impairments:   Cognitive communication deficit  Aphasia    Problem List Patient Active Problem List   Diagnosis Date Noted  . Vertebral artery stenosis 07/21/2019  . Vertebral artery narrowing, left 07/20/2019  . Non-fluent aphasia   . Acute bilateral cerebral infarction in a watershed distribution [facility]) 05/14/2019  . Vertebral  artery narrowing, right 05/12/2019  . Hyperlipidemia 05/06/2019  . CVA (cerebral vascular accident) ([place]) 05/05/2019  . Internal carotid artery occlusion, bilateral   . Covid-19 Virus not Detected   . Status post total replacement of left hip 08/23/2017  . Unilateral primary osteoarthritis, left hip 05/02/2017  . Status post total replacement of right hip 03/22/2017  . PVCs (premature ventricular contractions) 03/14/2017  . Essential hypertension 03/14/2017  . Unilateral primary osteoarthritis, right hip 03/08/2017  . Bilateral hip pain 10/15/2016  . Abnormality of gait 10/15/2016   Deneise Lever, [place], [place] 08/04/2019, 10:44 AM  [facility] [address] [place], [name], [address] Phone: [phone]   Fax:  [phone]   Name: Kaide Klebe MRN: [id number] Date of Birth: 04-14-1951

## 2019-08-04 NOTE — Progress Notes (Signed)
Chief Complaint: Patient was seen in consultation today for left VA stenosis at the origin s/p revascularization.  Referring Physician(s): Garvin Fila  Supervising Physician: [name]  Patient Status: [facility] - Out-pt  History of Present Illness: Jose Kaufman is a 68 y.o. male with a past medical history as below, with pertinent past medical history including hypertension, PVCs, CVA 04/2019, and arthritis. He is known to [facility] and has been followed by Dr. Estanislado Pandy since 04/2019. He first presented to our department at the request of Dr. Leonie Man while patient was admitted to [facility] (05/05/2019 to 05/14/2019) for management of an acute CVA (left frontal and parietal brain infarcts). He underwent an image-guided diagnostic cerebral arteriogram 05/07/2019 by Dr. Estanislado Pandy which revealed a proximal left ICA occlusion, severe pre-occlusive proximal right ICA stenosis, and severe pre-occlusive bilateral VA stenosis at the origin- findings thought to be cause of acute CVA. Following this while patient was still admitted, he underwent an image-guided cerebral arteriogram with revascularization of right VA stenosis at the origin using stent assisted angioplasty 05/12/2019 by Dr. Estanislado Pandy. He was discharged to [facility] 05/14/2019 and discharged home 05/23/2019. He then presented to [facility] on an outpatient basis for an image-guided cerebral arteriogram with revascularization of left VA stenosis at the origin using stent assisted angioplasty 07/20/2019 by Dr. Estanislado Pandy. He was discharged home 07/21/2019 in stable condition.  Diagnostic cerebral arteriogram 05/07/2019: 1. Angiographically occluded left internal carotid artery extracranially with partial reconstitution from the external carotid artery branches as described above. 2. Severe high-grade stenosis of the right internal carotid artery proximally with delayed angiographic string sign being contiguous with the proximal cavernous segment of the right internal carotid  artery. 3. Severe high-grade pre occlusive stenosis of both vertebral artery origins.  Patient presents today for follow-up regarding his recent procedure 07/20/2019. Patient awake and alert sitting in chair. Accompanied by his brother. Complains of intermittent dizziness when changing positions. States that this usually occurs in AM when he is getting up from bed, and states dizziness lasts for a few seconds. Denies weakness or N/V associated with dizziness. Denies headache, weakness, numbness/tingling, vision changes, hearing changes, tinnitus, or speech difficulty.  Patient is currently taking Brilinta 90 mg twice daily and Aspirin 81 mg once daily.   Past Medical History:  Diagnosis Date   Abnormality of gait 10/15/2016   Arthritis    Dysrhythmia    hx of   Essential hypertension 03/14/2017   PVCs (premature ventricular contractions) 03/14/2017   Skin cancer    Skin   Stroke ([place])    Mini strokes . 06/25/2019  Expressive aphasia - improving   Wears glasses     Past Surgical History:  Procedure Laterality Date   COLONOSCOPY     IR ANGIO INTRA EXTRACRAN SEL COM CAROTID INNOMINATE BILAT MOD SED  05/07/2019   IR ANGIO INTRA EXTRACRAN SEL COM CAROTID INNOMINATE BILAT MOD SED  07/20/2019   IR ANGIO VERTEBRAL SEL SUBCLAVIAN INNOMINATE BILAT MOD SED  05/07/2019   IR ANGIO VERTEBRAL SEL SUBCLAVIAN INNOMINATE UNI R MOD SED  07/20/2019   IR PATIENT EVAL TECH 0-60 MINS  06/08/2019   IR TRANSCATH EXCRAN VERT OR CAR A STENT  05/12/2019   IR TRANSCATH EXCRAN VERT OR CAR A STENT  07/20/2019   IR US GUIDE VASC ACCESS RIGHT  05/12/2019   RADIOLOGY WITH ANESTHESIA N/A 05/08/2019   Procedure: RADIOLOGY WITH ANESTHESIA;  Surgeon: [name], MD;  Location: [place];  Service: Radiology;  Laterality: N/A;   RADIOLOGY WITH  ANESTHESIA N/A 05/12/2019   Procedure: carotid stenting;  Surgeon: [name], MD;  Location: [place];  Service: Radiology;  Laterality: N/A;   RADIOLOGY WITH  ANESTHESIA N/A 06/08/2019   Procedure: STENT PLACEMENT;  Surgeon: [name], MD;  Location: [place];  Service: Radiology;  Laterality: N/A;   RADIOLOGY WITH ANESTHESIA N/A 07/20/2019   Procedure: RADIOLOGY WITH ANESTHESIA STENTING;  Surgeon: [name], MD;  Location: [place];  Service: Radiology;  Laterality: N/A;   SKIN CANCER EXCISION     TOTAL HIP ARTHROPLASTY Right 03/22/2017   Procedure: RIGHT TOTAL HIP ARTHROPLASTY ANTERIOR APPROACH;  Surgeon: Mcarthur Rossetti, MD;  Location: [facility];  Service: Orthopedics;  Laterality: Right;   TOTAL HIP ARTHROPLASTY Left 08/23/2017   Procedure: LEFT TOTAL HIP ARTHROPLASTY ANTERIOR APPROACH;  Surgeon: Mcarthur Rossetti, MD;  Location: [facility];  Service: Orthopedics;  Laterality: Left;   WISDOM TOOTH EXTRACTION      Allergies: Penicillins  Medications: Prior to Admission medications   Medication Sig Start Date End Date Taking? Authorizing Provider  aspirin EC 81 MG tablet Take 81 mg by mouth daily.    [provider]  atorvastatin (LIPITOR) 80 MG tablet Take 1 tablet (80 mg total) by mouth daily at 6 PM. 05/22/19   Love, Ivan Anchors, PA-C  ticagrelor (BRILINTA) 90 MG TABS tablet Take 1 tablet (90 mg total) by mouth 2 (two) times daily. 05/22/19   Love, Ivan Anchors, PA-C     Family History  Problem Relation Age of Onset   Leukemia Mother    Pneumonia Father     Social History   Socioeconomic History   Marital status: Divorced    Spouse name: Not on file   Number of children: 2   Years of education: 12   Highest education level: Not on file  Occupational History   Occupation: [employer]   Financial resource strain: Not on file   Food insecurity    Worry: Not on file    Inability: Not on file   Transportation needs    Medical: Not on file    Non-medical: Not on file  Tobacco Use   Smoking status: Former Smoker    Types: Cigarettes    Quit date: 05/20/1992    Years since  quitting: 27.2   Smokeless tobacco: Never Used  Substance and Sexual Activity   Alcohol use: Not Currently   Drug use: No   Sexual activity: Yes  Lifestyle   Physical activity    Days per week: Not on file    Minutes per session: Not on file   Stress: Not on file  Relationships   Social connections    Talks on phone: Not on file    Gets together: Not on file    Attends religious service: Not on file    Active member of club or organization: Not on file    Attends meetings of clubs or organizations: Not on file    Relationship status: Not on file  Other Topics Concern   Not on file  Social History Narrative   Pt lives at home, divorced   Right-handed   Caffeine: soda daily     Review of Systems: A 12 point ROS discussed and pertinent positives are indicated in the HPI above.  All other systems are negative.  Review of Systems  Constitutional: Negative for chills and fever.  HENT: Negative for hearing loss and tinnitus.   Eyes: Negative for visual disturbance.  Respiratory:  Negative for shortness of breath and wheezing.   Cardiovascular: Negative for chest pain and palpitations.  Gastrointestinal: Negative for nausea and vomiting.  Neurological: Positive for dizziness. Negative for speech difficulty, weakness, numbness and headaches.  Psychiatric/Behavioral: Negative for behavioral problems and confusion.     Physical Exam Constitutional:      General: He is not in acute distress.    Appearance: Normal appearance.  Pulmonary:     Effort: Pulmonary effort is normal. No respiratory distress.  Skin:    General: Skin is warm and dry.  Neurological:     Mental Status: He is alert and oriented to person, place, and time.  Psychiatric:        Mood and Affect: Mood normal.        Behavior: Behavior normal.        Thought Content: Thought content normal.        Judgment: Judgment normal.      Imaging: Ir Danielle Dess Or Car A Stent  Result Date:  07/22/2019 CLINICAL DATA:  History of vertebrobasilar insufficiency with severe extracranial cerebrovascular disease involving the carotid arteries and vertebral arteries. Status post previous right vertebral artery origin stent assisted angioplasty. 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 MEDICATIONS: Heparin 3,500 units IV. Vancomycin 1 g IV antibiotic was administered within 1 hour of the procedure. ANESTHESIA/SEDATION: Mac anesthesia as per the Department of Anesthesiology at [place]:  Isovue 300 approximately 120 mL. FLUOROSCOPY TIME:  Fluoroscopy Time: 26 minutes 30 seconds 1403 mGy). COMPLICATIONS: None immediate. TECHNIQUE: Informed written consent was obtained from the patient after a thorough discussion of the procedural risks, benefits and alternatives. All questions were addressed. Maximal Sterile Barrier Technique was utilized including caps, mask, sterile gowns, sterile gloves, sterile drape, hand hygiene and skin antiseptic. A timeout was performed prior to the initiation of the procedure. The right groin was prepped and draped in the usual sterile fashion. Thereafter using modified Seldinger technique, transfemoral access into the right common femoral artery was obtained without difficulty. Over a 0.035 inch guidewire, a 5 French Pinnacle sheath was inserted. Through this, and also over 0.035 inch guidewire, a 5 [place] JB 1 catheter was advanced to the aortic arch region and selectively positioned in the right common carotid artery, the right subclavian artery, the left common carotid artery and the left vertebral artery. FINDINGS:  The right subclavian arteriogram just proximal to the origin of the right vertebral artery demonstrates wide patency of the previously positioned stent at the origin of the right vertebral artery. There is stenosis of approximately 25-50% with within the stent at the origin of the right vertebral artery. More distally the vessel is seen to opacify to the cranial skull base. Wide patency is seen of the right vertebrobasilar junction and the right posterior-inferior cerebellar artery. The opacified portion of the basilar artery, the posterior cerebral arteries, the superior cerebellar arteries and the anterior-inferior cerebellar arteries is grossly intact into the delayed capillary phase. Inflow of unopacified blood is seen in the mid basilar artery from the contralateral vertebral artery. The right common carotid arteriogram demonstrates the right external carotid artery and its major branches to be widely patent. The right internal  carotid artery at the bulb to the cranial skull base demonstrates a string sign angiographic appearance. This continues to the proximal cavernous segment. There is a brisk revascularization of the caval cavernous and supraclinoid right ICA via the ipsilateral ophthalmic artery from the right external carotid artery branches involving the nasolacrimal region. Also demonstrated is an area of severe focal stenosis of the petrous cavernous junction. Opacification is seen of the right middle cerebral artery and the right anterior cerebral artery. Also demonstrated is cross-filling via the anterior communicating artery of the left anterior cerebral A2 segment and distally. The delayed arterial phase demonstrates an area of hypoperfusion involving the mid frontal cortical and subcortical regions. The left common carotid arteriogram demonstrates the left external carotid artery and its major branches to be widely patent. The left internal carotid artery at the bulb to the cranial skull base  demonstrates an irregular severely narrowed left internal carotid artery opacifying into the cranial skull base. More distally there is reconstitution of the caval cavernous segment in the left internal carotid artery from the branches arising from the internal maxillary artery probably the artery of the foramen of Rotundum. The distal cavernous and the supraclinoid left ICA appear patent. There is flow noted into the left middle cerebral artery and the left anterior cerebral artery. However, these two reconstitute only a partial portion of the these two distributions with the remainder being supplied from the posterior circulation as described below. The left vertebral artery origin again demonstrates severe stenosis. This is the dominant vertebral artery which opacifies to the cranial skull base. Wide patency is seen of the left vertebrobasilar junction and the left posterior-inferior cerebellar artery. The opacified portion of the basilar artery, the posterior cerebral arteries, the superior cerebellar arteries and the anterior-inferior cerebellar arteries is intact. The delayed arterial phase demonstrates retrograde opacification via the P3 leptomeningeal branches of the left anterior cerebral artery retrogradely, and also the parietal, cortical and subcortical branches on the left. Also demonstrated is partial opacification of the right middle cerebral artery and the right anterior cerebral artery distribution from P2 segments of the right posterior cerebral artery. Delayed opacification of the perisylvian branches is noted. ENDOVASCULAR STENT ASSISTED ANGIOPLASTY OF SEVERE HIGH-GRADE STENOSIS OF THE DOMINANT LEFT VERTEBRAL ARTERY AT ITS ORIGIN. A diagnostic JB 1 catheter was then advanced to the distal left subclavian artery distal to the origin of the costocervical trunk and exchanged over 0.035 inch 300 cm Rosen exchange guidewire for a 6 French 80 cm Cook shuttle sheath. The guidewire was removed. Good  aspiration was obtained from the hub of the [facility] shuttle sheath. This was then retrieved whereby the tip was just proximal to the origin of the dominant left vertebral artery at the site of the severe stenosis. Over a 0.014 inch Transend EX soft tip micro guidewire, a rapid transit microcatheter was advanced to the distal cervical left ICA without difficulty. The guidewire was removed. Good aspiration obtained from the hub of the microcatheter. This in turn was then replaced with a 014 inch 300 cm Transend soft tip EX exchange micro guidewire which had a J configuration at the distal aspect. The wire was advanced to the horizontal petrous segment. The microcatheter was removed. A control arteriogram performed through the [facility] shuttle sheath demonstrated straightening of the previously noted origin of the left vertebral artery. Measurements were then performed the vertebral artery in its most normal segment proximally. The length of the proposed stent was then evaluated. It was decided to use a  5 mm x 12 mm coronary balloon resolution ONYX drug-eluting stent. This was retrogradely purged with heparinized saline infusion. Thereafter using the rapid exchange technique, the balloon mounted stent was advanced without difficulty to the site of the severe left vertebral artery stenosis. The proximal and the distal landing zones were then defined with the patient holding his breath. The stent was then deployed by inflating the balloon with a micro inflation syringe device via micro tubing to just over 5 mm where it was maintained for approximately 15 seconds. The balloon was then deflated and retrieved and removed. A control arteriogram performed through the [facility] shuttle sheath in the left vertebral artery proximally demonstrated significantly improved caliber and flow through the stented segment with brisk flow into the posterior fossa. The left vertebrobasilar junction, the basilar artery, the posterior cerebral arteries,  the superior cerebellar arteries and the anterior-inferior cerebellar arteries demonstrated opacification into the capillary and venous phases. No intraluminal filling defects or occlusions were seen. Control arteriograms were then performed at 15 and 30 minutes post stent deployment. These continued to demonstrate excellent apposition proximally and distally with slight protrusion of the stent into the left subclavian artery. No evidence of intraluminal filling defects, or of intra stent luminal irregularities were seen. A final control arteriogram performed through the [place] shuttle sheath just proximal to the stent after removing the exchange micro guidewire continued to demonstrate excellent flow through the entirety of the left vertebrobasilar system without evidence of internal filling defects or of occlusions. The delayed arterial phases continued to demonstrate brisk collateral flow into the anterior and middle cerebral artery distributions bilaterally as described previously. The [place] shuttle sheath was then replaced with a 6 [place] Pinnacle sheath. This in turn was then removed with the successful application of a 6 French Angio-Seal closure device. The patient's ACT was maintained in the region of approximately 200 seconds throughout the procedure. The right groin appeared soft without evidence of hematoma or bleeding. Distal pulses remained Dopplerable in the dorsalis pedis, and the posterior tibial regions bilaterally at the end of procedure. Clinically the patient remained asymptomatic without symptoms of nausea, vomiting visual aberrations or speech difficulties. He was then returned to the neuro ICU for overnight close observations, and also to continue on low-dose IV heparin, and blood pressure monitoring. His overnight stay was unremarkable. The following day the patient's IV heparin was stopped and patient switched to Brilinta 90 mg b.i.d. and 1 baby aspirin. He was eating normally.  Patient ambulated independently. Prior to discharge, he was advised to maintain adequate hydration and to continue taking his antiplatelets and to visit with his primary care physician for routine checkup. He was advised to refrain from driving for 2 weeks, or stooping, bending or lifting weights above 10 pounds for a couple of weeks. Should he develop balance difficulties, vision difficulties, weakness in his legs, or speech difficulties, he was advised to call 911. Patient expressed an understanding and agreement with the above management plan. He was then discharged home under care of a family member. IMPRESSION: Status post stent assisted angioplasty of dominant left vertebral artery origin stenosis without event. PLAN: Return for follow-up in the clinic in approximately 2 weeks time. Electronically Signed   By: [name] M.D.   On: 07/21/2019 11:37   Ir Angio Intra Extracran Sel Com Carotid Innominate Bilat Mod Sed  Result Date: 07/22/2019 CLINICAL DATA:  History of vertebrobasilar insufficiency with severe extracranial cerebrovascular disease involving the carotid arteries and vertebral arteries.  Status post previous right vertebral artery origin stent assisted angioplasty. 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 MEDICATIONS: Heparin 3,500 units IV. Vancomycin 1 g IV antibiotic was administered within 1 hour of the procedure. ANESTHESIA/SEDATION: Mac anesthesia as per the Department of Anesthesiology at [place]:  Isovue 300 approximately 120 mL. FLUOROSCOPY TIME:  Fluoroscopy Time: 26 minutes 30 seconds 1403 mGy). COMPLICATIONS:  None immediate. TECHNIQUE: Informed written consent was obtained from the patient after a thorough discussion of the procedural risks, benefits and alternatives. All questions were addressed. Maximal Sterile Barrier Technique was utilized including caps, mask, sterile gowns, sterile gloves, sterile drape, hand hygiene and skin antiseptic. A timeout was performed prior to the initiation of the procedure. The right groin was prepped and draped in the usual sterile fashion. Thereafter using modified Seldinger technique, transfemoral access into the right common femoral artery was obtained without difficulty. Over a 0.035 inch guidewire, a 5 French Pinnacle sheath was inserted. Through this, and also over 0.035 inch guidewire, a 5 [place] JB 1 catheter was advanced to the aortic arch region and selectively positioned in the right common carotid artery, the right subclavian artery, the left common carotid artery and the left vertebral artery. FINDINGS: The right subclavian arteriogram just proximal to the origin of the right vertebral artery demonstrates wide patency of the previously positioned stent at the origin of the right vertebral artery. There is stenosis of approximately 25-50% with within the stent at the origin of the right vertebral artery. More distally the vessel is seen to opacify to the cranial skull base. Wide patency is seen of the right vertebrobasilar junction and the right posterior-inferior cerebellar artery. The opacified portion of the basilar artery, the posterior cerebral arteries, the superior cerebellar arteries and the anterior-inferior cerebellar arteries is grossly intact into the delayed capillary phase. Inflow of unopacified blood is seen in the mid basilar artery from the contralateral vertebral artery. The right common carotid arteriogram demonstrates the right external carotid artery and its major branches to be widely patent. The right internal carotid artery at the bulb to the cranial  skull base demonstrates a string sign angiographic appearance. This continues to the proximal cavernous segment. There is a brisk revascularization of the caval cavernous and supraclinoid right ICA via the ipsilateral ophthalmic artery from the right external carotid artery branches involving the nasolacrimal region. Also demonstrated is an area of severe focal stenosis of the petrous cavernous junction. Opacification is seen of the right middle cerebral artery and the right anterior cerebral artery. Also demonstrated is cross-filling via the anterior communicating artery of the left anterior cerebral A2 segment and distally. The delayed arterial phase demonstrates an area of hypoperfusion involving the mid frontal cortical and subcortical regions. The left common carotid arteriogram demonstrates the left external carotid artery and its major branches to be widely patent. The left internal carotid artery at the bulb to the cranial skull base demonstrates an irregular severely narrowed left internal carotid artery opacifying into the cranial skull base. More distally there is reconstitution of the caval cavernous segment in the left internal carotid artery from the branches arising from the internal maxillary artery probably the artery of the foramen of  Rotundum. The distal cavernous and the supraclinoid left ICA appear patent. There is flow noted into the left middle cerebral artery and the left anterior cerebral artery. However, these two reconstitute only a partial portion of the these two distributions with the remainder being supplied from the posterior circulation as described below. The left vertebral artery origin again demonstrates severe stenosis. This is the dominant vertebral artery which opacifies to the cranial skull base. Wide patency is seen of the left vertebrobasilar junction and the left posterior-inferior cerebellar artery. The opacified portion of the basilar artery, the posterior cerebral  arteries, the superior cerebellar arteries and the anterior-inferior cerebellar arteries is intact. The delayed arterial phase demonstrates retrograde opacification via the P3 leptomeningeal branches of the left anterior cerebral artery retrogradely, and also the parietal, cortical and subcortical branches on the left. Also demonstrated is partial opacification of the right middle cerebral artery and the right anterior cerebral artery distribution from P2 segments of the right posterior cerebral artery. Delayed opacification of the perisylvian branches is noted. ENDOVASCULAR STENT ASSISTED ANGIOPLASTY OF SEVERE HIGH-GRADE STENOSIS OF THE DOMINANT LEFT VERTEBRAL ARTERY AT ITS ORIGIN. A diagnostic JB 1 catheter was then advanced to the distal left subclavian artery distal to the origin of the costocervical trunk and exchanged over 0.035 inch 300 cm Rosen exchange guidewire for a 6 French 80 cm Cook shuttle sheath. The guidewire was removed. Good aspiration was obtained from the hub of the [facility] shuttle sheath. This was then retrieved whereby the tip was just proximal to the origin of the dominant left vertebral artery at the site of the severe stenosis. Over a 0.014 inch Transend EX soft tip micro guidewire, a rapid transit microcatheter was advanced to the distal cervical left ICA without difficulty. The guidewire was removed. Good aspiration obtained from the hub of the microcatheter. This in turn was then replaced with a 014 inch 300 cm Transend soft tip EX exchange micro guidewire which had a J configuration at the distal aspect. The wire was advanced to the horizontal petrous segment. The microcatheter was removed. A control arteriogram performed through the [facility] shuttle sheath demonstrated straightening of the previously noted origin of the left vertebral artery. Measurements were then performed the vertebral artery in its most normal segment proximally. The length of the proposed stent was then evaluated. It was  decided to use a 5 mm x 12 mm coronary balloon resolution ONYX drug-eluting stent. This was retrogradely purged with heparinized saline infusion. Thereafter using the rapid exchange technique, the balloon mounted stent was advanced without difficulty to the site of the severe left vertebral artery stenosis. The proximal and the distal landing zones were then defined with the patient holding his breath. The stent was then deployed by inflating the balloon with a micro inflation syringe device via micro tubing to just over 5 mm where it was maintained for approximately 15 seconds. The balloon was then deflated and retrieved and removed. A control arteriogram performed through the [facility] shuttle sheath in the left vertebral artery proximally demonstrated significantly improved caliber and flow through the stented segment with brisk flow into the posterior fossa. The left vertebrobasilar junction, the basilar artery, the posterior cerebral arteries, the superior cerebellar arteries and the anterior-inferior cerebellar arteries demonstrated opacification into the capillary and venous phases. No intraluminal filling defects or occlusions were seen. Control arteriograms were then performed at 15 and 30 minutes post stent deployment. These continued to demonstrate excellent apposition proximally and distally with slight protrusion of the stent  into the left subclavian artery. No evidence of intraluminal filling defects, or of intra stent luminal irregularities were seen. A final control arteriogram performed through the [place] shuttle sheath just proximal to the stent after removing the exchange micro guidewire continued to demonstrate excellent flow through the entirety of the left vertebrobasilar system without evidence of internal filling defects or of occlusions. The delayed arterial phases continued to demonstrate brisk collateral flow into the anterior and middle cerebral artery distributions bilaterally as  described previously. The [place] shuttle sheath was then replaced with a 6 [place] Pinnacle sheath. This in turn was then removed with the successful application of a 6 French Angio-Seal closure device. The patient's ACT was maintained in the region of approximately 200 seconds throughout the procedure. The right groin appeared soft without evidence of hematoma or bleeding. Distal pulses remained Dopplerable in the dorsalis pedis, and the posterior tibial regions bilaterally at the end of procedure. Clinically the patient remained asymptomatic without symptoms of nausea, vomiting visual aberrations or speech difficulties. He was then returned to the neuro ICU for overnight close observations, and also to continue on low-dose IV heparin, and blood pressure monitoring. His overnight stay was unremarkable. The following day the patient's IV heparin was stopped and patient switched to Brilinta 90 mg b.i.d. and 1 baby aspirin. He was eating normally. Patient ambulated independently. Prior to discharge, he was advised to maintain adequate hydration and to continue taking his antiplatelets and to visit with his primary care physician for routine checkup. He was advised to refrain from driving for 2 weeks, or stooping, bending or lifting weights above 10 pounds for a couple of weeks. Should he develop balance difficulties, vision difficulties, weakness in his legs, or speech difficulties, he was advised to call 911. Patient expressed an understanding and agreement with the above management plan. He was then discharged home under care of a family member. IMPRESSION: Status post stent assisted angioplasty of dominant left vertebral artery origin stenosis without event. PLAN: Return for follow-up in the clinic in approximately 2 weeks time. Electronically Signed   By: [name] M.D.   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 Status post previous right vertebral artery origin stent assisted angioplasty. 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 MEDICATIONS: Heparin 3,500 units IV. Vancomycin 1 g IV antibiotic was administered within 1 hour of the procedure. ANESTHESIA/SEDATION: Mac anesthesia as per the Department of Anesthesiology at [place]:  Isovue 300 approximately 120 mL. FLUOROSCOPY TIME:  Fluoroscopy Time: 26 minutes 30 seconds 1403 mGy). COMPLICATIONS: None immediate. TECHNIQUE: Informed written consent was obtained from the patient after a thorough discussion of the procedural risks, benefits and alternatives. All questions were addressed. Maximal Sterile Barrier Technique was utilized including caps, mask, sterile gowns, sterile gloves, sterile drape, hand hygiene and skin antiseptic. A timeout was performed prior to the initiation of the procedure. The right groin was prepped and draped in the usual sterile fashion. Thereafter using modified Seldinger technique, transfemoral access into the  right common femoral artery was obtained without difficulty. Over a 0.035 inch guidewire, a 5 French Pinnacle sheath was inserted. Through this, and also over 0.035 inch guidewire, a 5 [place] JB 1 catheter was advanced to the aortic arch region and selectively positioned in the right common carotid artery, the right subclavian artery, the left common carotid artery and the left vertebral artery. FINDINGS: The right subclavian arteriogram just  proximal to the origin of the right vertebral artery demonstrates wide patency of the previously positioned stent at the origin of the right vertebral artery. There is stenosis of approximately 25-50% with within the stent at the origin of the right vertebral artery. More distally the vessel is seen to opacify to the cranial skull base. Wide patency is seen of the right vertebrobasilar junction and the right posterior-inferior cerebellar artery. The opacified portion of the basilar artery, the posterior cerebral arteries, the superior cerebellar arteries and the anterior-inferior cerebellar arteries is grossly intact into the delayed capillary phase. Inflow of unopacified blood is seen in the mid basilar artery from the contralateral vertebral artery. The right common carotid arteriogram demonstrates the right external carotid artery and its major branches to be widely patent. The right internal carotid artery at the bulb to the cranial skull base demonstrates a string sign angiographic appearance. This continues to the proximal cavernous segment. There is a brisk revascularization of the caval cavernous and supraclinoid right ICA via the ipsilateral ophthalmic artery from the right external carotid artery branches involving the nasolacrimal region. Also demonstrated is an area of severe focal stenosis of the petrous cavernous junction. Opacification is seen of the right middle cerebral artery and the right anterior cerebral artery. Also demonstrated is cross-filling via the anterior communicating artery of the left anterior cerebral A2 segment and distally. The delayed arterial phase demonstrates an area of hypoperfusion involving the mid frontal cortical and subcortical regions. The left common carotid arteriogram demonstrates the left external carotid artery and its major branches to be widely patent. The left internal carotid artery at the bulb to the cranial skull base demonstrates an irregular severely  narrowed left internal carotid artery opacifying into the cranial skull base. More distally there is reconstitution of the caval cavernous segment in the left internal carotid artery from the branches arising from the internal maxillary artery probably the artery of the foramen of Rotundum. The distal cavernous and the supraclinoid left ICA appear patent. There is flow noted into the left middle cerebral artery and the left anterior cerebral artery. However, these two reconstitute only a partial portion of the these two distributions with the remainder being supplied from the posterior circulation as described below. The left vertebral artery origin again demonstrates severe stenosis. This is the dominant vertebral artery which opacifies to the cranial skull base. Wide patency is seen of the left vertebrobasilar junction and the left posterior-inferior cerebellar artery. The opacified portion of the basilar artery, the posterior cerebral arteries, the superior cerebellar arteries and the anterior-inferior cerebellar arteries is intact. The delayed arterial phase demonstrates retrograde opacification via the P3 leptomeningeal branches of the left anterior cerebral artery retrogradely, and also the parietal, cortical and subcortical branches on the left. Also demonstrated is partial opacification of the right middle cerebral artery and the right anterior cerebral artery distribution from P2 segments of the right posterior cerebral artery. Delayed opacification of the perisylvian branches is noted. ENDOVASCULAR STENT ASSISTED ANGIOPLASTY OF SEVERE HIGH-GRADE STENOSIS OF THE DOMINANT LEFT VERTEBRAL ARTERY AT ITS ORIGIN. A diagnostic JB  1 catheter was then advanced to the distal left subclavian artery distal to the origin of the costocervical trunk and exchanged over 0.035 inch 300 cm Rosen exchange guidewire for a 6 French 80 cm Cook shuttle sheath. The guidewire was removed. Good aspiration was obtained from the hub of  the [facility] shuttle sheath. This was then retrieved whereby the tip was just proximal to the origin of the dominant left vertebral artery at the site of the severe stenosis. Over a 0.014 inch Transend EX soft tip micro guidewire, a rapid transit microcatheter was advanced to the distal cervical left ICA without difficulty. The guidewire was removed. Good aspiration obtained from the hub of the microcatheter. This in turn was then replaced with a 014 inch 300 cm Transend soft tip EX exchange micro guidewire which had a J configuration at the distal aspect. The wire was advanced to the horizontal petrous segment. The microcatheter was removed. A control arteriogram performed through the [facility] shuttle sheath demonstrated straightening of the previously noted origin of the left vertebral artery. Measurements were then performed the vertebral artery in its most normal segment proximally. The length of the proposed stent was then evaluated. It was decided to use a 5 mm x 12 mm coronary balloon resolution ONYX drug-eluting stent. This was retrogradely purged with heparinized saline infusion. Thereafter using the rapid exchange technique, the balloon mounted stent was advanced without difficulty to the site of the severe left vertebral artery stenosis. The proximal and the distal landing zones were then defined with the patient holding his breath. The stent was then deployed by inflating the balloon with a micro inflation syringe device via micro tubing to just over 5 mm where it was maintained for approximately 15 seconds. The balloon was then deflated and retrieved and removed. A control arteriogram performed through the [facility] shuttle sheath in the left vertebral artery proximally demonstrated significantly improved caliber and flow through the stented segment with brisk flow into the posterior fossa. The left vertebrobasilar junction, the basilar artery, the posterior cerebral arteries, the superior cerebellar arteries and  the anterior-inferior cerebellar arteries demonstrated opacification into the capillary and venous phases. No intraluminal filling defects or occlusions were seen. Control arteriograms were then performed at 15 and 30 minutes post stent deployment. These continued to demonstrate excellent apposition proximally and distally with slight protrusion of the stent into the left subclavian artery. No evidence of intraluminal filling defects, or of intra stent luminal irregularities were seen. A final control arteriogram performed through the [place] shuttle sheath just proximal to the stent after removing the exchange micro guidewire continued to demonstrate excellent flow through the entirety of the left vertebrobasilar system without evidence of internal filling defects or of occlusions. The delayed arterial phases continued to demonstrate brisk collateral flow into the anterior and middle cerebral artery distributions bilaterally as described previously. The [place] shuttle sheath was then replaced with a 6 [place] Pinnacle sheath. This in turn was then removed with the successful application of a 6 French Angio-Seal closure device. The patient's ACT was maintained in the region of approximately 200 seconds throughout the procedure. The right groin appeared soft without evidence of hematoma or bleeding. Distal pulses remained Dopplerable in the dorsalis pedis, and the posterior tibial regions bilaterally at the end of procedure. Clinically the patient remained asymptomatic without symptoms of nausea, vomiting visual aberrations or speech difficulties. He was then returned to the neuro ICU for overnight close observations, and also to continue on low-dose IV heparin,  and blood pressure monitoring. His overnight stay was unremarkable. The following day the patient's IV heparin was stopped and patient switched to Brilinta 90 mg b.i.d. and 1 baby aspirin. He was eating normally. Patient ambulated independently. Prior to  discharge, he was advised to maintain adequate hydration and to continue taking his antiplatelets and to visit with his primary care physician for routine checkup. He was advised to refrain from driving for 2 weeks, or stooping, bending or lifting weights above 10 pounds for a couple of weeks. Should he develop balance difficulties, vision difficulties, weakness in his legs, or speech difficulties, he was advised to call 911. Patient expressed an understanding and agreement with the above management plan. He was then discharged home under care of a family member. IMPRESSION: Status post stent assisted angioplasty of dominant left vertebral artery origin stenosis without event. PLAN: Return for follow-up in the clinic in approximately 2 weeks time. Electronically Signed   By: [name] M.D.   On: 07/21/2019 11:37    Labs:  CBC: Recent Labs    05/15/19 0640 06/08/19 0549 07/20/19 0544 07/21/19 0525  WBC 8.9 7.0 7.8 7.7  HGB 14.5 13.9 16.3 13.5  HCT 42.4 42.2 50.0 41.4  PLT 225 193 196 139*    COAGS: Recent Labs    05/05/19 0825 06/08/19 0549 07/20/19 0544  INR 1.1 1.2 1.1  APTT 34 35  --     BMP: Recent Labs    05/14/19 1501 06/08/19 0549 07/20/19 0544 07/21/19 0525  NA 139 140 138 137  K 3.5 3.5 3.8 3.8  CL 107 112* 107 108  CO2 24 21* 20* 19*  GLUCOSE 97 103* 102* 90  BUN _0 CALCIUM 8.9 8.7* 9.2 8.2*  CREATININE 1.40* 1.14 1.09 0.97  GFRNONAA 52* >60 >60 >60  GFRAA >60 >60 >60 >60    LIVER FUNCTION TESTS: Recent Labs    05/05/19 0825 05/14/19 1501  BILITOT 0.8 0.8  AST 19 37  ALT 20 44  ALKPHOS 74 79  PROT 8.0 6.0*  ALBUMIN 4.7 3.4*     Assessment and Plan:  Left VA stenosis at the origin s/p revascularization using stent assisted angioplasty 07/20/2019 by Dr. Estanislado Pandy. Dr. Estanislado Pandy was present for consultation. Discussed recent procedure. Patient states that he is doing well since procedure. Explained that the best way to monitor  his stents is with routine imaging scans to monitor for changes, the first to be 3 months from recent procedure 07/20/2019.  Discussed return to activities/clearance. From our ([facility]) standpoint, patient does not have any further restrictions at this time (including driving/return to work). Instructed patient to follow-up with Dr. Letta Pate (rehabilitaion medicine) for full clearance, will inform him of above. Instructed patient to follow-up with PT/OT/speech therapy regularly.  Informed patient to call 911 if he notices any new neurologic symptoms.  Plan for follow-up with a carotid US bilaterally 3 months from procedure 07/20/2019. Informed patient that our schedulers will call him to set up this imaging scan. Instructed patient to continue taking Brilinta 90 mg twice daily and Aspirin 81 mg once daily. Instructed patient to follow-up with his PCP regularly.  All questions answered and concerns addressed. Patient conveys understanding and agrees with plan.  Thank you for this interesting consult.  I greatly enjoyed meeting [facility] and look forward to participating in their care.  A copy of this report was sent to the requesting provider on this date.  Electronically Signed: Earley Abide, PA-C 08/04/2019,  9:25 AM   I spent a total of 40 Minutes in face to face in clinical consultation, greater than 50% of which was counseling/coordinating care for left VA stenosis at the origin s/p revascularization.

## 2019-08-06 ENCOUNTER — Ambulatory Visit: Payer: Commercial Managed Care - PPO | Admitting: Physical Therapy

## 2019-08-06 ENCOUNTER — Ambulatory Visit: Payer: Commercial Managed Care - PPO | Admitting: Speech Pathology

## 2019-08-06 ENCOUNTER — Encounter: Payer: Self-pay | Admitting: Physical Therapy

## 2019-08-06 ENCOUNTER — Ambulatory Visit: Payer: Commercial Managed Care - PPO | Admitting: Occupational Therapy

## 2019-08-06 ENCOUNTER — Other Ambulatory Visit: Payer: Self-pay

## 2019-08-06 DIAGNOSIS — R4701 Aphasia: Secondary | ICD-10-CM

## 2019-08-06 DIAGNOSIS — R41841 Cognitive communication deficit: Secondary | ICD-10-CM

## 2019-08-06 DIAGNOSIS — R2681 Unsteadiness on feet: Secondary | ICD-10-CM

## 2019-08-06 DIAGNOSIS — R2689 Other abnormalities of gait and mobility: Secondary | ICD-10-CM | POA: Diagnosis not present

## 2019-08-06 DIAGNOSIS — R269 Unspecified abnormalities of gait and mobility: Secondary | ICD-10-CM

## 2019-08-06 NOTE — Therapy (Signed)
[facility] [address] [place], [name], [address] Phone: [phone]   Fax:  [phone]  Physical Therapy Treatment  Patient Details  Name: Jose Kaufman MRN: [id number] Date of Birth: 03-May-1951 Referring Provider (PT): Leonie Man   Encounter Date: 08/06/2019  CLINIC OPERATION CHANGES: [facility]patient [facility] is open at lower capacity following universal masking, social distancing, and patient screening.  The patient's COVID risk of complications score is 3.   PT End of Session - 08/06/19 2144    Visit Number  6    Number of Visits  9    Authorization Type  UME; 30 VL (PT/OT/aquatic counts as one visit on the same day HARD MAX); speech is separate 30 VL    Authorization - Visit Number  6   Should be 6th PT visit today-previous visit miscounted   Authorization - Number of Visits  15    PT Start Time  [id number]    PT Stop Time  1318    PT Time Calculation (min)  44 min    Equipment Utilized During Treatment  Gait belt    Activity Tolerance  Patient tolerated treatment well    Behavior During Therapy  WFL for tasks assessed/performed       Past Medical History:  Diagnosis Date  . Abnormality of gait 10/15/2016  . Arthritis   . Dysrhythmia    hx of  . Essential hypertension 03/14/2017  . PVCs (premature ventricular contractions) 03/14/2017  . Skin cancer    Skin  . Stroke [facility])    Mini strokes . 06/25/2019  Expressive aphasia - improving  . Wears glasses     Past Surgical History:  Procedure Laterality Date  . COLONOSCOPY    . IR ANGIO INTRA EXTRACRAN SEL COM CAROTID INNOMINATE BILAT MOD SED  05/07/2019  . IR ANGIO INTRA EXTRACRAN SEL COM CAROTID INNOMINATE BILAT MOD SED  07/20/2019  . IR ANGIO VERTEBRAL SEL SUBCLAVIAN INNOMINATE BILAT MOD SED  05/07/2019  . IR ANGIO VERTEBRAL SEL SUBCLAVIAN INNOMINATE UNI R MOD SED  07/20/2019  . IR PATIENT EVAL TECH 0-60 MINS  06/08/2019  . IR TRANSCATH EXCRAN VERT OR CAR A STENT   05/12/2019  . IR TRANSCATH EXCRAN VERT OR CAR A STENT  07/20/2019  . IR US GUIDE VASC ACCESS RIGHT  05/12/2019  . RADIOLOGY WITH ANESTHESIA N/A 05/08/2019   Procedure: RADIOLOGY WITH ANESTHESIA;  Surgeon: [name], MD;  Location: [place];  Service: Radiology;  Laterality: N/A;  . RADIOLOGY WITH ANESTHESIA N/A 05/12/2019   Procedure: carotid stenting;  Surgeon: [name], MD;  Location: [place];  Service: Radiology;  Laterality: N/A;  . RADIOLOGY WITH ANESTHESIA N/A 06/08/2019   Procedure: STENT PLACEMENT;  Surgeon: [name], MD;  Location: [place];  Service: Radiology;  Laterality: N/A;  . RADIOLOGY WITH ANESTHESIA N/A 07/20/2019   Procedure: RADIOLOGY WITH ANESTHESIA STENTING;  Surgeon: [name], MD;  Location: [place];  Service: Radiology;  Laterality: N/A;  . SKIN CANCER EXCISION    . TOTAL HIP ARTHROPLASTY Right 03/22/2017   Procedure: RIGHT TOTAL HIP ARTHROPLASTY ANTERIOR APPROACH;  Surgeon: Mcarthur Rossetti, MD;  Location: [facility];  Service: Orthopedics;  Laterality: Right;  . TOTAL HIP ARTHROPLASTY Left 08/23/2017   Procedure: LEFT TOTAL HIP ARTHROPLASTY ANTERIOR APPROACH;  Surgeon: Mcarthur Rossetti, MD;  Location: [facility];  Service: Orthopedics;  Laterality: Left;  . WISDOM TOOTH EXTRACTION      There were no vitals filed for this visit.  Subjective Assessment - 08/06/19 2134    Subjective  No changes, no falls since last visit.  At end of session, pt and brother report pt is not supposed to be making quick head turns following stent surgery.    Patient Stated Goals  Pt's goal for therapy is to walk better.    Currently in Pain?  No/denies                    Gait training: Gait with quick stop/starts, forward/back walking with supervision   [facility] Adult PT Treatment/Exercise - 08/06/19 0001      Ambulation/Gait   Ambulation/Gait  Yes    Ambulation/Gait Assistance  6: Modified independent (Device/Increase time)    Ambulation  Distance (Feet)  400 Feet   200 ft with environmental scanning tasks   Assistive device  None    Gait Pattern  Step-through pattern;Decreased arm swing - right;Decreased arm swing - left;Shuffle    Ambulation Surface  Level;Indoor    Gait Comments  Gait with environmental scanning-pt stops to look for requested objects.  With cues for just head turns and nods with gait, pt is able to perform with fast frequency of head motions (needs cues to slow pace of head turns).  Practiced short distance gait (20 ft) and turns to change directions, 6 reps, with cues for slowed, rocking turns.      Knee/Hip Exercises: Seated   Other Seated Knee/Hip Exercises       Sit to Sand  2 sets;without UE support;10 reps   from mat surface, 2nd set standing on [employer]         Balance Exercises - 08/06/19 2138      Balance Exercises: Standing   Other Standing Exercises  Standing on Aire[employer]:  alternating side step taps, cues to increase foot clearance.  Alternating step taps at 6" step, 3 sets of 10, with 2 UE support, 1 UE support, no UE support, then forward step taps >hip/knee extension (runner's stretch position, x 10 reps each leg.  Step up/up, down/down x 10 reps each leg leading with bilat UE support       Standing on Airex cushion at counter:  marching in place x 10, alternating hip kicks x 10, alternating forward step taps x 10, forward<>back step taps x 10 with UE support and cues for foot clearance.  PT Education - 08/06/19 2155    Education Details  Fall prevention education provided    [employer]) Educated  Patient;Other (comment)   brother   Methods  Explanation;Handout    Comprehension  Verbalized understanding       Self Care:  Provided fall prevention education-see instructions.  Discussed safety when pt is home alone, with pt needing cellphone or Life Alert type system in case of a fall.   PT Long Term Goals - 07/13/19 1136      PT LONG TERM GOAL #1   Title  Pt will be independent with  HEP for improved balance, strength, and gait.  TARGET 08/21/2019 (delayed due to pt having stent procedure 07/20/2019)    Time  4    Period  Weeks    Status  New    Target Date  08/21/19      PT LONG TERM GOAL #2   Title  Pt will improve 5x sit<>stand to less than or equal to 11.5 sec for improved functional strength.    Time  4    Period  Weeks    Status  New    Target Date  08/21/19      PT LONG TERM GOAL #3   Title  Pt will improve TUG score to less than or equal to 13.5 seconds for decreased fall risk.    Time  4    Period  Weeks    Status  New    Target Date  08/21/19      PT LONG TERM GOAL #4   Title  Pt will improve DGI score to at least 15/24 for decreased fall risk.    Time  4    Period  Weeks    Status  New    Target Date  08/21/19      PT LONG TERM GOAL #5   Title  Pt will verbalize understanding of fall prevention in home environment.    Time  4    Period  Weeks    Status  New    Target Date  08/21/19            Plan - 08/06/19 2148    Clinical Impression Statement  Pt continues to work on functional strengthening and balance during PT session this visit, as well as dynamic gait.  Pt continues to have difficulties with transition movements, but pt/brother report this was present prior to CVA.    Personal Factors and Comorbidities  Comorbidity 2    Comorbidities  Hx of OA, hx of bilateral hip replacements    Examination-Activity Limitations  Locomotion Level;Stairs    Examination-Participation Restrictions  Community Activity    Stability/Clinical Decision Making  Stable/Uncomplicated    Rehab Potential  Good    PT Frequency  2x / week    PT Duration  4 weeks   plus eval   PT Treatment/Interventions  ADLs/Self Care Home Management;Gait training;Stair training;Functional mobility training;Therapeutic activities;Therapeutic exercise;Balance training;Neuromuscular re-education;Patient/family education    PT Next Visit Plan  Begin to look at [place] and discuss  plans for d/c, as pt feels he is improving and near pre-CVA baseline; review HEP; avoid fast, excessive head turns (per pt/brother report following stent procedure)    PT Home Exercise Plan  Access Code: [email or web address]    Consulted and Agree with Plan of Care  Patient;Family member/caregiver    Family Member Consulted  brother       Patient will benefit from skilled therapeutic intervention in order to improve the following deficits and impairments:  Abnormal gait, Difficulty walking, Decreased balance, Decreased mobility, Decreased strength  Visit Diagnosis: Unsteadiness on feet  Abnormality of gait     Problem List Patient Active Problem List   Diagnosis Date Noted  . Vertebral artery stenosis 07/21/2019  . Vertebral artery narrowing, left 07/20/2019  . Non-fluent aphasia   . Acute bilateral cerebral infarction in a watershed distribution [facility]) 05/14/2019  . Vertebral artery narrowing, right 05/12/2019  . Hyperlipidemia 05/06/2019  . CVA (cerebral vascular accident) ([place]) 05/05/2019  . Internal carotid artery occlusion, bilateral   . Covid-19 Virus not Detected   . Status post total replacement of left hip 08/23/2017  . Unilateral primary osteoarthritis, left hip 05/02/2017  . Status post total replacement of right hip 03/22/2017  . PVCs (premature ventricular contractions) 03/14/2017  . Essential hypertension 03/14/2017  . Unilateral primary osteoarthritis, right hip 03/08/2017  . Bilateral hip pain 10/15/2016  . Abnormality of gait 10/15/2016    Korinna Tat W. 08/06/2019, 9:56 PM  Frazier Butt., PT   [facility] [address] [place], [name],  [id number] Phone: [phone]   Fax:  [phone]  Name: Corin Cuzzort MRN: [id number] Date of Birth: Feb 22, 1951

## 2019-08-06 NOTE — Therapy (Signed)
[facility] [address] [place], [name], [address] Phone: [phone]   Fax:  [phone]  Speech Language Pathology Treatment  Patient Details  Name: Jose Kaufman MRN: [id number] Date of Birth: [birth year] Referring Provider (SLP): Dr.Kirsteins   Encounter Date: 08/06/2019  End of Session - 08/06/19 1302    Visit Number  9    Number of Visits  17    Date for SLP Re-Evaluation  09/26/19    Authorization Type  UMR, 30 visits for OT/PT, 30 separate visits for ST    Authorization - Visit Number  8    Authorization - Number of Visits  30    SLP Start Time  1150    SLP Stop Time   1230    SLP Time Calculation (min)  40 min    Activity Tolerance  Patient tolerated treatment well       Past Medical History:  Diagnosis Date  . Abnormality of gait 10/15/2016  . Arthritis   . Dysrhythmia    hx of  . Essential hypertension 03/14/2017  . PVCs (premature ventricular contractions) 03/14/2017  . Skin cancer    Skin  . Stroke [facility])    Mini strokes . 06/25/2019  Expressive aphasia - improving  . Wears glasses     Past Surgical History:  Procedure Laterality Date  . COLONOSCOPY    . IR ANGIO INTRA EXTRACRAN SEL COM CAROTID INNOMINATE BILAT MOD SED  05/07/2019  . IR ANGIO INTRA EXTRACRAN SEL COM CAROTID INNOMINATE BILAT MOD SED  07/20/2019  . IR ANGIO VERTEBRAL SEL SUBCLAVIAN INNOMINATE BILAT MOD SED  05/07/2019  . IR ANGIO VERTEBRAL SEL SUBCLAVIAN INNOMINATE UNI R MOD SED  07/20/2019  . IR PATIENT EVAL TECH 0-60 MINS  06/08/2019  . IR TRANSCATH EXCRAN VERT OR CAR A STENT  05/12/2019  . IR TRANSCATH EXCRAN VERT OR CAR A STENT  07/20/2019  . IR US GUIDE VASC ACCESS RIGHT  05/12/2019  . RADIOLOGY WITH ANESTHESIA N/A 05/08/2019   Procedure: RADIOLOGY WITH ANESTHESIA;  Surgeon: [name], MD;  Location: [place];  Service: Radiology;  Laterality: N/A;  . RADIOLOGY WITH ANESTHESIA N/A 05/12/2019   Procedure: carotid stenting;   Surgeon: [name], MD;  Location: [place];  Service: Radiology;  Laterality: N/A;  . RADIOLOGY WITH ANESTHESIA N/A 06/08/2019   Procedure: STENT PLACEMENT;  Surgeon: [name], MD;  Location: [place];  Service: Radiology;  Laterality: N/A;  . RADIOLOGY WITH ANESTHESIA N/A 07/20/2019   Procedure: RADIOLOGY WITH ANESTHESIA STENTING;  Surgeon: [name], MD;  Location: [place];  Service: Radiology;  Laterality: N/A;  . SKIN CANCER EXCISION    . TOTAL HIP ARTHROPLASTY Right 03/22/2017   Procedure: RIGHT TOTAL HIP ARTHROPLASTY ANTERIOR APPROACH;  Surgeon: Mcarthur Rossetti, MD;  [facility];  Service: Orthopedics;  Laterality: Right;  . TOTAL HIP ARTHROPLASTY Left 08/23/2017   Procedure: LEFT TOTAL HIP ARTHROPLASTY ANTERIOR APPROACH;  Surgeon: Mcarthur Rossetti, MD;  Location: [facility];  Service: Orthopedics;  Laterality: Left;  . WISDOM TOOTH EXTRACTION      There were no vitals filed for this visit.  Subjective Assessment - 08/06/19 1152    Subjective  Pt missed a button on his shirt, unaware.            ADULT SLP TREATMENT - 08/06/19 1153      General Information   Behavior/Cognition  Alert;Cooperative      Treatment Provided   Treatment provided  Cognitive-Linquistic  Pain Assessment   Pain Assessment  No/denies pain      Cognitive-Linquistic Treatment   Treatment focused on  Cognition;Aphasia    Skilled Treatment  Pt had several errors on his homework (alternating attention), which he had self-corrected. SLP educated pt how difficulties with alternating attention may impact him in the workplace, such as entering digits/parts numbers correctly into his computer. Pt stated tasks at work are routine and that he feels he would not have difficulty. SLP reviewed compensations for anomia/increased fluency. Pt named 2: slower rate, and finding a "replacement word." SLP engaged pt in mod complex conversation re: specifics of his workplace. Pt had  several hesitations/dysfluencies, and required verbal cue x3 to slow rate over 20 minutes conversation. Aside from these 3 breakdowns, pt's mod complex conversation was largely functional with extended time. In written directions task, pt required extended time for processing written instructions. Some errors in transposing words; pt corrected 2/2.       Assessment / Recommendations / Plan   Plan  Continue with current plan of care      Progression Toward Goals   Progression toward goals  Progressing toward goals       SLP Education - 08/06/19 1302    Education Details  deficit areas, how these may impact pt in the workplace    Person(s) Educated  Patient;Other (comment)   brother   Methods  Explanation    Comprehension  Verbalized understanding;Need further instruction       SLP Short Term Goals - 08/06/19 1304      SLP SHORT TERM GOAL #1   Title  Pt will maintain selective attention for 15 minutes to a functional task (such as meal planning, billpaying) a moderately distracting environment x3 visits.    Baseline  06/25/19, 07/07/19, 07/09/19    Time  2    Period  Weeks    Status  Achieved      SLP SHORT TERM GOAL #2   Title  Pt will complete further assessment of language, including reading/writing, with goals added as needed.    Baseline  06/22/19    Time  2    Period  Weeks    Status  Achieved      SLP SHORT TERM GOAL #3   Title  Pt will tell SLP 3 cognitive deficits.    Time  1    Period  Weeks    Status  Not Met      SLP SHORT TERM GOAL #4   Title  Pt will utilize compensations for anomia in simple conversation with rare min A over 2 sessions    Baseline  07/28/19 07/30/19    Time  2    Period  Weeks    Status  Achieved   simple conversation [facility]     SLP SHORT TERM GOAL #5   Title  Pt will write simple sentences with extended time and occasional min A.    Baseline  07/28/19 07/30/19    Time  2    Period  Weeks    Status  Achieved       SLP Long Term Goals -  08/06/19 1304      SLP LONG TERM GOAL #1   Title  Pt will use external aids/notebook to manage finances, schedule, meal-planning x3 sessions.    Baseline  07/28/19, 08/04/19    Time  5    Period  Weeks    Status  On-going      SLP  LONG TERM GOAL #2   Title  Pt will correct 75% of errors in functional therapy tasks with rare min A for double checking.    Time  5    Period  Weeks    Status  On-going      SLP LONG TERM GOAL #3   Title  Pt will use compensations functionally (slowed rate, preplanning) in phone call scenarios x3 sessions.    Status  Deferred      SLP LONG TERM GOAL #4   Title  Pt will utilize compensations for aphasia over 15 minute moderately complex conversation with rare min A over 2 sessions    Baseline  08/06/19    Time  5    Period  Weeks    Status  On-going       Plan - 08/06/19 1303    Clinical Impression Statement  Pt presents with improving cognitive communication and expressive language deficits, which appear more in range of mild today. Mild stuttering, hesistations in 20 min mod complex conversation today. Slow processing persists in all tasks, as well as decreased awareness of deficits and functional impacts. See skilled intervention for details. I recommend skilled ST to address cognitive and language impairments in order to improve safety, independence and for possible return to work.    Speech Therapy Frequency  2x / week    Duration  --   8 weeks or 17 visits   Treatment/Interventions  Compensatory strategies;Patient/family education;Functional tasks;Cueing hierarchy;Environmental controls;Cognitive reorganization;Multimodal communcation approach;Internal/external aids;Compensatory techniques;Language facilitation;SLP instruction and feedback    Potential to Achieve Goals  Good    Potential Considerations  Ability to learn/carryover information    Consulted and Agree with Plan of Care  Patient;Family member/caregiver    Family Member Consulted  brother        Patient will benefit from skilled therapeutic intervention in order to improve the following deficits and impairments:   Aphasia  Cognitive communication deficit    Problem List Patient Active Problem List   Diagnosis Date Noted  . Vertebral artery stenosis 07/21/2019  . Vertebral artery narrowing, left 07/20/2019  . Non-fluent aphasia   . Acute bilateral cerebral infarction in a watershed distribution [facility]) 05/14/2019  . Vertebral artery narrowing, right 05/12/2019  . Hyperlipidemia 05/06/2019  . CVA (cerebral vascular accident) ([place]) 05/05/2019  . Internal carotid artery occlusion, bilateral   . Covid-19 Virus not Detected   . Status post total replacement of left hip 08/23/2017  . Unilateral primary osteoarthritis, left hip 05/02/2017  . Status post total replacement of right hip 03/22/2017  . PVCs (premature ventricular contractions) 03/14/2017  . Essential hypertension 03/14/2017  . Unilateral primary osteoarthritis, right hip 03/08/2017  . Bilateral hip pain 10/15/2016  . Abnormality of gait 10/15/2016   Deneise Lever, [place], [place] 08/06/2019, 1:05 PM  [facility] [address] [place], [name], [address] Phone: [phone]   Fax:  [phone]   Name: Jose Kaufman MRN: [id number] Date of Birth: 1951-11-08

## 2019-08-07 ENCOUNTER — Encounter: Payer: Commercial Managed Care - PPO | Attending: Registered Nurse | Admitting: Physical Medicine & Rehabilitation

## 2019-08-07 ENCOUNTER — Encounter: Payer: Self-pay | Admitting: Physical Medicine & Rehabilitation

## 2019-08-07 ENCOUNTER — Other Ambulatory Visit: Payer: Self-pay

## 2019-08-07 VITALS — BP 156/87 | HR 82 | Temp 98.5°F | Ht 68.0 in | Wt 159.0 lb

## 2019-08-07 DIAGNOSIS — I69398 Other sequelae of cerebral infarction: Secondary | ICD-10-CM | POA: Diagnosis not present

## 2019-08-07 DIAGNOSIS — I6389 Other cerebral infarction: Secondary | ICD-10-CM | POA: Diagnosis present

## 2019-08-07 DIAGNOSIS — I69390 Apraxia following cerebral infarction: Secondary | ICD-10-CM | POA: Diagnosis not present

## 2019-08-07 DIAGNOSIS — I10 Essential (primary) hypertension: Secondary | ICD-10-CM | POA: Insufficient documentation

## 2019-08-07 DIAGNOSIS — E7849 Other hyperlipidemia: Secondary | ICD-10-CM | POA: Diagnosis present

## 2019-08-07 DIAGNOSIS — R269 Unspecified abnormalities of gait and mobility: Secondary | ICD-10-CM | POA: Diagnosis not present

## 2019-08-07 NOTE — Progress Notes (Signed)
Subjective:    Patient ID: Jose Kaufman, male    DOB: Nov 26, 1951, 68 y.o.   MRN: [id number]  HPI  68 year old male with left MCA distribution infarct with aphasia, ideomotor apraxia. No falls thus far  Dressing and bathing Mod I I reviewed his PT OT and speech therapy note.  There is still some visual spatial deficits.  OT unfortunately has been off for the last week and according to patient his OT session next week is also canceled. Pain Inventory Average Pain 0 Pain Right Now 0 My pain is n/a  In the last 24 hours, has pain interfered with the following? General activity 0 Relation with others 0 Enjoyment of life 0 What TIME of day is your pain at its worst? n/a Sleep (in general) Good  Pain is worse with: n/a Pain improves with: n/a Relief from Meds: n/a  Mobility how many minutes can you walk? 10 ability to climb steps?  yes do you drive?  yes  Function employed # of hrs/week 69 disabled: date disabled 05/29  Neuro/Psych No problems in this area  Prior Studies Any changes since last visit?  no  Physicians involved in your care Any changes since last visit?  no   Family History  Problem Relation Age of Onset  . Leukemia Mother   . Pneumonia Father    Social History   Socioeconomic History  . Marital status: Divorced    Spouse name: Not on file  . Number of children: 2  . Years of education: 17  . Highest education level: Not on file  Occupational History  . Occupation: [employer]  . Financial resource strain: Not on file  . Food insecurity    Worry: Not on file    Inability: Not on file  . Transportation needs    Medical: Not on file    Non-medical: Not on file  Tobacco Use  . Smoking status: Former Smoker    Types: Cigarettes    Quit date: 05/20/1992    Years since quitting: 27.2  . Smokeless tobacco: Never Used  Substance and Sexual Activity  . Alcohol use: Not Currently  . Drug use: No  . Sexual activity: Yes   Lifestyle  . Physical activity    Days per week: Not on file    Minutes per session: Not on file  . Stress: Not on file  Relationships  . Social Herbalist on phone: Not on file    Gets together: Not on file    Attends religious service: Not on file    Active member of club or organization: Not on file    Attends meetings of clubs or organizations: Not on file    Relationship status: Not on file  Other Topics Concern  . Not on file  Social History Narrative   Pt lives at home, divorced   Right-handed   Caffeine: soda daily   Past Surgical History:  Procedure Laterality Date  . COLONOSCOPY    . IR ANGIO INTRA EXTRACRAN SEL COM CAROTID INNOMINATE BILAT MOD SED  05/07/2019  . IR ANGIO INTRA EXTRACRAN SEL COM CAROTID INNOMINATE BILAT MOD SED  07/20/2019  . IR ANGIO VERTEBRAL SEL SUBCLAVIAN INNOMINATE BILAT MOD SED  05/07/2019  . IR ANGIO VERTEBRAL SEL SUBCLAVIAN INNOMINATE UNI R MOD SED  07/20/2019  . IR PATIENT EVAL TECH 0-60 MINS  06/08/2019  . IR TRANSCATH EXCRAN VERT OR CAR A STENT  05/12/2019  . IR TRANSCATH  EXCRAN VERT OR CAR A STENT  07/20/2019  . IR US GUIDE VASC ACCESS RIGHT  05/12/2019  . RADIOLOGY WITH ANESTHESIA N/A 05/08/2019   Procedure: RADIOLOGY WITH ANESTHESIA;  Surgeon: [name], MD;  Location: [place];  Service: Radiology;  Laterality: N/A;  . RADIOLOGY WITH ANESTHESIA N/A 05/12/2019   Procedure: carotid stenting;  Surgeon: [name], MD;  Location: [place];  Service: Radiology;  Laterality: N/A;  . RADIOLOGY WITH ANESTHESIA N/A 06/08/2019   Procedure: STENT PLACEMENT;  Surgeon: [name], MD;  Location: [place];  Service: Radiology;  Laterality: N/A;  . RADIOLOGY WITH ANESTHESIA N/A 07/20/2019   Procedure: RADIOLOGY WITH ANESTHESIA STENTING;  Surgeon: [name], MD;  Location: [place];  Service: Radiology;  Laterality: N/A;  . SKIN CANCER EXCISION    . TOTAL HIP ARTHROPLASTY Right 03/22/2017   Procedure: RIGHT TOTAL HIP ARTHROPLASTY  ANTERIOR APPROACH;  Surgeon: Mcarthur Rossetti, MD;  Location: [facility];  Service: Orthopedics;  Laterality: Right;  . TOTAL HIP ARTHROPLASTY Left 08/23/2017   Procedure: LEFT TOTAL HIP ARTHROPLASTY ANTERIOR APPROACH;  Surgeon: Mcarthur Rossetti, MD;  Location: [facility];  Service: Orthopedics;  Laterality: Left;  . WISDOM TOOTH EXTRACTION     Past Medical History:  Diagnosis Date  . Abnormality of gait 10/15/2016  . Arthritis   . Dysrhythmia    hx of  . Essential hypertension 03/14/2017  . PVCs (premature ventricular contractions) 03/14/2017  . Skin cancer    Skin  . Stroke [facility])    Mini strokes . 06/25/2019  Expressive aphasia - improving  . Wears glasses    There were no vitals taken for this visit.  Opioid Risk Score:   Fall Risk Score:  `1  Depression screen PHQ 2/9  Depression screen PHQ 2/9 06/03/2019  Decreased Interest 0  Down, Depressed, Hopeless 0  PHQ - 2 Score 0  Altered sleeping 0  Tired, decreased energy 0  Change in appetite 0  Feeling bad or failure about yourself  0  Trouble concentrating 0  Moving slowly or fidgety/restless 1  Suicidal thoughts 0  PHQ-9 Score 1  Difficult doing work/chores Not difficult at all    Review of Systems  Constitutional: Negative.   HENT: Negative.   Eyes: Negative.   Respiratory: Negative.   Cardiovascular: Negative.   Gastrointestinal: Negative.   Endocrine: Negative.   Genitourinary: Negative.   Musculoskeletal: Negative.   Skin: Negative.   Allergic/Immunologic: Negative.   Neurological: Negative.   Hematological: Negative.   Psychiatric/Behavioral: Negative.   All other systems reviewed and are negative.      Objective:   Physical Exam Constitutional:      Appearance: Normal appearance.  Eyes:     General: No visual field deficit.    Extraocular Movements: Extraocular movements intact.     Conjunctiva/sclera: Conjunctivae normal.     Pupils: Pupils are equal, round, and reactive to light.   Musculoskeletal:     Comments: Patient with good range of motion upper and lower extremities  Skin:    General: Skin is warm and dry.  Neurological:     General: No focal deficit present.     Mental Status: He is alert and oriented to person, place, and time.     Cranial Nerves: No dysarthria.     Motor: No weakness.     Coordination: Coordination normal. Finger-Nose-Finger Test normal.     Gait: Tandem walk abnormal.     Comments: Evidence of ideomotor apraxia he has difficulty  imitating movements such as tandem gait as well as heel He is able to follow manual muscle testing to verbal commands. His motor strength is 5/5 bilateral deltoid bicep tricep grip hip flexion knee extension ankle dorsiflexion Speech occasional disfluency but overall at a sentence level   Psychiatric:        Mood and Affect: Mood normal.        Behavior: Behavior normal.           Assessment & Plan:  1.  Left MCA distribution infarct he has some residual ideomotor apraxia with OT testing he has showed some perceptual deficits.  OT in fact sent me a message indicating concerned about patient's desire to return to work and driving.  At this point I would like OT to reevaluate him.  We will see if he can get in with OT next week rather than to wait another week.  I like to see the patient back in 3 weeks.  His goal was to get the work by Labor Day but this will likely not occur

## 2019-08-07 NOTE — Patient Instructions (Signed)
No driving or work until re eval

## 2019-08-11 ENCOUNTER — Ambulatory Visit: Payer: Commercial Managed Care - PPO | Admitting: Occupational Therapy

## 2019-08-11 ENCOUNTER — Encounter: Payer: Self-pay | Admitting: Physical Therapy

## 2019-08-11 ENCOUNTER — Ambulatory Visit: Payer: Commercial Managed Care - PPO | Admitting: Speech Pathology

## 2019-08-11 ENCOUNTER — Ambulatory Visit: Payer: Commercial Managed Care - PPO | Attending: Physical Medicine & Rehabilitation | Admitting: Physical Therapy

## 2019-08-11 ENCOUNTER — Other Ambulatory Visit: Payer: Self-pay

## 2019-08-11 DIAGNOSIS — R41841 Cognitive communication deficit: Secondary | ICD-10-CM

## 2019-08-11 DIAGNOSIS — M6281 Muscle weakness (generalized): Secondary | ICD-10-CM | POA: Diagnosis present

## 2019-08-11 DIAGNOSIS — I69318 Other symptoms and signs involving cognitive functions following cerebral infarction: Secondary | ICD-10-CM | POA: Diagnosis present

## 2019-08-11 DIAGNOSIS — R2689 Other abnormalities of gait and mobility: Secondary | ICD-10-CM | POA: Diagnosis present

## 2019-08-11 DIAGNOSIS — R41840 Attention and concentration deficit: Secondary | ICD-10-CM | POA: Diagnosis present

## 2019-08-11 DIAGNOSIS — R2681 Unsteadiness on feet: Secondary | ICD-10-CM

## 2019-08-11 DIAGNOSIS — R4701 Aphasia: Secondary | ICD-10-CM | POA: Insufficient documentation

## 2019-08-11 DIAGNOSIS — R41842 Visuospatial deficit: Secondary | ICD-10-CM | POA: Diagnosis present

## 2019-08-11 DIAGNOSIS — R278 Other lack of coordination: Secondary | ICD-10-CM | POA: Diagnosis present

## 2019-08-11 DIAGNOSIS — R269 Unspecified abnormalities of gait and mobility: Secondary | ICD-10-CM | POA: Diagnosis present

## 2019-08-11 NOTE — Patient Instructions (Signed)
Local Driver Evaluation Programs: ° °Comprehensive Evaluation: includes clinical and in vehicle behind the wheel testing by OCCUPATIONAL THERAPIST. Programs have varying levels of adaptive controls available for trial.  ° °[facility] °[address] °[place], [place]  [address] °[phone] or [phone] °http://www.driver-rehab.com °Evaluator:  Cyndee Crompton, OT/CDRS/CDI/SCDCM/Low Vision Certification ° °[facility] °[address] °[place], [place] [address] °[phone] °https://www.[facility].org/home/services/rehabilitation.aspx °Evaluators:  Shannon Sheek, OT and Jill Tucker, OT ° °[facility] [place] [facility] (ONLY SERVES VETERANS!!) °[facility] °[address] °[place], [place]  [address] °[phone] °http://www.[place].va.gov/services/Physical_Medicine_Rehabilitation_Services.asp °Evaluators:  Eric Andrews, KT; Heidi Harris, KT;  Gary Whitaker, KT (KT=kiniesotherapist) ° ° °Clinical evaluations only:  Includes clinical testing, refers to other programs or local certified driving instructor for behind the wheel testing. ° °[facility] (outpatient Rehab) °[facility] °[address] °[place], [place] [address] °[phone] for scheduling °http://www.[facility].edu/Outpatient-Rehabilitation/Neurorehabilitation-Therapy.htm °Evaluators:  Kelly Lambeth, OT; Kate Phillips, OT ° °Other area clinical evaluators available upon request including [facility], [facility] and [facility]. ° ° °    Resource List °What is a Driver Evaluation: °Your Road Ahead - A Guide to Comprehensive Driving Evaluations °http://www.thehartford.com/resources/mature-market-excellence/publications-on-aging ° °Association for [employer] Rehabilitation Services - Disability and Driving Fact Sheets °http://www.aded.net/?page=510 ° °Driving after a Brain  Injury: °[employer] °http://www.biausa.org/tbims-abstracts/if-there-is-an-effective-way-to-determine-if-someone-is-ready-to-drive-after-tbi?A=SearchResult&SearchID=9495675&ObjectID=2758842&ObjectType=35 ° °Driving with Adaptive Equipment: °[facility] Process °http://www.driver-rehab.com/adaptive-equipment ° °[employer] Mob[employer] °http://www.nmeda.com/ ° ° ° ° ° ° °  °

## 2019-08-11 NOTE — Therapy (Signed)
[facility] [address] [place], [name], [address] Phone: [phone]   Fax:  [phone]  Physical Therapy Treatment  Patient Details  Name: Jose Kaufman MRN: [id number] Date of Birth: Apr 23, 1951 Referring Provider (PT): Leonie Man   Encounter Date: 08/11/2019  CLINIC OPERATION CHANGES: Outpatient Neuro Rehab is open at lower capacity following universal masking, social distancing, and patient screening.  The patient's COVID risk of complications score is 3.  PT End of Session - 08/11/19 1437    Visit Number  7    Number of Visits  9    Authorization Type  UME; 30 VL (PT/OT/aquatic counts as one visit on the same day HARD MAX); speech is separate 30 VL    Authorization - Visit Number  7   Should be 6th PT visit today-previous visit miscounted   Authorization - Number of Visits  15    PT Start Time  0805    PT Stop Time  0847    PT Time Calculation (min)  42 min    Equipment Utilized During Treatment  --    Activity Tolerance  Patient tolerated treatment well    Behavior During Therapy  WFL for tasks assessed/performed       Past Medical History:  Diagnosis Date  . Abnormality of gait 10/15/2016  . Arthritis   . Dysrhythmia    hx of  . Essential hypertension 03/14/2017  . PVCs (premature ventricular contractions) 03/14/2017  . Skin cancer    Skin  . Stroke [facility])    Mini strokes . 06/25/2019  Expressive aphasia - improving  . Wears glasses     Past Surgical History:  Procedure Laterality Date  . COLONOSCOPY    . IR ANGIO INTRA EXTRACRAN SEL COM CAROTID INNOMINATE BILAT MOD SED  05/07/2019  . IR ANGIO INTRA EXTRACRAN SEL COM CAROTID INNOMINATE BILAT MOD SED  07/20/2019  . IR ANGIO VERTEBRAL SEL SUBCLAVIAN INNOMINATE BILAT MOD SED  05/07/2019  . IR ANGIO VERTEBRAL SEL SUBCLAVIAN INNOMINATE UNI R MOD SED  07/20/2019  . IR PATIENT EVAL TECH 0-60 MINS  06/08/2019  . IR TRANSCATH EXCRAN VERT OR CAR A STENT  05/12/2019  . IR  TRANSCATH EXCRAN VERT OR CAR A STENT  07/20/2019  . IR US GUIDE VASC ACCESS RIGHT  05/12/2019  . RADIOLOGY WITH ANESTHESIA N/A 05/08/2019   Procedure: RADIOLOGY WITH ANESTHESIA;  Surgeon: [name], MD;  Location: [place];  Service: Radiology;  Laterality: N/A;  . RADIOLOGY WITH ANESTHESIA N/A 05/12/2019   Procedure: carotid stenting;  Surgeon: [name], MD;  Location: [place];  Service: Radiology;  Laterality: N/A;  . RADIOLOGY WITH ANESTHESIA N/A 06/08/2019   Procedure: STENT PLACEMENT;  Surgeon: [name], MD;  Location: [place];  Service: Radiology;  Laterality: N/A;  . RADIOLOGY WITH ANESTHESIA N/A 07/20/2019   Procedure: RADIOLOGY WITH ANESTHESIA STENTING;  Surgeon: [name], MD;  Location: [place];  Service: Radiology;  Laterality: N/A;  . SKIN CANCER EXCISION    . TOTAL HIP ARTHROPLASTY Right 03/22/2017   Procedure: RIGHT TOTAL HIP ARTHROPLASTY ANTERIOR APPROACH;  Surgeon: Mcarthur Rossetti, MD;  [facility];  Service: Orthopedics;  Laterality: Right;  . TOTAL HIP ARTHROPLASTY Left 08/23/2017   Procedure: LEFT TOTAL HIP ARTHROPLASTY ANTERIOR APPROACH;  Surgeon: Mcarthur Rossetti, MD;  Location: [facility];  Service: Orthopedics;  Laterality: Left;  . WISDOM TOOTH EXTRACTION      There were no vitals filed for this visit.  Subjective Assessment -  08/11/19 0808    Subjective  No changes, no falls.  Went to see Dr. Letta Pate since I saw you last, and he doesn't want me driving or returning to work.  I can't say I understand.    Pertinent History  Hx of bilateral hip replacement 2018, hx of OA    Patient Stated Goals  Pt's goal for therapy is to walk better.    Currently in Pain?  No/denies                       [facility] Adult PT Treatment/Exercise - 08/11/19 0001      Transfers   Transfers  Sit to Stand;Stand to Sit    Sit to Stand  5: Supervision;With upper extremity assist;From chair/3-in-1;4: Min guard   Min assist to prevent chair  from moving too far posteriorly   Five time sit to stand comments   13.63 no UEs with posterior push of legs on chair; 11.09 sec with use of UEs    Stand to Sit  5: Supervision;Without upper extremity assist;To chair/3-in-1    Comments  Multiple cues and trials needed to complete sit to stand technique for 5TSTS correctly      Ambulation/Gait   Ambulation/Gait  Yes    Ambulation/Gait Assistance  6: Modified independent (Device/Increase time)    Ambulation Distance (Feet)  400 Feet    Assistive device  None    Gait Pattern  Step-through pattern;Decreased arm swing - right;Decreased arm swing - left;Shuffle    Ambulation Surface  Level;Indoor    Gait velocity  10.97 sec = 2.99 ft/sec      Standardized Balance Assessment   Standardized Balance Assessment  Timed Up and Go Test;Dynamic Gait Index      Dynamic Gait Index   Level Surface  Mild Impairment    Change in Gait Speed  Moderate Impairment    Gait with Horizontal Head Turns  Moderate Impairment    Gait with Vertical Head Turns  Mild Impairment    Gait and Pivot Turn  Normal    Step Over Obstacle  Moderate Impairment    Step Around Obstacles  Mild Impairment    Steps  Mild Impairment    Total Score  14      Timed Up and Go Test   TUG  Normal TUG    Normal TUG (seconds)  16.56   14.6 sec, then 14.03     High Level Balance   High Level Balance Activities  Backward walking;Tandem walking   Forward/back walking     Self-Care   Self-Care  Other Self-Care Comments    Other Self-Care Comments   Discussed progress towards goals, but pt at continued fall risk.  Discussed how pt's apraxia affects initiation and how pt improves with repetition and practice of movement.       Discussed continuing therapy towards LTGs and to complete current POC, based on progress to goals, but not at goal level.            PT Long Term Goals - 08/11/19 1437      PT LONG TERM GOAL #1   Title  Pt will be independent with HEP for improved  balance, strength, and gait.  TARGET 08/21/2019 (delayed due to pt having stent procedure 07/20/2019)    Time  4    Period  Weeks    Status  On-going      PT LONG TERM GOAL #2   Title  Pt  will improve 5x sit<>stand to less than or equal to 11.5 sec for improved functional strength.    Time  4    Period  Weeks    Status  On-going      PT LONG TERM GOAL #3   Title  Pt will improve TUG score to less than or equal to 13.5 seconds for decreased fall risk.    Time  4    Period  Weeks    Status  On-going      PT LONG TERM GOAL #4   Title  Pt will improve DGI score to at least 15/24 for decreased fall risk.    Time  4    Period  Weeks    Status  On-going      PT LONG TERM GOAL #5   Title  Pt will verbalize understanding of fall prevention in home environment.    Time  4    Period  Weeks    Status  Achieved            Plan - 08/11/19 1438    Clinical Impression Statement  Began looking at LTGs this visit, with pt making progress towards 5x sit<>stand, gait velcoity, TUG and gait velocity scores, but has not yet fully met goals as stated.  Pt is improving, but remains at fall risk; he does exhibit hesitation with initiation of gait and festination with turns, but feels this was present prior to his CVA.  He will continue to benefit from skilled PT to address balance and gait for improved functional mobility.    Personal Factors and Comorbidities  Comorbidity 2    Comorbidities  Hx of OA, hx of bilateral hip replacements    Examination-Activity Limitations  Locomotion Level;Stairs    Examination-Participation Restrictions  Community Activity    Stability/Clinical Decision Making  Stable/Uncomplicated    Rehab Potential  Good    PT Frequency  2x / week    PT Duration  4 weeks   plus eval   PT Treatment/Interventions  ADLs/Self Care Home Management;Gait training;Stair training;Functional mobility training;Therapeutic activities;Therapeutic exercise;Balance training;Neuromuscular  re-education;Patient/family education    PT Next Visit Plan  Continue towards LTGs; fully assess HEP and progress as needed; dynamic balance and functional strength/transfers; plan to see pt for additional scheduled visits to work towards [place]  Access Code: [email or web address]    Consulted and Agree with Plan of Care  Patient;Family member/caregiver    Family Member Consulted  brother       Patient will benefit from skilled therapeutic intervention in order to improve the following deficits and impairments:  Abnormal gait, Difficulty walking, Decreased balance, Decreased mobility, Decreased strength  Visit Diagnosis: Unsteadiness on feet  Abnormality of gait  Muscle weakness (generalized)     Problem List Patient Active Problem List   Diagnosis Date Noted  . Vertebral artery stenosis 07/21/2019  . Vertebral artery narrowing, left 07/20/2019  . Non-fluent aphasia   . Acute bilateral cerebral infarction in a watershed distribution [facility]) 05/14/2019  . Vertebral artery narrowing, right 05/12/2019  . Hyperlipidemia 05/06/2019  . CVA (cerebral vascular accident) ([place]) 05/05/2019  . Internal carotid artery occlusion, bilateral   . Covid-19 Virus not Detected   . Status post total replacement of left hip 08/23/2017  . Unilateral primary osteoarthritis, left hip 05/02/2017  . Status post total replacement of right hip 03/22/2017  . PVCs (premature ventricular contractions) 03/14/2017  . Essential hypertension 03/14/2017  . Unilateral primary  osteoarthritis, right hip 03/08/2017  . Bilateral hip pain 10/15/2016  . Abnormality of gait 10/15/2016    Mirca Yale W. 08/11/2019, 3:06 PM  Frazier Butt., PT   [facility] [address] [place], [name], [address] Phone: [phone]   Fax:  [phone]  Name: Jose Kaufman MRN: [id number] Date of Birth: 23-Nov-1951

## 2019-08-11 NOTE — Therapy (Signed)
[facility] [address] [place], [name], [address] Phone: [phone]   Fax:  [phone]  Speech Language Pathology Treatment and Progress Note  Patient Details  Name: Jose Kaufman MRN: [id number] Date of Birth: 07/13/1951 Referring Provider (SLP): Dr.Kirsteins   Encounter Date: 08/11/2019  End of Session - 08/11/19 1042    Visit Number  10    Number of Visits  17    Date for SLP Re-Evaluation  09/26/19    Authorization Type  [facility], 30 visits for OT/PT, 30 separate visits for ST    Authorization - Visit Number  9    Authorization - Number of Visits  57    SLP Start Time  0920    SLP Stop Time   1015    SLP Time Calculation (min)  55 min    Activity Tolerance  Patient tolerated treatment well       Past Medical History:  Diagnosis Date  . Abnormality of gait 10/15/2016  . Arthritis   . Dysrhythmia    hx of  . Essential hypertension 03/14/2017  . PVCs (premature ventricular contractions) 03/14/2017  . Skin cancer    Skin  . Stroke [facility])    Mini strokes . 06/25/2019  Expressive aphasia - improving  . Wears glasses     Past Surgical History:  Procedure Laterality Date  . COLONOSCOPY    . IR ANGIO INTRA EXTRACRAN SEL COM CAROTID INNOMINATE BILAT MOD SED  05/07/2019  . IR ANGIO INTRA EXTRACRAN SEL COM CAROTID INNOMINATE BILAT MOD SED  07/20/2019  . IR ANGIO VERTEBRAL SEL SUBCLAVIAN INNOMINATE BILAT MOD SED  05/07/2019  . IR ANGIO VERTEBRAL SEL SUBCLAVIAN INNOMINATE UNI R MOD SED  07/20/2019  . IR PATIENT EVAL TECH 0-60 MINS  06/08/2019  . IR TRANSCATH EXCRAN VERT OR CAR A STENT  05/12/2019  . IR TRANSCATH EXCRAN VERT OR CAR A STENT  07/20/2019  . IR US GUIDE VASC ACCESS RIGHT  05/12/2019  . RADIOLOGY WITH ANESTHESIA N/A 05/08/2019   Procedure: RADIOLOGY WITH ANESTHESIA;  Surgeon: [name], MD;  Location: [facility] [facility];  Service: Radiology;  Laterality: N/A;  . RADIOLOGY WITH ANESTHESIA N/A 05/12/2019   Procedure: carotid  stenting;  Surgeon: [name], MD;  Location: [place];  Service: Radiology;  Laterality: N/A;  . RADIOLOGY WITH ANESTHESIA N/A 06/08/2019   Procedure: STENT PLACEMENT;  Surgeon: [name], MD;  Location: [place];  Service: Radiology;  Laterality: N/A;  . RADIOLOGY WITH ANESTHESIA N/A 07/20/2019   Procedure: RADIOLOGY WITH ANESTHESIA STENTING;  Surgeon: [name], MD;  Location: [facility];  Service: Radiology;  Laterality: N/A;  . SKIN CANCER EXCISION    . TOTAL HIP ARTHROPLASTY Right 03/22/2017   Procedure: RIGHT TOTAL HIP ARTHROPLASTY ANTERIOR APPROACH;  Surgeon: Mcarthur Rossetti, MD;  Location: [facility];  Service: Orthopedics;  Laterality: Right;  . TOTAL HIP ARTHROPLASTY Left 08/23/2017   Procedure: LEFT TOTAL HIP ARTHROPLASTY ANTERIOR APPROACH;  Surgeon: Mcarthur Rossetti, MD;  Location: [facility];  Service: Orthopedics;  Laterality: Left;  . WISDOM TOOTH EXTRACTION      There were no vitals filed for this visit.  Subjective Assessment - 08/11/19 1026    Subjective  "He said I needed more OT."    Patient is accompained by:  Family member    Currently in Pain?  No/denies            ADULT SLP TREATMENT - 08/11/19 1028      General  Information   Behavior/Cognition  Alert;Cooperative      Treatment Provided   Treatment provided  Cognitive-Linquistic      Pain Assessment   Pain Assessment  No/denies pain      Cognitive-Linquistic Treatment   Treatment focused on  Cognition;Aphasia    Skilled Treatment  Per note from Dr. Letta Pate, MD would like reassessment prior to giving recommendations re: return to work/driving; pt has follow-up scheduled in 3 weeks. Pt reports frustration about this and feels he would be able to drive and work without difficulty. SLP educated pt re: deficit areas we have been working on in therapy, with higher level attention, slow processing, and impaired executive function being areas that could impact his safety on the road and  function at work. Readministered Cognitive-Linguistic Quick Test; pt demonstrates improvement in overall scores compared with initial assessment 05/25/19. Attention score improved to 184/215 from 147, now on lower end of WNL. Memory stable 167/185, WNL, compared with 164, Executive functions improved to 22/40 from 16; overall mild, Language stable WNL 29/37, Visuospatial skills improved to 83/105, low end of WNL, from 58. Clock drawing remains impaired, with decreased planning, organization and problem solving seen, although pt had better awareness of his errors. SLP educated re: findings and talked with pt about returning to work on a graduated basis, as he is compensating well with a slower rate and double-checking for errors. SLP educated that deficit areas are more of a concern for driving, due to need for quick reaction time. Provided information re: driving evaluation and discussed that such an evaluation may be appropriate since pt is very eager to return to driving. He is to discuss this further with OT later this week. Discussed plan of care as pt has only one appointment remaining. Pt wishes to conclude with ST after next session; we will address mod complex conversation and executive function deficits.       Assessment / Recommendations / Plan   Plan  Continue with current plan of care      Progression Toward Goals   Progression toward goals  Progressing toward goals       SLP Education - 08/11/19 1041    Education Details  driving evaluation information (see pt instructions)    Person(s) Educated  Patient;Other (comment)    Methods  Explanation;Handout    Comprehension  Verbalized understanding       SLP Short Term Goals - 08/11/19 0917      SLP SHORT TERM GOAL #1   Title  Pt will maintain selective attention for 15 minutes to a functional task (such as meal planning, billpaying) a moderately distracting environment x3 visits.    Baseline  06/25/19, 07/07/19, 07/09/19    Time  2     Period  Weeks    Status  Achieved      SLP SHORT TERM GOAL #2   Title  Pt will complete further assessment of language, including reading/writing, with goals added as needed.    Baseline  06/22/19    Time  2    Period  Weeks    Status  Achieved      SLP SHORT TERM GOAL #3   Title  Pt will tell SLP 3 cognitive deficits.    Time  1    Period  Weeks    Status  Not Met      SLP SHORT TERM GOAL #4   Title  Pt will utilize compensations for anomia in simple conversation with rare min  A over 2 sessions    Baseline  07/28/19 07/30/19    Time  2    Period  Weeks    Status  Achieved   simple conversation [facility]     SLP SHORT TERM GOAL #5   Title  Pt will write simple sentences with extended time and occasional min A.    Baseline  07/28/19 07/30/19    Time  2    Period  Weeks    Status  Achieved       SLP Long Term Goals - 08/11/19 1044      SLP LONG TERM GOAL #1   Title  Pt will use external aids/notebook to manage finances, schedule, meal-planning x3 sessions.    Baseline  07/28/19, 08/04/19    Time  5    Period  Weeks    Status  On-going      SLP LONG TERM GOAL #2   Title  Pt will correct 75% of errors in functional therapy tasks with rare min A for double checking.    Time  5    Period  Weeks    Status  Achieved      SLP LONG TERM GOAL #3   Title  Pt will use compensations functionally (slowed rate, preplanning) in phone call scenarios x3 sessions.    Status  Deferred      SLP LONG TERM GOAL #4   Title  Pt will utilize compensations for aphasia over 15 minute moderately complex conversation with rare min A over 2 sessions    Baseline  08/06/19    Time  5    Period  Weeks    Status  On-going       Plan - 08/11/19 1042    Clinical Impression Statement  Pt presents with improving cognitive communication and expressive language deficits, which appear more in range of mild today. Mild stuttering, hesistations in mod complex conversation today. Slow processing persists in all  tasks, as well as decreased awareness of deficits and functional impacts. See skilled intervention for details and reassessment with CLQT. Pt given information re: OT driving evaluation today. Anticipate d/c next session as pt reports pleased with current level of function. I recommend skilled ST to address cognitive and language impairments in order to improve safety, independence and for possible return to work.    Speech Therapy Frequency  2x / week    Duration  --   8 weeks or 17 visits   Treatment/Interventions  Compensatory strategies;Patient/family education;Functional tasks;Cueing hierarchy;Environmental controls;Cognitive reorganization;Multimodal communcation approach;Internal/external aids;Compensatory techniques;Language facilitation;SLP instruction and feedback    Potential to Achieve Goals  Good    Potential Considerations  Ability to learn/carryover information    Consulted and Agree with Plan of Care  Patient;Family member/caregiver    Family Member Consulted  brother       Patient will benefit from skilled therapeutic intervention in order to improve the following deficits and impairments:   Cognitive communication deficit  Aphasia    Problem List Patient Active Problem List   Diagnosis Date Noted  . Vertebral artery stenosis 07/21/2019  . Vertebral artery narrowing, left 07/20/2019  . Non-fluent aphasia   . Acute bilateral cerebral infarction in a watershed distribution [facility]) 05/14/2019  . Vertebral artery narrowing, right 05/12/2019  . Hyperlipidemia 05/06/2019  . CVA (cerebral vascular accident) ([place]) 05/05/2019  . Internal carotid artery occlusion, bilateral   . Covid-19 Virus not Detected   . Status post total replacement of left hip 08/23/2017  .  Unilateral primary osteoarthritis, left hip 05/02/2017  . Status post total replacement of right hip 03/22/2017  . PVCs (premature ventricular contractions) 03/14/2017  . Essential hypertension 03/14/2017  .  Unilateral primary osteoarthritis, right hip 03/08/2017  . Bilateral hip pain 10/15/2016  . Abnormality of gait 10/15/2016   Speech Therapy Progress Note  Dates of Reporting Period:05/25/19 to 08/11/19  Pt has been seen for 10 speech therapy sessions. See goals and clinical impressions above.   Deneise Lever, [place], [place] 08/11/2019, 10:46 AM  [facility] [address] [place], [name], [address] Phone: [phone]   Fax:  [phone]   Name: Abubakar Crispo MRN: [id number] Date of Birth: 1951-02-07

## 2019-08-13 ENCOUNTER — Other Ambulatory Visit: Payer: Self-pay

## 2019-08-13 ENCOUNTER — Ambulatory Visit: Payer: Commercial Managed Care - PPO | Admitting: Occupational Therapy

## 2019-08-13 ENCOUNTER — Ambulatory Visit: Payer: Commercial Managed Care - PPO | Admitting: Physical Therapy

## 2019-08-13 ENCOUNTER — Ambulatory Visit: Payer: Commercial Managed Care - PPO | Admitting: Speech Pathology

## 2019-08-13 ENCOUNTER — Encounter: Payer: Self-pay | Admitting: Physical Therapy

## 2019-08-13 DIAGNOSIS — R2681 Unsteadiness on feet: Secondary | ICD-10-CM

## 2019-08-13 DIAGNOSIS — R2689 Other abnormalities of gait and mobility: Secondary | ICD-10-CM

## 2019-08-13 DIAGNOSIS — I69318 Other symptoms and signs involving cognitive functions following cerebral infarction: Secondary | ICD-10-CM

## 2019-08-13 DIAGNOSIS — M6281 Muscle weakness (generalized): Secondary | ICD-10-CM

## 2019-08-13 DIAGNOSIS — R4701 Aphasia: Secondary | ICD-10-CM

## 2019-08-13 DIAGNOSIS — R41842 Visuospatial deficit: Secondary | ICD-10-CM

## 2019-08-13 DIAGNOSIS — R41841 Cognitive communication deficit: Secondary | ICD-10-CM

## 2019-08-13 NOTE — Therapy (Signed)
[facility] [address] [place], [name], [address] Phone: [phone]   Fax:  [phone]  Occupational Therapy Treatment  Patient Details  Name: Jose Kaufman MRN: [id number] Date of Birth: [birth year] Referring Provider (OT): Dr. Letta Pate   Encounter Date: 08/13/2019  OT End of Session - 08/13/19 1315    Visit Number  7    Number of Visits  17    Date for OT Re-Evaluation  08/27/19    Authorization Type  [place] - Visit Number  7    Authorization - Number of Visits  15    OT Start Time  [id number]    OT Stop Time  1315    OT Time Calculation (min)  42 min    Activity Tolerance  Patient tolerated treatment well    Behavior During Therapy  [facility] for tasks assessed/performed       Past Medical History:  Diagnosis Date  . Abnormality of gait 10/15/2016  . Arthritis   . Dysrhythmia    hx of  . Essential hypertension 03/14/2017  . PVCs (premature ventricular contractions) 03/14/2017  . Skin cancer    Skin  . Stroke [facility])    Mini strokes . 06/25/2019  Expressive aphasia - improving  . Wears glasses     Past Surgical History:  Procedure Laterality Date  . COLONOSCOPY    . IR ANGIO INTRA EXTRACRAN SEL COM CAROTID INNOMINATE BILAT MOD SED  05/07/2019  . IR ANGIO INTRA EXTRACRAN SEL COM CAROTID INNOMINATE BILAT MOD SED  07/20/2019  . IR ANGIO VERTEBRAL SEL SUBCLAVIAN INNOMINATE BILAT MOD SED  05/07/2019  . IR ANGIO VERTEBRAL SEL SUBCLAVIAN INNOMINATE UNI R MOD SED  07/20/2019  . IR PATIENT EVAL TECH 0-60 MINS  06/08/2019  . IR TRANSCATH EXCRAN VERT OR CAR A STENT  05/12/2019  . IR TRANSCATH EXCRAN VERT OR CAR A STENT  07/20/2019  . IR US GUIDE VASC ACCESS RIGHT  05/12/2019  . RADIOLOGY WITH ANESTHESIA N/A 05/08/2019   Procedure: RADIOLOGY WITH ANESTHESIA;  Surgeon: [name], MD;  Location: [place];  Service: Radiology;  Laterality: N/A;  . RADIOLOGY WITH ANESTHESIA N/A 05/12/2019   Procedure: carotid  stenting;  Surgeon: [name], MD;  Location: [place];  Service: Radiology;  Laterality: N/A;  . RADIOLOGY WITH ANESTHESIA N/A 06/08/2019   Procedure: STENT PLACEMENT;  Surgeon: [name], MD;  Location: [place];  Service: Radiology;  Laterality: N/A;  . RADIOLOGY WITH ANESTHESIA N/A 07/20/2019   Procedure: RADIOLOGY WITH ANESTHESIA STENTING;  Surgeon: [name], MD;  Location: [facility];  Service: Radiology;  Laterality: N/A;  . SKIN CANCER EXCISION    . TOTAL HIP ARTHROPLASTY Right 03/22/2017   Procedure: RIGHT TOTAL HIP ARTHROPLASTY ANTERIOR APPROACH;  Surgeon: Mcarthur Rossetti, MD;  [facility];  Service: Orthopedics;  Laterality: Right;  . TOTAL HIP ARTHROPLASTY Left 08/23/2017   Procedure: LEFT TOTAL HIP ARTHROPLASTY ANTERIOR APPROACH;  Surgeon: Mcarthur Rossetti, MD;  [facility];  Service: Orthopedics;  Laterality: Left;  . WISDOM TOOTH EXTRACTION      There were no vitals filed for this visit.  Subjective Assessment - 08/13/19 1251    Subjective   I want to get back to work and driving, and I think I can    Patient is accompanied by:  Family member    Pertinent History  05/05/19: acute Lt frontal and parietal watershed infarcts. Pt also had old corticol and subcortical infarcts  in both frontal and parietal regions, Bil THA 2018, HTN    Limitations  No driving, not currently working,  since surgery - no lifting > 10 lbs, no bending or stooping    Currently in Pain?  No/denies       Discussed concerns with driving including: decreased processing speed and reaction time, decreased ability to divide attention, and decreased scanning to far Rt/Lt. Pt was given driving evaluation information from speech therapist and pt reports he still has this info. Pt given [facility] services information including [place] contact info and filled out referral form for patient. Pt to call [facility] to get driving evaluation set up and will either hand deliver referral to  [place] or have [place] fax referral at next appointment.  Pt performed physical task while performing environmental scanning - but pt had to completely stop to look for things in environment and unable to do simultaneously while walking and tossing ball, even with cueing. Pt also noted to shuffle feet w/ starting or stopping movement.  Tabletop scanning (letter cancellation) with 100% accuracy w/ extra time required.                       OT Short Term Goals - 07/29/19 1000      OT SHORT TERM GOAL #1   Title  Independent with HEP for coordination - 06/26/19    Time  4    Period  Weeks    Status  Achieved      OT SHORT TERM GOAL #2   Title  Improve coordination bilaterally by 5 sec. or more    Baseline  Rt = 38.47 sec, Lt = 33.93 sec    Time  4    Period  Weeks    Status  On-going   07/29/19:  R-35.12sec, L-31.40sec     OT SHORT TERM GOAL #3   Title  Pt to perform tabletop visual scanning to far Rt and Lt side with no more than min questioning cues    Time  4    Period  Weeks    Status  On-going      OT SHORT TERM GOAL #4   Title  Pt to perform environmental scanning at 75% accuracy in prep for potential driving    Time  4    Period  Weeks    Status  New        OT Long Term Goals - 07/27/19 [id number]      OT LONG TERM GOAL #1   Title  Pt to perform simple stovetop meal safely with distant supervision - 07/27/19    Time  8    Period  Weeks    Status  New      OT LONG TERM GOAL #2   Title  Pt to perform environmental scanning at 90% accuracy while performing simple physical task for multi-tasking and in prep for driving    Time  8    Period  Weeks    Status  New      OT LONG TERM GOAL #3   Title  Pt to assemble pipe design in prep for functional assembly projects at home w/o cues    Time  8    Period  Weeks    Status  Achieved      OT LONG TERM GOAL #4   Title  Pt to perform computer tasks including searches at mod I level    Time  8  Period  Weeks     Status  New      OT LONG TERM GOAL #5   Title  Pt to type 10 wpm at 80% or greater accuracy (numbers when called out for work simulated tasks)    Time  8    Period  Weeks    Status  New            Plan - 08/13/19 1432    Clinical Impression Statement  Pt with improved cognition per speech reassessments, however pt still demo decreased ability to multi-task/divide attention w/ environmental scanning, and still demo decreased processing speed. Pt also demo shuffling gait    Occupational performance deficits (Please refer to evaluation for details):  ADL's;IADL's;Work;Social Participation    Body Structure / Function / Physical Skills  IADL;Sensation;Strength;UE functional use;FMC;Coordination;GMC;Balance    Cognitive Skills  Attention;Safety Awareness;Sequencing;Understand;Memory;Perception;Problem Solve    Rehab Potential  Fair    OT Frequency  2x / week    OT Duration  8 weeks    OT Treatment/Interventions  Self-care/ADL training;Therapeutic exercise;Functional Mobility Training;Neuromuscular education;Manual Therapy;Therapeutic activities;DME and/or AE instruction;Cognitive remediation/compensation;Visual/perceptual remediation/compensation;Patient/family education;Passive range of motion    Plan  continue environmental scanning, check progress towards STG's/LTG's, if time - cooking task    Consulted and Agree with Plan of Care  Patient;Family member/caregiver    Family Member Consulted  brother       Patient will benefit from skilled therapeutic intervention in order to improve the following deficits and impairments:   Body Structure / Function / Physical Skills: IADL, Sensation, Strength, UE functional use, FMC, Coordination, GMC, Balance Cognitive Skills: Attention, Safety Awareness, Sequencing, Understand, Memory, Perception, Problem Solve     Visit Diagnosis: Other symptoms and signs involving cognitive functions following cerebral infarction  Visuospatial  deficit  Unsteadiness on feet    Problem List Patient Active Problem List   Diagnosis Date Noted  . Vertebral artery stenosis 07/21/2019  . Vertebral artery narrowing, left 07/20/2019  . Non-fluent aphasia   . Acute bilateral cerebral infarction in a watershed distribution [facility]) 05/14/2019  . Vertebral artery narrowing, right 05/12/2019  . Hyperlipidemia 05/06/2019  . CVA (cerebral vascular accident) ([place]) 05/05/2019  . Internal carotid artery occlusion, bilateral   . Covid-19 Virus not Detected   . Status post total replacement of left hip 08/23/2017  . Unilateral primary osteoarthritis, left hip 05/02/2017  . Status post total replacement of right hip 03/22/2017  . PVCs (premature ventricular contractions) 03/14/2017  . Essential hypertension 03/14/2017  . Unilateral primary osteoarthritis, right hip 03/08/2017  . Bilateral hip pain 10/15/2016  . Abnormality of gait 10/15/2016    Carey Bullocks, OTR/L 08/13/2019, 2:35 PM  [facility] [address] [place], [name], [address] Phone: [phone]   Fax:  [phone]  Name: Jose Kaufman MRN: [id number] Date of Birth: 1951/05/21

## 2019-08-13 NOTE — Patient Instructions (Signed)
Cognitive Home Program  -Spend at least 30 minutes a day doing some kind of cognitive activity  - Reading, journaling - Have an engaging conversation, talk about current events - games like - Solitaire, scrabble, crosswords, board games, checkers  Remember compensations to help you with your speech and attention -Slow down -Find a replacement word -[place] your place when you're working on something -Double check yourself

## 2019-08-13 NOTE — Therapy (Signed)
[facility] [address] [place], [name], [address] Phone: [phone]   Fax:  [phone]  Physical Therapy Treatment  Patient Details  Name: Jose Kaufman MRN: [id number] Date of Birth: July 20, 1951 Referring Provider (PT): Leonie Man   Encounter Date: 08/13/2019  CLINIC OPERATION CHANGES: Outpatient [facility] is open at lower capacity following universal masking, social distancing, and patient screening.  The patient's COVID risk of complications score is 3.    PT End of Session - 08/13/19 1455    Visit Number  8    Number of Visits  10   recert [id number]   Date for PT Re-Evaluation  09/12/19    Authorization Type  UME; 30 VL (PT/OT/aquatic counts as one visit on the same day HARD MAX); speech is separate 30 VL    Authorization - Visit Number  8   Should be 6th PT visit today-previous visit miscounted   Authorization - Number of Visits  15    PT Start Time  1319    PT Stop Time  1401    PT Time Calculation (min)  42 min    Activity Tolerance  Patient tolerated treatment well    Behavior During Therapy  WFL for tasks assessed/performed       Past Medical History:  Diagnosis Date  . Abnormality of gait 10/15/2016  . Arthritis   . Dysrhythmia    hx of  . Essential hypertension 03/14/2017  . PVCs (premature ventricular contractions) 03/14/2017  . Skin cancer    Skin  . Stroke [facility])    Mini strokes . 06/25/2019  Expressive aphasia - improving  . Wears glasses     Past Surgical History:  Procedure Laterality Date  . COLONOSCOPY    . IR ANGIO INTRA EXTRACRAN SEL COM CAROTID INNOMINATE BILAT MOD SED  05/07/2019  . IR ANGIO INTRA EXTRACRAN SEL COM CAROTID INNOMINATE BILAT MOD SED  07/20/2019  . IR ANGIO VERTEBRAL SEL SUBCLAVIAN INNOMINATE BILAT MOD SED  05/07/2019  . IR ANGIO VERTEBRAL SEL SUBCLAVIAN INNOMINATE UNI R MOD SED  07/20/2019  . IR PATIENT EVAL TECH 0-60 MINS  06/08/2019  . IR TRANSCATH EXCRAN VERT OR CAR A  STENT  05/12/2019  . IR TRANSCATH EXCRAN VERT OR CAR A STENT  07/20/2019  . IR US GUIDE VASC ACCESS RIGHT  05/12/2019  . RADIOLOGY WITH ANESTHESIA N/A 05/08/2019   Procedure: RADIOLOGY WITH ANESTHESIA;  Surgeon: [name], MD;  Location: [place];  Service: Radiology;  Laterality: N/A;  . RADIOLOGY WITH ANESTHESIA N/A 05/12/2019   Procedure: carotid stenting;  Surgeon: [name], MD;  Location: [place];  Service: Radiology;  Laterality: N/A;  . RADIOLOGY WITH ANESTHESIA N/A 06/08/2019   Procedure: STENT PLACEMENT;  Surgeon: [name], MD;  Location: [place];  Service: Radiology;  Laterality: N/A;  . RADIOLOGY WITH ANESTHESIA N/A 07/20/2019   Procedure: RADIOLOGY WITH ANESTHESIA STENTING;  Surgeon: [name], MD;  Location: [place];  Service: Radiology;  Laterality: N/A;  . SKIN CANCER EXCISION    . TOTAL HIP ARTHROPLASTY Right 03/22/2017   Procedure: RIGHT TOTAL HIP ARTHROPLASTY ANTERIOR APPROACH;  Surgeon: Mcarthur Rossetti, MD;  Location[facility];  Service: Orthopedics;  Laterality: Right;  . TOTAL HIP ARTHROPLASTY Left 08/23/2017   Procedure: LEFT TOTAL HIP ARTHROPLASTY ANTERIOR APPROACH;  Surgeon: Mcarthur Rossetti, MD;  Location[facility];  Service: Orthopedics;  Laterality: Left;  . WISDOM TOOTH EXTRACTION      There were no vitals filed for  this visit.  Subjective Assessment - 08/13/19 1322    Subjective  No changes.  Had my last day with speech therapy today.    Pertinent History  Hx of bilateral hip replacement 2018, hx of OA    Patient Stated Goals  Pt's goal for therapy is to walk better.    Currently in Pain?  No/denies                       [facility] Adult PT Treatment/Exercise - 08/13/19 0001      Ambulation/Gait   Ambulation/Gait  Yes    Ambulation/Gait Assistance  6: Modified independent (Device/Increase time)    Ambulation Distance (Feet)  600 Feet    Assistive device  None    Gait Pattern  Step-through pattern;Decreased arm swing  - right;Decreased arm swing - left;Shuffle    Ambulation Surface  Level;Indoor    Gait Comments  Gait with conversation tasks, then with ball toss/catch, no LOB.  With unplanned obstacles during gait, pt able to stop quickly and restart gait without LOB        Therapeutic Exercise Reviewed pt's HEP, beginning with sit<>stand with arms in front x 10 reps, the progressed to  Sit to Stand with Hands on Knees - 10 reps with cues for upright posture hold 3 seconds Forward Step Up - 10 reps UE support, cues to lessen UE support  Lateral Step Up - 10 reps   Forward Step Down Touch with Heel - 10 reps   Backwards Walking/Forwards walking - 10 ft, 3 reps Walking Tandem Stance - 10 ft along counter, 6 reps with UE support, cues for upright posture, visual target looking ahead  Added to HEP 08/13/2019 Side Stepping with Counter Support - 3 sets  10 ft each direction  Also performed heel/toe raises x 5 reps at counter, sidestep R<>L, 10 reps for hip strengthening; then stagger stance forward/back weightshifting x 10 reps      PT Education - 08/13/19 1451    Education Details  Modifications to HEP- see instructions    Person(s) Educated  Patient    Methods  Explanation;Demonstration;Handout   updated handout   Comprehension  Verbalized understanding;Returned demonstration          PT Long Term Goals - 08/13/19 1503      PT LONG TERM GOAL #1   Title  Pt will be independent with HEP for improved balance, strength, and gait.  TARGET 08/21/2019    Time  1    Period  Weeks    Status  On-going    Target Date  08/21/19      PT LONG TERM GOAL #2   Title  Pt will improve 5x sit<>stand to less than or equal to 11.5 sec for improved functional strength.    Time  1    Period  Weeks    Status  On-going    Target Date  08/21/19      PT LONG TERM GOAL #3   Title  Pt will improve TUG score to less than or equal to 13.5 seconds for decreased fall risk.    Time  1    Period  Weeks    Status   On-going    Target Date  08/21/19      PT LONG TERM GOAL #4   Title  Pt will improve DGI score to at least 16/24 for decreased fall risk.    Time  4    Period  Weeks    Status  Revised    Target Date  08/21/19      PT LONG TERM GOAL #5   Title  Pt will verbalize understanding of fall prevention in home environment.    Time  4    Period  Weeks    Status  Achieved            Plan - 08/13/19 1459    Clinical Impression Statement  Focused majority of session fully reviewing HEP and making slight modifications to HEP.  Addressed dynamic activities with gait, including unplanned obstacles with quick/start and stops, with pt not experiencing any LOB.  Pt will continue to benefit from skilled PT to work towards LTGs-(extended x 1 week to include remaining scheduled PT appointments).    Personal Factors and Comorbidities  Comorbidity 2    Comorbidities  Hx of OA, hx of bilateral hip replacements    Examination-Activity Limitations  Locomotion Level;Stairs    Examination-Participation Restrictions  Community Activity    Stability/Clinical Decision Making  Stable/Uncomplicated    Rehab Potential  Good    PT Frequency  2x / week    PT Duration  Other (comment)   1 week, per recert [id number]   PT Treatment/Interventions  ADLs/Self Care Home Management;Gait training;Stair training;Functional mobility training;Therapeutic activities;Therapeutic exercise;Balance training;Neuromuscular re-education;Patient/family education    PT Next Visit Plan  Continue towards LTGs; review sidestepping addition to HEP, progress HEP as needed.    PT Home Exercise Plan  Access Code: [email or web address]    Consulted and Agree with Plan of Care  Patient;Family member/caregiver    Family Member Consulted  brother       Patient will benefit from skilled therapeutic intervention in order to improve the following deficits and impairments:  Abnormal gait, Difficulty walking, Decreased balance, Decreased mobility, Decreased  strength  Visit Diagnosis: Muscle weakness (generalized)  Unsteadiness on feet  Other abnormalities of gait and mobility     Problem List Patient Active Problem List   Diagnosis Date Noted  . Vertebral artery stenosis 07/21/2019  . Vertebral artery narrowing, left 07/20/2019  . Non-fluent aphasia   . Acute bilateral cerebral infarction in a watershed distribution [facility]) 05/14/2019  . Vertebral artery narrowing, right 05/12/2019  . Hyperlipidemia 05/06/2019  . CVA (cerebral vascular accident) ([place]) 05/05/2019  . Internal carotid artery occlusion, bilateral   . Covid-19 Virus not Detected   . Status post total replacement of left hip 08/23/2017  . Unilateral primary osteoarthritis, left hip 05/02/2017  . Status post total replacement of right hip 03/22/2017  . PVCs (premature ventricular contractions) 03/14/2017  . Essential hypertension 03/14/2017  . Unilateral primary osteoarthritis, right hip 03/08/2017  . Bilateral hip pain 10/15/2016  . Abnormality of gait 10/15/2016    Manpreet Strey W. 08/13/2019, 3:07 PM  Frazier Butt., PT  [facility] [address] [place], [name], [address] Phone: [phone]   Fax:  [phone]  Name: Jose Kaufman MRN: [id number] Date of Birth: [birth year]

## 2019-08-13 NOTE — Patient Instructions (Addendum)
Access Code: [email or web address]  URL: [email or web address]  Date: 08/13/2019  Prepared by: Mady Haagensen   Exercises  Sit to Stand with Hands on Knees - 10 reps - 2 sets - 1x daily - 6x weekly  Step Up - 10 reps - 2 sets - 2x daily - 6x weekly  Lateral Step Up - 10 reps - 2 sets - 2x daily - 6x weekly  Forward Step Down Touch with Heel - 10 reps - 3 sets - 2x daily - 6x weekly  Backwards Walking - 10 reps - 3 sets - 2x daily - 6x weekly  Walking Tandem Stance - 10 reps - 3 sets - 2x daily - 6x weekly   Added 08/13/2019 Side Stepping with Counter Support - 3 sets - 1x daily - 6x weekly

## 2019-08-13 NOTE — Therapy (Signed)
[facility] [address] [place], [name], [address] Phone: [phone]   Fax:  [phone]  Speech Language Pathology Treatment and Discharge Summary  Patient Details  Name: Jose Kaufman MRN: [id number] Date of Birth: October 07, 1951 Referring Provider (SLP): Dr.Kirsteins   Encounter Date: 08/13/2019  End of Session - 08/13/19 1253    Visit Number  11    Number of Visits  17    Date for SLP Re-Evaluation  09/26/19    Authorization Type  [facility], 30 visits for OT/PT, 30 separate visits for ST    Authorization - Visit Number  10    Authorization - Number of Visits  30    SLP Start Time  3744    SLP Stop Time   1226    SLP Time Calculation (min)  39 min    Activity Tolerance  Patient tolerated treatment well       Past Medical History:  Diagnosis Date  . Abnormality of gait 10/15/2016  . Arthritis   . Dysrhythmia    hx of  . Essential hypertension 03/14/2017  . PVCs (premature ventricular contractions) 03/14/2017  . Skin cancer    Skin  . Stroke [facility])    Mini strokes . 06/25/2019  Expressive aphasia - improving  . Wears glasses     Past Surgical History:  Procedure Laterality Date  . COLONOSCOPY    . IR ANGIO INTRA EXTRACRAN SEL COM CAROTID INNOMINATE BILAT MOD SED  05/07/2019  . IR ANGIO INTRA EXTRACRAN SEL COM CAROTID INNOMINATE BILAT MOD SED  07/20/2019  . IR ANGIO VERTEBRAL SEL SUBCLAVIAN INNOMINATE BILAT MOD SED  05/07/2019  . IR ANGIO VERTEBRAL SEL SUBCLAVIAN INNOMINATE UNI R MOD SED  07/20/2019  . IR PATIENT EVAL TECH 0-60 MINS  06/08/2019  . IR TRANSCATH EXCRAN VERT OR CAR A STENT  05/12/2019  . IR TRANSCATH EXCRAN VERT OR CAR A STENT  07/20/2019  . IR US GUIDE VASC ACCESS RIGHT  05/12/2019  . RADIOLOGY WITH ANESTHESIA N/A 05/08/2019   Procedure: RADIOLOGY WITH ANESTHESIA;  Surgeon: [name], MD;  Location: [place];  Service: Radiology;  Laterality: N/A;  . RADIOLOGY WITH ANESTHESIA N/A 05/12/2019   Procedure:  carotid stenting;  Surgeon: [name], MD;  Location: [facility];  Service: Radiology;  Laterality: N/A;  . RADIOLOGY WITH ANESTHESIA N/A 06/08/2019   Procedure: STENT PLACEMENT;  Surgeon: [name], MD;  Location: [facility];  Service: Radiology;  Laterality: N/A;  . RADIOLOGY WITH ANESTHESIA N/A 07/20/2019   Procedure: RADIOLOGY WITH ANESTHESIA STENTING;  Surgeon: [name], MD;  Location: [facility];  Service: Radiology;  Laterality: N/A;  . SKIN CANCER EXCISION    . TOTAL HIP ARTHROPLASTY Right 03/22/2017   Procedure: RIGHT TOTAL HIP ARTHROPLASTY ANTERIOR APPROACH;  Surgeon: Mcarthur Rossetti, MD;  [facility];  Service: Orthopedics;  Laterality: Right;  . TOTAL HIP ARTHROPLASTY Left 08/23/2017   Procedure: LEFT TOTAL HIP ARTHROPLASTY ANTERIOR APPROACH;  Surgeon: Mcarthur Rossetti, MD;  [facility];  Service: Orthopedics;  Laterality: Left;  . WISDOM TOOTH EXTRACTION      There were no vitals filed for this visit.  Subjective Assessment - 08/13/19 1153    Subjective  "I need to start taking things slower and get a replacement word."    Patient is accompained by:  Family member            ADULT SLP TREATMENT - 08/13/19 [place]  Behavior/Cognition  Alert;Cooperative      Treatment Provided   Treatment provided  Cognitive-Linquistic      Pain Assessment   Pain Assessment  No/denies pain      Cognitive-Linquistic Treatment   Treatment focused on  Cognition;Aphasia    Skilled Treatment  Pt reports calling [facility] and [employer] and had "long conversations," without difficulty. Has new cell phone and has been texting brother without issue. Continues to keep track of his schedule and appointments with calendar at home. Pt told SLP strategies he is using for his attention including giving himself more time, using checklists, and double checking his work. Homework was correct, and pt noted to cross off items to ensure  he did not miss anything. In mod complex conversation today re: work, hobbies/interests, pt used slow rate and word substitions effectively. He still has hesitations and mild stuttering, however he is compensating well for this. Pt and brother in agreement with d/c at this time.      Assessment / Recommendations / Plan   Plan  Discharge SLP treatment due to (comment)   goals met; pt satisfied with current level of function     Progression Toward Goals   Progression toward goals  Goals met, education completed, patient discharged from [facility] - 08/13/19 1252    Education Details  accommodations for work (gradual resumption of duties, allow extra time), recommend driving evaluation    Person(s) Educated  Patient   brother   Methods  Explanation    Comprehension  Verbalized understanding       SLP Short Term Goals - 08/13/19 1156      SLP SHORT TERM GOAL #1   Title  Pt will maintain selective attention for 15 minutes to a functional task (such as meal planning, billpaying) a moderately distracting environment x3 visits.    Baseline  06/25/19, 07/07/19, 07/09/19    Time  2    Period  Weeks    Status  Achieved      SLP SHORT TERM GOAL #2   Title  Pt will complete further assessment of language, including reading/writing, with goals added as needed.    Baseline  06/22/19    Time  2    Period  Weeks    Status  Achieved      SLP SHORT TERM GOAL #3   Title  Pt will tell SLP 3 cognitive deficits.    Time  1    Period  Weeks    Status  Not Met      SLP SHORT TERM GOAL #4   Title  Pt will utilize compensations for anomia in simple conversation with rare min A over 2 sessions    Baseline  07/28/19 07/30/19    Time  2    Period  Weeks    Status  Achieved   simple conversation [facility]     SLP SHORT TERM GOAL #5   Title  Pt will write simple sentences with extended time and occasional min A.    Baseline  07/28/19 07/30/19    Time  2    Period  Weeks    Status  Achieved        SLP Long Term Goals - 08/13/19 1157      SLP LONG TERM GOAL #1   Title  Pt will use external aids/notebook to manage finances, schedule, meal-planning x3 sessions.    Baseline  07/28/19, 08/04/19, 08/13/19  Time  5    Period  Weeks    Status  Achieved      SLP LONG TERM GOAL #2   Title  Pt will correct 75% of errors in functional therapy tasks with rare min A for double checking.    Time  5    Period  Weeks    Status  Achieved      SLP LONG TERM GOAL #3   Title  Pt will use compensations functionally (slowed rate, preplanning) in phone call scenarios x3 sessions.    Status  Deferred      SLP LONG TERM GOAL #4   Title  Pt will utilize compensations for aphasia over 15 minute moderately complex conversation with rare min A over 2 sessions    Baseline  08/06/19, 08/13/19    Time  5    Period  Weeks    Status  Achieved       Plan - 08/13/19 1254    Clinical Impression Statement  Pt has demonstrated improvement with cognitive communication and expressive language deficits, which are overall mild. Mild stuttering, hesistations however mod complex conversation is functional. Slow processing persists in all tasks, pt is using compensations for attention deficits effectively. Feel pt's workplace would be supportive of accommodations including gradual return, lighter workload and more time for slow processing to allow pt to return in at least a partial capacity. Have advised pt have driving evaluation if he wishes to drive, primarily due to slow processing. Pt reports pleased with current level of function and in agreement with d/c today.    Speech Therapy Frequency  --   d/c   Duration  --   d/c   Treatment/Interventions  Compensatory strategies;Patient/family education;Functional tasks;Cueing hierarchy;Environmental controls;Cognitive reorganization;Multimodal communcation approach;Internal/external aids;Compensatory techniques;Language facilitation;SLP instruction and feedback     Potential to Achieve Goals  Good    Potential Considerations  Ability to learn/carryover information    Consulted and Agree with Plan of Care  Patient;Family member/caregiver    Family Member Consulted  brother       Patient will benefit from skilled therapeutic intervention in order to improve the following deficits and impairments:   Cognitive communication deficit  Aphasia    Problem List Patient Active Problem List   Diagnosis Date Noted  . Vertebral artery stenosis 07/21/2019  . Vertebral artery narrowing, left 07/20/2019  . Non-fluent aphasia   . Acute bilateral cerebral infarction in a watershed distribution [facility]) 05/14/2019  . Vertebral artery narrowing, right 05/12/2019  . Hyperlipidemia 05/06/2019  . CVA (cerebral vascular accident) ([place]) 05/05/2019  . Internal carotid artery occlusion, bilateral   . Covid-19 Virus not Detected   . Status post total replacement of left hip 08/23/2017  . Unilateral primary osteoarthritis, left hip 05/02/2017  . Status post total replacement of right hip 03/22/2017  . PVCs (premature ventricular contractions) 03/14/2017  . Essential hypertension 03/14/2017  . Unilateral primary osteoarthritis, right hip 03/08/2017  . Bilateral hip pain 10/15/2016  . Abnormality of gait 10/15/2016   SPEECH THERAPY DISCHARGE SUMMARY  Visits from Start of Care: 11  Current functional level related to goals / functional outcomes: Pt has met 3/3 active LTGs, using compensations for aphasia and cognitive deficits effectively for mod complex conversation/scenarios.   Remaining deficits: Mild cognitive deficits, aphasia, stuttering   Education / Equipment: Cognitive home program, compensations for decreased attention, aphasia, recommend driving evaluation  Plan: Patient agrees to discharge.  Patient goals were met. Patient is being discharged due to  meeting the stated rehab goals.  ?????         Deneise Lever, [place], CCC-SLP Speech-Language  Pathologist  Aliene Altes 08/13/2019, 12:59 PM  [facility] [address] [place], [name], [address] Phone: [phone]   Fax:  [phone]   Name: Jose Kaufman MRN: [id number] Date of Birth: 1951-12-04

## 2019-08-13 NOTE — Addendum Note (Signed)
Addended by: Frazier Butt on: 08/13/2019 03:23 PM   Modules accepted: Orders

## 2019-08-18 ENCOUNTER — Ambulatory Visit: Payer: Commercial Managed Care - PPO | Admitting: Occupational Therapy

## 2019-08-18 ENCOUNTER — Ambulatory Visit: Payer: Commercial Managed Care - PPO | Admitting: Physical Therapy

## 2019-08-18 ENCOUNTER — Encounter: Payer: Self-pay | Admitting: Physical Therapy

## 2019-08-18 ENCOUNTER — Other Ambulatory Visit: Payer: Self-pay

## 2019-08-18 DIAGNOSIS — R278 Other lack of coordination: Secondary | ICD-10-CM

## 2019-08-18 DIAGNOSIS — R41842 Visuospatial deficit: Secondary | ICD-10-CM

## 2019-08-18 DIAGNOSIS — M6281 Muscle weakness (generalized): Secondary | ICD-10-CM

## 2019-08-18 DIAGNOSIS — I69318 Other symptoms and signs involving cognitive functions following cerebral infarction: Secondary | ICD-10-CM

## 2019-08-18 DIAGNOSIS — R2681 Unsteadiness on feet: Secondary | ICD-10-CM | POA: Diagnosis not present

## 2019-08-18 DIAGNOSIS — R41840 Attention and concentration deficit: Secondary | ICD-10-CM

## 2019-08-18 DIAGNOSIS — R2689 Other abnormalities of gait and mobility: Secondary | ICD-10-CM

## 2019-08-18 NOTE — Therapy (Signed)
[place] [address] [place], [name], [address] Phone: [phone]   Fax:  [phone]  Physical Therapy Treatment  Patient Details  Name: Jose Kaufman MRN: [id number] Date of Birth: 01/02/1951 Referring Provider (PT): Leonie Man   Encounter Date: 08/18/2019  CLINIC OPERATION CHANGES: [facility] is open at lower capacity following universal masking, social distancing, and patient screening.  The patient's COVID risk of complications score is 3.   PT End of Session - 08/18/19 0936    Visit Number  9    Number of Visits  10   recert [id number]   Date for PT Re-Evaluation  09/12/19    Authorization Type  UME; 30 VL (PT/OT/aquatic counts as one visit on the same day HARD MAX); speech is separate 30 VL    Authorization - Visit Number  9   Should be 6th PT visit today-previous visit miscounted   Authorization - Number of Visits  15    PT Start Time  0805    PT Stop Time  0844    PT Time Calculation (min)  39 min    Activity Tolerance  Patient tolerated treatment well    Behavior During Therapy  WFL for tasks assessed/performed       Past Medical History:  Diagnosis Date  . Abnormality of gait 10/15/2016  . Arthritis   . Dysrhythmia    hx of  . Essential hypertension 03/14/2017  . PVCs (premature ventricular contractions) 03/14/2017  . Skin cancer    Skin  . Stroke [facility])    Mini strokes . 06/25/2019  Expressive aphasia - improving  . Wears glasses     Past Surgical History:  Procedure Laterality Date  . COLONOSCOPY    . IR ANGIO INTRA EXTRACRAN SEL COM CAROTID INNOMINATE BILAT MOD SED  05/07/2019  . IR ANGIO INTRA EXTRACRAN SEL COM CAROTID INNOMINATE BILAT MOD SED  07/20/2019  . IR ANGIO VERTEBRAL SEL SUBCLAVIAN INNOMINATE BILAT MOD SED  05/07/2019  . IR ANGIO VERTEBRAL SEL SUBCLAVIAN INNOMINATE UNI R MOD SED  07/20/2019  . IR PATIENT EVAL TECH 0-60 MINS  06/08/2019  . IR TRANSCATH EXCRAN VERT OR CAR A STENT   05/12/2019  . IR TRANSCATH EXCRAN VERT OR CAR A STENT  07/20/2019  . IR US GUIDE VASC ACCESS RIGHT  05/12/2019  . RADIOLOGY WITH ANESTHESIA N/A 05/08/2019   Procedure: RADIOLOGY WITH ANESTHESIA;  Surgeon: [name], MD;  Location: [place];  Service: Radiology;  Laterality: N/A;  . RADIOLOGY WITH ANESTHESIA N/A 05/12/2019   Procedure: carotid stenting;  Surgeon: [name], MD;  Location: [place];  Service: Radiology;  Laterality: N/A;  . RADIOLOGY WITH ANESTHESIA N/A 06/08/2019   Procedure: STENT PLACEMENT;  Surgeon: [name], MD;  Location: [place];  Service: Radiology;  Laterality: N/A;  . RADIOLOGY WITH ANESTHESIA N/A 07/20/2019   Procedure: RADIOLOGY WITH ANESTHESIA STENTING;  Surgeon: [name], MD;  Location: [place];  Service: Radiology;  Laterality: N/A;  . SKIN CANCER EXCISION    . TOTAL HIP ARTHROPLASTY Right 03/22/2017   Procedure: RIGHT TOTAL HIP ARTHROPLASTY ANTERIOR APPROACH;  Surgeon: Mcarthur Rossetti, MD;  Location: [facility];  Service: Orthopedics;  Laterality: Right;  . TOTAL HIP ARTHROPLASTY Left 08/23/2017   Procedure: LEFT TOTAL HIP ARTHROPLASTY ANTERIOR APPROACH;  Surgeon: Mcarthur Rossetti, MD;  Location: [facility];  Service: Orthopedics;  Laterality: Left;  . WISDOM TOOTH EXTRACTION      There were no vitals filed for this  visit.  Subjective Assessment - 08/18/19 0808    Subjective  No changes, no falls.  When asked, reports exercises going well.    Pertinent History  Hx of bilateral hip replacement 2018, hx of OA    Patient Stated Goals  Pt's goal for therapy is to walk better.    Currently in Pain?  No/denies                       [facility] Adult PT Treatment/Exercise - 08/18/19 0001      Ambulation/Gait   Ambulation/Gait  Yes    Ambulation/Gait Assistance  6: Modified independent (Device/Increase time)    Ambulation Distance (Feet)  400 Feet    Assistive device  None    Gait Pattern  Step-through pattern;Decreased arm  swing - right;Decreased arm swing - left;Shuffle    Ambulation Surface  Level;Indoor    Pre-Gait Activities  Standing runner's stretch at counter, 3 reps x 20 seconds, cues for technique, to address hip and lower extremity flexibility    Gait Comments  Short distance gait with sit<>stand then U-turns around cones, figure-8 turns around cones, 2-3 reps each.  Pt has initial hesitation upon standing and initiating gait.     Sit <>stand x 5 reps from chair, UE support at knees, initial cues for scooting forward in chair; then sit<>stand x 3 reps, 2 sets with initiation for 10-20 ft distance with gait and turns.     Balance Exercises - 08/18/19 0810      Balance Exercises: Standing   Stepping Strategy  Anterior;Posterior;Foam/compliant surface;UE support;10 reps   On balance beam, difficulty with foot clearance post step   Balance Beam  Marching in place x 10, 2 sets; then mini squats 2 sets x 10 reps, sidestepping on balance beam with UE support, 3 reps R and L along counter    Retro Gait  Upper extremity support;5 reps   Forward/back along counter, cues for step length   Sidestepping  Upper extremity support;3 reps   Along counter   Marching Limitations  2 sets of 10 reps, marching in place    Heel Raises Limitations  2 sets x 10 reps    Toe Raise Limitations  2 sets x 10 reps    Other Standing Exercises  Standing beside counter, forward/back step and weigthshift x 10 reps for limits of stability with step length (pt more limited in posterior direction stepping)              PT Long Term Goals - 08/13/19 1503      PT LONG TERM GOAL #1   Title  Pt will be independent with HEP for improved balance, strength, and gait.  TARGET 08/21/2019    Time  1    Period  Weeks    Status  On-going    Target Date  08/21/19      PT LONG TERM GOAL #2   Title  Pt will improve 5x sit<>stand to less than or equal to 11.5 sec for improved functional strength.    Time  1    Period  Weeks    Status   On-going    Target Date  08/21/19      PT LONG TERM GOAL #3   Title  Pt will improve TUG score to less than or equal to 13.5 seconds for decreased fall risk.    Time  1    Period  Weeks    Status  On-going  Target Date  08/21/19      PT LONG TERM GOAL #4   Title  Pt will improve DGI score to at least 16/24 for decreased fall risk.    Time  4    Period  Weeks    Status  Revised    Target Date  08/21/19      PT LONG TERM GOAL #5   Title  Pt will verbalize understanding of fall prevention in home environment.    Time  4    Period  Weeks    Status  Achieved            Plan - 08/18/19 0937    Clinical Impression Statement  Balance, transfers, short distance gait during session today.   Pt continues to have hesitation with initiation of gait (which pt continues to say is pre-morbid prior to CVA).  Pt requires UE support for balance activities and cues to avoid looking down at feet.    Personal Factors and Comorbidities  Comorbidity 2    Comorbidities  Hx of OA, hx of bilateral hip replacements    Examination-Activity Limitations  Locomotion Level;Stairs    Examination-Participation Restrictions  Community Activity    Stability/Clinical Decision Making  Stable/Uncomplicated    Rehab Potential  Good    PT Frequency  2x / week    PT Duration  Other (comment)   1 week, per recert [id number]   PT Treatment/Interventions  ADLs/Self Care Home Management;Gait training;Stair training;Functional mobility training;Therapeutic activities;Therapeutic exercise;Balance training;Neuromuscular re-education;Patient/family education    PT Next Visit Plan  Plan to check remaining goals and plan for d/c next visit.    PT Home Exercise Plan  Access Code: [email or web address]    Consulted and Agree with Plan of Care  Patient       Patient will benefit from skilled therapeutic intervention in order to improve the following deficits and impairments:  Abnormal gait, Difficulty walking, Decreased balance,  Decreased mobility, Decreased strength  Visit Diagnosis: Unsteadiness on feet  Other abnormalities of gait and mobility     Problem List Patient Active Problem List   Diagnosis Date Noted  . Vertebral artery stenosis 07/21/2019  . Vertebral artery narrowing, left 07/20/2019  . Non-fluent aphasia   . Acute bilateral cerebral infarction in a watershed distribution [facility]) 05/14/2019  . Vertebral artery narrowing, right 05/12/2019  . Hyperlipidemia 05/06/2019  . CVA (cerebral vascular accident) ([place]) 05/05/2019  . Internal carotid artery occlusion, bilateral   . Covid-19 Virus not Detected   . Status post total replacement of left hip 08/23/2017  . Unilateral primary osteoarthritis, left hip 05/02/2017  . Status post total replacement of right hip 03/22/2017  . PVCs (premature ventricular contractions) 03/14/2017  . Essential hypertension 03/14/2017  . Unilateral primary osteoarthritis, right hip 03/08/2017  . Bilateral hip pain 10/15/2016  . Abnormality of gait 10/15/2016    Latarshia Jersey W. 08/18/2019, 9:40 AM  Frazier Butt., PT  [facility] [address] [place], [name], [address] Phone: [phone]   Fax:  [phone]  Name: Kristos Maines MRN: [id number] Date of Birth: 05/13/1951

## 2019-08-18 NOTE — Therapy (Signed)
[place] [address] [place], [name], [address] Phone: [phone]   Fax:  [phone]  Occupational Therapy Treatment  Patient Details  Name: Franisco Fedorov MRN: [id number] Date of Birth: 1951-05-10 Referring Provider (OT): Dr. Letta Pate   Encounter Date: 08/18/2019  OT End of Session - 08/18/19 0859    Visit Number  8    Number of Visits  17    Date for OT Re-Evaluation  08/27/19    Authorization Type  [place] - Visit Number  8    Authorization - Number of Visits  15    OT Start Time  0848    OT Stop Time  0926    OT Time Calculation (min)  38 min    Activity Tolerance  Patient tolerated treatment well    Behavior During Therapy  [facility] for tasks assessed/performed       Past Medical History:  Diagnosis Date  . Abnormality of gait 10/15/2016  . Arthritis   . Dysrhythmia    hx of  . Essential hypertension 03/14/2017  . PVCs (premature ventricular contractions) 03/14/2017  . Skin cancer    Skin  . Stroke [facility])    Mini strokes . 06/25/2019  Expressive aphasia - improving  . Wears glasses     Past Surgical History:  Procedure Laterality Date  . COLONOSCOPY    . IR ANGIO INTRA EXTRACRAN SEL COM CAROTID INNOMINATE BILAT MOD SED  05/07/2019  . IR ANGIO INTRA EXTRACRAN SEL COM CAROTID INNOMINATE BILAT MOD SED  07/20/2019  . IR ANGIO VERTEBRAL SEL SUBCLAVIAN INNOMINATE BILAT MOD SED  05/07/2019  . IR ANGIO VERTEBRAL SEL SUBCLAVIAN INNOMINATE UNI R MOD SED  07/20/2019  . IR PATIENT EVAL TECH 0-60 MINS  06/08/2019  . IR TRANSCATH EXCRAN VERT OR CAR A STENT  05/12/2019  . IR TRANSCATH EXCRAN VERT OR CAR A STENT  07/20/2019  . IR US GUIDE VASC ACCESS RIGHT  05/12/2019  . RADIOLOGY WITH ANESTHESIA N/A 05/08/2019   Procedure: RADIOLOGY WITH ANESTHESIA;  Surgeon: [name], MD;  Location: [place];  Service: Radiology;  Laterality: N/A;  . RADIOLOGY WITH ANESTHESIA N/A 05/12/2019   Procedure: carotid  stenting;  Surgeon: [name], MD;  Location: [place];  Service: Radiology;  Laterality: N/A;  . RADIOLOGY WITH ANESTHESIA N/A 06/08/2019   Procedure: STENT PLACEMENT;  Surgeon: [name], MD;  Location: [place];  Service: Radiology;  Laterality: N/A;  . RADIOLOGY WITH ANESTHESIA N/A 07/20/2019   Procedure: RADIOLOGY WITH ANESTHESIA STENTING;  Surgeon: [name], MD;  Location: [place];  Service: Radiology;  Laterality: N/A;  . SKIN CANCER EXCISION    . TOTAL HIP ARTHROPLASTY Right 03/22/2017   Procedure: RIGHT TOTAL HIP ARTHROPLASTY ANTERIOR APPROACH;  Surgeon: Mcarthur Rossetti, MD;  [facility];  Service: Orthopedics;  Laterality: Right;  . TOTAL HIP ARTHROPLASTY Left 08/23/2017   Procedure: LEFT TOTAL HIP ARTHROPLASTY ANTERIOR APPROACH;  Surgeon: Mcarthur Rossetti, MD;  [facility];  Service: Orthopedics;  Laterality: Left;  . WISDOM TOOTH EXTRACTION      There were no vitals filed for this visit.  Subjective Assessment - 08/18/19 0900    Patient is accompanied by:  Family member    Pertinent History  05/05/19: acute Lt frontal and parietal watershed infarcts. Pt also had old corticol and subcortical infarcts in both frontal and parietal regions, Bil THA 2018, HTN    Limitations  No driving, not currently working,  since surgery - no lifting > 10 lbs, no bending or stooping    Currently in Pain?  No/denies              Treatment: Tabletop scanning to match clock faces with correct time, increased time, only 1 v.c. Environmental scanning 70% accuracy first pass, then pt located 1 remaining item on second pass, then 2 items last pass. Checked progress towards short term goals. Typing activities to copy phone numbers from a list by typing to simulate work activities, min v.c and increased time required               OT Short Term Goals - 08/18/19 0909      OT SHORT TERM GOAL #1   Title  Independent with HEP for coordination -  06/26/19    Time  4    Period  Weeks    Status  Achieved      OT SHORT TERM GOAL #2   Title  Improve coordination bilaterally by 5 sec. or more    Baseline  Rt = 38.47 sec, Lt = 33.93 sec    Time  4    Period  Weeks    Status  On-going   08/18/2019-RUE 34.19 LUE 29.94- improved but not fully achieved     OT SHORT TERM GOAL #3   Title  Pt to perform tabletop visual scanning to far Rt and Lt side with no more than min questioning cues    Time  4    Period  Weeks    Status  Achieved      OT SHORT TERM GOAL #4   Title  Pt to perform environmental scanning at 75% accuracy in prep for potential driving    Time  4    Period  Weeks    Status  On-going   70%       OT Long Term Goals - 07/27/19 [id number]      OT LONG TERM GOAL #1   Title  Pt to perform simple stovetop meal safely with distant supervision - 07/27/19    Time  8    Period  Weeks    Status  New      OT LONG TERM GOAL #2   Title  Pt to perform environmental scanning at 90% accuracy while performing simple physical task for multi-tasking and in prep for driving    Time  8    Period  Weeks    Status  New      OT LONG TERM GOAL #3   Title  Pt to assemble pipe design in prep for functional assembly projects at home w/o cues    Time  8    Period  Weeks    Status  Achieved      OT LONG TERM GOAL #4   Title  Pt to perform computer tasks including searches at mod I level    Time  8    Period  Weeks    Status  New      OT LONG TERM GOAL #5   Title  Pt to type 10 wpm at 80% or greater accuracy (numbers when called out for work simulated tasks)    Time  8    Period  Weeks    Status  New              Patient will benefit from skilled therapeutic intervention in order to improve the following deficits and impairments:  Visit Diagnosis: Other symptoms and signs involving cognitive functions following cerebral infarction  Visuospatial deficit  Muscle weakness (generalized)  Other lack of  coordination  Attention and concentration deficit    Problem List Patient Active Problem List   Diagnosis Date Noted  . Vertebral artery stenosis 07/21/2019  . Vertebral artery narrowing, left 07/20/2019  . Non-fluent aphasia   . Acute bilateral cerebral infarction in a watershed distribution [facility]) 05/14/2019  . Vertebral artery narrowing, right 05/12/2019  . Hyperlipidemia 05/06/2019  . CVA (cerebral vascular accident) ([place]) 05/05/2019  . Internal carotid artery occlusion, bilateral   . Covid-19 Virus not Detected   . Status post total replacement of left hip 08/23/2017  . Unilateral primary osteoarthritis, left hip 05/02/2017  . Status post total replacement of right hip 03/22/2017  . PVCs (premature ventricular contractions) 03/14/2017  . Essential hypertension 03/14/2017  . Unilateral primary osteoarthritis, right hip 03/08/2017  . Bilateral hip pain 10/15/2016  . Abnormality of gait 10/15/2016    RINE,KATHRYN 08/18/2019, 9:23 AM  [facility] [address] [place], [name], [address] Phone: [phone]   Fax:  [phone]  Name: Maximous Suire MRN: [id number] Date of Birth: Apr 28, 1951

## 2019-08-21 ENCOUNTER — Other Ambulatory Visit: Payer: Self-pay

## 2019-08-21 ENCOUNTER — Ambulatory Visit: Payer: Commercial Managed Care - PPO | Admitting: Physical Therapy

## 2019-08-21 ENCOUNTER — Encounter: Payer: Self-pay | Admitting: Physical Therapy

## 2019-08-21 DIAGNOSIS — R2681 Unsteadiness on feet: Secondary | ICD-10-CM

## 2019-08-21 DIAGNOSIS — R269 Unspecified abnormalities of gait and mobility: Secondary | ICD-10-CM

## 2019-08-21 NOTE — Therapy (Addendum)
[facility] [address] [place], [name], [address] Phone: [phone]   Fax:  [phone]  Physical Therapy Treatment  Patient Details  Name: Jose Kaufman MRN: [id number] Date of Birth: 05-10-1951 Referring Provider (PT): Leonie Man   Encounter Date: 08/21/2019  CLINIC OPERATION CHANGES: Outpatient [facility] is open at lower capacity following universal masking, social distancing, and patient screening.  The patient's COVID risk of complications score is 3.   PT End of Session - 08/21/19 1050    Visit Number  10    Number of Visits  10   recert 04/15/176   Date for PT Re-Evaluation  09/12/19    Authorization Type  UME; 30 VL (PT/OT/aquatic counts as one visit on the same day HARD MAX); speech is separate 30 VL    Authorization - Visit Number  10   In chart:  13 PT/OT visits counted, combined   Authorization - Number of Visits  15    PT Start Time  0935    PT Stop Time  1013    PT Time Calculation (min)  38 min    Activity Tolerance  Patient tolerated treatment well    Behavior During Therapy  WFL for tasks assessed/performed       Past Medical History:  Diagnosis Date  . Abnormality of gait 10/15/2016  . Arthritis   . Dysrhythmia    hx of  . Essential hypertension 03/14/2017  . PVCs (premature ventricular contractions) 03/14/2017  . Skin cancer    Skin  . Stroke [facility])    Mini strokes . 06/25/2019  Expressive aphasia - improving  . Wears glasses     Past Surgical History:  Procedure Laterality Date  . COLONOSCOPY    . IR ANGIO INTRA EXTRACRAN SEL COM CAROTID INNOMINATE BILAT MOD SED  05/07/2019  . IR ANGIO INTRA EXTRACRAN SEL COM CAROTID INNOMINATE BILAT MOD SED  07/20/2019  . IR ANGIO VERTEBRAL SEL SUBCLAVIAN INNOMINATE BILAT MOD SED  05/07/2019  . IR ANGIO VERTEBRAL SEL SUBCLAVIAN INNOMINATE UNI R MOD SED  07/20/2019  . IR PATIENT EVAL TECH 0-60 MINS  06/08/2019  . IR TRANSCATH EXCRAN VERT OR CAR A STENT   05/12/2019  . IR TRANSCATH EXCRAN VERT OR CAR A STENT  07/20/2019  . IR US GUIDE VASC ACCESS RIGHT  05/12/2019  . RADIOLOGY WITH ANESTHESIA N/A 05/08/2019   Procedure: RADIOLOGY WITH ANESTHESIA;  Surgeon: [name], MD;  Location: [place];  Service: Radiology;  Laterality: N/A;  . RADIOLOGY WITH ANESTHESIA N/A 05/12/2019   Procedure: carotid stenting;  Surgeon: [name], MD;  Location: [place];  Service: Radiology;  Laterality: N/A;  . RADIOLOGY WITH ANESTHESIA N/A 06/08/2019   Procedure: STENT PLACEMENT;  Surgeon: [name], MD;  Location: [place];  Service: Radiology;  Laterality: N/A;  . RADIOLOGY WITH ANESTHESIA N/A 07/20/2019   Procedure: RADIOLOGY WITH ANESTHESIA STENTING;  Surgeon: [name], MD;  Location: [place];  Service: Radiology;  Laterality: N/A;  . SKIN CANCER EXCISION    . TOTAL HIP ARTHROPLASTY Right 03/22/2017   Procedure: RIGHT TOTAL HIP ARTHROPLASTY ANTERIOR APPROACH;  Surgeon: Mcarthur Rossetti, MD;  Location: [facility];  Service: Orthopedics;  Laterality: Right;  . TOTAL HIP ARTHROPLASTY Left 08/23/2017   Procedure: LEFT TOTAL HIP ARTHROPLASTY ANTERIOR APPROACH;  Surgeon: Mcarthur Rossetti, MD;  Location: [facility];  Service: Orthopedics;  Laterality: Left;  . WISDOM TOOTH EXTRACTION      There were no vitals filed for this  visit.  Subjective Assessment - 08/21/19 0936    Subjective  No changes, no falls.    Pertinent History  Hx of bilateral hip replacement 2018, hx of OA    Patient Stated Goals  Pt's goal for therapy is to walk better.    Currently in Pain?  No/denies                       [facility] Adult PT Treatment/Exercise - 08/21/19 0001      Transfers   Transfers  Sit to Stand;Stand to Sit    Sit to Stand  5: Supervision;Without upper extremity assist;From chair/3-in-1    Five time sit to stand comments   11.35    Stand to Sit  5: Supervision;Without upper extremity assist;To chair/3-in-1      Ambulation/Gait    Ambulation/Gait  Yes    Ambulation/Gait Assistance  6: Modified independent (Device/Increase time)    Ambulation Distance (Feet)  600 Feet   outdoors; then 200 ft indoors   Assistive device  None    Gait Pattern  Step-through pattern;Decreased arm swing - right;Decreased arm swing - left;Shuffle;Festinating   festination with initiation of gait, with turns   Ambulation Surface  Level;Indoor      Dynamic Gait Index   Level Surface  Mild Impairment    Change in Gait Speed  Moderate Impairment    Gait with Horizontal Head Turns  Mild Impairment    Gait with Vertical Head Turns  Mild Impairment    Gait and Pivot Turn  Normal    Step Over Obstacle  Moderate Impairment    Step Around Obstacles  Normal    Steps  Mild Impairment    Total Score  16      Timed Up and Go Test   TUG  Normal TUG    Normal TUG (seconds)  13.78   13.87     Self-Care   Self-Care  Other Self-Care Comments    Other Self-Care Comments   Discussed progress towards goals, plans for d/c this visit, importance of conitnued HEP performance at home even with PT d/c.            Reviewed/pt performed full HEP: forward step ups, lateral step ups, forward step downs, sit<>stand; tandem gait, forward/back walking, sidestepping at counter (with cues for large steps)      PT Long Term Goals - 08/21/19 0937      PT LONG TERM GOAL #1   Title  Pt will be independent with HEP for improved balance, strength, and gait.  TARGET 08/21/2019    Time  1    Period  Weeks    Status  Achieved      PT LONG TERM GOAL #2   Title  Pt will improve 5x sit<>stand to less than or equal to 11.5 sec for improved functional strength.    Baseline  11.35 sec    Time  1    Period  Weeks    Status  Achieved      PT LONG TERM GOAL #3   Title  Pt will improve TUG score to less than or equal to 13.5 seconds for decreased fall risk.    Baseline  13.78 sec    Time  1    Period  Weeks    Status  Not Met      PT LONG TERM GOAL #4    Title  Pt will improve DGI score to at least 16/24 for  decreased fall risk.    Baseline  16/24    Time  4    Period  Weeks    Status  Achieved      PT LONG TERM GOAL #5   Title  Pt will verbalize understanding of fall prevention in home environment.    Time  4    Period  Weeks    Status  Achieved            Plan - 08/21/19 1055    Clinical Impression Statement  Assessed LTGs this visit, with pt meeting 4 of 5 goals.  Pt has made steady improvements with therapy and balance; he reports continueing to do his HEP at home.  He is appropriate for d/c at this time.    Personal Factors and Comorbidities  Comorbidity 2    Comorbidities  Hx of OA, hx of bilateral hip replacements    Examination-Activity Limitations  Locomotion Level;Stairs    Examination-Participation Restrictions  Community Activity    Stability/Clinical Decision Making  Stable/Uncomplicated    Rehab Potential  Good    PT Frequency  2x / week    PT Duration  Other (comment)   1 week, per recert 12/18/4172   PT Treatment/Interventions  ADLs/Self Care Home Management;Gait training;Stair training;Functional mobility training;Therapeutic activities;Therapeutic exercise;Balance training;Neuromuscular re-education;Patient/family education    PT Next Visit Plan  D/C this visit.    PT Home Exercise Plan  Access Code: [email or web address]    Consulted and Agree with Plan of Care  Patient       Patient will benefit from skilled therapeutic intervention in order to improve the following deficits and impairments:  Abnormal gait, Difficulty walking, Decreased balance, Decreased mobility, Decreased strength  Visit Diagnosis: Abnormality of gait  Unsteadiness on feet     Problem List Patient Active Problem List   Diagnosis Date Noted  . Vertebral artery stenosis 07/21/2019  . Vertebral artery narrowing, left 07/20/2019  . Non-fluent aphasia   . Acute bilateral cerebral infarction in a watershed distribution [facility]) 05/14/2019  .  Vertebral artery narrowing, right 05/12/2019  . Hyperlipidemia 05/06/2019  . CVA (cerebral vascular accident) ([place]) 05/05/2019  . Internal carotid artery occlusion, bilateral   . Covid-19 Virus not Detected   . Status post total replacement of left hip 08/23/2017  . Unilateral primary osteoarthritis, left hip 05/02/2017  . Status post total replacement of right hip 03/22/2017  . PVCs (premature ventricular contractions) 03/14/2017  . Essential hypertension 03/14/2017  . Unilateral primary osteoarthritis, right hip 03/08/2017  . Bilateral hip pain 10/15/2016  . Abnormality of gait 10/15/2016    MARRIOTT,AMY W. 08/21/2019, 11:50 AM  Frazier Butt., PT   [facility] [address] [place], [name], [address] Phone: [phone]   Fax:  [phone]  Name: Jose Kaufman MRN: [id number] Date of Birth: 01-04-1951   PHYSICAL THERAPY DISCHARGE SUMMARY  Visits from Start of Care: 10  Current functional level related to goals / functional outcomes: PT Long Term Goals - 08/21/19 0937      PT LONG TERM GOAL #1   Title  Pt will be independent with HEP for improved balance, strength, and gait.  TARGET 08/21/2019    Time  1    Period  Weeks    Status  Achieved      PT LONG TERM GOAL #2   Title  Pt will improve 5x sit<>stand to less than or equal to 11.5 sec for improved functional strength.    Baseline  11.35 sec    Time  1    Period  Weeks    Status  Achieved      PT LONG TERM GOAL #3   Title  Pt will improve TUG score to less than or equal to 13.5 seconds for decreased fall risk.    Baseline  13.78 sec    Time  1    Period  Weeks    Status  Not Met      PT LONG TERM GOAL #4   Title  Pt will improve DGI score to at least 16/24 for decreased fall risk.    Baseline  16/24    Time  4    Period  Weeks    Status  Achieved      PT LONG TERM GOAL #5   Title  Pt will verbalize understanding of fall prevention in home  environment.    Time  4    Period  Weeks    Status  Achieved      Pt has met 4 of 5 LTGs.   Remaining deficits: Pt demonstrates hesitation with initiation of gait and turns, but he reports (and brother endorses) that this pattern was present prior to CVA and that pt is close to his prior baseline.   Education / Equipment: HEP, fall prevention  Plan: Patient agrees to discharge.  Patient goals were partially met. Patient is being discharged due to being pleased with the current functional level.  ?????   Mady Haagensen, PT 08/21/19 11:55 AM Phone: [phone] Fax: [phone]

## 2019-08-26 ENCOUNTER — Ambulatory Visit: Payer: Commercial Managed Care - PPO | Admitting: Occupational Therapy

## 2019-08-26 ENCOUNTER — Other Ambulatory Visit: Payer: Self-pay

## 2019-08-26 ENCOUNTER — Encounter: Payer: Self-pay | Admitting: Occupational Therapy

## 2019-08-26 DIAGNOSIS — R278 Other lack of coordination: Secondary | ICD-10-CM

## 2019-08-26 DIAGNOSIS — R2689 Other abnormalities of gait and mobility: Secondary | ICD-10-CM

## 2019-08-26 DIAGNOSIS — R41840 Attention and concentration deficit: Secondary | ICD-10-CM

## 2019-08-26 DIAGNOSIS — R41842 Visuospatial deficit: Secondary | ICD-10-CM

## 2019-08-26 DIAGNOSIS — R2681 Unsteadiness on feet: Secondary | ICD-10-CM

## 2019-08-26 DIAGNOSIS — I69318 Other symptoms and signs involving cognitive functions following cerebral infarction: Secondary | ICD-10-CM

## 2019-08-26 NOTE — Therapy (Signed)
[facility] [address] [place], [name], [address] Phone: [phone]   Fax:  [phone]  Occupational Therapy Treatment  Patient Details  Name: Jose Kaufman MRN: [id number] Date of Birth: 1951-06-17 Referring Provider (OT): Dr. Letta Pate   Encounter Date: 08/26/2019  OT End of Session - 08/26/19 0855    Visit Number  9    Number of Visits  17    Date for OT Re-Evaluation  08/27/19    Authorization Type  [employer] UMR    Authorization - Visit Number  9    Authorization - Number of Visits  15    OT Start Time  0853    OT Stop Time  0931    OT Time Calculation (min)  38 min    Activity Tolerance  Patient tolerated treatment well    Behavior During Therapy  [facility] for tasks assessed/performed       Past Medical History:  Diagnosis Date  . Abnormality of gait 10/15/2016  . Arthritis   . Dysrhythmia    hx of  . Essential hypertension 03/14/2017  . PVCs (premature ventricular contractions) 03/14/2017  . Skin cancer    Skin  . Stroke [facility])    Mini strokes . 06/25/2019  Expressive aphasia - improving  . Wears glasses     Past Surgical History:  Procedure Laterality Date  . COLONOSCOPY    . IR ANGIO INTRA EXTRACRAN SEL COM CAROTID INNOMINATE BILAT MOD SED  05/07/2019  . IR ANGIO INTRA EXTRACRAN SEL COM CAROTID INNOMINATE BILAT MOD SED  07/20/2019  . IR ANGIO VERTEBRAL SEL SUBCLAVIAN INNOMINATE BILAT MOD SED  05/07/2019  . IR ANGIO VERTEBRAL SEL SUBCLAVIAN INNOMINATE UNI R MOD SED  07/20/2019  . IR PATIENT EVAL TECH 0-60 MINS  06/08/2019  . IR TRANSCATH EXCRAN VERT OR CAR A STENT  05/12/2019  . IR TRANSCATH EXCRAN VERT OR CAR A STENT  07/20/2019  . IR US GUIDE VASC ACCESS RIGHT  05/12/2019  . RADIOLOGY WITH ANESTHESIA N/A 05/08/2019   Procedure: RADIOLOGY WITH ANESTHESIA;  Surgeon: [name], MD;  Location: [place];  Service: Radiology;  Laterality: N/A;  . RADIOLOGY WITH ANESTHESIA N/A 05/12/2019   Procedure: carotid  stenting;  Surgeon: [name], MD;  Location: [place];  Service: Radiology;  Laterality: N/A;  . RADIOLOGY WITH ANESTHESIA N/A 06/08/2019   Procedure: STENT PLACEMENT;  Surgeon: [name], MD;  Location: [place];  Service: Radiology;  Laterality: N/A;  . RADIOLOGY WITH ANESTHESIA N/A 07/20/2019   Procedure: RADIOLOGY WITH ANESTHESIA STENTING;  Surgeon: [name], MD;  Location: [place];  Service: Radiology;  Laterality: N/A;  . SKIN CANCER EXCISION    . TOTAL HIP ARTHROPLASTY Right 03/22/2017   Procedure: RIGHT TOTAL HIP ARTHROPLASTY ANTERIOR APPROACH;  Surgeon: Mcarthur Rossetti, MD;  [facility];  Service: Orthopedics;  Laterality: Right;  . TOTAL HIP ARTHROPLASTY Left 08/23/2017   Procedure: LEFT TOTAL HIP ARTHROPLASTY ANTERIOR APPROACH;  Surgeon: Mcarthur Rossetti, MD;  [facility];  Service: Orthopedics;  Laterality: Left;  . WISDOM TOOTH EXTRACTION      There were no vitals filed for this visit.  Subjective Assessment - 08/26/19 0854    Subjective   Pt reports that he feels like he is doing well    Patient is accompanied by:  Family member    Pertinent History  05/05/19: acute Lt frontal and parietal watershed infarcts. Pt also had old corticol and subcortical infarcts in both frontal and  parietal regions, Bil THA 2018, HTN    Limitations  No driving, not currently working,  since surgery - no lifting > 10 lbs, no bending or stooping    Currently in Pain?  No/denies         Environmental Scanning in mod busy environment with approx 70% accuracy initially with min-mod cues for remaining.   Typing test (numbers only) with 81, 91% accuracy and 5wpm.  Then typed 8-digit numbers (to simulate work tasks) given verbally with 95+% accuracy (self-corrected all errors) and improved speed noted.  Performed simple intranet searches to simulate work tasks with incr time/min difficulty.    Ambulating while tossing scarf between hands and performing simple  cognitive tasks (category generation, counting backwards, backwards naming), pt with min-mod difficulty with pauses while thinking, incr difficulty with initiating walking, and difficulty with cognitive task).          OT Education - 08/26/19 2313    Education Details  Discussed difficulty during session to incr awareness (environmental scanning and divided attention) and activities to improve this includeing looking for things in grocery store, head turns when walking across parking lot with supervision, and "scavenger hunt" type activities in community with supervision (from brother) or when riding as passenger    Person(s) Educated  Patient;Caregiver(s)   brother   Methods  Explanation    Comprehension  Verbalized understanding       OT Short Term Goals - 08/18/19 0909      OT SHORT TERM GOAL #1   Title  Independent with HEP for coordination - 06/26/19    Time  4    Period  Weeks    Status  Achieved      OT SHORT TERM GOAL #2   Title  Improve coordination bilaterally by 5 sec. or more    Baseline  Rt = 38.47 sec, Lt = 33.93 sec    Time  4    Period  Weeks    Status  On-going   08/18/2019-RUE 34.19 LUE 29.94- improved but not fully achieved     OT SHORT TERM GOAL #3   Title  Pt to perform tabletop visual scanning to far Rt and Lt side with no more than min questioning cues    Time  4    Period  Weeks    Status  Achieved      OT SHORT TERM GOAL #4   Title  Pt to perform environmental scanning at 75% accuracy in prep for potential driving    Time  4    Period  Weeks    Status  On-going   70%       OT Long Term Goals - 08/26/19 2322      OT LONG TERM GOAL #1   Title  Pt to perform simple stovetop meal safely with distant supervision - 07/27/19    Time  8    Period  Weeks    Status  New      OT LONG TERM GOAL #2   Title  Pt to perform environmental scanning at 90% accuracy while performing simple physical task for multi-tasking and in prep for driving    Time  8     Period  Weeks    Status  New      OT LONG TERM GOAL #3   Title  Pt to assemble pipe design in prep for functional assembly projects at home w/o cues    Time  8    Period  Weeks    Status  Achieved      OT LONG TERM GOAL #4   Title  Pt to perform computer tasks including searches at mod I level    Time  8    Period  Weeks    Status  New      OT LONG TERM GOAL #5   Title  Pt to type 10 wpm at 80% or greater accuracy (numbers when called out for work simulated tasks)    Time  8    Period  Weeks    Status  On-going   08/26/19:  81-91% accuracy with 5wpm copying numbers, but 95+% accuracy with numbers given verbally (and incr speed noted)           Plan - 08/26/19 2319    Clinical Impression Statement  Pt with improved typing accuracy with typing in numbers read verbally and with simple searches.  Pt continues to demo difficulty with turning/initiating when walking, environmental scanning, and divided attention.    Occupational performance deficits (Please refer to evaluation for details):  ADL's;IADL's;Work;Social Participation    Body Structure / Function / Physical Skills  IADL;Sensation;Strength;UE functional use;FMC;Coordination;GMC;Balance    Cognitive Skills  Attention;Safety Awareness;Sequencing;Understand;Memory;Perception;Problem Solve    Rehab Potential  Fair    OT Frequency  2x / week    OT Duration  8 weeks    OT Treatment/Interventions  Self-care/ADL training;Therapeutic exercise;Functional Mobility Training;Neuromuscular education;Manual Therapy;Therapeutic activities;DME and/or AE instruction;Cognitive remediation/compensation;Visual/perceptual remediation/compensation;Patient/family education;Passive range of motion    Plan  continue environmental scanning, cooking task, begin checking remaining goals    Consulted and Agree with Plan of Care  Patient;Family member/caregiver    Family Member Consulted  brother       Patient will benefit from skilled  therapeutic intervention in order to improve the following deficits and impairments:   Body Structure / Function / Physical Skills: IADL, Sensation, Strength, UE functional use, FMC, Coordination, GMC, Balance Cognitive Skills: Attention, Safety Awareness, Sequencing, Understand, Memory, Perception, Problem Solve     Visit Diagnosis: Other symptoms and signs involving cognitive functions following cerebral infarction  Visuospatial deficit  Attention and concentration deficit  Other lack of coordination  Other abnormalities of gait and mobility  Unsteadiness on feet    Problem List Patient Active Problem List   Diagnosis Date Noted  . Vertebral artery stenosis 07/21/2019  . Vertebral artery narrowing, left 07/20/2019  . Non-fluent aphasia   . Acute bilateral cerebral infarction in a watershed distribution [facility]) 05/14/2019  . Vertebral artery narrowing, right 05/12/2019  . Hyperlipidemia 05/06/2019  . CVA (cerebral vascular accident) ([place]) 05/05/2019  . Internal carotid artery occlusion, bilateral   . Covid-19 Virus not Detected   . Status post total replacement of left hip 08/23/2017  . Unilateral primary osteoarthritis, left hip 05/02/2017  . Status post total replacement of right hip 03/22/2017  . PVCs (premature ventricular contractions) 03/14/2017  . Essential hypertension 03/14/2017  . Unilateral primary osteoarthritis, right hip 03/08/2017  . Bilateral hip pain 10/15/2016  . Abnormality of gait 10/15/2016    [facility] 08/26/2019, 11:43 PM  [place] [address] [place], [name], [address] Phone: [phone]   Fax:  [phone]  Name: Dallen Kollars MRN: [id number] Date of Birth: 02-10-1951   Vianne Bulls, OTR/L [facility] [address] [place], [place]  [address] [phone] phone [phone] 08/26/19 11:43 PM

## 2019-08-28 ENCOUNTER — Encounter: Payer: Commercial Managed Care - PPO | Admitting: Physical Medicine & Rehabilitation

## 2019-09-01 ENCOUNTER — Other Ambulatory Visit: Payer: Self-pay

## 2019-09-01 ENCOUNTER — Ambulatory Visit: Payer: Commercial Managed Care - PPO | Admitting: Occupational Therapy

## 2019-09-01 DIAGNOSIS — R2681 Unsteadiness on feet: Secondary | ICD-10-CM | POA: Diagnosis not present

## 2019-09-01 DIAGNOSIS — I69318 Other symptoms and signs involving cognitive functions following cerebral infarction: Secondary | ICD-10-CM

## 2019-09-01 DIAGNOSIS — R41840 Attention and concentration deficit: Secondary | ICD-10-CM

## 2019-09-01 NOTE — Therapy (Signed)
[facility] [address] [place], [name], [address] Phone: [phone]   Fax:  [phone]  Occupational Therapy Treatment  Patient Details  Name: Jose Kaufman MRN: [id number] Date of Birth: [birth year] Referring Provider (OT): Dr. Letta Pate   Encounter Date: 09/01/2019  OT End of Session - 09/01/19 1022    Visit Number  10    Number of Visits  17    Date for OT Re-Evaluation  08/27/19    Authorization Type  [place] - Visit Number  10    Authorization - Number of Visits  15    OT Start Time  0930    OT Stop Time  1015    OT Time Calculation (min)  45 min    Activity Tolerance  Patient tolerated treatment well    Behavior During Therapy  [facility] for tasks assessed/performed       Past Medical History:  Diagnosis Date  . Abnormality of gait 10/15/2016  . Arthritis   . Dysrhythmia    hx of  . Essential hypertension 03/14/2017  . PVCs (premature ventricular contractions) 03/14/2017  . Skin cancer    Skin  . Stroke [facility])    Mini strokes . 06/25/2019  Expressive aphasia - improving  . Wears glasses     Past Surgical History:  Procedure Laterality Date  . COLONOSCOPY    . IR ANGIO INTRA EXTRACRAN SEL COM CAROTID INNOMINATE BILAT MOD SED  05/07/2019  . IR ANGIO INTRA EXTRACRAN SEL COM CAROTID INNOMINATE BILAT MOD SED  07/20/2019  . IR ANGIO VERTEBRAL SEL SUBCLAVIAN INNOMINATE BILAT MOD SED  05/07/2019  . IR ANGIO VERTEBRAL SEL SUBCLAVIAN INNOMINATE UNI R MOD SED  07/20/2019  . IR PATIENT EVAL TECH 0-60 MINS  06/08/2019  . IR TRANSCATH EXCRAN VERT OR CAR A STENT  05/12/2019  . IR TRANSCATH EXCRAN VERT OR CAR A STENT  07/20/2019  . IR US GUIDE VASC ACCESS RIGHT  05/12/2019  . RADIOLOGY WITH ANESTHESIA N/A 05/08/2019   Procedure: RADIOLOGY WITH ANESTHESIA;  Surgeon: [name], MD;  Location: [facility];  Service: Radiology;  Laterality: N/A;  . RADIOLOGY WITH ANESTHESIA N/A 05/12/2019   Procedure: carotid  stenting;  Surgeon: [name], MD;  Location: [facility];  Service: Radiology;  Laterality: N/A;  . RADIOLOGY WITH ANESTHESIA N/A 06/08/2019   Procedure: STENT PLACEMENT;  Surgeon: [name], MD;  Location: [facility];  Service: Radiology;  Laterality: N/A;  . RADIOLOGY WITH ANESTHESIA N/A 07/20/2019   Procedure: RADIOLOGY WITH ANESTHESIA STENTING;  Surgeon: [name], MD;  Location: [facility];  Service: Radiology;  Laterality: N/A;  . SKIN CANCER EXCISION    . TOTAL HIP ARTHROPLASTY Right 03/22/2017   Procedure: RIGHT TOTAL HIP ARTHROPLASTY ANTERIOR APPROACH;  Surgeon: Mcarthur Rossetti, MD;  Location: [facility];  Service: Orthopedics;  Laterality: Right;  . TOTAL HIP ARTHROPLASTY Left 08/23/2017   Procedure: LEFT TOTAL HIP ARTHROPLASTY ANTERIOR APPROACH;  Surgeon: Mcarthur Rossetti, MD;  Location: [facility];  Service: Orthopedics;  Laterality: Left;  . WISDOM TOOTH EXTRACTION      There were no vitals filed for this visit.  Subjective Assessment - 09/01/19 1021    Subjective   I feel like I can drive and go back to work    Pertinent History  05/05/19: acute Lt frontal and parietal watershed infarcts. Pt also had old corticol and subcortical infarcts in both frontal and parietal regions, Bil THA 2018, HTN  Limitations  No driving, not currently working,  since surgery - no lifting > 10 lbs, no bending or stooping    Currently in Pain?  No/denies       Pt was asked to cook grilled cheese and fried egg for divided attention - pt demo decreased ability to sequence, gather necessary ingredients, functionally problem solve, and perform divided attention. Pt required mod cueing t/o task including adjusting stove temperature. Pt did I'ly turn off stove w/o cues.  Pt performing environmental scanning while tossing scarf finding 11/13 items (85% accuracy) however had to stop several times and had difficulty multi-tasking. Pt was 70% accuracy last session. Pt found remaining 2 items on  2nd pass                      OT Short Term Goals - 09/01/19 1024      OT SHORT TERM GOAL #1   Title  Independent with HEP for coordination - 06/26/19    Time  4    Period  Weeks    Status  Achieved      OT SHORT TERM GOAL #2   Title  Improve coordination bilaterally by 5 sec. or more    Baseline  Rt = 38.47 sec, Lt = 33.93 sec    Time  4    Period  Weeks    Status  On-going   08/18/2019-RUE 34.19 LUE 29.94- improved but not fully achieved     OT SHORT TERM GOAL #3   Title  Pt to perform tabletop visual scanning to far Rt and Lt side with no more than min questioning cues    Time  4    Period  Weeks    Status  Achieved      OT SHORT TERM GOAL #4   Title  Pt to perform environmental scanning at 75% accuracy in prep for potential driving    Time  4    Period  Weeks    Status  Partially Met   inconsistently b/t 70-85%       OT Long Term Goals - 09/01/19 1025      OT LONG TERM GOAL #1   Title  Pt to perform simple stovetop meal safely with distant supervision - 07/27/19    Time  8    Period  Weeks    Status  Not Met   Pt needs direct supervision and cues to perform safely     OT LONG TERM GOAL #2   Title  Pt to perform environmental scanning at 90% accuracy while performing simple physical task for multi-tasking and in prep for driving    Time  8    Period  Weeks    Status  Not Met   70-85% (however mostly closer to 70%)     OT LONG TERM GOAL #3   Title  Pt to assemble pipe design in prep for functional assembly projects at home w/o cues    Time  8    Period  Weeks    Status  Achieved      OT LONG TERM GOAL #4   Title  Pt to perform computer tasks including searches at mod I level    Time  8    Period  Weeks    Status  Partially Met   simple searches only     OT LONG TERM GOAL #5   Title  Pt to type 10 wpm at 80% or greater accuracy (numbers  when called out for work simulated tasks)    Time  8    Period  Weeks    Status  On-going    08/26/19:  81-91% accuracy with 5wpm copying numbers, but 95+% accuracy with numbers given verbally (and incr speed noted)           Plan - 09/01/19 1026    Clinical Impression Statement  Pt continues to show deficits in processing speed, problem solving, and sequencing in functional tasks. Pt/brother appear in denial of deficits. (Pt also has shuffling gait w/ initiation of movement, when turning, in doorways, etc. but he contributes this to OA in hip)    Occupational performance deficits (Please refer to evaluation for details):  ADL's;IADL's;Work;Social Participation    Body Structure / Function / Physical Skills  IADL;Sensation;Strength;UE functional use;FMC;Coordination;GMC;Balance    Cognitive Skills  Attention;Safety Awareness;Sequencing;Understand;Memory;Perception;Problem Solve    Rehab Potential  Fair    OT Frequency  2x / week    OT Duration  8 weeks    OT Treatment/Interventions  Self-care/ADL training;Therapeutic exercise;Functional Mobility Training;Neuromuscular education;Manual Therapy;Therapeutic activities;DME and/or AE instruction;Cognitive remediation/compensation;Visual/perceptual remediation/compensation;Patient/family education;Passive range of motion    Plan  check remaining goals and d/c per pt request.    Consulted and Agree with Plan of Care  Patient;Family member/caregiver    Family Member Consulted  brother       Patient will benefit from skilled therapeutic intervention in order to improve the following deficits and impairments:   Body Structure / Function / Physical Skills: IADL, Sensation, Strength, UE functional use, FMC, Coordination, GMC, Balance Cognitive Skills: Attention, Safety Awareness, Sequencing, Understand, Memory, Perception, Problem Solve     Visit Diagnosis: Other symptoms and signs involving cognitive functions following cerebral infarction  Attention and concentration deficit  Unsteadiness on feet    Problem List Patient Active  Problem List   Diagnosis Date Noted  . Vertebral artery stenosis 07/21/2019  . Vertebral artery narrowing, left 07/20/2019  . Non-fluent aphasia   . Acute bilateral cerebral infarction in a watershed distribution [facility]) 05/14/2019  . Vertebral artery narrowing, right 05/12/2019  . Hyperlipidemia 05/06/2019  . CVA (cerebral vascular accident) ([place]) 05/05/2019  . Internal carotid artery occlusion, bilateral   . Covid-19 Virus not Detected   . Status post total replacement of left hip 08/23/2017  . Unilateral primary osteoarthritis, left hip 05/02/2017  . Status post total replacement of right hip 03/22/2017  . PVCs (premature ventricular contractions) 03/14/2017  . Essential hypertension 03/14/2017  . Unilateral primary osteoarthritis, right hip 03/08/2017  . Bilateral hip pain 10/15/2016  . Abnormality of gait 10/15/2016    Carey Bullocks, OTR/L 09/01/2019, 12:44 PM  [facility] [address] [place], [name], [address] Phone: [phone]   Fax:  [phone]  Name: Jose Kaufman MRN: [id number] Date of Birth: 06/29/1951

## 2019-09-03 ENCOUNTER — Other Ambulatory Visit: Payer: Self-pay

## 2019-09-03 ENCOUNTER — Ambulatory Visit: Payer: Commercial Managed Care - PPO | Admitting: Occupational Therapy

## 2019-09-03 DIAGNOSIS — R2681 Unsteadiness on feet: Secondary | ICD-10-CM | POA: Diagnosis not present

## 2019-09-03 DIAGNOSIS — R41840 Attention and concentration deficit: Secondary | ICD-10-CM

## 2019-09-03 DIAGNOSIS — R41842 Visuospatial deficit: Secondary | ICD-10-CM

## 2019-09-03 DIAGNOSIS — R278 Other lack of coordination: Secondary | ICD-10-CM

## 2019-09-03 DIAGNOSIS — I69318 Other symptoms and signs involving cognitive functions following cerebral infarction: Secondary | ICD-10-CM

## 2019-09-03 NOTE — Therapy (Signed)
[facility] [address] [place], [name], [address] Phone: [phone]   Fax:  [phone]  Occupational Therapy Treatment  Patient Details  Name: Jose Kaufman MRN: [id number] Date of Birth: 29-Apr-1951 Referring Provider (OT): Dr. Letta Pate   Encounter Date: 09/03/2019  OT End of Session - 09/03/19 1528    Visit Number  11    Number of Visits  17    Date for OT Re-Evaluation  08/27/19    Authorization Type  [place] - Visit Number  11    Authorization - Number of Visits  15    OT Start Time  9509    OT Stop Time  1530    OT Time Calculation (min)  45 min    Activity Tolerance  Patient tolerated treatment well    Behavior During Therapy  [facility] for tasks assessed/performed       Past Medical History:  Diagnosis Date  . Abnormality of gait 10/15/2016  . Arthritis   . Dysrhythmia    hx of  . Essential hypertension 03/14/2017  . PVCs (premature ventricular contractions) 03/14/2017  . Skin cancer    Skin  . Stroke [facility])    Mini strokes . 06/25/2019  Expressive aphasia - improving  . Wears glasses     Past Surgical History:  Procedure Laterality Date  . COLONOSCOPY    . IR ANGIO INTRA EXTRACRAN SEL COM CAROTID INNOMINATE BILAT MOD SED  05/07/2019  . IR ANGIO INTRA EXTRACRAN SEL COM CAROTID INNOMINATE BILAT MOD SED  07/20/2019  . IR ANGIO VERTEBRAL SEL SUBCLAVIAN INNOMINATE BILAT MOD SED  05/07/2019  . IR ANGIO VERTEBRAL SEL SUBCLAVIAN INNOMINATE UNI R MOD SED  07/20/2019  . IR PATIENT EVAL TECH 0-60 MINS  06/08/2019  . IR TRANSCATH EXCRAN VERT OR CAR A STENT  05/12/2019  . IR TRANSCATH EXCRAN VERT OR CAR A STENT  07/20/2019  . IR US GUIDE VASC ACCESS RIGHT  05/12/2019  . RADIOLOGY WITH ANESTHESIA N/A 05/08/2019   Procedure: RADIOLOGY WITH ANESTHESIA;  Surgeon: [name], MD;  Location: [facility];  Service: Radiology;  Laterality: N/A;  . RADIOLOGY WITH ANESTHESIA N/A 05/12/2019   Procedure: carotid  stenting;  Surgeon: [name], MD;  Location: [facility];  Service: Radiology;  Laterality: N/A;  . RADIOLOGY WITH ANESTHESIA N/A 06/08/2019   Procedure: STENT PLACEMENT;  Surgeon: [name], MD;  Location: [facility];  Service: Radiology;  Laterality: N/A;  . RADIOLOGY WITH ANESTHESIA N/A 07/20/2019   Procedure: RADIOLOGY WITH ANESTHESIA STENTING;  Surgeon: [name], MD;  Location: [facility];  Service: Radiology;  Laterality: N/A;  . SKIN CANCER EXCISION    . TOTAL HIP ARTHROPLASTY Right 03/22/2017   Procedure: RIGHT TOTAL HIP ARTHROPLASTY ANTERIOR APPROACH;  Surgeon: Mcarthur Rossetti, MD;  [facility];  Service: Orthopedics;  Laterality: Right;  . TOTAL HIP ARTHROPLASTY Left 08/23/2017   Procedure: LEFT TOTAL HIP ARTHROPLASTY ANTERIOR APPROACH;  Surgeon: Mcarthur Rossetti, MD;  [facility];  Service: Orthopedics;  Laterality: Left;  . WISDOM TOOTH EXTRACTION      There were no vitals filed for this visit.  Subjective Assessment - 09/03/19 1455    Subjective   Pt's brother asked, "If I drive him, could he go to work?"    Patient is accompanied by:  Family member   brother   Pertinent History  05/05/19: acute Lt frontal and parietal watershed infarcts. Pt also had old corticol and subcortical infarcts  in both frontal and parietal regions, Bil THA 2018, HTN    Limitations  No driving, not currently working,  since surgery - no lifting > 10 lbs, no bending or stooping    Currently in Pain?  No/denies       Assessed remaining goals and progress to date.  9 hole peg test: Rt = 27.16 sec, Lt = 29.22 sec Practiced typing test (numbers only) 1st time = 5 wpm, 61% accuracy, 2nd time = 8 wpm, 100% accuracy Discussed discharge and recommended keeping brain and body active and gave recommendations for cognitive games to keep mind sharp.  Also discussed potential return to work if someone can drive pt there and work involved only rote, familiar tasks.                        OT Short Term Goals - 09/03/19 1529      OT SHORT TERM GOAL #1   Title  Independent with HEP for coordination - 06/26/19    Time  4    Period  Weeks    Status  Achieved      OT SHORT TERM GOAL #2   Title  Improve coordination bilaterally by 5 sec. or more    Baseline  Rt = 38.47 sec, Lt = 33.93 sec    Time  4    Period  Weeks    Status  Partially Met   08/18/2019-RUE 34.19 LUE 29.94- improved but not fully achieved. 09/03/19: Rt = 27.16 sec, Lt = 29.22 sec     OT SHORT TERM GOAL #3   Title  Pt to perform tabletop visual scanning to far Rt and Lt side with no more than min questioning cues    Time  4    Period  Weeks    Status  Achieved      OT SHORT TERM GOAL #4   Title  Pt to perform environmental scanning at 75% accuracy in prep for potential driving    Time  4    Period  Weeks    Status  Partially Met   inconsistently b/t 70-85%       OT Long Term Goals - 09/03/19 1530      OT LONG TERM GOAL #1   Title  Pt to perform simple stovetop meal safely with distant supervision - 07/27/19    Time  8    Period  Weeks    Status  Not Met   Pt needs direct supervision and cues to perform safely     OT LONG TERM GOAL #2   Title  Pt to perform environmental scanning at 90% accuracy while performing simple physical task for multi-tasking and in prep for driving    Time  8    Period  Weeks    Status  Not Met   70-85% (however mostly closer to 70%)     OT LONG TERM GOAL #3   Title  Pt to assemble pipe design in prep for functional assembly projects at home w/o cues    Time  8    Period  Weeks    Status  Achieved      OT LONG TERM GOAL #4   Title  Pt to perform computer tasks including searches at mod I level    Time  8    Period  Weeks    Status  Partially Met   simple searches only     OT LONG TERM  GOAL #5   Title  Pt to type 10 wpm at 80% or greater accuracy (numbers when called out for work simulated tasks)    Time  8     Period  Weeks    Status  Partially Met   08/26/19:  81-91% accuracy with 5wpm copying numbers, but 95+% accuracy with numbers given verbally (and incr speed noted). 09/03/19: 5 wpm, 61% accuracy first time, 5 wpm and 100% accuracy second time.           Plan - 09/03/19 1531    Clinical Impression Statement  Pt has met some goals but other goals have not been met. Pt wishes to d/c today    Occupational performance deficits (Please refer to evaluation for details):  ADL's;IADL's;Work;Social Participation    Body Structure / Function / Physical Skills  IADL;Sensation;Strength;UE functional use;FMC;Coordination;GMC;Balance    Cognitive Skills  Attention;Safety Awareness;Sequencing;Understand;Memory;Perception;Problem Solve    OT Frequency  2x / week    OT Duration  8 weeks    OT Treatment/Interventions  Self-care/ADL training;Therapeutic exercise;Functional Mobility Training;Neuromuscular education;Manual Therapy;Therapeutic activities;DME and/or AE instruction;Cognitive remediation/compensation;Visual/perceptual remediation/compensation;Patient/family education;Passive range of motion    Plan  D/C O.Donnajean Lopes and Agree with Plan of Care  Patient;Family member/caregiver    Family Member Consulted  brother       Patient will benefit from skilled therapeutic intervention in order to improve the following deficits and impairments:   Body Structure / Function / Physical Skills: IADL, Sensation, Strength, UE functional use, FMC, Coordination, GMC, Balance Cognitive Skills: Attention, Safety Awareness, Sequencing, Understand, Memory, Perception, Problem Solve     Visit Diagnosis: Other lack of coordination  Other symptoms and signs involving cognitive functions following cerebral infarction  Attention and concentration deficit  Visuospatial deficit    Problem List Patient Active Problem List   Diagnosis Date Noted  . Vertebral artery stenosis 07/21/2019  . Vertebral artery  narrowing, left 07/20/2019  . Non-fluent aphasia   . Acute bilateral cerebral infarction in a watershed distribution [facility]) 05/14/2019  . Vertebral artery narrowing, right 05/12/2019  . Hyperlipidemia 05/06/2019  . CVA (cerebral vascular accident) ([place]) 05/05/2019  . Internal carotid artery occlusion, bilateral   . Covid-19 Virus not Detected   . Status post total replacement of left hip 08/23/2017  . Unilateral primary osteoarthritis, left hip 05/02/2017  . Status post total replacement of right hip 03/22/2017  . PVCs (premature ventricular contractions) 03/14/2017  . Essential hypertension 03/14/2017  . Unilateral primary osteoarthritis, right hip 03/08/2017  . Bilateral hip pain 10/15/2016  . Abnormality of gait 10/15/2016   OCCUPATIONAL THERAPY DISCHARGE SUMMARY  Visits from Start of Care: 11  Current functional level related to goals / functional outcomes: See above for details   Remaining deficits: Cognition including processing speed/reaction time, divided attention, sequencing, problem solving Visual scanning accuracy *Shuffling gait and bradykinesia also noted   Education / Equipment: HEP's, memory strategies, voc rehab and driving evaluation info  Plan: Patient agrees to discharge.  Patient goals were partially met. Patient is being discharged due to the patient's request.  **RECOMMEND driving evaluation before driving. Pt could possibly return to work if transportation provided and does rote, familiar tasks at work. ?????        Carey Bullocks, OTR/L 09/03/2019, 3:36 PM  [facility] [address] [place], [name], [address] Phone: [phone]   Fax:  [phone]  Name: Jose Kaufman MRN: [id number] Date of Birth: [birth year]

## 2019-09-29 ENCOUNTER — Ambulatory Visit: Payer: Self-pay | Admitting: Neurology

## 2019-10-14 ENCOUNTER — Other Ambulatory Visit (HOSPITAL_COMMUNITY): Payer: Self-pay | Admitting: Interventional Radiology

## 2019-10-14 DIAGNOSIS — I639 Cerebral infarction, unspecified: Secondary | ICD-10-CM

## 2019-10-21 ENCOUNTER — Other Ambulatory Visit: Payer: Self-pay

## 2019-10-21 ENCOUNTER — Ambulatory Visit (HOSPITAL_COMMUNITY)
Admission: RE | Admit: 2019-10-21 | Discharge: 2019-10-21 | Disposition: A | Discharge status: Home / Self Care | Payer: Commercial Managed Care - PPO | Source: Ambulatory Visit | Attending: Interventional Radiology | Admitting: Interventional Radiology

## 2019-10-21 DIAGNOSIS — I639 Cerebral infarction, unspecified: Secondary | ICD-10-CM

## 2019-10-26 ENCOUNTER — Telehealth (HOSPITAL_COMMUNITY): Payer: Self-pay

## 2019-10-26 NOTE — Telephone Encounter (Signed)
Pt agreed to f/u in 3 months with cta head/neck. AW

## 2019-11-11 ENCOUNTER — Other Ambulatory Visit: Payer: Self-pay

## 2019-11-11 ENCOUNTER — Encounter: Payer: Self-pay | Admitting: Neurology

## 2019-11-11 ENCOUNTER — Ambulatory Visit (INDEPENDENT_AMBULATORY_CARE_PROVIDER_SITE_OTHER): Payer: Commercial Managed Care - PPO | Admitting: Neurology

## 2019-11-11 VITALS — BP 149/93 | HR 79 | Temp 97.9°F | Wt 163.0 lb

## 2019-11-11 DIAGNOSIS — I6529 Occlusion and stenosis of unspecified carotid artery: Secondary | ICD-10-CM

## 2019-11-11 DIAGNOSIS — I6523 Occlusion and stenosis of bilateral carotid arteries: Secondary | ICD-10-CM | POA: Diagnosis not present

## 2019-11-11 NOTE — Progress Notes (Signed)
[facility] [address] [place]. [name] [address] [phone]       OFFICE FOLLOW-UP NOTE  Mr. Jose Kaufman Date of Birth:  1951-10-02 Medical Record Number:  [id number]   HPI: Mr. Jose Kaufman is a 58 Caucasian male seen today for initial office follow-up visit following hospital admission for stroke in May 2020.  He is accompanied by his son.  History is obtained from them, review of electronic medical records and have personally reviewed imaging films in PACS.  He presented to [facility] long emergency room on 05/05/2019 with sudden onset of aphasia.  His symptoms actually began a few days prior with difficulty in speech comprehension and word finding.  His coworkers noted something was wrong and advised him to go to the emergency room.  He states actually had somewhat similar symptoms a year prior but did not seek medical attention.  CT scan of the head showed no acute abnormality but MRI scan showed old strokes bilaterally involving frontal and parietal regions with new acute cortical infarct in the left frontoparietal region.  CT angiogram showed left ICA occlusion with right ICA string sign.  4 vessel diagnostic cerebral catheter angiogram was performed which confirmed chronic left ICA occlusion with near occlusive stenosis of proximal right ICA and bilateral vertebral artery origin stenosis.  2D echo showed ejection fraction of 60 to 65% with no wall motion abnormality.  Hemoglobin A1c was 5.7.  LDL cholesterol was elevated at 113 mg percent.  Patient was started on Lipitor 40 mg daily as well as aspirin and Plavix.  He underwent elective right vertebral origin angioplasty stenting on 05/14/2024 with Dr. Estanislado Pandy uneventfully.  He return for right ICA angioplasty stenting on 06/08/2019 with unfortunately the procedure had to be aborted on the table because he started developing hematuria requiring urgent urological consultation.  Patient is rescheduled to undergo right carotid  angioplasty stenting on 06/29/2019 by Dr. the vascular.  Patient states overall is doing much better.  He still has some occasional word finding difficulties when he tries to speak quickly or gets tired.  He is has finished home physical occupational and speech therapy and is currently participating in outpatient therapies.  He remains on Lipitor which is tolerating well without muscle aches and pains.  His blood pressure is well controlled and today it is 141/87. Update 11/11/2019 : He returns for follow-up after last visit 4 months ago.  He states he is doing well and has not had recurrent stroke or TIA symptoms.  He underwent elective left vertebral artery origin angioplasty stenting by Dr. Estanislado Pandy on 07/20/2019 which went uneventfully.  He has had a follow-up ultrasound study done by Dr. Estanislado Pandy on 10/21/2019 which shows high-grade stenosis with string sign right ICA and chronic occlusion of the left ICA.  Both vertebral arteries have antegrade flow.  He remains on aspirin and Brilinta which is tolerating well with minor bruising but no bleeding episodes.  He is also on Lipitor which is tolerating well without muscle aches and pains.  Patient canceled having some dental work including root canal but states is now an emergency and may told him to wait at least till 6 months from the stent so that Laurium can be is stopped prior to the procedure.  He voiced understanding.  He has no new complaints today. ROS:   14 system review of systems is positive for weakness, gait difficulty, decreased hearing, speech and word finding difficulties all other systems negative PMH:  Past Medical History:  Diagnosis  Date   Abnormality of gait 10/15/2016   Arthritis    Dysrhythmia    hx of   Essential hypertension 03/14/2017   PVCs (premature ventricular contractions) 03/14/2017   Skin cancer    Skin   Stroke ([place])    Mini strokes . 06/25/2019  Expressive aphasia - improving   Wears glasses     Social  History:  Social History   Socioeconomic History   Marital status: Divorced    Spouse name: Not on file   Number of children: 2   Years of education: 12   Highest education level: Not on file  Occupational History   Occupation: [employer] resource strain: Not on file   Food insecurity    Worry: Not on file    Inability: Not on file   Transportation needs    Medical: Not on file    Non-medical: Not on file  Tobacco Use   Smoking status: Former Smoker    Types: Cigarettes    Quit date: 05/20/1992    Years since quitting: 27.4   Smokeless tobacco: Never Used  Substance and Sexual Activity   Alcohol use: Not Currently   Drug use: No   Sexual activity: Yes  Lifestyle   Physical activity    Days per week: Not on file    Minutes per session: Not on file   Stress: Not on file  Relationships   Social connections    Talks on phone: Not on file    Gets together: Not on file    Attends religious service: Not on file    Active member of club or organization: Not on file    Attends meetings of clubs or organizations: Not on file    Relationship status: Not on file   Intimate partner violence    Fear of current or ex partner: Not on file    Emotionally abused: Not on file    Physically abused: Not on file    Forced sexual activity: Not on file  Other Topics Concern   Not on file  Social History Narrative   Pt lives at home, divorced   Right-handed   Caffeine: soda daily    Medications:   Current Outpatient Medications on File Prior to Visit  Medication Sig Dispense Refill   aspirin EC 81 MG tablet Take 81 mg by mouth daily.     atorvastatin (LIPITOR) 80 MG tablet Take 1 tablet (80 mg total) by mouth daily at 6 PM. 30 tablet 1   ticagrelor (BRILINTA) 90 MG TABS tablet Take 1 tablet (90 mg total) by mouth 2 (two) times daily. 60 tablet 1   No current facility-administered medications on file prior to visit.     Allergies:    Allergies  Allergen Reactions   Penicillins Other (See Comments)    Childhood reaction. Has patient had a PCN reaction causing immediate rash, facial/tongue/throat swelling, SOB or lightheadedness with hypotension: unsure Has patient had a PCN reaction causing severe rash involving mucus membranes or skin necrosis: unsure Has patient had a PCN reaction that required hospitalization:No Has patient had a PCN reaction occurring within the last 10 years:No If all of the above answers are "NO", then may proceed with Cephalosporin use    Physical Exam General: frail middle-aged Caucasian male, seated, in no evident distress Head: head normocephalic and atraumatic.  Neck: supple with soft right carotid   bruit Cardiovascular: regular rate and rhythm, no murmurs Musculoskeletal: no deformity  Skin:  no rash/petichiae Vascular:  Normal pulses all extremities Vitals:   11/11/19 1408  BP: (!) 149/93  Pulse: 79  Temp: 97.9 F (36.6 C)   Neurologic Exam Mental Status: Awake and fully alert. Oriented to place and time. Recent and remote memory intact. Attention span, concentration and fund of knowledge appropriate. Mood and affect appropriate.  Cranial Nerves: Fundoscopic exam not done. Pupils equal, briskly reactive to light. Extraocular movements full without nystagmus. Visual fields full to confrontation. Hearing intact. Facial sensation intact. Face, tongue, palate moves normally and symmetrically.  Motor: Normal bulk and tone. Normal strength in all tested extremity muscles. Sensory.: intact to touch ,pinprick .position and vibratory sensation.  Coordination: Rapid alternating movements normal in all extremities. Finger-to-nose and heel-to-shin performed accurately bilaterally. Gait and Station: Arises from chair without difficulty. Stance is normal. .  Unable to do tandem walking.  Slight gait initiation difficulty with shuffling. Reflexes: 1+ and symmetric. Toes downgoing.        ASSESSMENT: 68 year old Caucasian male with acute left frontoparietal infarcts in May 2020 likely watershed mechanism given history of chronic left ICA occlusion, near occlusive extracranial right carotid stenosis and severe right vertebral origin stenosis.  He has subsequently had elective right vertebral angioplasty done by Dr Lexine Baton on 6/5/20ar and elective right carotid angioplasty procedure had to be aborted due to patient developing hematuria on the table on 06/08/2019.    PLAN: I had a long d/w patient about his recent stroke, risk for recurrent stroke/TIAs, personally independently reviewed imaging studies and stroke evaluation results and answered questions.Continue aspirin 81 mg  and Brillinta daily  for secondary stroke prevention and maintain strict control of hypertension with blood pressure goal below 130/90, diabetes with hemoglobin A1c goal below 6.5% and lipids with LDL cholesterol goal below 70 mg/dL. I also advised the patient to eat a healthy diet with plenty of whole grains, cereals, fruits and vegetables, exercise regularly and maintain ideal body weight Return for f/u in 6 months with Janett Billow my nurse practitioner orcall earlier if needed. Greater than 50% of time during this 25 minute visit was spent on counseling,explanation of diagnosis, planning of further management, discussion with patient and family and coordination of care Antony Contras, MD Medical Director [place] Pager: [phone] 11/11/2019 2:35 PM  Note: This document was prepared with digital dictation and possible smart phrase technology. Any transcriptional errors that result from this process are unintentional

## 2019-11-11 NOTE — Patient Instructions (Addendum)
I had a long d/w patient about his recent stroke, risk for recurrent stroke/TIAs, personally independently reviewed imaging studies and stroke evaluation results and answered questions.Continue aspirin 81 mg  and Brillinta daily  for secondary stroke prevention and maintain strict control of hypertension with blood pressure goal below 130/90, diabetes with hemoglobin A1c goal below 6.5% and lipids with LDL cholesterol goal below 70 mg/dL. I also advised the patient to eat a healthy diet with plenty of whole grains, cereals, fruits and vegetables, exercise regularly and maintain ideal body weight .Marland Kitchen Patient plans to have dental surgery and may need to hold Brilinta and aspirin for few days prior to the procedure to minimize risk I recommend he wait at least 6 months since his angioplasty stenting and he is agreeable.  Return for follow-up with my nurse practitioner Janett Billow in the future in 6 months or call earlier if necessary

## 2020-01-26 ENCOUNTER — Telehealth (HOSPITAL_COMMUNITY): Payer: Self-pay

## 2020-01-26 ENCOUNTER — Other Ambulatory Visit (HOSPITAL_COMMUNITY): Payer: Self-pay | Admitting: Interventional Radiology

## 2020-01-26 DIAGNOSIS — I6502 Occlusion and stenosis of left vertebral artery: Secondary | ICD-10-CM

## 2020-01-26 NOTE — Telephone Encounter (Signed)
Called to schedule f/u cta head/neck, no answer, left vm. AW 

## 2020-02-01 ENCOUNTER — Other Ambulatory Visit (HOSPITAL_COMMUNITY): Payer: Self-pay | Admitting: Interventional Radiology

## 2020-02-01 DIAGNOSIS — I771 Stricture of artery: Secondary | ICD-10-CM

## 2020-02-02 ENCOUNTER — Telehealth (HOSPITAL_COMMUNITY): Payer: Self-pay

## 2020-02-02 NOTE — Telephone Encounter (Signed)
Returned pt's call. Left vm that Dr. Estanislado Pandy will discuss switching his blood thinners around after he has his CTA. AW

## 2020-02-16 ENCOUNTER — Telehealth: Payer: Self-pay

## 2020-02-16 NOTE — Telephone Encounter (Signed)
Dr. [facility] Called wanting to know if the pt still needs to take antibiotics prior to dental procedures. CB # [phone]  They will also be faxing over paperwork

## 2020-02-16 NOTE — Telephone Encounter (Signed)
Dental office aware he does not need premed for dental appointment

## 2020-02-23 ENCOUNTER — Other Ambulatory Visit: Payer: Self-pay

## 2020-02-23 ENCOUNTER — Ambulatory Visit (HOSPITAL_COMMUNITY)
Admission: RE | Admit: 2020-02-23 | Discharge: 2020-02-23 | Disposition: A | Discharge status: Home / Self Care | Payer: Commercial Managed Care - PPO | Source: Ambulatory Visit | Attending: Interventional Radiology | Admitting: Interventional Radiology

## 2020-02-23 DIAGNOSIS — I771 Stricture of artery: Secondary | ICD-10-CM | POA: Insufficient documentation

## 2020-02-23 LAB — POCT I-STAT CREATININE: Creatinine, Ser: 1 mg/dL (ref 0.61–1.24)

## 2020-02-23 IMAGING — CT CT ANGIO NECK
2 of 11 series · 6 of 34 positions shown · IV contrast (omnipaque)
Comparison: Head MRI [DATE]. CTA neck [DATE]. Angiograms
[DATE], [DATE], and [DATE].

CLINICAL DATA: History of left ICA occlusion and high-grade right
ICA stenosis. Previous stent assisted angioplasty of right and left
vertebral artery origin stenoses.

EXAM:
CT ANGIOGRAPHY HEAD AND NECK
TECHNIQUE: Multidetector CT imaging of the head and neck was performed using
the standard protocol during bolus administration of intravenous
contrast. Multiplanar CT image reconstructions and MIPs were
obtained to evaluate the vascular anatomy. Carotid stenosis
measurements (when applicable) are obtained utilizing NASCET
criteria, using the distal internal carotid diameter as the
denominator.
CONTRAST:  75mL OMNIPAQUE IOHEXOL 350 MG/ML SOLN

[Series 8: sag soft · sagittal · 0.34mm/px · 1 of 59 slices shown]
[im 12/59  soft-tissue]
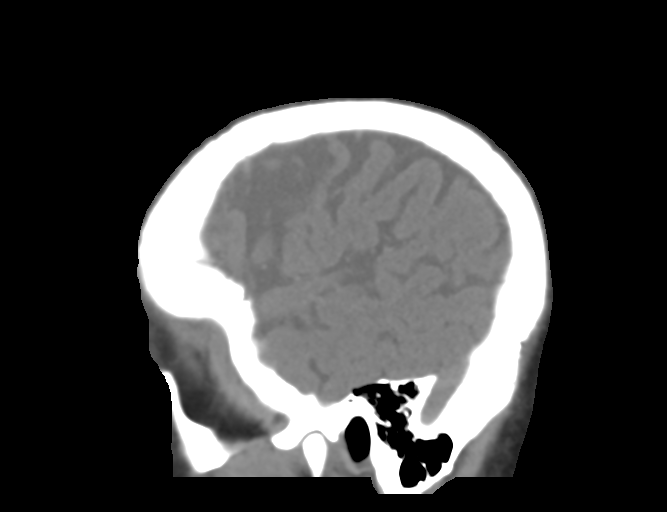

[Series 11: ax thins · axial · 0.39mm/px · z∈[+1022,+1260]mm · 5 of 379 slices shown]
[im 64/379  soft-tissue]
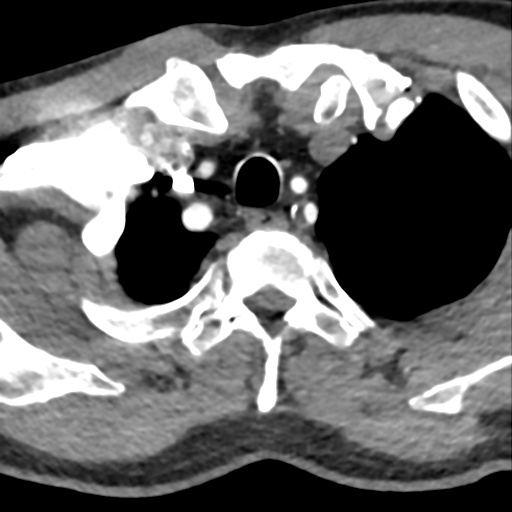
[im 127/379  bone]
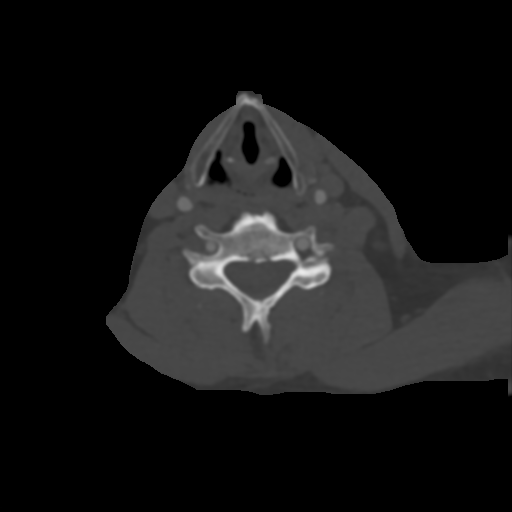
[im 190/379  soft-tissue]
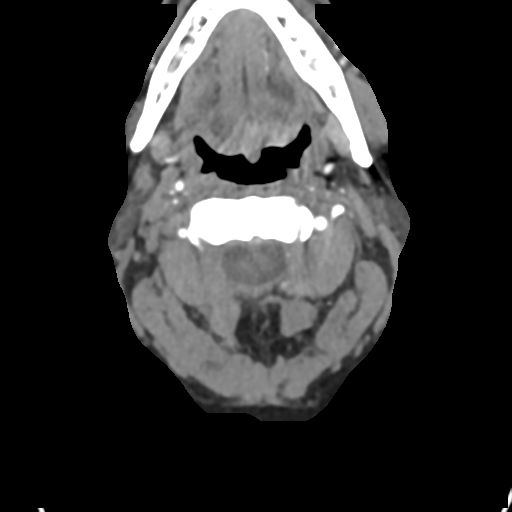
[im 253/379  bone]
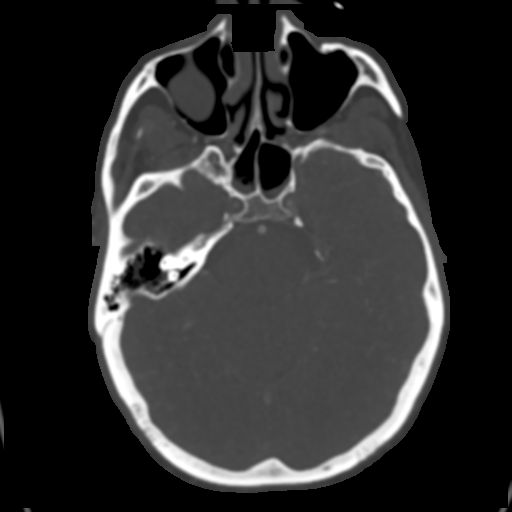
[im 316/379  soft-tissue]
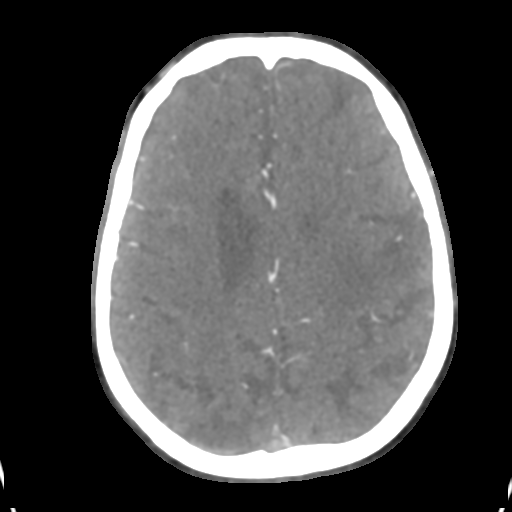

[6 of 34 positions shown; findings below may reference images not displayed]

FINDINGS: CT HEAD FINDINGS

Brain: There is no evidence of acute infarct, intracranial
hemorrhage, mass, midline shift, or extra-axial fluid collection.
Multiple chronic infarcts are again seen involving the frontal and
parietal lobes bilaterally including a moderate-sized chronic
infarct in the posterior left frontal lobe. The ventricles are
normal in size.

Vascular: Calcified atherosclerosis at the skull base.

Skull: No fracture or suspicious osseous lesion.

Sinuses: Mild scattered mucosal thickening in the paranasal sinuses
bilaterally. Minimal sphenoid sinus fluid. Right maxillary sinus
mucous retention cyst. Clear mastoid air cells.

Orbits: Unremarkable.

Review of the MIP images confirms the above findings

CTA NECK FINDINGS

Aortic arch: Normal variant aortic arch branching pattern with
common origin of the brachiocephalic and left common carotid
arteries. Mild atherosclerosis without arch vessel origin stenosis.

Right carotid system: The common carotid artery is widely patent.
There is extensive soft and calcified plaque in the carotid bulb
with chronic near complete occlusion of the proximal ICA with a
radiographic string sign, unchanged from the prior CTA. The more
distal cervical ICA is patent but diffusely small in caliber
consistent with reduced flow.

Left carotid system: Patent common carotid artery with soft plaque
distally resulting in less than 50% narrowing, unchanged. Unchanged
chronic occlusion of the ICA at its origin without reconstitution in
the neck.

Vertebral arteries: Predominantly soft plaque in the proximal left
subclavian artery results in unchanged 50% stenosis. Wide patency of
the right subclavian artery. Both vertebral arteries are patent
including patency of bilateral proximal vertebral artery stents. No
evidence of significant stenosis or dissection. Moderately dominant
left vertebral artery.

Skeleton: Moderate cervical facet arthrosis and lower cervical disc
degeneration.

Other neck: No evidence of cervical lymphadenopathy or mass.

Upper chest: Clear lung apices.

Review of the MIP images confirms the above findings

CTA HEAD FINDINGS

Anterior circulation: The intracranial right ICA is patent with a
string sign extending from the cervical through the proximal
cavernous segments. There is a return to a more normal caliber of
the right ICA from the distal cavernous segment through the
terminus. The left intracranial ICA is occluded proximally with
distal reconstitution beginning in the cavernous segment. ACAs and
MCAs are patent with mildly attenuated appearance of the A1 and M1
segments and more normal caliber of the branch vessels. There are
mild right and moderate left A1 stenoses. There is moderate
narrowing of the left M2 inferior division proximally. There is also
mild asymmetric atherosclerotic irregularity and narrowing of more
distal left MCA branch vessels compared to the right, and there is
moderate bilateral distal A2 narrowing. No aneurysm is identified.

Posterior circulation: The intracranial vertebral arteries are
widely patent to the basilar. Patent bilateral PICAs, right AICA,
and bilateral SCAs are visualized. The basilar artery is patent
without significant stenosis. There are patent posterior
communicating arteries bilaterally. Both PCAs are patent with mild
atherosclerotic irregularity primarily involving the branch vessels
but without evidence of a significant proximal PCA stenosis. No
aneurysm is identified.

Venous sinuses: Poorly evaluated due to arterial contrast timing.

Anatomic variants: None.

Review of the MIP images confirms the above findings
IMPRESSION: 1. Chronic occlusion of the proximal left ICA with distal
reconstitution.
2. Unchanged near complete occlusion of the proximal right ICA with
a string sign throughout the cervical and proximal intracranial
segments.
3. Patent bilateral vertebral artery stents.
4. Intracranial atherosclerosis as above.
5. Unchanged 50% left subclavian artery stenosis.
6.  Aortic Atherosclerosis ([B6]-[B6]).

## 2020-02-23 MED ORDER — IOHEXOL 350 MG/ML SOLN
75.0000 mL | Freq: Once | INTRAVENOUS | Status: AC | PRN
  Administered 2020-02-23: 75 mL via INTRAVENOUS

## 2020-02-24 ENCOUNTER — Telehealth (HOSPITAL_COMMUNITY): Payer: Self-pay

## 2020-02-24 NOTE — Telephone Encounter (Signed)
Pt called to find out about cta results. Informed him that Dr. Estanislado Pandy hasn't looked at it yet. Will call him later today once he views. Pt agreed with this plan. AW

## 2020-03-30 ENCOUNTER — Other Ambulatory Visit (HOSPITAL_COMMUNITY): Payer: Self-pay | Admitting: Interventional Radiology

## 2020-03-30 ENCOUNTER — Telehealth (HOSPITAL_COMMUNITY): Payer: Self-pay | Admitting: Radiology

## 2020-03-30 DIAGNOSIS — I771 Stricture of artery: Secondary | ICD-10-CM

## 2020-03-30 NOTE — Telephone Encounter (Signed)
Called pt, left VM for him to call to schedule RICA tx with [name]. JM

## 2020-04-16 ENCOUNTER — Other Ambulatory Visit (HOSPITAL_COMMUNITY)
Admission: RE | Admit: 2020-04-16 | Discharge: 2020-04-16 | Disposition: A | Discharge status: Home / Self Care | Payer: Commercial Managed Care - PPO | Source: Ambulatory Visit | Attending: Interventional Radiology | Admitting: Interventional Radiology

## 2020-04-16 DIAGNOSIS — Z01812 Encounter for preprocedural laboratory examination: Secondary | ICD-10-CM | POA: Diagnosis present

## 2020-04-16 DIAGNOSIS — Z20822 Contact with and (suspected) exposure to covid-19: Secondary | ICD-10-CM | POA: Insufficient documentation

## 2020-04-16 LAB — SARS CORONAVIRUS 2 (TAT 6-24 HRS): SARS Coronavirus 2: NEGATIVE

## 2020-04-19 ENCOUNTER — Other Ambulatory Visit: Payer: Self-pay | Admitting: Radiology

## 2020-04-19 ENCOUNTER — Encounter (HOSPITAL_COMMUNITY): Payer: Self-pay | Admitting: Interventional Radiology

## 2020-04-19 NOTE — Anesthesia Preprocedure Evaluation (Addendum)
Anesthesia Evaluation  Patient identified by MRN, date of birth, ID band Patient awake    Reviewed: Allergy & Precautions, NPO status , Patient's Chart, lab work & pertinent test results  Airway Mallampati: II  TM Distance: >3 FB Neck ROM: Full    Dental no notable dental hx. (+) Teeth Intact, Dental Advisory Given   Pulmonary neg pulmonary ROS, former smoker,    Pulmonary exam normal breath sounds clear to auscultation       Cardiovascular hypertension, Normal cardiovascular exam+ dysrhythmias  Rhythm:Regular Rate:Normal  05/05/19 Echo 1. The left ventricle has normal systolic function with an ejection  fraction of 60-65%. The cavity size was normal. Left ventricular diastolic  Doppler parameters are consistent with impaired relaxation. No evidence of  left ventricular regional wall  motion abnormalities.  2. The right ventricle has normal systolic function. The cavity was  normal. There is no increase in right ventricular wall thickness. Right  ventricular systolic pressure could not be assessed.  3. The aortic root and ascending aorta are normal in size and structure.    Neuro/Psych Patient reports legs weaker since strokes TIACVA, No Residual Symptoms negative psych ROS   GI/Hepatic negative GI ROS, Neg liver ROS,   Endo/Other  negative endocrine ROS  Renal/GU negative Renal ROS     Musculoskeletal  (+) Arthritis ,   Abdominal   Peds  Hematology negative hematology ROS (+)   Anesthesia Other Findings   Reproductive/Obstetrics                          Anesthesia Physical Anesthesia Plan  ASA: II  Anesthesia Plan: General   Post-op Pain Management:    Induction: Intravenous  PONV Risk Score and Plan: 3 and Treatment may vary due to age or medical condition, Ondansetron and Dexamethasone  Airway Management Planned: Nasal Cannula and Natural Airway  Additional Equipment:  None  Intra-op Plan:   Post-operative Plan:   Informed Consent: I have reviewed the patients History and Physical, chart, labs and discussed the procedure including the risks, benefits and alternatives for the proposed anesthesia with the patient or authorized representative who has indicated his/her understanding and acceptance.     Dental advisory given  Plan Discussed with: CRNA  Anesthesia Plan Comments: [facility] Start w Mac w GA w Aline backup if plans change)     Anesthesia Quick Evaluation

## 2020-04-19 NOTE — Progress Notes (Signed)
Spoke with Jose Kaufman for pre-op call. Jose Kaufman denies cardiac history, chart has hx of PVC's. Jose Kaufman states he is not diabetic.   Covid test done 04/16/20 and it's negative. Jose Kaufman states he's been in quarantine since the test and I instructed him to stay in quarantine until he comes to the hospital Wednesday. He voiced understanding.  Jose Kaufman requests that we let Dr. Estanislado Pandy know that he would like to have a condom catheter like he had in August again with this procedure.

## 2020-04-20 ENCOUNTER — Ambulatory Visit (HOSPITAL_COMMUNITY): Payer: Commercial Managed Care - PPO | Admitting: Anesthesiology

## 2020-04-20 ENCOUNTER — Ambulatory Visit (HOSPITAL_COMMUNITY)
Admission: RE | Admit: 2020-04-20 | Discharge: 2020-04-20 | Disposition: A | Discharge status: Home / Self Care | Payer: Commercial Managed Care - PPO | Source: Ambulatory Visit | Attending: Interventional Radiology | Admitting: Interventional Radiology

## 2020-04-20 ENCOUNTER — Encounter (HOSPITAL_COMMUNITY): Payer: Self-pay | Admitting: Interventional Radiology

## 2020-04-20 ENCOUNTER — Ambulatory Visit (HOSPITAL_COMMUNITY)
Admission: RE | Admit: 2020-04-20 | Discharge: 2020-04-20 | Disposition: A | Discharge status: Home / Self Care | Payer: Commercial Managed Care - PPO | Attending: Interventional Radiology | Admitting: Interventional Radiology

## 2020-04-20 ENCOUNTER — Encounter (HOSPITAL_COMMUNITY)
Admission: RE | Disposition: A | Discharge status: Home / Self Care | Payer: Self-pay | Source: Home / Self Care | Attending: Interventional Radiology

## 2020-04-20 ENCOUNTER — Other Ambulatory Visit: Payer: Self-pay

## 2020-04-20 DIAGNOSIS — Z8673 Personal history of transient ischemic attack (TIA), and cerebral infarction without residual deficits: Secondary | ICD-10-CM | POA: Diagnosis not present

## 2020-04-20 DIAGNOSIS — Z79899 Other long term (current) drug therapy: Secondary | ICD-10-CM | POA: Insufficient documentation

## 2020-04-20 DIAGNOSIS — Z87891 Personal history of nicotine dependence: Secondary | ICD-10-CM | POA: Insufficient documentation

## 2020-04-20 DIAGNOSIS — I10 Essential (primary) hypertension: Secondary | ICD-10-CM | POA: Insufficient documentation

## 2020-04-20 DIAGNOSIS — Z7982 Long term (current) use of aspirin: Secondary | ICD-10-CM | POA: Diagnosis not present

## 2020-04-20 DIAGNOSIS — I771 Stricture of artery: Secondary | ICD-10-CM

## 2020-04-20 DIAGNOSIS — I6521 Occlusion and stenosis of right carotid artery: Secondary | ICD-10-CM | POA: Diagnosis present

## 2020-04-20 HISTORY — PX: IR ANGIO VERTEBRAL SEL SUBCLAVIAN INNOMINATE UNI R MOD SED: IMG5365

## 2020-04-20 HISTORY — PX: IR ANGIO INTRA EXTRACRAN SEL COM CAROTID INNOMINATE UNI R MOD SED: IMG5359

## 2020-04-20 HISTORY — PX: RADIOLOGY WITH ANESTHESIA: SHX6223

## 2020-04-20 HISTORY — PX: IR ANGIO VERTEBRAL SEL VERTEBRAL UNI L MOD SED: IMG5367

## 2020-04-20 LAB — CBC WITH DIFFERENTIAL/PLATELET
Abs Immature Granulocytes: 0.02 10*3/uL (ref 0.00–0.07)
Basophils Absolute: 0.1 10*3/uL (ref 0.0–0.1)
Basophils Relative: 1 %
Eosinophils Absolute: 0.3 10*3/uL (ref 0.0–0.5)
Eosinophils Relative: 5 %
HCT: 49 % (ref 39.0–52.0)
Hemoglobin: 15.9 g/dL (ref 13.0–17.0)
Immature Granulocytes: 0 %
Lymphocytes Relative: 14 %
Lymphs Abs: 0.9 10*3/uL (ref 0.7–4.0)
MCH: 30.1 pg (ref 26.0–34.0)
MCHC: 32.4 g/dL (ref 30.0–36.0)
MCV: 92.6 fL (ref 80.0–100.0)
Monocytes Absolute: 0.5 10*3/uL (ref 0.1–1.0)
Monocytes Relative: 8 %
Neutro Abs: 4.6 10*3/uL (ref 1.7–7.7)
Neutrophils Relative %: 72 %
Platelets: 168 10*3/uL (ref 150–400)
RBC: 5.29 MIL/uL (ref 4.22–5.81)
RDW: 13.1 % (ref 11.5–15.5)
WBC: 6.3 10*3/uL (ref 4.0–10.5)
nRBC: 0 % (ref 0.0–0.2)

## 2020-04-20 LAB — BASIC METABOLIC PANEL
Anion gap: 7 (ref 5–15)
BUN: 16 mg/dL (ref 8–23)
CO2: 22 mmol/L (ref 22–32)
Calcium: 8.8 mg/dL — ABNORMAL LOW (ref 8.9–10.3)
Chloride: 109 mmol/L (ref 98–111)
Creatinine, Ser: 1.06 mg/dL (ref 0.61–1.24)
GFR calc Af Amer: >60 mL/min (ref >60)
GFR calc non Af Amer: >60 mL/min (ref >60)
Glucose, Bld: 115 mg/dL — ABNORMAL HIGH (ref 70–99)
Potassium: 3.7 mmol/L (ref 3.5–5.1)
Sodium: 138 mmol/L (ref 135–145)

## 2020-04-20 LAB — POCT ACTIVATED CLOTTING TIME: Activated Clotting Time: 142 seconds

## 2020-04-20 LAB — URINALYSIS, COMPLETE (UACMP) WITH MICROSCOPIC
Bacteria, UA: NONE SEEN
Bilirubin Urine: NEGATIVE
Glucose, UA: NEGATIVE mg/dL
Ketones, ur: NEGATIVE mg/dL
Leukocytes,Ua: NEGATIVE
Nitrite: NEGATIVE
Protein, ur: NEGATIVE mg/dL
Specific Gravity, Urine: 1.018 (ref 1.005–1.030)
pH: 5 (ref 5.0–8.0)

## 2020-04-20 LAB — PROTIME-INR
INR: 1.1 (ref 0.8–1.2)
Prothrombin Time: 13.3 seconds (ref 11.4–15.2)

## 2020-04-20 LAB — PLATELET INHIBITION P2Y12: Platelet Function  P2Y12: 41 [PRU] — ABNORMAL LOW (ref 182–335)

## 2020-04-20 IMAGING — XA IR CARO CERE HEAD/NECK UNILAT RIGHT (MS)
1 series · 12 of 24 positions shown · IV contrast (IODINE)
Comparison: Arteriogram [DATE], and [DATE].

CLINICAL DATA: History of left internal carotid artery occlusion,
severe bilateral vertebral artery origin arteriosclerotic related
stenosis post stenting, and severe stenosis of the right internal
carotid artery proximally with string sign.

EXAM:
IR ANGIO INTRA EXTRACRAN SEL COM CAROTID INNOMINATE UNI RIGHT MOD
SED
TECHNIQUE: Informed written consent was obtained from the patient after a
thorough discussion of the procedural risks, benefits and
alternatives. All questions were addressed. Maximal Sterile Barrier
Technique was utilized including caps, mask, sterile gowns, sterile
gloves, sterile drape, hand hygiene and skin antiseptic. A timeout
was performed prior to the initiation of the procedure.

[Series 300: dr. (person_name) · 12 of 129 slices shown]
[im 6/129]
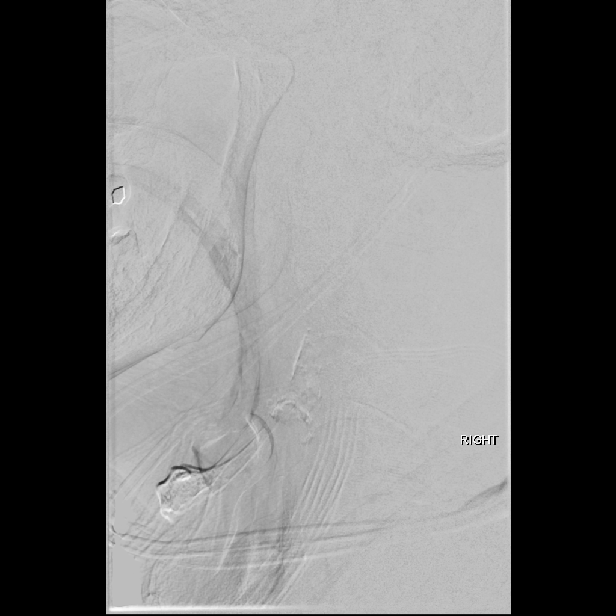
[im 17/129]
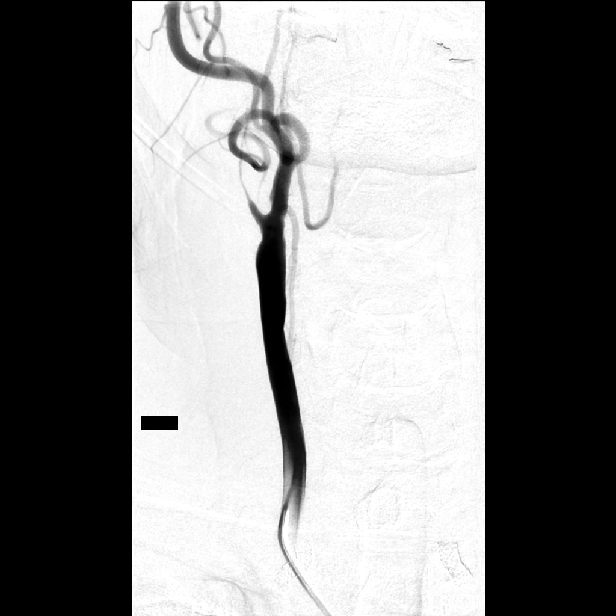
[im 28/129]
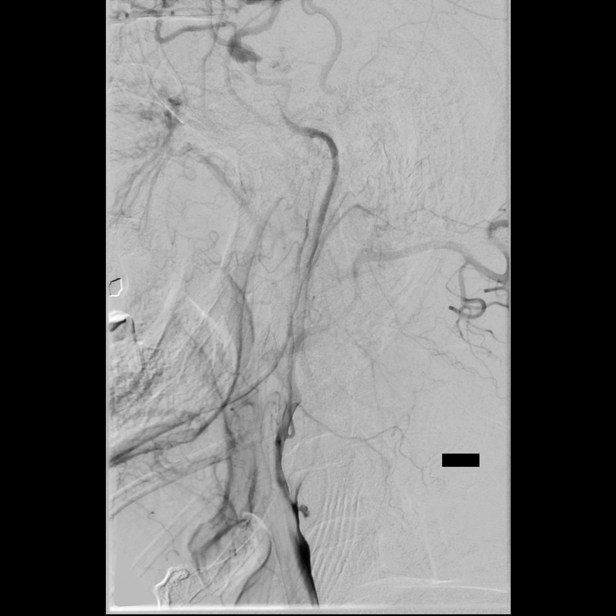
[im 39/129]
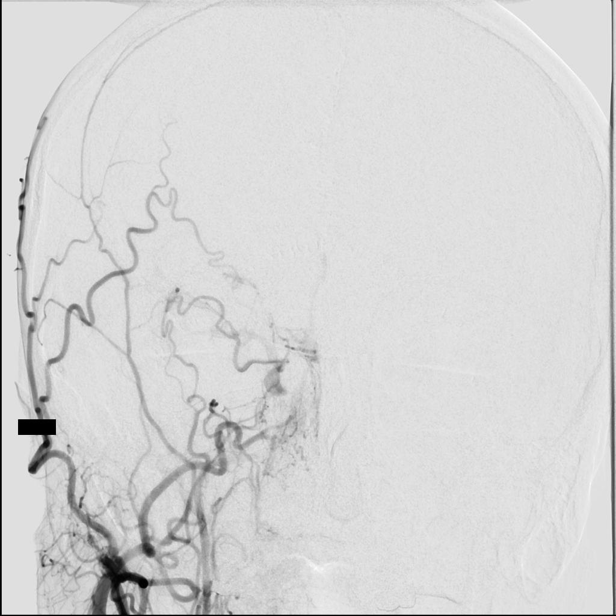
[im 51/129]
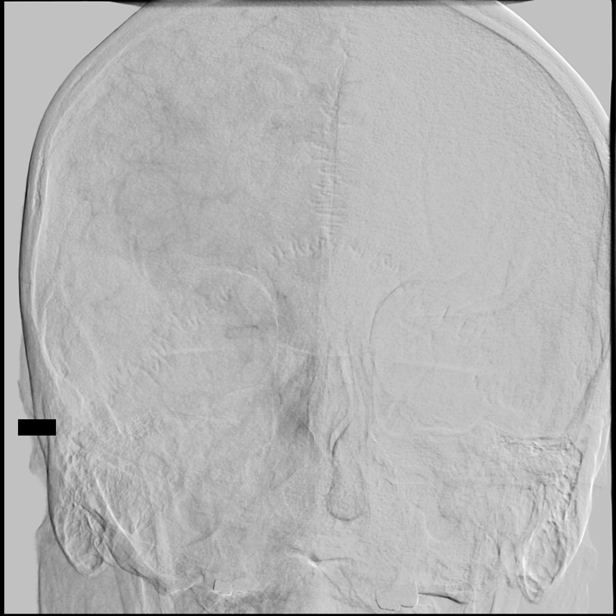
[im 62/129]
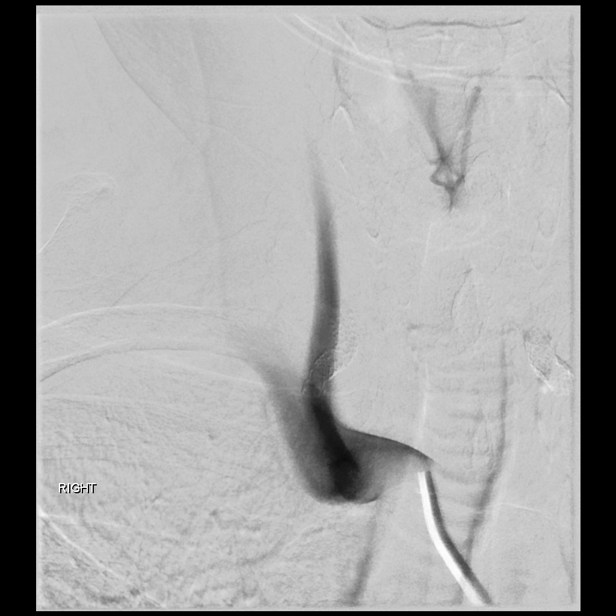
[im 73/129]
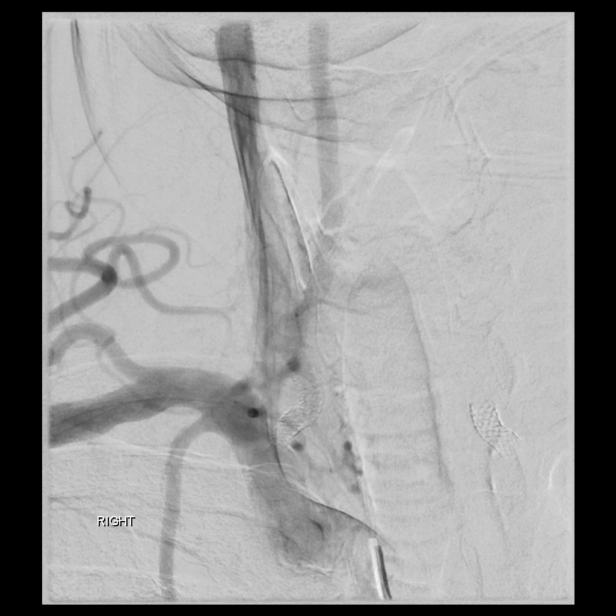
[im 84/129]
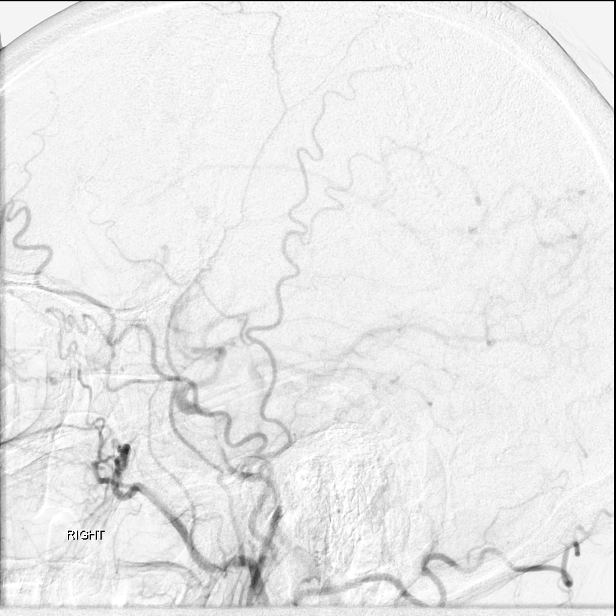
[im 95/129]
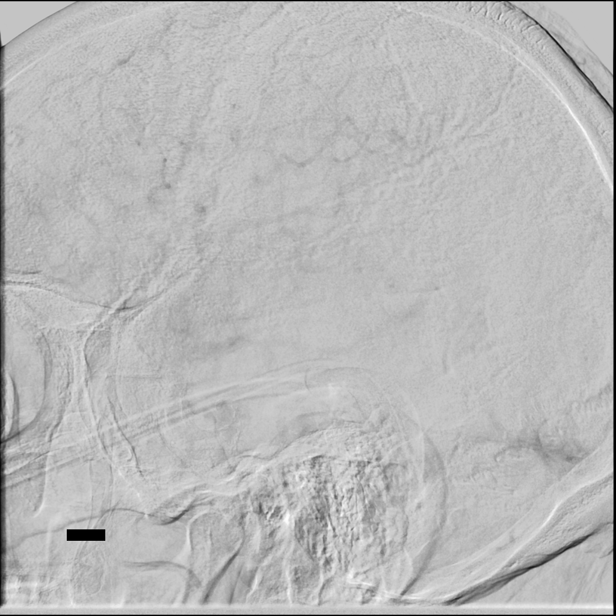
[im 106/129]
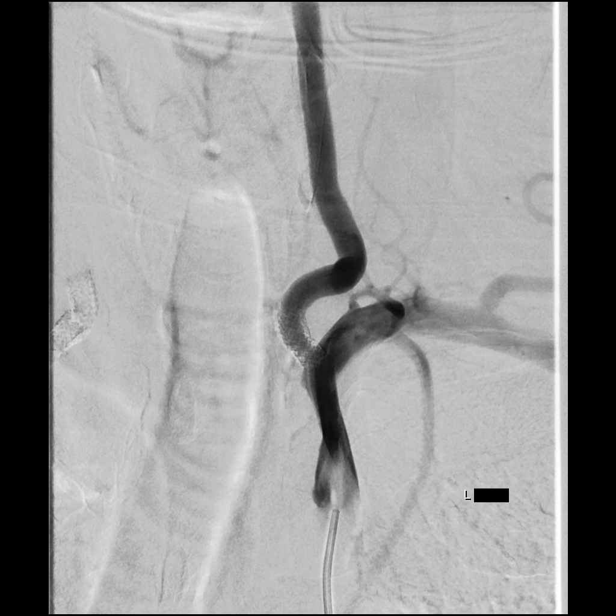
[im 117/129]
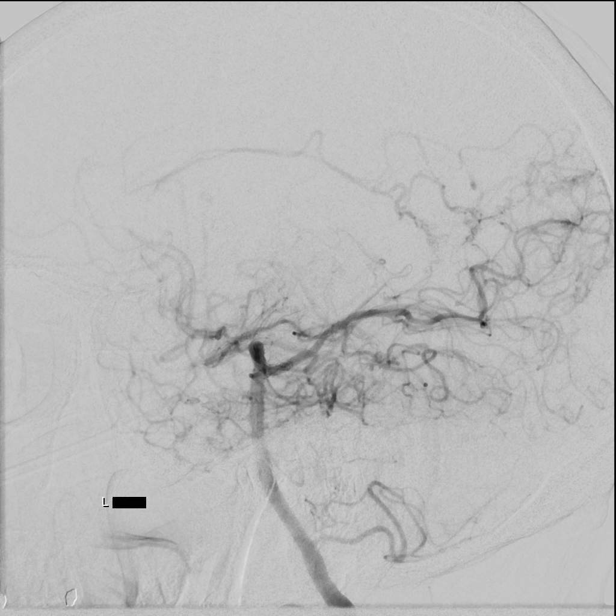
[im 129/129]
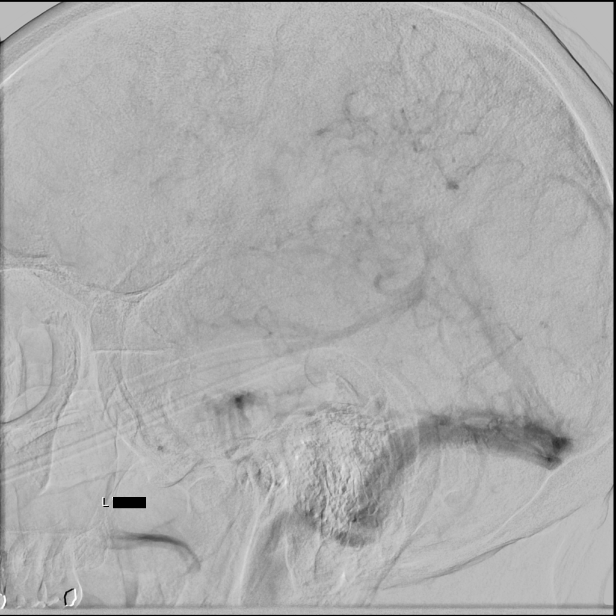

[12 of 24 positions shown; findings below may reference images not displayed]

MEDICATIONS:
Heparin [17] units IV. No antibiotic was administered within 1 hour
of the procedure.

ANESTHESIA/SEDATION:
Mac anesthesia as per [REDACTED] at [REDACTED].

CONTRAST:  Isovue 300 approximately 50 mL.

FLUOROSCOPY TIME:  Fluoroscopy Time: 9 minutes 12 seconds 628 mGy).

COMPLICATIONS:
None immediate.
The right groin was prepped and draped in the usual sterile fashion.
Thereafter using modified Seldinger technique, transfemoral access
into the right common femoral artery was obtained without
difficulty. Over a 0.035 inch guidewire, a 5 French Pinnacle sheath
was inserted. Through this, and also over 0.035 inch guidewire, a 5
French JB 1 catheter was advanced to the aortic arch region and
selectively positioned in the right common carotid artery,
innominate artery, and the left vertebral artery.
FINDINGS: The innominate arteriogram demonstrates the origin of the right
subclavian artery and the right common carotid artery to be widely
patent.

Wide patency is also seen of the stented segment at the origin of
the right vertebral artery with approximately 50% stenosis.

More distally the right vertebral artery is seen to opacify to the
cranial skull base. Patency is maintained of the right
vertebrobasilar junction and the right posterior-inferior cerebellar
artery with flow seen more distally in the basilar artery, the right
anterior inferior cerebellar artery, and the right posterior
cerebral artery with flash filling of the right posterior
communicating artery.

The right common carotid arteriogram demonstrates the right external
carotid artery to be mildly narrowed at its origin.

Asymmetric prominence of the internal maxillary artery is seen with
branches of the external carotid artery to be widely patent.

There is string sign caliber of the proximal right internal carotid
artery to the cranial skull base. The previously noted severe
stenosis at the petrous cavernous junction appears slightly more
prominent. Distal to this there is reconstitution of the cavernous
and the supraclinoid right ICA. Retrograde reinforcement is noted
from the ipsilateral ethmoidal, and the nasal lacrimal artery via
the ophthalmic artery.

Opacification is seen of the right middle cerebral artery to the
trifurcations, and also the right anterior cerebral artery into the
distal A2 segment.

Unopacified blood is seen in the supraclinoid right ICA probably
retrograde from the posterior circulation via the posterior
communicating artery.

The origin of the left vertebral artery stented portion demonstrates
approximately 20% narrowing. There was brisk opacification via the
dominant vertebral artery to the cranial skull base. The left
vertebrobasilar junction, the left posteroinferior cerebellar
artery, the basilar artery, the posterior cerebral arteries, the
superior cerebellar arteries and the anterior-inferior cerebellar
arteries are widely patent.

There is brisk prominent retrograde opacification via both posterior
communicating arteries supplying the left and right middle cerebral
artery distributions.

The lateral projection also demonstrates retrograde opacification
and reconstitution of the distal pericallosal artery in the splenium
region from the left posterior cerebral artery P3 branches.

Similar, though less prominent opacification pattern is seen
involving the right anterior cerebral artery distally.

Patency of the dural venous sinuses and the internal jugular veins
is seen.
IMPRESSION: Interval mild improvement in the caliber of the right internal
carotid proximally to the petrous cavernous junction. Retrograde
opacification of the right internal carotid artery cavernous segment
from the nasal lacrimal, and the ethmoidal branches of the right
external carotid artery via the ophthalmic artery with subsequent
opacification of the right MCA and the right anterior cerebral
artery distribution.

Approximately 50% stenosis within the stented origin of the right
vertebral artery.

Approximately 20% stenosis of the stented origin of the dominant
left vertebral artery.

Retrograde reconstitution of the left anterior and the left middle
cerebral artery via the left posterior communicating artery, and to
a lesser degree of the right anterior and the right middle cerebral
artery distribution via the right posterior communicating artery
from vertebral arteries bilaterally.

PLAN:
Findings reviewed with patient. With the patient asymptomatic at
this time and angiographic findings, it was decided to continue with
conservative management with dual antiplatelets. This was explained
to the patient. A follow-up CT angiogram of the head and neck will
be obtained in 6 months time. Should the patient develop any new
symptoms such as visual changes, blurred vision, diplopia, gait
instability, lateralized weakness, paresthesias or speech
difficulties, he was instructed to call [phone].

Patient expressed understanding and agreement with the above
management plan.

## 2020-04-20 SURGERY — RADIOLOGY WITH ANESTHESIA
Anesthesia: General

## 2020-04-20 MED ORDER — HEPARIN SODIUM (PORCINE) 1000 UNIT/ML IJ SOLN
INTRAMUSCULAR | Status: DC | PRN
  Administered 2020-04-20: 1000 [IU] via INTRAVENOUS

## 2020-04-20 MED ORDER — LACTATED RINGERS IV SOLN: INTRAVENOUS | Status: DC | PRN

## 2020-04-20 MED ORDER — FENTANYL CITRATE (PF) 100 MCG/2ML IJ SOLN
INTRAMUSCULAR | Status: DC | PRN
  Administered 2020-04-20: 25 ug via INTRAVENOUS

## 2020-04-20 MED ORDER — ASPIRIN EC 325 MG PO TBEC: 325.0000 mg | DELAYED_RELEASE_TABLET | ORAL | Status: DC

## 2020-04-20 MED ORDER — VANCOMYCIN HCL 1000 MG IV SOLR: 1000.0000 mg | INTRAVENOUS | Status: DC

## 2020-04-20 MED ORDER — VANCOMYCIN HCL IN DEXTROSE 1-5 GM/200ML-% IV SOLN
1000.0000 mg | INTRAVENOUS | Status: DC
  Filled 2020-04-20: qty 200

## 2020-04-20 MED ORDER — NIMODIPINE 30 MG PO CAPS: 0.0000 mg | ORAL_CAPSULE | ORAL | Status: DC

## 2020-04-20 MED ORDER — ONDANSETRON HCL 4 MG/2ML IJ SOLN
INTRAMUSCULAR | Status: DC | PRN
  Administered 2020-04-20: 4 mg via INTRAVENOUS

## 2020-04-20 MED ORDER — LIDOCAINE HCL (PF) 1 % IJ SOLN
INTRAMUSCULAR | Status: DC | PRN
  Administered 2020-04-20: 5 mL

## 2020-04-20 MED ORDER — IOHEXOL 300 MG/ML  SOLN
150.0000 mL | Freq: Once | INTRAMUSCULAR | Status: AC | PRN
  Administered 2020-04-20: 60 mL via INTRA_ARTERIAL

## 2020-04-20 MED ORDER — SODIUM CHLORIDE 0.9 % IV SOLN: INTRAVENOUS | Status: DC

## 2020-04-20 MED ORDER — SODIUM CHLORIDE 0.9 % IV SOLN: INTRAVENOUS | Status: AC

## 2020-04-20 MED ORDER — MIDAZOLAM HCL 5 MG/5ML IJ SOLN
INTRAMUSCULAR | Status: DC | PRN
  Administered 2020-04-20: 1 mg via INTRAVENOUS

## 2020-04-20 MED ORDER — LIDOCAINE HCL 1 % IJ SOLN
INTRAMUSCULAR | Status: AC
  Filled 2020-04-20: qty 20

## 2020-04-20 NOTE — Anesthesia Postprocedure Evaluation (Signed)
Anesthesia Post Note  Patient: Jose Kaufman  Procedure(s) Performed: RADIOLOGY WITH ANESTHESIA STENTING (N/A )     Patient location during evaluation: PACU Anesthesia Type: MAC Level of consciousness: awake and alert Pain management: pain level controlled Vital Signs Assessment: post-procedure vital signs reviewed and stable Respiratory status: spontaneous breathing, nonlabored ventilation, respiratory function stable and patient connected to nasal cannula oxygen Cardiovascular status: stable and blood pressure returned to baseline Postop Assessment: no apparent nausea or vomiting Anesthetic complications: no    Last Vitals:  Vitals:   04/20/20 1130 04/20/20 1145  BP: (!) 152/80 (!) 142/81  Pulse: 82 74  Resp: 15 14  Temp:    SpO2: 100% 100%    Last Pain:  Vitals:   04/20/20 1045  PainSc: 0-No pain                 Barnet Glasgow

## 2020-04-20 NOTE — Procedures (Signed)
S/P Rt common ,and bilateral vertebral artrriograms. RT CFA approach. Findings. 1.Mildly improved caliber of the RT ICA prox with brisk opacification to the prox cavernous seg.  2.sig retrograde opacification of the cavernous and supraclinoid   RT ICA via the ethmoidal and ophthalmic artery supplying the Rt MCA. 3 Approx 50 % stenosis of the RT VA stented origin,and approx 20 % stenosis of  the dominant Lt VA stented origin. S.Freeland Pracht MD

## 2020-04-20 NOTE — H&P (Signed)
Chief Complaint: Patient was seen in consultation today for cerebral arteriogram with Right internal carotid artery angioplasty/stent placement at the request of Dr Royal Hawthorn   Supervising Physician: [name]  Patient Status: [facility]  History of Present Illness: Jose Kaufman is a 69 y.o. male   Known to [facility] CVA 04/2019 05/12/19:  endovascular revascularization of high-grade pre occlusive right vertebral artery origin with stent assisted angioplasty 07/20/19:  stent assisted angioplasty of dominant left vertebral artery origin stenosis without event.  02/23/20: CTA: IMPRESSION: 1. Chronic occlusion of the proximal left ICA with distal reconstitution. 2. Unchanged near complete occlusion of the proximal right ICA with a string sign throughout the cervical and proximal intracranial segments. 3. Patent bilateral vertebral artery stents. 4. Intracranial atherosclerosis as above. 5. Unchanged 50% left subclavian artery stenosis.  Pt states he still has some word finding difficulties-- but much improved Occasional stumbles in gait-- when first starts out; never falls. Uses cane Denies vision changes Denies headaches Denies N/V  Scheduled today for Cerebral arteriogram with R ICA angioplasty/stent placement  Past Medical History:  Diagnosis Date  . Abnormality of gait 10/15/2016  . Arthritis   . Dysrhythmia    hx of  . Essential hypertension 03/14/2017  . PVCs (premature ventricular contractions) 03/14/2017  . Skin cancer    Skin - basal  . Stroke ([place])    Mini strokes . 06/25/2019  Expressive aphasia - improving  . Wears glasses     Past Surgical History:  Procedure Laterality Date  . COLONOSCOPY    . IR ANGIO INTRA EXTRACRAN SEL COM CAROTID INNOMINATE BILAT MOD SED  05/07/2019  . IR ANGIO INTRA EXTRACRAN SEL COM CAROTID INNOMINATE BILAT MOD SED  07/20/2019  . IR ANGIO VERTEBRAL SEL SUBCLAVIAN INNOMINATE BILAT MOD SED  05/07/2019  . IR ANGIO VERTEBRAL SEL  SUBCLAVIAN INNOMINATE UNI R MOD SED  07/20/2019  . IR PATIENT EVAL TECH 0-60 MINS  06/08/2019  . IR TRANSCATH EXCRAN VERT OR CAR A STENT  05/12/2019  . IR TRANSCATH EXCRAN VERT OR CAR A STENT  07/20/2019  . IR US GUIDE VASC ACCESS RIGHT  05/12/2019  . RADIOLOGY WITH ANESTHESIA N/A 05/08/2019   Procedure: RADIOLOGY WITH ANESTHESIA;  Surgeon: [name], MD;  Location: [place];  Service: Radiology;  Laterality: N/A;  . RADIOLOGY WITH ANESTHESIA N/A 05/12/2019   Procedure: carotid stenting;  Surgeon: [name], MD;  Location: [place];  Service: Radiology;  Laterality: N/A;  . RADIOLOGY WITH ANESTHESIA N/A 06/08/2019   Procedure: STENT PLACEMENT;  Surgeon: [name], MD;  Location: [place];  Service: Radiology;  Laterality: N/A;  . RADIOLOGY WITH ANESTHESIA N/A 07/20/2019   Procedure: RADIOLOGY WITH ANESTHESIA STENTING;  Surgeon: [name], MD;  Location: [place];  Service: Radiology;  Laterality: N/A;  . SKIN CANCER EXCISION    . TOTAL HIP ARTHROPLASTY Right 03/22/2017   Procedure: RIGHT TOTAL HIP ARTHROPLASTY ANTERIOR APPROACH;  Surgeon: Mcarthur Rossetti, MD;  Location: [facility];  Service: Orthopedics;  Laterality: Right;  . TOTAL HIP ARTHROPLASTY Left 08/23/2017   Procedure: LEFT TOTAL HIP ARTHROPLASTY ANTERIOR APPROACH;  Surgeon: Mcarthur Rossetti, MD;  Location: [facility];  Service: Orthopedics;  Laterality: Left;  . WISDOM TOOTH EXTRACTION      Allergies: Penicillins  Medications: Prior to Admission medications   Medication Sig Start Date End Date Taking? Authorizing Provider  aspirin EC 81 MG tablet Take 81 mg by mouth daily.   Yes [provider]  atorvastatin (  LIPITOR) 80 MG tablet Take 1 tablet (80 mg total) by mouth daily at 6 PM. 05/22/19  Yes Love, Ivan Anchors, PA-C  ticagrelor (BRILINTA) 60 MG TABS tablet Take 60 mg by mouth 2 (two) times daily.   Yes [provider]     Family History  Problem Relation Age of Onset  . Leukemia Mother   .  Pneumonia Father     Social History   Socioeconomic History  . Marital status: Divorced    Spouse name: Not on file  . Number of children: 2  . Years of education: 65  . Highest education level: Not on file  Occupational History  . Occupation: [place] [employer]  Tobacco Use  . Smoking status: Former Smoker    Types: Cigarettes    Quit date: 05/20/1992    Years since quitting: 27.9  . Smokeless tobacco: Never Used  Substance and Sexual Activity  . Alcohol use: Not Currently  . Drug use: No  . Sexual activity: Yes  Other Topics Concern  . Not on file  Social History Narrative   Pt lives at home, divorced   Right-handed   Caffeine: soda daily   Social Determinants of Health   Financial Resource Strain:   . Difficulty of Paying Living Expenses:   Food Insecurity:   . Worried About Charity fundraiser in the Last Year:   . Arboriculturist in the Last Year:   Transportation Needs:   . Film/video editor (Medical):   Marland Kitchen Lack of Transportation (Non-Medical):   Physical Activity:   . Days of Exercise per Week:   . Minutes of Exercise per Session:   Stress:   . Feeling of Stress :   Social Connections:   . Frequency of Communication with Friends and Family:   . Frequency of Social Gatherings with Friends and Family:   . Attends Religious Services:   . Active Member of Clubs or Organizations:   . Attends Archivist Meetings:   Marland Kitchen Marital Status:      Review of Systems: A 12 point ROS discussed and pertinent positives are indicated in the HPI above.  All other systems are negative.  Review of Systems  Constitutional: Negative for activity change, fatigue and fever.  HENT: Negative for sore throat, tinnitus, trouble swallowing and voice change.   Eyes: Negative for photophobia and visual disturbance.  Respiratory: Negative for cough and shortness of breath.   Cardiovascular: Negative for chest pain.  Gastrointestinal: Negative for abdominal pain, nausea and  vomiting.  Musculoskeletal: Positive for gait problem. Negative for back pain, neck pain and neck stiffness.  Neurological: Positive for speech difficulty. Negative for dizziness, tremors, seizures, syncope, facial asymmetry, weakness, light-headedness, numbness and headaches.  Psychiatric/Behavioral: Negative for behavioral problems, confusion and decreased concentration.    Vital Signs: BP (!) 176/90   Pulse 78   Temp 98.4 F (36.9 C)   Resp 18   Ht 5\' 8"  (1.727 m)   Wt 160 lb (72.6 kg)   SpO2 97%   BMI 24.33 kg/m   Physical Exam Vitals reviewed.  HENT:     Mouth/Throat:     Mouth: Mucous membranes are moist.  Eyes:     Extraocular Movements: Extraocular movements intact.  Cardiovascular:     Rate and Rhythm: Normal rate and regular rhythm.     Heart sounds: Normal heart sounds.  Pulmonary:     Effort: Pulmonary effort is normal.     Breath sounds: Normal  breath sounds. No wheezing.  Abdominal:     Palpations: Abdomen is soft.     Tenderness: There is no abdominal tenderness.  Musculoskeletal:        General: Normal range of motion.     Cervical back: Normal range of motion.     Right lower leg: No edema.     Left lower leg: No edema.  Skin:    General: Skin is warm.  Neurological:     Mental Status: He is alert and oriented to person, place, and time.  Psychiatric:        Mood and Affect: Mood normal.        Behavior: Behavior normal.        Thought Content: Thought content normal.        Judgment: Judgment normal.     Imaging: No results found.  Labs:  CBC: Recent Labs    05/15/19 0640 06/08/19 0549 07/20/19 0544 07/21/19 0525  WBC 8.9 7.0 7.8 7.7  HGB 14.5 13.9 16.3 13.5  HCT 42.4 42.2 50.0 41.4  PLT 225 193 196 139*    COAGS: Recent Labs    05/05/19 0825 06/08/19 0549 07/20/19 0544 04/20/20 0726  INR 1.1 1.2 1.1 1.1  APTT 34 35  --   --     BMP: Recent Labs    05/14/19 1501 05/14/19 1501 06/08/19 0549 07/20/19 0544  07/21/19 0525 02/23/20 1444  NA 139  --  140 138 137  --   K 3.5  --  3.5 3.8 3.8  --   CL 107  --  112* 107 108  --   CO2 24  --  21* 20* 19*  --   GLUCOSE 97  --  103* 102* 90  --   BUN 16  --  15 17 10   --   CALCIUM 8.9  --  8.7* 9.2 8.2*  --   CREATININE 1.40*   < > 1.14 1.09 0.97 1.00  GFRNONAA 52*  --  >60 >60 >60  --   GFRAA >60  --  >60 >60 >60  --    < > = values in this interval not displayed.    LIVER FUNCTION TESTS: Recent Labs    05/05/19 0825 05/14/19 1501  BILITOT 0.8 0.8  AST 19 37  ALT 20 44  ALKPHOS 74 79  PROT 8.0 6.0*  ALBUMIN 4.7 3.4*    TUMOR MARKERS: No results for input(s): AFPTM, CEA, CA199, CHROMGRNA in the last 8760 hours.  Assessment and Plan:  Right internal carotid artery stenosis Scheduled for RICA angioplasty/stent Risks and benefits of cerebral angiogram with intervention were discussed with the patient including, but not limited to bleeding, infection, vascular injury, contrast induced renal failure, stroke or even death.  This interventional procedure involves the use of X-rays and because of the nature of the planned procedure, it is possible that we will have prolonged use of X-ray fluoroscopy.  Potential radiation risks to you include (but are not limited to) the following: - A slightly elevated risk for cancer  several years later in life. This risk is typically less than 0.5% percent. This risk is low in comparison to the normal incidence of human cancer, which is 33% for women and 50% for men according to the [place]. - Radiation induced injury can include skin redness, resembling a rash, tissue breakdown / ulcers and hair loss (which can be temporary or permanent).   The likelihood of either of these occurring  depends on the difficulty of the procedure and whether you are sensitive to radiation due to previous procedures, disease, or genetic conditions.   IF your procedure requires a prolonged use of radiation,  you will be notified and given written instructions for further action.  It is your responsibility to monitor the irradiated area for the 2 weeks following the procedure and to notify your physician if you are concerned that you have suffered a radiation induced injury.    All of the patient's questions were answered, patient is agreeable to proceed. Consent signed and in chart.  Thank you for this interesting consult.  I greatly enjoyed meeting [facility] and look forward to participating in their care.  A copy of this report was sent to the requesting provider on this date.  Pt is aware if intervention Is performed- he will be admitted for 24 hr observation. He is agreeable  Electronically Signed: Lavonia Drafts, PA-C 04/20/2020, 7:53 AM   I spent a total of    25 Minutes in face to face in clinical consultation, greater than 50% of which was counseling/coordinating care for R ICA intervention

## 2020-04-20 NOTE — Anesthesia Procedure Notes (Signed)
Procedure Name: MAC Date/Time: 04/20/2020 9:24 AM Performed by: Jenne Campus, CRNA Pre-anesthesia Checklist: Patient identified, Emergency Drugs available, Suction available, Patient being monitored and Timeout performed Oxygen Delivery Method: Nasal cannula

## 2020-04-20 NOTE — Discharge Instructions (Signed)
Femoral Site Care This sheet gives you information about how to care for yourself after your procedure. Your health care provider may also give you more specific instructions. If you have problems or questions, contact your health care provider. What can I expect after the procedure?  After the procedure, it is common to have:  Bruising that usually fades within 1-2 weeks.  Tenderness at the site. Follow these instructions at home: Wound care 1. Follow instructions from your health care provider about how to take care of your insertion site. Make sure you: ? Wash your hands with soap and water before you change your bandage (dressing). If soap and water are not available, use hand sanitizer. ? Remove your dressing as told by your health care provider. In 24 hours 2. Do not take baths, swim, or use a hot tub until your health care provider approves. 3. You may shower 24-48 hours after the procedure or as told by your health care provider. ? Gently wash the site with plain soap and water. ? Pat the area dry with a clean towel. ? Do not rub the site. This may cause bleeding. 4. Do not apply powder or lotion to the site. Keep the site clean and dry. 5. Check your femoral site every day for signs of infection. Check for: ? Redness, swelling, or pain. ? Fluid or blood. ? Warmth. ? Pus or a bad smell. Activity 1. For the first 2-3 days after your procedure, or as long as directed: ? Avoid climbing stairs as much as possible. ? Do not squat. 2. Do not lift anything that is heavier than 10 lb (4.5 kg), or the limit that you are told, until your health care provider says that it is safe. For 5 days 3. Rest as directed. ? Avoid sitting for a long time without moving. Get up to take short walks every 1-2 hours. 4. Do not drive for 24 hours if you were given a medicine to help you relax (sedative). General instructions  Take over-the-counter and prescription medicines only as told by your  health care provider.  Keep all follow-up visits as told by your health care provider. This is important. Contact a health care provider if you have:  A fever or chills.  You have redness, swelling, or pain around your insertion site. Get help right away if:  The catheter insertion area swells very fast.  You pass out.  You suddenly start to sweat or your skin gets clammy.  The catheter insertion area is bleeding, and the bleeding does not stop when you hold steady pressure on the area.  The area near or just beyond the catheter insertion site becomes pale, cool, tingly, or numb. These symptoms may represent a serious problem that is an emergency. Do not wait to see if the symptoms will go away. Get medical help right away. Call your local emergency services (911 in the [place] Do not drive yourself to the hospital. Summary  After the procedure, it is common to have bruising that usually fades within 1-2 weeks.  Check your femoral site every day for signs of infection.  Do not lift anything that is heavier than 10 lb (4.5 kg), or the limit that you are told, until your health care provider says that it is safe. This information is not intended to replace advice given to you by your health care provider. Make sure you discuss any questions you have with your health care provider. Document Revised: 12/09/2017 Document Reviewed: 12/09/2017   Elsevier Patient Education  2020 Elsevier Inc[employer]   

## 2020-04-20 NOTE — Progress Notes (Signed)
Lab work appropriate. Dr. Estanislado Pandy ordered to not give any pre-op meds. [employer], CRNA took pt's belongings to [employer] [employer] with pt.   Jacqlyn Larsen, RN

## 2020-04-20 NOTE — Sedation Documentation (Signed)
Right femoral artery sheath removed. Manual pressure being held a right groin site by IR tech.

## 2020-04-20 NOTE — Transfer of Care (Signed)
Immediate Anesthesia Transfer of Care Note  Patient: Jose Kaufman  Procedure(s) Performed: RADIOLOGY WITH ANESTHESIA STENTING (N/A )  Patient Location: PACU  Anesthesia Type:MAC  Level of Consciousness: awake, oriented and patient cooperative  Airway & Oxygen Therapy: Patient Spontanous Breathing and Patient connected to nasal cannula oxygen  Post-op Assessment: Report given to RN and Post -op Vital signs reviewed and stable  Post vital signs: Reviewed  Last Vitals:  Vitals Value Taken Time  BP 150/81 04/20/20 1045  Temp    Pulse 73 04/20/20 1048  Resp 22 04/20/20 1048  SpO2 99 % 04/20/20 1048  Vitals shown include unvalidated device data.  Last Pain:  Vitals:   04/20/20 0736  PainSc: 0-No pain         Complications: No apparent anesthesia complications

## 2020-05-16 ENCOUNTER — Encounter: Payer: Self-pay | Admitting: Adult Health

## 2020-05-16 ENCOUNTER — Ambulatory Visit (INDEPENDENT_AMBULATORY_CARE_PROVIDER_SITE_OTHER): Payer: Commercial Managed Care - PPO | Admitting: Adult Health

## 2020-05-16 ENCOUNTER — Other Ambulatory Visit: Payer: Self-pay

## 2020-05-16 VITALS — BP 130/80 | HR 84 | Ht 68.0 in | Wt 165.0 lb

## 2020-05-16 DIAGNOSIS — E785 Hyperlipidemia, unspecified: Secondary | ICD-10-CM

## 2020-05-16 DIAGNOSIS — I6503 Occlusion and stenosis of bilateral vertebral arteries: Secondary | ICD-10-CM

## 2020-05-16 DIAGNOSIS — I639 Cerebral infarction, unspecified: Secondary | ICD-10-CM | POA: Diagnosis not present

## 2020-05-16 DIAGNOSIS — I10 Essential (primary) hypertension: Secondary | ICD-10-CM

## 2020-05-16 DIAGNOSIS — I6523 Occlusion and stenosis of bilateral carotid arteries: Secondary | ICD-10-CM

## 2020-05-16 NOTE — Patient Instructions (Signed)
Continue aspirin 81 mg daily and Brilinta (ticagrelor) 90 mg bid  and atorvastatin for secondary stroke prevention  Continue to follow up with PCP regarding cholesterol and blood pressure management   Continue to follow with Dr. Estanislado Pandy as well as ongoing use of Brilinta  Continue to monitor blood pressure at home  Maintain strict control of hypertension with blood pressure goal below 130/90, diabetes with hemoglobin A1c goal below 6.5% and cholesterol with LDL cholesterol (bad cholesterol) goal below 70 mg/dL. I also advised the patient to eat a healthy diet with plenty of whole grains, cereals, fruits and vegetables, exercise regularly and maintain ideal body weight.  Followup in the future with me in 6 months or call earlier if needed       Thank you for coming to see [place] at [facility]. I hope we have been able to provide you high quality care today.  You may receive a patient satisfaction survey over the next few weeks. We would appreciate your feedback and comments so that we may continue to improve ourselves and the health of our patients.

## 2020-05-16 NOTE — Progress Notes (Signed)
[facility] [address] [place]. [name] [address] [phone]       OFFICE FOLLOW-UP NOTE  Mr. Naji Mehringer Date of Birth:  Jun 12, 1951 Medical Record Number:  [id number]    Chief complaint: Chief Complaint  Patient presents with  . Follow-up    Rm 9 here for a f/u on a stroke. Pt is having no new sx      HPI:   Today, 05/16/2020, Mr. Albro returns for stroke follow-up.  He has been doing well without new or reoccurring stroke/TIA symptoms.  Continues to have occasional word finding difficulties and occasional imbalance but overall stable. He underwent cerebral angiogram by Dr. Estanislado Pandy on 04/20/2020 for history of left ICA occlusion, severe bilateral vertebral artery origin arthrosclerotic related stenosis s/p stenting and severe stenosis of right ICA proximally with string sign.  Results relatively stable and he is asymptomatic, plan on ongoing conservative management with ongoing use of DAPT (aspirin and Brilinta) and repeat CTA 6 months post procedure. Remains on aspirin 81 mg daily and Brilinta without bleeding or bruising.  Continues on atorvastatin 80 mg daily without myalgias.  Blood pressure today 130/80.  He has not had any repeat lipid panel since hospitalization 1 year ago but does plan on having yearly follow-up with PCP in the near future.  No concerns at this time.    History provided for reference purposes only Update 11/11/2019 Dr. Leonie Man : He returns for follow-up after last visit 4 months ago.  He states he is doing well and has not had recurrent stroke or TIA symptoms.  He underwent elective left vertebral artery origin angioplasty stenting by Dr. Estanislado Pandy on 07/20/2019 which went uneventfully.  He has had a follow-up ultrasound study done by Dr. Estanislado Pandy on 10/21/2019 which shows high-grade stenosis with string sign right ICA and chronic occlusion of the left ICA.  Both vertebral arteries have antegrade flow.  He remains on aspirin and Brilinta  which is tolerating well with minor bruising but no bleeding episodes.  He is also on Lipitor which is tolerating well without muscle aches and pains.  Patient canceled having some dental work including root canal but states is now an emergency and may told him to wait at least till 6 months from the stent so that [place] can be is stopped prior to the procedure.  He voiced understanding.  He has no new complaints today.  Initial visit 06/24/2019 Dr. Leonie Man: Mr. Marciano is a 89 Caucasian male seen today for initial office follow-up visit following hospital admission for stroke in May 2020.  He is accompanied by his son.  History is obtained from them, review of electronic medical records and have personally reviewed imaging films in PACS.  He presented to [facility] long emergency room on 05/05/2019 with sudden onset of aphasia.  His symptoms actually began a few days prior with difficulty in speech comprehension and word finding.  His coworkers noted something was wrong and advised him to go to the emergency room.  He states actually had somewhat similar symptoms a year prior but did not seek medical attention.  CT scan of the head showed no acute abnormality but MRI scan showed old strokes bilaterally involving frontal and parietal regions with new acute cortical infarct in the left frontoparietal region.  CT angiogram showed left ICA occlusion with right ICA string sign.  4 vessel diagnostic cerebral catheter angiogram was performed which confirmed chronic left ICA occlusion with near occlusive stenosis of proximal right ICA and bilateral vertebral  artery origin stenosis.  2D echo showed ejection fraction of 60 to 65% with no wall motion abnormality.  Hemoglobin A1c was 5.7.  LDL cholesterol was elevated at 113 mg percent.  Patient was started on Lipitor 40 mg daily as well as aspirin and Plavix.  He underwent elective right vertebral origin angioplasty stenting on 05/14/2024 with Dr. Estanislado Pandy uneventfully.  He return  for right ICA angioplasty stenting on 06/08/2019 with unfortunately the procedure had to be aborted on the table because he started developing hematuria requiring urgent urological consultation.  Patient is rescheduled to undergo right carotid angioplasty stenting on 06/29/2019 by Dr. the vascular.  Patient states overall is doing much better.  He still has some occasional word finding difficulties when he tries to speak quickly or gets tired.  He is has finished home physical occupational and speech therapy and is currently participating in outpatient therapies.  He remains on Lipitor which is tolerating well without muscle aches and pains.  His blood pressure is well controlled and today it is 141/87.    ROS:   14 system review of systems is positive for gait difficulty, decreased hearing, speech and word finding difficulties all other systems negative   PMH:  Past Medical History:  Diagnosis Date  . Abnormality of gait 10/15/2016  . Arthritis   . Dysrhythmia    hx of  . Essential hypertension 03/14/2017  . PVCs (premature ventricular contractions) 03/14/2017  . Skin cancer    Skin - basal  . Stroke ([place])    Mini strokes . 06/25/2019  Expressive aphasia - improving  . Wears glasses     Social History:  Social History   Socioeconomic History  . Marital status: Divorced    Spouse name: Not on file  . Number of children: 2  . Years of education: 8  . Highest education level: Not on file  Occupational History  . Occupation: [place] [employer]  Tobacco Use  . Smoking status: Former Smoker    Types: Cigarettes    Quit date: 05/20/1992    Years since quitting: 28.0  . Smokeless tobacco: Never Used  Substance and Sexual Activity  . Alcohol use: Not Currently  . Drug use: No  . Sexual activity: Yes  Other Topics Concern  . Not on file  Social History Narrative   Pt lives at home, divorced   Right-handed   Caffeine: soda daily   Social Determinants of Health   Financial Resource  Strain:   . Difficulty of Paying Living Expenses:   Food Insecurity:   . Worried About Charity fundraiser in the Last Year:   . Arboriculturist in the Last Year:   Transportation Needs:   . Film/video editor (Medical):   Marland Kitchen Lack of Transportation (Non-Medical):   Physical Activity:   . Days of Exercise per Week:   . Minutes of Exercise per Session:   Stress:   . Feeling of Stress :   Social Connections:   . Frequency of Communication with Friends and Family:   . Frequency of Social Gatherings with Friends and Family:   . Attends Religious Services:   . Active Member of Clubs or Organizations:   . Attends Archivist Meetings:   Marland Kitchen Marital Status:   Intimate Partner Violence:   . Fear of Current or Ex-Partner:   . Emotionally Abused:   Marland Kitchen Physically Abused:   . Sexually Abused:     Medications:   Current Outpatient Medications on  File Prior to Visit  Medication Sig Dispense Refill  . aspirin EC 81 MG tablet Take 81 mg by mouth daily.    Marland Kitchen atorvastatin (LIPITOR) 80 MG tablet Take 1 tablet (80 mg total) by mouth daily at 6 PM. 30 tablet 1  . ticagrelor (BRILINTA) 60 MG TABS tablet Take 60 mg by mouth 2 (two) times daily.     No current facility-administered medications on file prior to visit.    Allergies:   Allergies  Allergen Reactions  . Penicillins Other (See Comments)    Childhood reaction. Has patient had a PCN reaction causing immediate rash, facial/tongue/throat swelling, SOB or lightheadedness with hypotension: unsure Has patient had a PCN reaction causing severe rash involving mucus membranes or skin necrosis: unsure Has patient had a PCN reaction that required hospitalization:No Has patient had a PCN reaction occurring within the last 10 years:No If all of the above answers are "NO", then may proceed with Cephalosporin use    Physical Exam Today's Vitals   05/16/20 1415  BP: 130/80  Pulse: 84  Weight: 165 lb (74.8 kg)  Height: 5\' 8"  (1.727 m)    Body mass index is 25.09 kg/m.   General: frail middle-aged pleasant Caucasian male, seated, in no evident distress Head: head normocephalic and atraumatic.  Neck: supple with soft right carotid   bruit Cardiovascular: regular rate and rhythm, no murmurs Musculoskeletal: no deformity Skin:  no rash/petichiae Vascular:  Normal pulses all extremities  Neurologic Exam Mental Status: Awake and fully alert.  Occasional word hesitancy.  Oriented to place and time. Recent and remote memory intact. Attention span, concentration and fund of knowledge appropriate. Mood and affect appropriate.  Cranial Nerves: Pupils equal, briskly reactive to light. Extraocular movements full without nystagmus. Visual fields full to confrontation. Hearing intact. Facial sensation intact. Face, tongue, palate moves normally and symmetrically.  Motor: Normal bulk and tone. Normal strength in all tested extremity muscles. Sensory.: intact to touch ,pinprick .position and vibratory sensation.  Coordination: Rapid alternating movements normal in all extremities. Finger-to-nose and heel-to-shin performed accurately bilaterally. Gait and Station: Arises from chair without difficulty. Stance is normal. Gait demonstrates slow cautious steps and mild imbalance with use of cane.  Tandem walk not attempted. Reflexes: 1+ and symmetric. Toes downgoing.       ASSESSMENT: 69 year old Caucasian male with acute left frontoparietal infarcts in May 2020 likely watershed mechanism given history of chronic left ICA occlusion, near occlusive extracranial right carotid stenosis and severe right vertebral origin stenosis.  He has subsequently had elective right vertebral angioplasty done by Dr Lexine Baton on 05/15/2019 and elective right carotid angioplasty procedure on 06/08/2019 which had to be aborted due to patient developing hematuria on the table.  Underwent cerebral angiogram on 04/20/2020 showing stable appearance and recommended ongoing  conservative management as he remains asymptomatic.  Residual deficits of occasional word finding difficulty and mild imbalance but overall stable.    PLAN: -Continue aspirin 81 mg  and Brillinta daily and atorvastatin for secondary stroke prevention  -ongoing use of Brilinta not indicated from a stroke standpoint but is recommended by NIR and request ongoing duration recommendations by NIR -Continue to follow with Dr. Genevie Cheshire with repeat imaging around 10/2020 for surveillance monitoring -Continue to follow with PCP for HTN and HLD management as well as ongoing prescribing of atorvastatin.  -Request repeat lipid panel in the near future with PCP to ensure satisfactory management (patient not currently fasting at today's visit) -maintain strict control of hypertension with blood  pressure goal below 130/90, diabetes with hemoglobin A1c goal below 6.5% and lipids with LDL cholesterol goal below 70 mg/dL. I also advised the patient to eat a healthy diet with plenty of whole grains, cereals, fruits and vegetables, exercise regularly and maintain ideal body weight   Follow-up in 6 months or call earlier if needed  I spent 25 minutes of face-to-face and non-face-to-face time with patient.  This included previsit chart review, lab review, study review, order entry, electronic health record documentation, patient education  Frann Rider, [facility]  [facility] [address] [place], [place] [address]  Phone [phone] Fax [phone] Note: This document was prepared with digital dictation and possible smart phrase technology. Any transcriptional errors that result from this process are unintentional.

## 2020-05-17 NOTE — Progress Notes (Signed)
I agree with the above plan 

## 2020-11-16 ENCOUNTER — Ambulatory Visit: Payer: Medicare Other | Admitting: Adult Health

## 2020-12-07 ENCOUNTER — Ambulatory Visit: Payer: Self-pay

## 2020-12-07 ENCOUNTER — Ambulatory Visit (INDEPENDENT_AMBULATORY_CARE_PROVIDER_SITE_OTHER): Payer: Commercial Managed Care - PPO | Admitting: Orthopaedic Surgery

## 2020-12-07 DIAGNOSIS — M25551 Pain in right hip: Secondary | ICD-10-CM | POA: Diagnosis not present

## 2020-12-07 DIAGNOSIS — I6523 Occlusion and stenosis of bilateral carotid arteries: Secondary | ICD-10-CM

## 2020-12-07 DIAGNOSIS — M545 Low back pain, unspecified: Secondary | ICD-10-CM | POA: Diagnosis not present

## 2020-12-07 NOTE — Progress Notes (Signed)
Office Visit Note   Patient: Jose Kaufman           Date of Birth: 04/11/1951           MRN: [id number] Visit Date: 12/07/2020              Requested by: Sigmund Hazel, MD [address] [place],  [place] [address] PCP: Sigmund Hazel, MD   Assessment & Plan: Visit Diagnoses:  1. Low back pain, unspecified back pain laterality, unspecified chronicity, unspecified whether sciatica present   2. Pain in right hip     Plan: At this point I do feel that he needs to reach out to his neurologist as soon as possible due to his balance and coordination issues so they can take a look at them from a neurologic standpoint.  I would like to at least obtain an MRI of the lumbar spine to rule out nerve compression given his acute weakness in his balance and coronation issues.  I gave him reassurance that his hip replacements look fine but I am certainly concerned about his inability to ambulate.  He uses walker at home and I think this is reasonable to continue to use this since he is probably a fall risk as well.  We will work on getting the MRI set up and ordered and he needs to see his neurologist soon as well.  All question concerns were answered and addressed.  Follow-Up Instructions: No follow-ups on file.   Orders:  Orders Placed This Encounter  Procedures  . XR HIP UNILAT W OR W/O PELVIS 1V RIGHT  . XR Lumbar Spine 2-3 Views   No orders of the defined types were placed in this encounter.     Procedures: No procedures performed   Clinical Data: No additional findings.   Subjective: Chief Complaint  Patient presents with  . Right Hip - Pain  The patient is seen today somewhat as a work in.  He is 69 years old and have actually replaced both his hips.  However he comes in with decreased mobility that is become more abrupt since Thanksgiving.  He says he is got to the point where he can barely even walk.  He denies any pain.  He does have a history of a stroke last year.  Even today  in the exam room he says he cannot really stand up and walk.  He denies any recent injuries.  He says this is come up more or less all of a sudden.  He reports issues with his balance and coordination.  He denies any change in bladder or bowel function.  He denies any numbness and tingling in his legs.  HPI  Review of Systems He currently denies a headache, chest pain, shortness of breath, fever, chills, nausea, vomiting  Objective: Vital Signs: There were no vitals taken for this visit.  Physical Exam He is alert and oriented x3 and does not appear in any acute distress Ortho Exam Examination of both his hips show the move smoothly and fluidly with just some tightness.  Both knees are normal on exam.  He has good strength in both his hips as well as thighs knees legs ankles and feet with no gross deformities.  Again I could not get him to stand or try to walk.  Isolated muscle groups show good strength in his lower extremities. Specialty Comments:  No specialty comments available.  Imaging: XR HIP UNILAT W OR W/O PELVIS 1V RIGHT  Result Date:  12/07/2020 An AP pelvis and lateral right hip shows bilateral hip arthroplasties with no complicating features at all.  XR Lumbar Spine 2-3 Views  Result Date: 12/07/2020 2 views of the lumbar spine shows severe degenerative changes between L4-L5 and L5-S1.    PMFS History: Patient Active Problem List   Diagnosis Date Noted  . Vertebral artery stenosis 07/21/2019  . Vertebral artery narrowing, left 07/20/2019  . Non-fluent aphasia   . Acute bilateral cerebral infarction in a watershed distribution [facility]) 05/14/2019  . Vertebral artery narrowing, right 05/12/2019  . Hyperlipidemia 05/06/2019  . CVA (cerebral vascular accident) (HCC) 05/05/2019  . Internal carotid artery occlusion, bilateral   . COVID-19 virus not detected   . Status post total replacement of left hip 08/23/2017  . Unilateral primary osteoarthritis, left hip 05/02/2017   . Status post total replacement of right hip 03/22/2017  . PVCs (premature ventricular contractions) 03/14/2017  . Essential hypertension 03/14/2017  . Unilateral primary osteoarthritis, right hip 03/08/2017  . Bilateral hip pain 10/15/2016  . Abnormality of gait 10/15/2016   Past Medical History:  Diagnosis Date  . Abnormality of gait 10/15/2016  . Arthritis   . Dysrhythmia    hx of  . Essential hypertension 03/14/2017  . PVCs (premature ventricular contractions) 03/14/2017  . Skin cancer    Skin - basal  . Stroke ([facility])    Mini strokes . 06/25/2019  Expressive aphasia - improving  . Wears glasses     Family History  Problem Relation Age of Onset  . Leukemia Mother   . Pneumonia Father     Past Surgical History:  Procedure Laterality Date  . COLONOSCOPY    . IR ANGIO INTRA EXTRACRAN SEL COM CAROTID INNOMINATE BILAT MOD SED  05/07/2019  . IR ANGIO INTRA EXTRACRAN SEL COM CAROTID INNOMINATE BILAT MOD SED  07/20/2019  . IR ANGIO INTRA EXTRACRAN SEL COM CAROTID INNOMINATE UNI R MOD SED  04/20/2020  . IR ANGIO VERTEBRAL SEL SUBCLAVIAN INNOMINATE BILAT MOD SED  05/07/2019  . IR ANGIO VERTEBRAL SEL SUBCLAVIAN INNOMINATE UNI R MOD SED  07/20/2019  . IR ANGIO VERTEBRAL SEL SUBCLAVIAN INNOMINATE UNI R MOD SED  04/20/2020  . IR ANGIO VERTEBRAL SEL VERTEBRAL UNI L MOD SED  04/20/2020  . IR PATIENT EVAL TECH 0-60 MINS  06/08/2019  . IR TRANSCATH EXCRAN VERT OR CAR A STENT  05/12/2019  . IR TRANSCATH EXCRAN VERT OR CAR A STENT  07/20/2019  . IR US GUIDE VASC ACCESS RIGHT  05/12/2019  . RADIOLOGY WITH ANESTHESIA N/A 05/08/2019   Procedure: RADIOLOGY WITH ANESTHESIA;  Surgeon: Julieanne Cotton, MD;  Location: [facility] OR;  Service: Radiology;  Laterality: N/A;  . RADIOLOGY WITH ANESTHESIA N/A 05/12/2019   Procedure: carotid stenting;  Surgeon: Julieanne Cotton, MD;  Location: [facility] OR;  Service: Radiology;  Laterality: N/A;  . RADIOLOGY WITH ANESTHESIA N/A 06/08/2019   Procedure: STENT PLACEMENT;  Surgeon:  Julieanne Cotton, MD;  Location: [facility] OR;  Service: Radiology;  Laterality: N/A;  . RADIOLOGY WITH ANESTHESIA N/A 07/20/2019   Procedure: RADIOLOGY WITH ANESTHESIA STENTING;  Surgeon: Julieanne Cotton, MD;  Location: [facility] OR;  Service: Radiology;  Laterality: N/A;  . RADIOLOGY WITH ANESTHESIA N/A 04/20/2020   Procedure: RADIOLOGY WITH ANESTHESIA STENTING;  Surgeon: Julieanne Cotton, MD;  Location: [facility] OR;  Service: Radiology;  Laterality: N/A;  . SKIN CANCER EXCISION    . TOTAL HIP ARTHROPLASTY Right 03/22/2017   Procedure: RIGHT TOTAL HIP ARTHROPLASTY ANTERIOR APPROACH;  Surgeon: Kathryne Hitch, MD;  Location: [facility] ORS;  Service: Orthopedics;  Laterality: Right;  . TOTAL HIP ARTHROPLASTY Left 08/23/2017   Procedure: LEFT TOTAL HIP ARTHROPLASTY ANTERIOR APPROACH;  Surgeon: Mcarthur Rossetti, MD;  Location: [facility] ORS;  Service: Orthopedics;  Laterality: Left;  . WISDOM TOOTH EXTRACTION     Social History   Occupational History  . Occupation: [place] [employer]  Tobacco Use  . Smoking status: Former Smoker    Types: Cigarettes    Quit date: 05/20/1992    Years since quitting: 28.5  . Smokeless tobacco: Never Used  Vaping Use  . Vaping Use: Never used  Substance and Sexual Activity  . Alcohol use: Not Currently  . Drug use: No  . Sexual activity: Yes

## 2020-12-08 ENCOUNTER — Telehealth: Payer: Self-pay | Admitting: Neurology

## 2020-12-08 ENCOUNTER — Other Ambulatory Visit: Payer: Self-pay

## 2020-12-08 DIAGNOSIS — M545 Low back pain, unspecified: Secondary | ICD-10-CM

## 2020-12-08 NOTE — Telephone Encounter (Signed)
Pt. Called today to let Dr. Pearlean Brownie know that he was diagnosed with arthritis  In his back and he has an MRI scheduled Tue. 12/13/20.

## 2020-12-08 NOTE — Telephone Encounter (Signed)
Thanks for letting me know.  I will follow up on the MRI results

## 2020-12-13 ENCOUNTER — Telehealth: Payer: Self-pay | Admitting: Orthopaedic Surgery

## 2020-12-13 ENCOUNTER — Ambulatory Visit
Admission: RE | Admit: 2020-12-13 | Discharge: 2020-12-13 | Disposition: A | Discharge status: Home / Self Care | Payer: Commercial Managed Care - PPO | Source: Ambulatory Visit | Attending: Orthopaedic Surgery | Admitting: Orthopaedic Surgery

## 2020-12-13 ENCOUNTER — Other Ambulatory Visit: Payer: Self-pay

## 2020-12-13 DIAGNOSIS — M545 Low back pain, unspecified: Secondary | ICD-10-CM

## 2020-12-13 IMAGING — MR MR LUMBAR SPINE W/O CM
4 of 5 series · 26 of 48 positions shown · non-contrast
Comparison: [DATE] and prior.

CLINICAL DATA: Low back pain, cauda equina syndrome suspected Low
back pain, progressive neurologic deficit possible nerve
compression, not able to bear weight

EXAM:
MRI LUMBAR SPINE WITHOUT CONTRAST
TECHNIQUE: Multiplanar, multisequence MR imaging of the lumbar spine was
performed. No intravenous contrast was administered.

[Series 3: T2 · sagittal · 4.0mm · 1.09mm/px · 6 of 17 slices shown (1 of 2)]
[im 1/17]
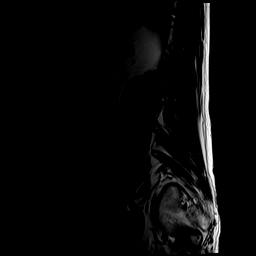
[im 4/17]
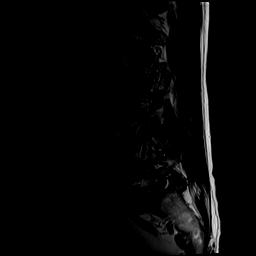
[im 7/17]
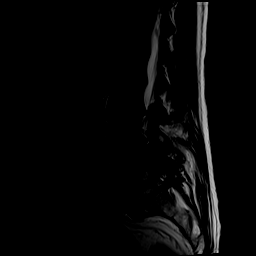
[im 10/17]
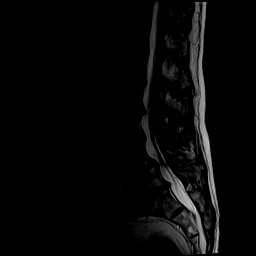
[im 13/17]
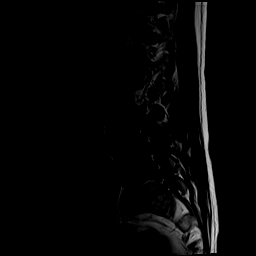
[im 17/17]
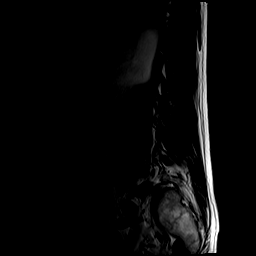

[Series 5: T1 · sagittal · 4.0mm · 1.09mm/px · 6 of 17 slices shown (1 of 2)]
[im 1/17]
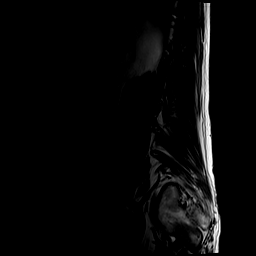
[im 4/17]
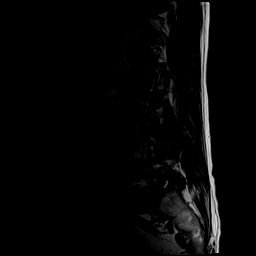
[im 7/17]
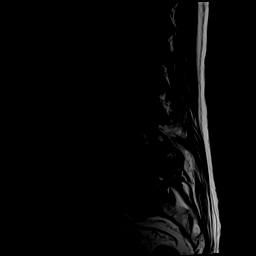
[im 10/17]
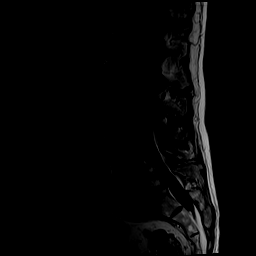
[im 13/17]
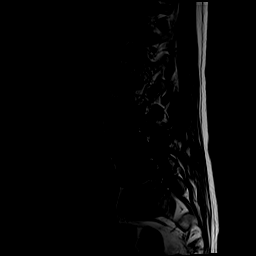
[im 17/17]
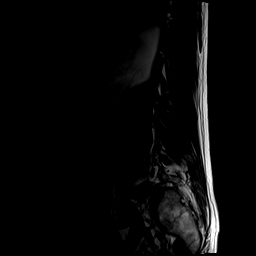

[Series 6: T2 · axial · 4.0mm · 0.39mm/px · z∈[-90,+113]mm · 9 of 41 slices shown (2 of 2)]
[im 1/41]
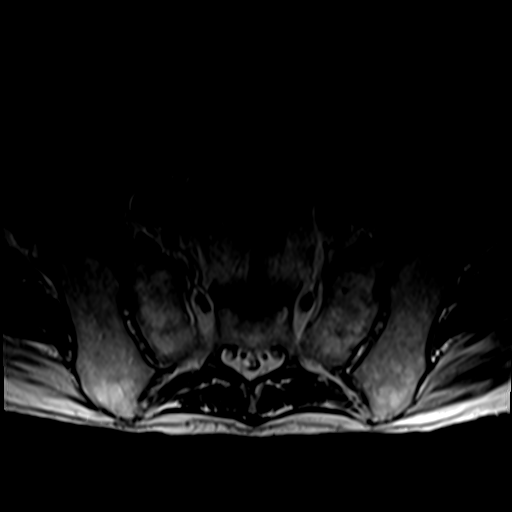
[im 6/41]
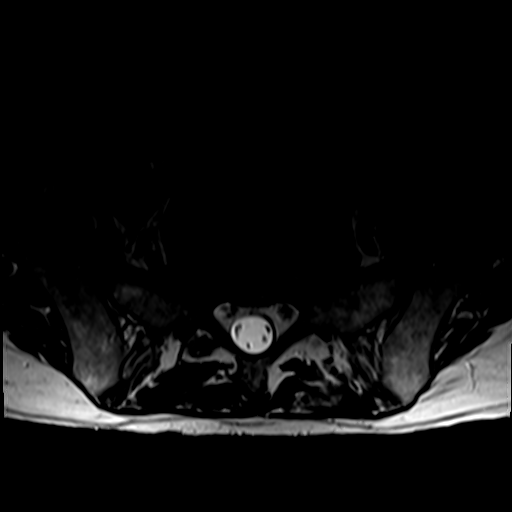
[im 12/41]
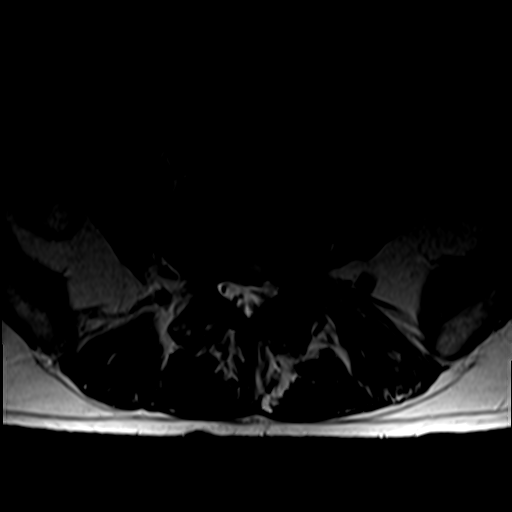
[im 18/41]
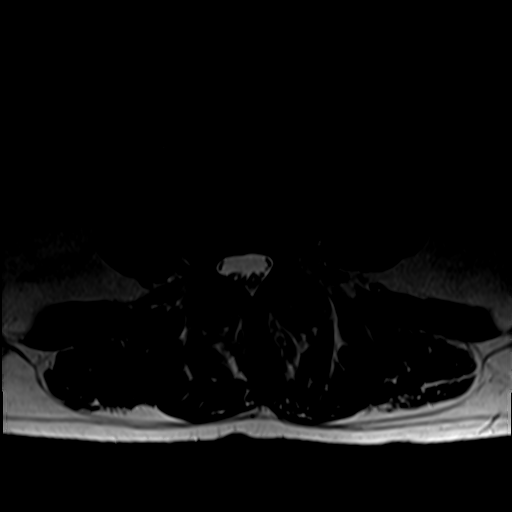
[im 21/41]
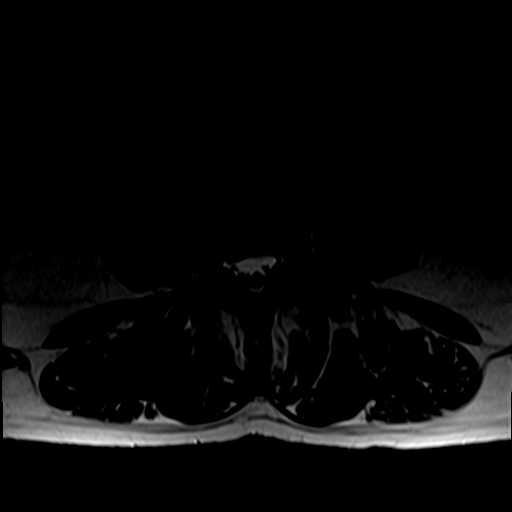
[im 23/41]
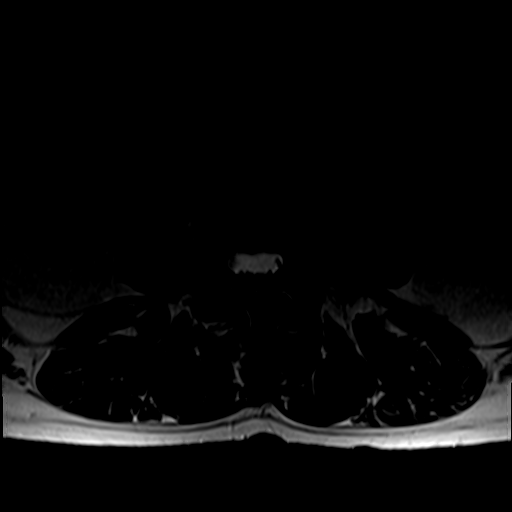
[im 29/41]
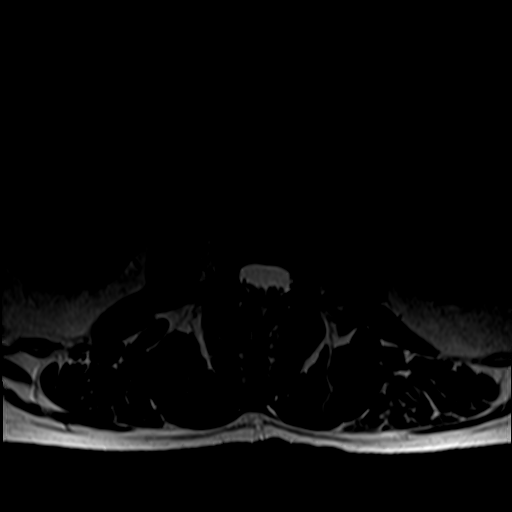
[im 35/41]
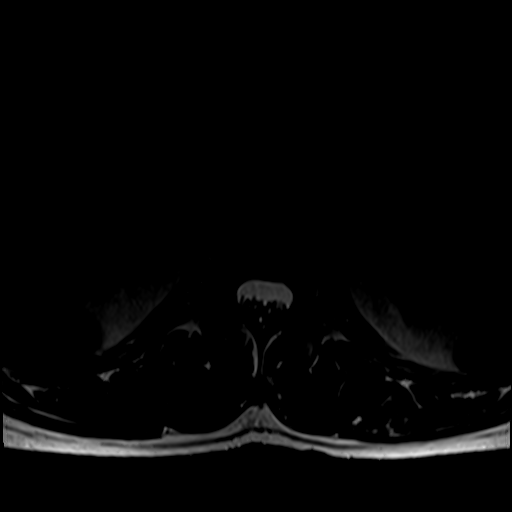
[im 41/41]
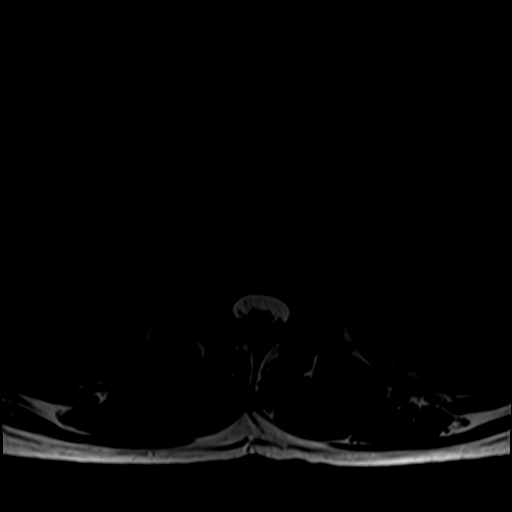

[Series 7: T1 · axial · 4.0mm · 0.39mm/px · z∈[-90,+85]mm · 5 of 41 slices shown (2 of 2)]
[im 1/41]
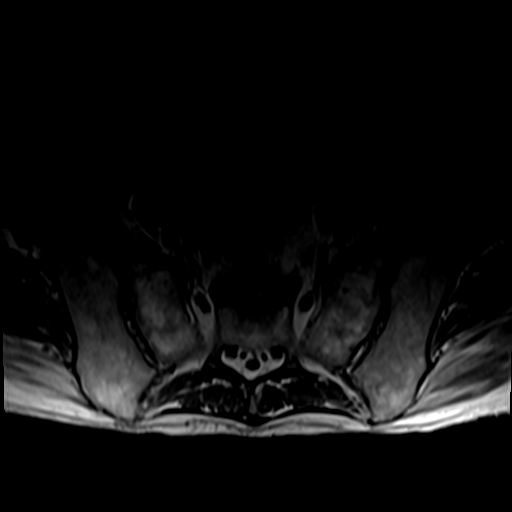
[im 6/41]
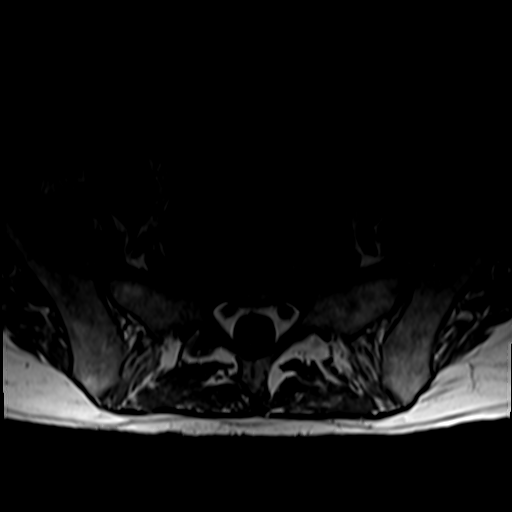
[im 12/41]
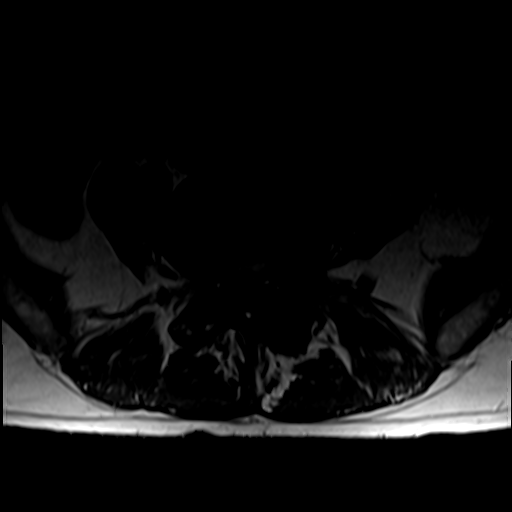
[im 21/41]
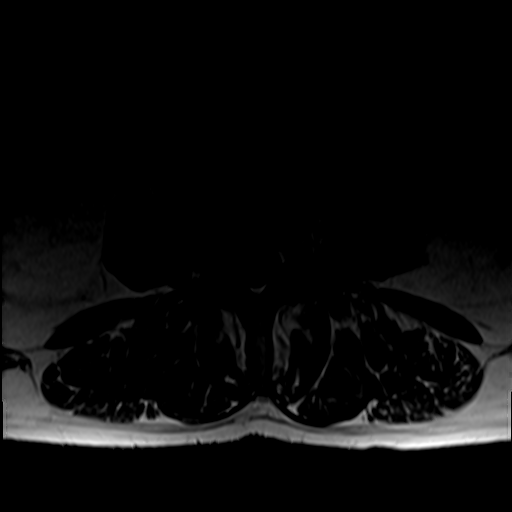
[im 35/41]
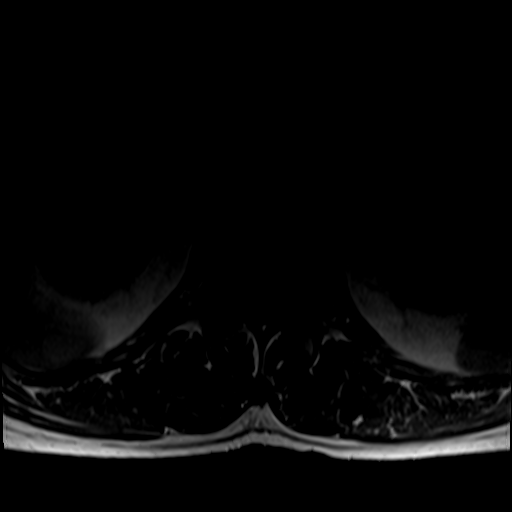

[26 of 48 positions shown; findings below may reference images not displayed]

FINDINGS: Segmentation:  Standard.

Alignment: Grade 1 L2-3 retrolisthesis. Grade 1 L4-5
anterolisthesis.

Vertebrae: Vertebral body heights are preserved. Multilevel Modic
type 2 endplate degenerative changes most prominent at the L5-S1
level. No focal osseous lesion.

Conus medullaris and cauda equina: Conus extends to the L1 level.
Conus and cauda equina appear normal.

Disc levels: Multilevel desiccation and disc space loss.

T12-L1: No significant disc bulge. Bilateral facet degenerative
spurring. Patent spinal canal and neural foramen.

L1-2: Minimal disc bulge and bilateral facet degenerative spurring.
Mild right neural foraminal narrowing. Patent spinal canal and left
neural foramen.

L2-3: Disc bulge with superimposed right extraforaminal
protrusion/annular fissuring. Bilateral facet degenerative spurring.
Mild spinal canal, severe left and moderate right neural foraminal
narrowing.

L3-4: Mild disc bulge and bilateral facet hypertrophy. Prominent
ligamentum flavum. Grazing of the exiting left L3 and descending L4
nerve roots. Mild spinal canal and bilateral neural foraminal
narrowing.

L4-5: Disc bulge with superimposed left paracentral
protrusion/annular fissuring. Prominent left anterolateral
protrusion with annular fissuring. Ligamentum flavum thickening and
bilateral facet hypertrophy. Severe spinal canal, mild right and
severe left neural foraminal narrowing.

L5-S1: Disc bulge with right subarticular annular fissuring.
Bilateral facet hypertrophy. Abutment of the descending S1 nerve
roots. Mild spinal canal and moderate bilateral neural foraminal
narrowing.

Paraspinal and other soft tissues: Mild left paraspinal edema at the
level of the lumbosacral junction ([DATE]).
IMPRESSION: Multilevel spondylosis. Severe spinal canal, mild right and severe
left neural foraminal narrowing at the L4-5 level.

Severe left and moderate right neural foraminal narrowing at the
L2-3 level.

Moderate bilateral L5-S1 neural foraminal narrowing.

## 2020-12-13 NOTE — Telephone Encounter (Signed)
Patient called. He would like to know if he should have his MRI sent to [facility]? His call back number is [phone]

## 2020-12-14 NOTE — Telephone Encounter (Signed)
He should have them review it

## 2020-12-14 NOTE — Telephone Encounter (Signed)
LMOM for patient stating they should be able to see scan at [facility]

## 2020-12-19 ENCOUNTER — Telehealth: Payer: Self-pay | Admitting: *Deleted

## 2020-12-19 NOTE — Telephone Encounter (Signed)
Pt called for test results. Please call [phone]

## 2020-12-19 NOTE — Telephone Encounter (Signed)
Pt. called to see if we received his MRI results from [facility]. Please advise.

## 2020-12-19 NOTE — Telephone Encounter (Signed)
I returned the patient's call and reviewed MRI scan of the lumbar spine images which show he has severe spinal stenosis at L4-5 as well as at levels higher and lower than that but to a lesser extent.  Patient can barely walk without a walker and is functionally quite limited.  I think if there is a chance that back surgery is going to help him walk better I think he needs to go for it.  I advised him to contact Dr. Ninfa Linden for the next steps

## 2020-12-20 ENCOUNTER — Other Ambulatory Visit: Payer: Self-pay

## 2020-12-20 ENCOUNTER — Telehealth: Payer: Self-pay | Admitting: Orthopaedic Surgery

## 2020-12-20 DIAGNOSIS — M545 Low back pain, unspecified: Secondary | ICD-10-CM

## 2020-12-20 NOTE — Telephone Encounter (Signed)
This patient needs a referral to neurosurgery to any other physicians at [facility] as soon as possible to evaluate severe lumbar stenosis with progressive bilateral lower extremity weakness.

## 2020-12-20 NOTE — Telephone Encounter (Signed)
Sent referral 

## 2020-12-20 NOTE — Telephone Encounter (Signed)
Patient aware Jose Kaufman doesn't do surgery on the spine and that we sent a referral to [facility] yesterday

## 2020-12-20 NOTE — Telephone Encounter (Signed)
Pt would like to know if Dr.Blackman would be able to do his back surgery; he states he has arthritis in his lower back and he would like to get Dr.Blackmans opinion on it.  [phone]

## 2020-12-22 ENCOUNTER — Telehealth: Payer: Self-pay | Admitting: Orthopaedic Surgery

## 2020-12-22 NOTE — Telephone Encounter (Signed)
Pt called and said Dr.Blackman was going to refe him to a surgeon for his back surgery and no one has called him. He would like a phone a call please [phone]

## 2020-12-22 NOTE — Telephone Encounter (Signed)
Can you tell if we have sent his info to [facility] yet? Since it was an urgent referral?

## 2020-12-22 NOTE — Telephone Encounter (Signed)
It was submitted thru [facility] to [facility]

## 2021-07-10 ENCOUNTER — Telehealth: Payer: Self-pay | Admitting: Student

## 2021-07-10 NOTE — Telephone Encounter (Addendum)
Received a call from [place] regarding possibility of d/c Brilinta prior to procedure at [facility].   Patient is s/p bilateral VA stent placement in 2020 with Dr. Sallyanne Havers.  Discussed with Dr. Estanislado Pandy, Kary Kos can be held up to 5 days but patient to be remain on ASA 81 mg qd.   Called [facility] and left a VM on physician's line with a call back number for further questions.   Also sent a staff message to [facility] staff to contact Mr. Frappier to schedule f/u CTA Head and Neck asap as patient was supposed to schedule the f/u CTA in November 2021 which he never did.   Will follow up once CTA head & neck is done.   Armando Gang Rie Mcneil PA-C 07/10/2021 12:15 PM

## 2021-08-28 ENCOUNTER — Other Ambulatory Visit (HOSPITAL_COMMUNITY): Payer: Self-pay | Admitting: Interventional Radiology

## 2021-08-28 ENCOUNTER — Telehealth (HOSPITAL_COMMUNITY): Payer: Self-pay

## 2021-08-28 DIAGNOSIS — I6502 Occlusion and stenosis of left vertebral artery: Secondary | ICD-10-CM

## 2021-08-28 NOTE — Telephone Encounter (Signed)
Called to schedule cta head/neck, no answer, left vm. AW 

## 2021-09-25 ENCOUNTER — Telehealth: Payer: Self-pay | Admitting: Adult Health

## 2021-09-25 NOTE — Telephone Encounter (Signed)
Pt called, [facility] faxed over surgical clearance for back surgery. Would like a call from the nurse.

## 2021-09-25 NOTE — Telephone Encounter (Signed)
Contact pt back, stated it was just faxed to [place] so advised him to give [place] a few minutes to receive it and we will contact him once its completed.

## 2021-09-26 NOTE — Telephone Encounter (Signed)
Form received, placed on jessica's desk for review and signature.

## 2021-09-26 NOTE — Telephone Encounter (Signed)
Patient not seen in over a year and recommended 66-month follow-up. Per review of epic, IR placed phone note stating Brilinta can be held for 5 days prior to procedure but will need to remain on aspirin. If further clearance is needed from stroke standpoint (not specifically just for medication recommendations) then a follow up visit will need to be scheduled. Thank you

## 2021-09-27 NOTE — Telephone Encounter (Signed)
Jose Kaufman got pt scheduled 10/10/21

## 2021-09-27 NOTE — Telephone Encounter (Signed)
Clearance placed back on jessica's desk

## 2021-09-27 NOTE — Telephone Encounter (Signed)
Contacted pt, LVM requesting call back.

## 2021-09-27 NOTE — Telephone Encounter (Signed)
Pt returned call. Please call back when available. 

## 2021-10-10 ENCOUNTER — Encounter: Payer: Self-pay | Admitting: Adult Health

## 2021-10-10 ENCOUNTER — Ambulatory Visit (INDEPENDENT_AMBULATORY_CARE_PROVIDER_SITE_OTHER): Payer: Medicare Other | Admitting: Adult Health

## 2021-10-10 ENCOUNTER — Other Ambulatory Visit: Payer: Self-pay

## 2021-10-10 VITALS — BP 127/89 | HR 86 | Ht 68.0 in | Wt 153.0 lb

## 2021-10-10 DIAGNOSIS — I639 Cerebral infarction, unspecified: Secondary | ICD-10-CM

## 2021-10-10 DIAGNOSIS — Z01818 Encounter for other preprocedural examination: Secondary | ICD-10-CM

## 2021-10-10 NOTE — Patient Instructions (Addendum)
No changes today. You are cleared for back surgery with small but acceptable risk of recurrent stroke while off therapy. Please ensure you resume aspirin immediately after or once stable

## 2021-10-10 NOTE — Progress Notes (Signed)
[facility] [address] [place]. [name] [address] [phone]       OFFICE FOLLOW-UP NOTE  Mr. Jose Kaufman Date of Birth:  November 10, 1951 Medical Record Number:  [id number]    Primary neurologist: Dr. Leonie Man Reason for visit: Surgical clearance w/ stroke hx   Chief complaint: Chief Complaint  Patient presents with   Follow-up    Rm 2 alone Pt is well and stable, here for surg. Clearance       HPI:   Update 10/10/2021 JM: returns for surgical clearance to undergo lumbar TLIF.  Previously seen 05/16/2020 for stroke follow-up  Stable from stroke standpoint since prior visit. Denies new or reoccurring stroke/TIA symptoms.  No residual stroke deficits.  Remains on aspirin and atorvastatin 80mg  daily without side effects. He also remains on Brilinta per IR recommendations.  Blood pressure today 127/89. He is currently using a RW due to low back pain which has been greatly impacting ambulation and activity.  No further concerns at this time.    History provided for reference purposes only Update 6/7/2021JM: Mr. [name] returns for stroke follow-up.  He has been doing well without new or reoccurring stroke/TIA symptoms.  Continues to have occasional word finding difficulties and occasional imbalance but overall stable. He underwent cerebral angiogram by Dr. Estanislado Pandy on 04/20/2020 for history of left ICA occlusion, severe bilateral vertebral artery origin arthrosclerotic related stenosis s/p stenting and severe stenosis of right ICA proximally with string sign.  Results relatively stable and he is asymptomatic, plan on ongoing conservative management with ongoing use of DAPT (aspirin and Brilinta) and repeat CTA 6 months post procedure. Remains on aspirin 81 mg daily and Brilinta without bleeding or bruising.  Continues on atorvastatin 80 mg daily without myalgias.  Blood pressure today 130/80.  He has not had any repeat lipid panel since hospitalization 1 year ago but does  plan on having yearly follow-up with PCP in the near future.  No concerns at this time.   Update 11/11/2019 Dr. Leonie Man : He returns for follow-up after last visit 4 months ago.  He states he is doing well and has not had recurrent stroke or TIA symptoms.  He underwent elective left vertebral artery origin angioplasty stenting by Dr. Estanislado Pandy on 07/20/2019 which went uneventfully.  He has had a follow-up ultrasound study done by Dr. Estanislado Pandy on 10/21/2019 which shows high-grade stenosis with string sign right ICA and chronic occlusion of the left ICA.  Both vertebral arteries have antegrade flow.  He remains on aspirin and Brilinta which is tolerating well with minor bruising but no bleeding episodes.  He is also on Lipitor which is tolerating well without muscle aches and pains.  Patient canceled having some dental work including root canal but states is now an emergency and may told him to wait at least till 6 months from the stent so that Rentz can be is stopped prior to the procedure.  He voiced understanding.  He has no new complaints today.  Initial visit 06/24/2019 Dr. Leonie Man: Mr. [name] is a 57 Caucasian male seen today for initial office follow-up visit following hospital admission for stroke in May 2020.  He is accompanied by his son.  History is obtained from them, review of electronic medical records and have personally reviewed imaging films in PACS.  He presented to [facility] long emergency room on 05/05/2019 with sudden onset of aphasia.  His symptoms actually began a few days prior with difficulty in speech comprehension and word finding.  His  coworkers noted something was wrong and advised him to go to the emergency room.  He states actually had somewhat similar symptoms a year prior but did not seek medical attention.  CT scan of the head showed no acute abnormality but MRI scan showed old strokes bilaterally involving frontal and parietal regions with new acute cortical infarct in the left  frontoparietal region.  CT angiogram showed left ICA occlusion with right ICA string sign.  4 vessel diagnostic cerebral catheter angiogram was performed which confirmed chronic left ICA occlusion with near occlusive stenosis of proximal right ICA and bilateral vertebral artery origin stenosis.  2D echo showed ejection fraction of 60 to 65% with no wall motion abnormality.  Hemoglobin A1c was 5.7.  LDL cholesterol was elevated at 113 mg percent.  Patient was started on Lipitor 40 mg daily as well as aspirin and Plavix.  He underwent elective right vertebral origin angioplasty stenting on 05/14/2024 with Dr. Estanislado Pandy uneventfully.  He return for right ICA angioplasty stenting on 06/08/2019 with unfortunately the procedure had to be aborted on the table because he started developing hematuria requiring urgent urological consultation.  Patient is rescheduled to undergo right carotid angioplasty stenting on 06/29/2019 by Dr. the vascular.  Patient states overall is doing much better.  He still has some occasional word finding difficulties when he tries to speak quickly or gets tired.  He is has finished home physical occupational and speech therapy and is currently participating in outpatient therapies.  He remains on Lipitor which is tolerating well without muscle aches and pains.  His blood pressure is well controlled and today it is 141/87.    ROS:   14 system review of systems is positive for those listed in HPI and all other systems negative   PMH:  Past Medical History:  Diagnosis Date   Abnormality of gait 10/15/2016   Arthritis    Dysrhythmia    hx of   Essential hypertension 03/14/2017   PVCs (premature ventricular contractions) 03/14/2017   Skin cancer    Skin - basal   Stroke ([place])    Mini strokes . 06/25/2019  Expressive aphasia - improving   Wears glasses     Social History:  Social History   Socioeconomic History   Marital status: Divorced    Spouse name: Not on file   Number of  children: 2   Years of education: 12   Highest education level: Not on file  Occupational History   Occupation: [employer]  Tobacco Use   Smoking status: Former    Types: Cigarettes    Quit date: 05/20/1992    Years since quitting: 29.4   Smokeless tobacco: Never  Vaping Use   Vaping Use: Never used  Substance and Sexual Activity   Alcohol use: Not Currently   Drug use: No   Sexual activity: Yes  Other Topics Concern   Not on file  Social History Narrative   Pt lives at home, divorced   Right-handed   Caffeine: soda daily   Social Determinants of Health   Financial Resource Strain: Not on file  Food Insecurity: Not on file  Transportation Needs: Not on file  Physical Activity: Not on file  Stress: Not on file  Social Connections: Not on file  Intimate Partner Violence: Not on file    Medications:   Current Outpatient Medications on File Prior to Visit  Medication Sig Dispense Refill   aspirin EC 81 MG tablet Take 81 mg by mouth daily.  atorvastatin (LIPITOR) 80 MG tablet Take 1 tablet (80 mg total) by mouth daily at 6 PM. 30 tablet 1   ticagrelor (BRILINTA) 60 MG TABS tablet Take 60 mg by mouth 2 (two) times daily.     No current facility-administered medications on file prior to visit.    Allergies:   Allergies  Allergen Reactions   Penicillins Other (See Comments)    Childhood reaction. Has patient had a PCN reaction causing immediate rash, facial/tongue/throat swelling, SOB or lightheadedness with hypotension: unsure Has patient had a PCN reaction causing severe rash involving mucus membranes or skin necrosis: unsure Has patient had a PCN reaction that required hospitalization:No Has patient had a PCN reaction occurring within the last 10 years:No If all of the above answers are "NO", then may proceed with Cephalosporin use    Physical Exam Today's Vitals   10/10/21 1434  BP: 127/89  Pulse: 86  Weight: 153 lb (69.4 kg)  Height: 5\' 8"  (1.727 m)    Body mass index is 23.26 kg/m.   General: frail middle-aged pleasant Caucasian male, seated, in no evident distress Head: head normocephalic and atraumatic.  Neck: supple with soft right carotid   bruit Cardiovascular: regular rate and rhythm, no murmurs Musculoskeletal: no deformity Skin:  no rash/petichiae Vascular:  Normal pulses all extremities  Neurologic Exam Mental Status: Awake and fully alert.  Occasional speech hesitancy but no evidence of aphasia or dysarthria.  Oriented to place and time. Recent and remote memory intact. Attention span, concentration and fund of knowledge appropriate. Mood and affect appropriate.  Cranial Nerves: Pupils equal, briskly reactive to light. Extraocular movements full without nystagmus. Visual fields full to confrontation. Hearing intact. Facial sensation intact. Face, tongue, palate moves normally and symmetrically.  Motor: Normal bulk and tone. Normal strength in all tested extremity muscles. Sensory.: intact to touch ,pinprick .position and vibratory sensation.  Coordination: Rapid alternating movements normal in all extremities. Finger-to-nose and heel-to-shin performed accurately bilaterally. Gait and Station: Arises from chair without difficulty. Stance is normal. Gait demonstrates slow cautious steps and mild imbalance and use of RW.  Tandem walk not attempted. Reflexes: 1+ and symmetric. Toes downgoing.       ASSESSMENT: 70 year old Caucasian male with hx of left frontoparietal infarcts in May 2020 likely watershed mechanism given history of chronic left ICA occlusion, near occlusive extracranial right carotid stenosis and severe right vertebral origin stenosis.  S/p right vertebral angioplasty on 05/15/2019 and right carotid angioplasty on 06/08/2019 which had to be aborted due to patient developing hematuria on the table.  Underwent cerebral angiogram on 04/20/2020 showing stable appearance and recommended ongoing conservative management  routinely followed by Dr. Estanislado Pandy.  Returns today, 10/10/2021, for surgical clearance of L4-5 TLIF     PLAN: -cleared to undergo procedure from stroke standpoint with holding aspirin 3-5 days prior to procedure with small but acceptable risk of recurrent stroke while off therapy.  Recommend restarting immediately after or once hemodynamically stable. This was discussed with patient who verbalized understanding -Continue aspirin 81 mg and atorvastatin for secondary stroke prevention  -Routine follow-up with Dr. Estanislado Pandy and ongoing use of Brilinta per IR recommendations - he was advised to contact their office as it was recommended to repeat CTA head/neck  -Continue to follow with PCP for HTN and HLD management as well as ongoing prescribing of atorvastatin.  -Request repeat lipid panel in the near future with PCP to ensure satisfactory management (patient not currently fasting at today's visit) -maintain strict control of lipids  with LDL cholesterol goal below 70 mg/dL.  Routinely monitored by PCP per patient     I spent 31 minutes of face-to-face and non-face-to-face time with patient.  This included previsit chart review, lab review, study review, electronic health record documentation, patient education and discussion regarding history of prior stroke and surgical clearance, secondary stroke prevention measures and aggressive stroke risk factor management and answered all other questions to patient's satisfaction  Frann Rider, [facility]  [facility] [address] [place], [place] [address]  Phone [phone] Fax [phone] Note: This document was prepared with digital dictation and possible smart phrase technology. Any transcriptional errors that result from this process are unintentional.

## 2021-10-11 NOTE — Telephone Encounter (Signed)
Clearance signed, faxed back to [phone]. Confirmation received .

## 2022-02-23 ENCOUNTER — Encounter (HOSPITAL_COMMUNITY): Payer: Self-pay | Admitting: Emergency Medicine

## 2022-02-23 ENCOUNTER — Emergency Department (HOSPITAL_COMMUNITY)
Admission: EM | Admit: 2022-02-23 | Discharge: 2022-02-23 | Disposition: A | Discharge status: Home / Self Care | Payer: Medicare Other | Attending: Emergency Medicine | Admitting: Emergency Medicine

## 2022-02-23 ENCOUNTER — Other Ambulatory Visit: Payer: Self-pay

## 2022-02-23 DIAGNOSIS — R55 Syncope and collapse: Secondary | ICD-10-CM | POA: Diagnosis present

## 2022-02-23 DIAGNOSIS — Z7982 Long term (current) use of aspirin: Secondary | ICD-10-CM | POA: Diagnosis not present

## 2022-02-23 LAB — CBC WITH DIFFERENTIAL/PLATELET
Abs Immature Granulocytes: 0.02 10*3/uL (ref 0.00–0.07)
Basophils Absolute: 0 10*3/uL (ref 0.0–0.1)
Basophils Relative: 0 %
Eosinophils Absolute: 0 10*3/uL (ref 0.0–0.5)
Eosinophils Relative: 0 %
HCT: 47.1 % (ref 39.0–52.0)
Hemoglobin: 16 g/dL (ref 13.0–17.0)
Immature Granulocytes: 0 %
Lymphocytes Relative: 10 %
Lymphs Abs: 1 10*3/uL (ref 0.7–4.0)
MCH: 32.3 pg (ref 26.0–34.0)
MCHC: 34 g/dL (ref 30.0–36.0)
MCV: 95 fL (ref 80.0–100.0)
Monocytes Absolute: 0.7 10*3/uL (ref 0.1–1.0)
Monocytes Relative: 7 %
Neutro Abs: 8 10*3/uL — ABNORMAL HIGH (ref 1.7–7.7)
Neutrophils Relative %: 83 %
Platelets: 180 10*3/uL (ref 150–400)
RBC: 4.96 MIL/uL (ref 4.22–5.81)
RDW: 13.1 % (ref 11.5–15.5)
WBC: 9.8 10*3/uL (ref 4.0–10.5)
nRBC: 0 % (ref 0.0–0.2)

## 2022-02-23 LAB — BASIC METABOLIC PANEL
Anion gap: 7 (ref 5–15)
BUN: 24 mg/dL — ABNORMAL HIGH (ref 8–23)
CO2: 26 mmol/L (ref 22–32)
Calcium: 8.6 mg/dL — ABNORMAL LOW (ref 8.9–10.3)
Chloride: 102 mmol/L (ref 98–111)
Creatinine, Ser: 1.15 mg/dL (ref 0.61–1.24)
GFR, Estimated: >60 mL/min (ref >60)
Glucose, Bld: 83 mg/dL (ref 70–99)
Potassium: 3.6 mmol/L (ref 3.5–5.1)
Sodium: 135 mmol/L (ref 135–145)

## 2022-02-23 MED ORDER — SODIUM CHLORIDE 0.9 % IV BOLUS
1000.0000 mL | Freq: Once | INTRAVENOUS | Status: AC
  Administered 2022-02-23: 1000 mL via INTRAVENOUS

## 2022-02-23 NOTE — ED Triage Notes (Signed)
Patient presents from his MD post syncopal episode. While at the MD office BP was 70/50. Patient thought his BP may have been high this morning, so he took his discontinued lisinopril. EMS administered fluid. ? ?HX: TIA ? ?EMS vitals: ?102/66 BP ?70's HR ?116 CBG ?99% SPO2 on room air ?

## 2022-02-23 NOTE — ED Provider Notes (Signed)
?[facility] DEPT ?Provider Note ? ? ?CSN: [id number] ?Arrival date & time: 02/23/22  1156 ? ?  ? ?History ? ?Chief Complaint  ?Patient presents with  ? Loss of Consciousness  ? ? ?Jose Kaufman is a 71 y.o. male. ? ?71 yo M with chief complaints of a syncopal event.  The patient states that he was waiting in line to check in for his spinal doctor and ended up feeling bad and passing out.  He denies any injury in the fall.  He feels much better now.  Denies any chest pain or trouble breathing.  Denies headache or neck pain.  He had been feeling well.  He did take a extra blood pressure medication today because he wanted his blood pressure to be somewhat normal so he would not have his surgery postponed. ? ? ? ?  ? ?Home Medications ?Prior to Admission medications   ?Medication Sig Start Date End Date Taking? Authorizing Provider  ?aspirin EC 81 MG tablet Take 81 mg by mouth daily.    [provider]  ?atorvastatin (LIPITOR) 80 MG tablet Take 1 tablet (80 mg total) by mouth daily at 6 PM. 05/22/19   Love, Ivan Anchors, PA-C  ?ticagrelor (BRILINTA) 60 MG TABS tablet Take 60 mg by mouth 2 (two) times daily.    [provider]  ?   ? ?Allergies    ?Penicillins   ? ?Review of Systems   ?Review of Systems ? ?Physical Exam ?Updated Vital Signs ?BP 124/74 (BP Location: Right Arm)   Pulse 72   Temp (!) 97.5 ?F (36.4 ?C) (Oral)   Resp 12   Ht '5\' 8"'$  (1.727 m)   Wt 72.6 kg   SpO2 100%   BMI 24.33 kg/m?  ?Physical Exam ? ?ED Results / Procedures / Treatments   ?Labs ?(all labs ordered are listed, but only abnormal results are displayed) ?Labs Reviewed  ?CBC WITH DIFFERENTIAL/PLATELET - Abnormal; Notable for the following components:  ?    Result Value  ? Neutro Abs 8.0 (*)   ? All other components within normal limits  ?BASIC METABOLIC PANEL - Abnormal; Notable for the following components:  ? BUN 24 (*)   ? Calcium 8.6 (*)   ? All other components within normal limits   ? ? ?EKG ?EKG Interpretation ? ?Date/Time:  Friday February 23 2022 12:14:13 EDT ?Ventricular Rate:  73 ?PR Interval:  168 ?QRS Duration: 135 ?QT Interval:  415 ?QTC Calculation: 458 ?R Axis:   32 ?Text Interpretation: Sinus rhythm Right bundle branch block no wpw, prolonged qt or brugada Otherwise no significant change Confirmed by Deno Etienne [phone]) on 02/23/2022 12:24:44 PM ? ?Radiology ?No results found. ? ?Procedures ?.1-3 Lead EKG Interpretation ?Performed by: Deno Etienne, DO ?Authorized by: Deno Etienne, DO  ? ?  Interpretation: normal   ?  ECG rate:  72 ?  ECG rate assessment: normal   ?  Rhythm: sinus rhythm   ?  Ectopy: none   ?  Conduction: normal    ? ? ?Medications Ordered in ED ?Medications  ?sodium chloride 0.9 % bolus 1,000 mL (1,000 mLs Intravenous New Bag/Given 02/23/22 1329)  ? ? ?ED Course/ Medical Decision Making/ A&P ?  ?                        ?Medical Decision Making ?Amount and/or Complexity of Data Reviewed ?Labs: ordered. ? ? ?71 yo M with a chief complaints of a  syncopal event.  Vasovagal by history.  He feels much better now.  Denied any chest pain or trouble breathing headaches neck pain and abdominal pain.  He did take a dose of lisinopril today medicine is not normally on so that his blood pressure be closer to normal for his surgeons visit.  We will obtain basic blood evaluation bolus of IV fluids reassess.  EKG without concerning finding for cardiogenic syncope. ? ?CBC without anemia.  No leukocytosis.  No significant electrolyte abnormality.  Patient reassessed and continues to feel well.  Will discharge home. ? ?2:29 PM:  I have discussed the diagnosis/risks/treatment options with the patient.  Evaluation and diagnostic testing in the emergency department does not suggest an emergent condition requiring admission or immediate intervention beyond what has been performed at this time.  They will follow up with  PCP. We also discussed returning to the ED immediately if new or worsening sx  occur. We discussed the sx which are most concerning (e.g., sudden worsening pain, fever, inability to tolerate by mouth) that necessitate immediate return. Medications administered to the patient during their visit and any new prescriptions provided to the patient are listed below. ? ?Medications given during this visit ?Medications  ?sodium chloride 0.9 % bolus 1,000 mL (1,000 mLs Intravenous New Bag/Given 02/23/22 1329)  ? ? ? ?The patient appears reasonably screen and/or stabilized for discharge and I doubt any other medical condition or other [facility] requiring further screening, evaluation, or treatment in the ED at this time prior to discharge.  ? ? ? ? ? ? ? ? ?Final Clinical Impression(s) / ED Diagnoses ?Final diagnoses:  ?Syncope and collapse  ? ? ?Rx / DC Orders ?ED Discharge Orders   ? ? None  ? ?  ? ? ?  ?Deno Etienne, DO ?02/23/22 1429 ? ?

## 2022-02-23 NOTE — Discharge Instructions (Signed)
Eat and drink as well as you can for the next 48 hours.  Follow up with your family doc.   ?

## 2022-03-07 ENCOUNTER — Other Ambulatory Visit: Payer: Self-pay | Admitting: Orthopaedic Surgery

## 2022-03-07 DIAGNOSIS — M48062 Spinal stenosis, lumbar region with neurogenic claudication: Secondary | ICD-10-CM

## 2022-03-19 ENCOUNTER — Ambulatory Visit
Admission: RE | Admit: 2022-03-19 | Discharge: 2022-03-19 | Disposition: A | Discharge status: Home / Self Care | Payer: Medicare Other | Source: Ambulatory Visit | Attending: Orthopaedic Surgery | Admitting: Orthopaedic Surgery

## 2022-03-19 DIAGNOSIS — M48062 Spinal stenosis, lumbar region with neurogenic claudication: Secondary | ICD-10-CM

## 2022-03-19 IMAGING — MR MR LUMBAR SPINE W/O CM
4 of 5 series · 24 of 48 positions shown · non-contrast
Comparison: Lumbar spine MRI [DATE]

CLINICAL DATA: Back and leg pain for 2 years

EXAM:
MRI LUMBAR SPINE WITHOUT CONTRAST
TECHNIQUE: Multiplanar, multisequence MR imaging of the lumbar spine was
performed. No intravenous contrast was administered.

[Series 3: T1 · sagittal · 4.0mm · 0.88mm/px · 5 of 14 slices shown (1 of 2)]
[im 1/14]
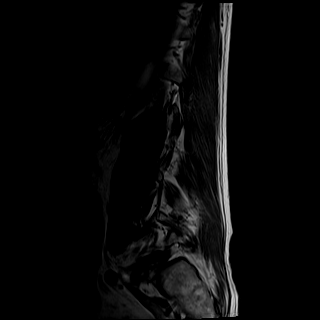
[im 4/14]
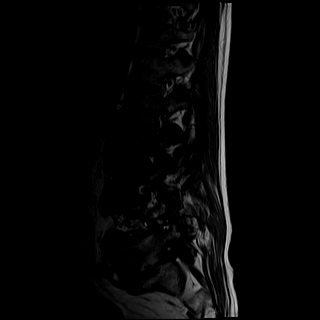
[im 7/14]
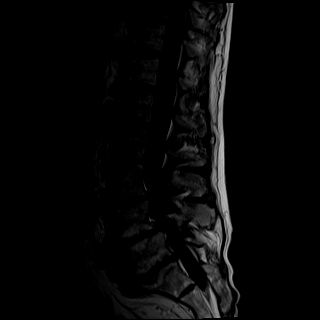
[im 10/14]
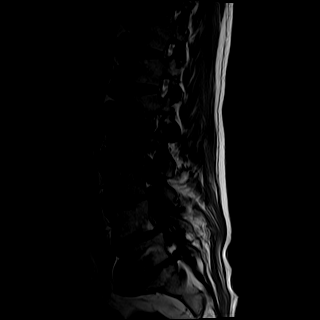
[im 14/14]
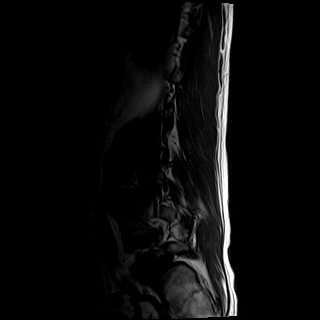

[Series 5: T2 · axial · 4.0mm · 0.39mm/px · z∈[-110,+102]mm · 10 of 40 slices shown (1 of 2)]
[im 3/40]
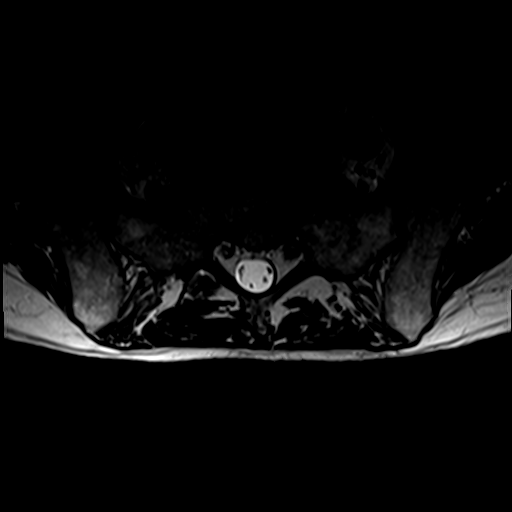
[im 6/40]
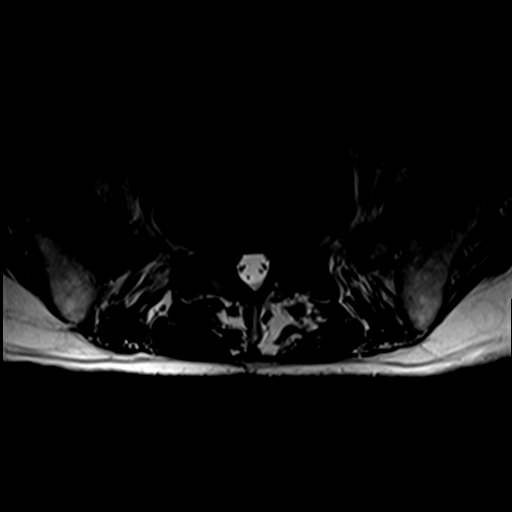
[im 8/40]
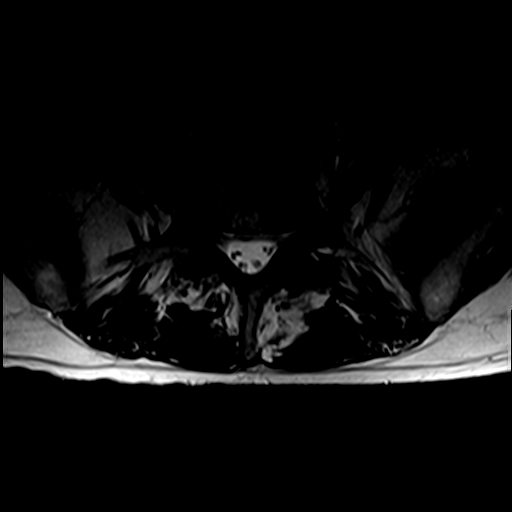
[im 14/40]
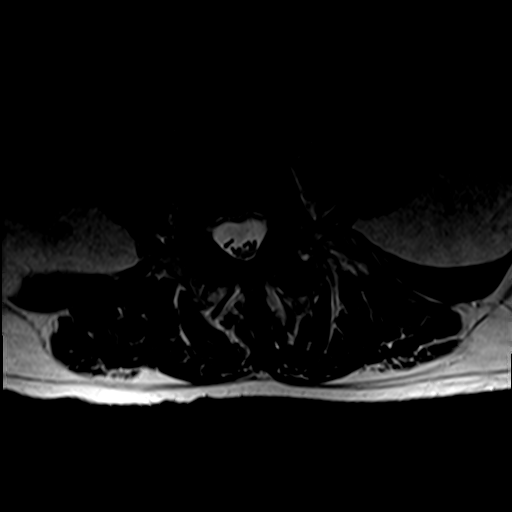
[im 19/40]
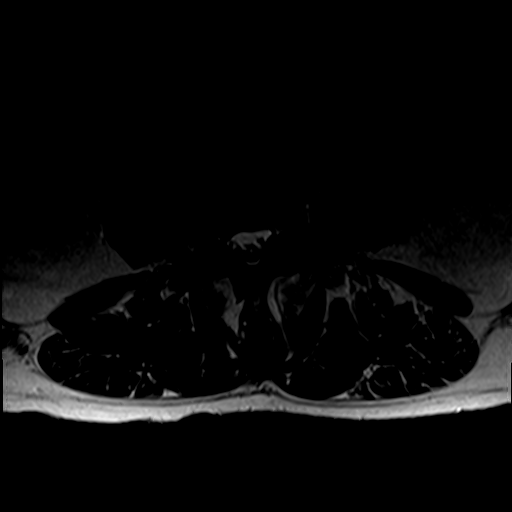
[im 21/40]
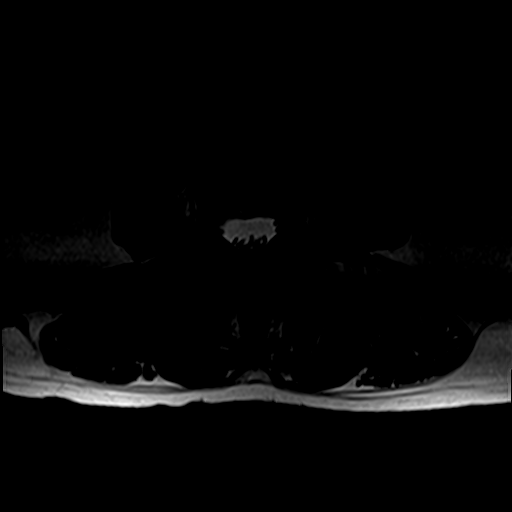
[im 24/40]
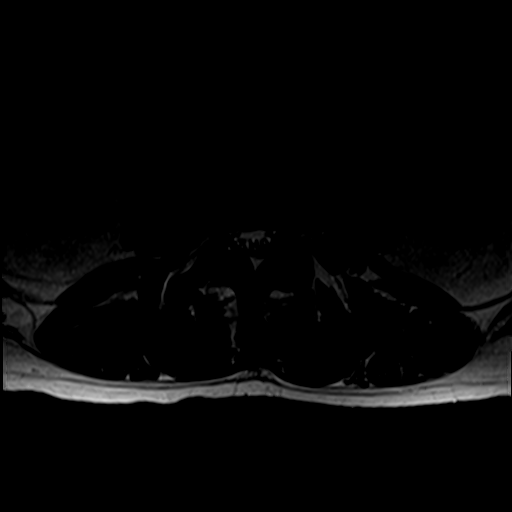
[im 29/40]
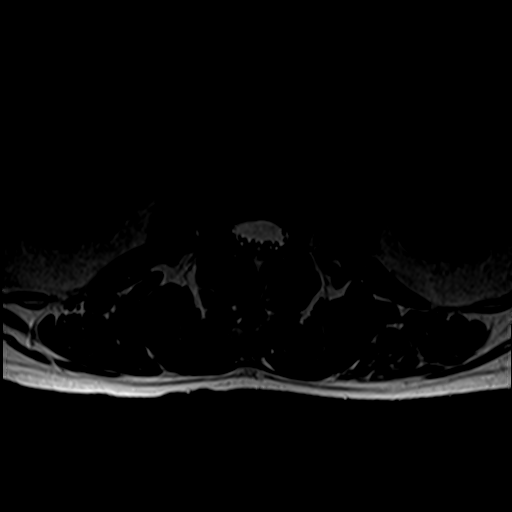
[im 34/40]
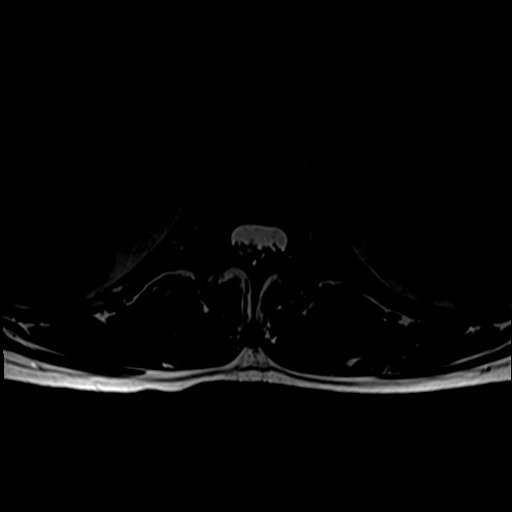
[im 40/40]
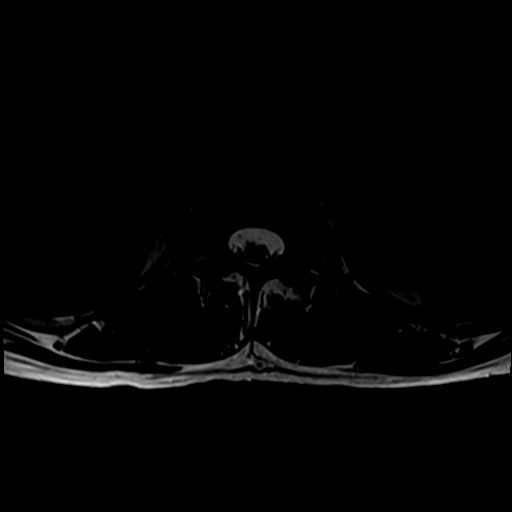

[Series 6: T1 · axial · 4.0mm · 0.39mm/px · z∈[-96,+74]mm · 3 of 40 slices shown (2 of 2)]
[im 6/40]
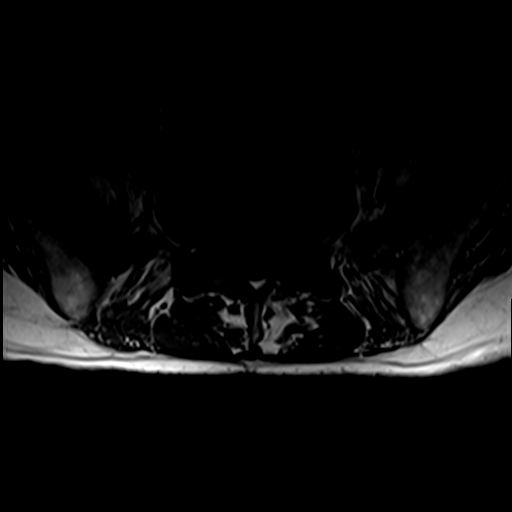
[im 21/40]
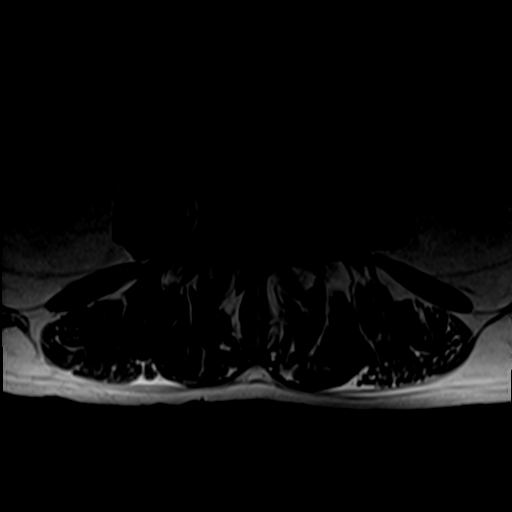
[im 34/40]
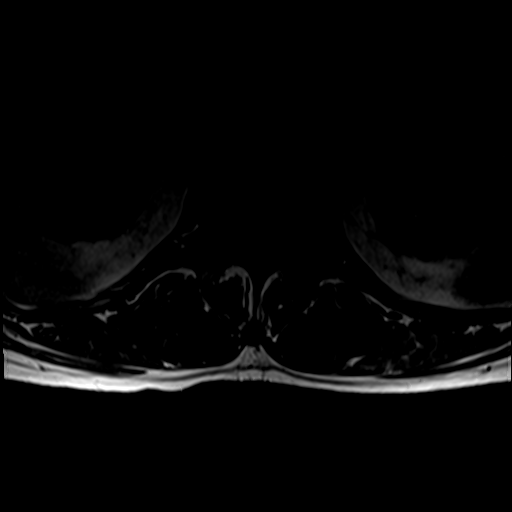

[Series 7: T2 · sagittal · 4.0mm · 1.09mm/px · 6 of 14 slices shown (2 of 2)]
[im 1/14]
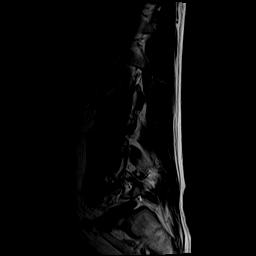
[im 3/14]
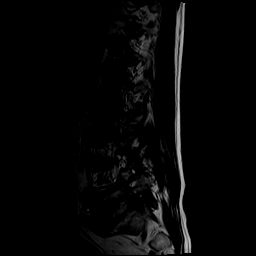
[im 6/14]
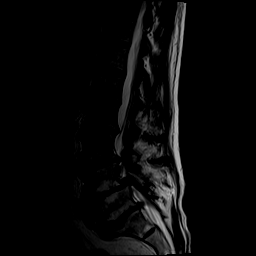
[im 8/14]
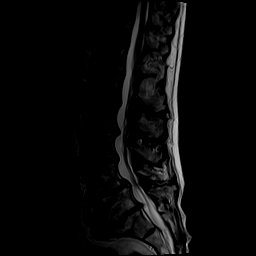
[im 11/14]
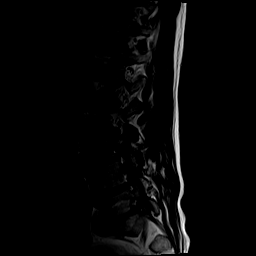
[im 14/14]
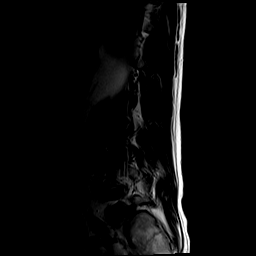

[24 of 48 positions shown; findings below may reference images not displayed]

FINDINGS: Segmentation: Standard; the lowest formed disc space is designated
L5-S1.

Alignment: There is mild S shaped scoliosis of the lumbar spine with
levocurvature centered at L1 and dextrocurvature centered at L4,
similar to the prior study. There is grade 1 retrolisthesis of L2 on
L3 and grade 1 anterolisthesis of L4 on L5, unchanged.

Vertebrae: Vertebral body heights are preserved. There is no
suspicious marrow signal abnormality or marrow edema. There is
degenerative endplate marrow signal abnormality at L4-L5 and L5-S1.
Previously seen endplate edema at L5-S1 has improved.

Conus medullaris and cauda equina: Conus extends to the L1 level.
Conus and cauda equina appear normal.

Paraspinal and other soft tissues: There is perifacetal soft tissue
edema bilaterally at L4-L5. The paraspinal soft tissues are
otherwise unremarkable.

Disc levels:

There is multilevel disc desiccation and narrowing, most advanced at
L4-L5 and L5-S1, not significantly changed.

T12-L1: No significant spinal canal or neural foraminal stenosis

L1-L2: There is a mild disc bulge and mild bilateral facet
arthropathy resulting in mild right and no significant left neural
foraminal stenosis and no significant spinal canal stenosis, not
significantly changed

L2-L3: There is a diffuse disc bulge and bilateral facet arthropathy
with ligamentum flavum thickening resulting in mild spinal canal
stenosis and moderate left and mild right neural foraminal stenosis,
not significantly changed.

L3-L4: There is a diffuse disc bulge, moderate bilateral facet
arthropathy, and ligamentum flavum thickening resulting in mild
spinal canal stenosis with narrowing of the subarticular zones, left
more than right, without evidence of frank nerve root impingement,
and moderate bilateral neural foraminal stenosis, not significantly
changed.

L4-L5: There is grade 1 anterolisthesis with uncovering of the disc
posteriorly and a superimposed left subarticular zone/foraminal
protrusion, advanced bilateral facet arthropathy, and ligamentum
flavum thickening resulting in moderate spinal canal stenosis with
effacement of the left subarticular zone and impingement of the
traversing L4 nerve root, and severe left and moderate right neural
foraminal stenosis, not significantly changed.

L5-S1: There is a diffuse disc bulge with a prominent anterior
protrusion and moderate bilateral facet arthropathy resulting in
narrowing of the bilateral subarticular zones with abutment of the
traversing S1 nerve roots and severe left worse than right neural
foraminal stenosis, not significantly changed.
IMPRESSION: 1. Grade 1 anterolisthesis of L4 on L5 with a diffuse disc bulge and
superimposed left subarticular zone/foraminal protrusion, and
advanced bilateral facet arthropathy resulting in moderate spinal
canal stenosis with impingement of the traversing left L4 nerve root
in the subarticular zone, and severe left and moderate right neural
foraminal stenosis, not significantly changed since [64]. There is
perifacetal soft tissue edema bilaterally at this level which may
reflect a source of pain.
2. Degenerative disc disease at L5-S1 with a diffuse disc bulge and
bilateral facet arthropathy resulting in narrowing of the
subarticular zones with abutment of the traversing S1 nerve roots,
and severe left worse than right neural foraminal stenosis, not
significantly changed.
3. Moderate left and mild right neural foraminal stenosis at L2-L3,
and moderate bilateral neural foraminal stenosis at L3-L4, not
significantly changed.
4. S shaped scoliosis of the lumbar spine, not significantly
changed.

## 2022-05-03 ENCOUNTER — Other Ambulatory Visit: Payer: Self-pay | Admitting: Nurse Practitioner

## 2022-05-22 ENCOUNTER — Encounter (HOSPITAL_COMMUNITY): Payer: Self-pay

## 2022-05-22 ENCOUNTER — Ambulatory Visit (HOSPITAL_COMMUNITY)
Admission: RE | Admit: 2022-05-22 | Discharge: 2022-05-22 | Disposition: A | Discharge status: Home / Self Care | Payer: Medicare Other | Source: Ambulatory Visit | Attending: Interventional Radiology | Admitting: Interventional Radiology

## 2022-05-22 ENCOUNTER — Ambulatory Visit (HOSPITAL_COMMUNITY): Payer: Medicare Other

## 2022-05-22 DIAGNOSIS — I6502 Occlusion and stenosis of left vertebral artery: Secondary | ICD-10-CM | POA: Insufficient documentation

## 2022-05-22 IMAGING — CT CT ANGIO HEAD
1 of 11 series · 5 of 33 positions shown · non-contrast
Comparison: [DATE]

CLINICAL DATA: Vertebral artery narrowing, left

EXAM:
CT ANGIOGRAPHY HEAD AND NECK
TECHNIQUE: Multidetector CT imaging of the head and neck was performed using
the standard protocol during bolus administration of intravenous
contrast. Multiplanar CT image reconstructions and MIPs were
obtained to evaluate the vascular anatomy. Carotid stenosis
measurements (when applicable) are obtained utilizing NASCET
criteria, using the distal internal carotid diameter as the
denominator.

[Series 12: ax thin · axial · 0.42mm/px · z∈[-290,-49]mm · 5 of 363 slices shown]
[im 61/363  soft-tissue]
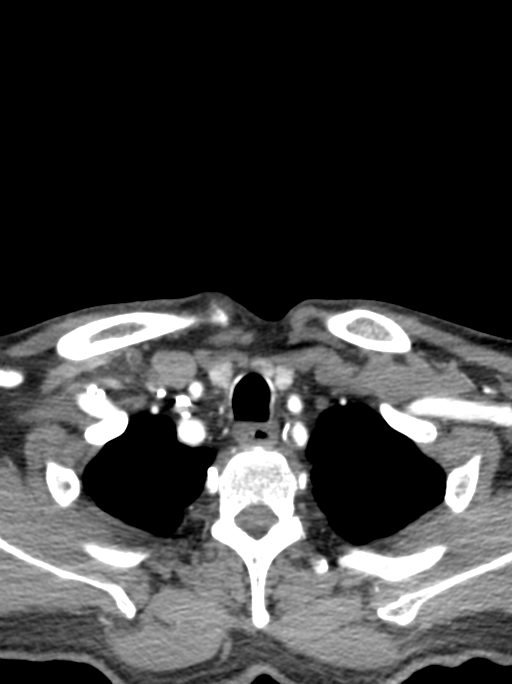
[im 121/363  bone]
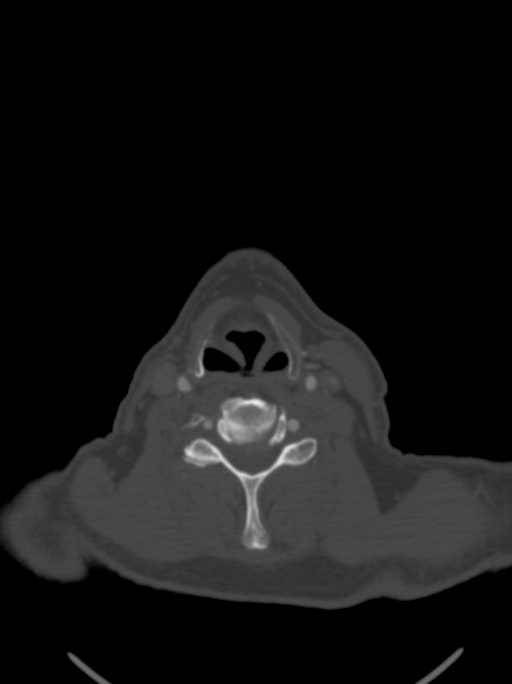
[im 182/363  soft-tissue]
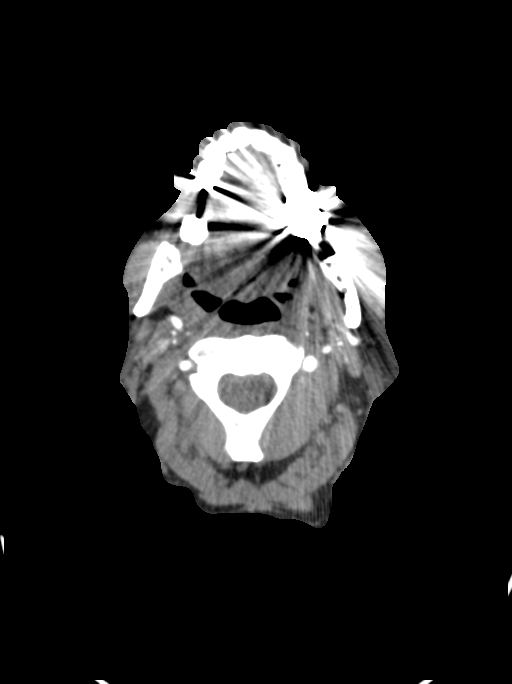
[im 242/363  bone]
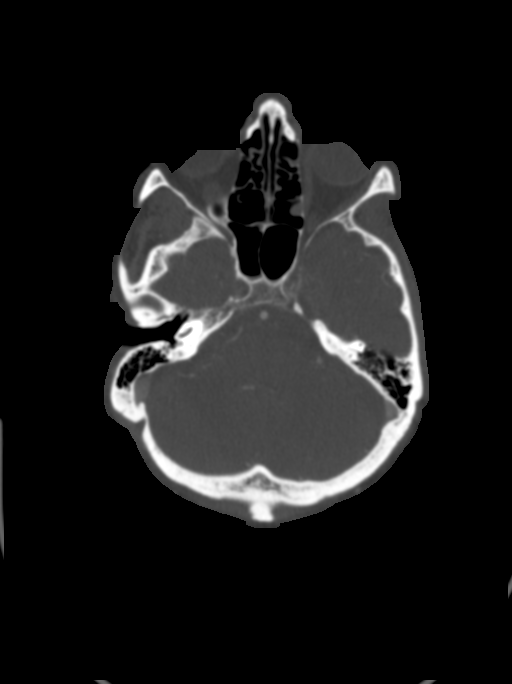
[im 302/363  soft-tissue]
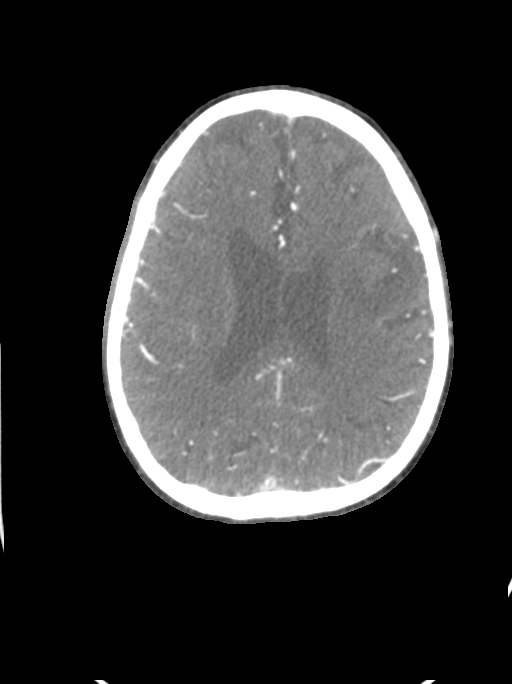

[5 of 33 positions shown; findings below may reference images not displayed]

RADIATION DOSE REDUCTION: This exam was performed according to the
departmental dose-optimization program which includes automated
exposure control, adjustment of the mA and/or kV according to
patient size and/or use of iterative reconstruction technique.

CONTRAST:  75mL OMNIPAQUE IOHEXOL 350 MG/ML SOLN
FINDINGS: CT HEAD

Brain: There is no acute intracranial hemorrhage or mass effect.
Multiple chronic infarcts are again identified primarily involving
bilateral frontoparietal lobes. Additional patchy hypoattenuation in
the supratentorial white matter is nonspecific but may reflect
similar chronic microvascular ischemic changes. Prominence of the
ventricles and sulci reflects similar parenchymal volume loss. There
is no extra-axial collection.

Vascular: Better evaluated on CTA portion.

Skull: Calvarium is unremarkable.

Sinuses/Orbits: Right maxillary sinus retention cyst or polyp.
Orbits are unremarkable.

Other: Mastoid air cells are clear.

Review of the MIP images confirms the above findings

CTA NECK

Aortic arch: Great vessel origins are patent. There is mixed but
primarily noncalcified plaque along the proximal left subclavian
causing stable 50% stenosis. This appears similar.

Right carotid system: Primarily noncalcified plaque along the common
carotid has increased. There is less than 50% stenosis. Marked mixed
but primarily noncalcified plaque at the bifurcation and ICA origin
with visual occlusion. There is a reconstituted string of contrast
extending to the skull base.

Left carotid system: Primarily noncalcified plaque along the common
carotid has slightly increased with less than 50% stenosis. Mixed
but primarily noncalcified plaque at the bifurcation and proximal
internal carotid with occlusion.

Vertebral arteries: Patent. There are stents at the origins.
Approximately 50% stenosis within the right stent.

Skeleton: Degenerative changes of the cervical spine.

Other neck: Unremarkable.

Upper chest: Included upper lungs are clear.

Review of the MIP images confirms the above findings

CTA HEAD

Anterior circulation: Small caliber the intracranial right internal
carotid to the distal cavernous segment where there is
reconstitution. The remainder is patent with stable irregularity.
Minimal enhancement is present within the intracranial left
vertebral artery with small caliber throughout. Anterior and middle
cerebral arteries are patent.

Posterior circulation: Intracranial vertebral arteries are patent.
Basilar artery is patent. Posterior cerebral arteries are patent.

Venous sinuses: Patent as allowed by contrast bolus timing.

Review of the MIP images confirms the above findings
IMPRESSION: No acute intracranial abnormality.

Visually occluded proximal right ICA with subsequent string of
contrast extending to the skull base. Intracranially, there is
similar partial reconstitution at the distal cavernous portion.

Occluded left cervical ICA with similar partial reconstitution
intracranially.

Unchanged approximately 50% stenosis of the proximal left subclavian
artery. Patent stents at the vertebral artery origins. There is
approximately 50% in-stent stenosis on the right.

## 2022-05-22 IMAGING — CT CT ANGIO NECK
1 of 11 series · 5 of 33 positions shown · non-contrast
Comparison: [DATE]

CLINICAL DATA: Vertebral artery narrowing, left

EXAM:
CT ANGIOGRAPHY HEAD AND NECK
TECHNIQUE: Multidetector CT imaging of the head and neck was performed using
the standard protocol during bolus administration of intravenous
contrast. Multiplanar CT image reconstructions and MIPs were
obtained to evaluate the vascular anatomy. Carotid stenosis
measurements (when applicable) are obtained utilizing NASCET
criteria, using the distal internal carotid diameter as the
denominator.

[Series 12: ax thin · axial · 0.42mm/px · z∈[-290,-49]mm · 5 of 363 slices shown]
[im 61/363  soft-tissue]
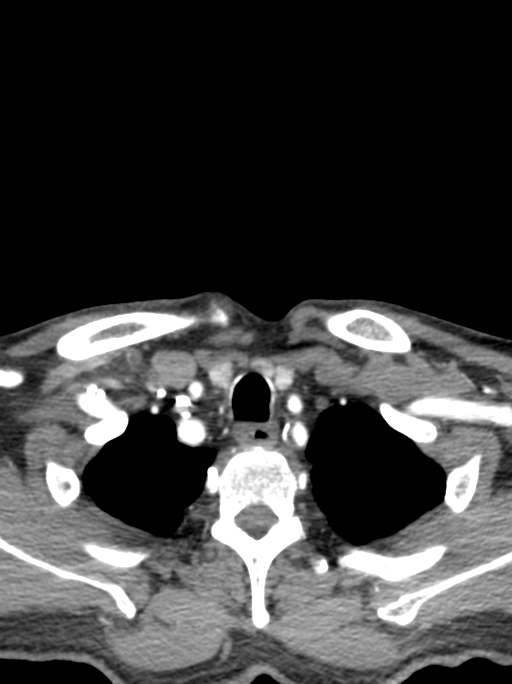
[im 121/363  bone]
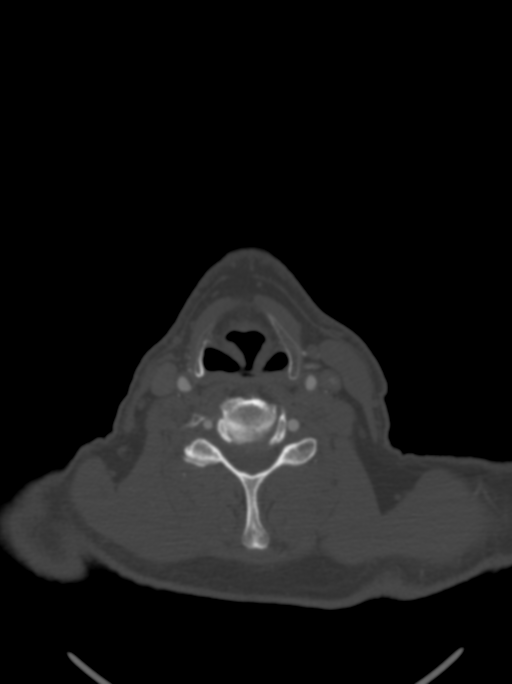
[im 182/363  soft-tissue]
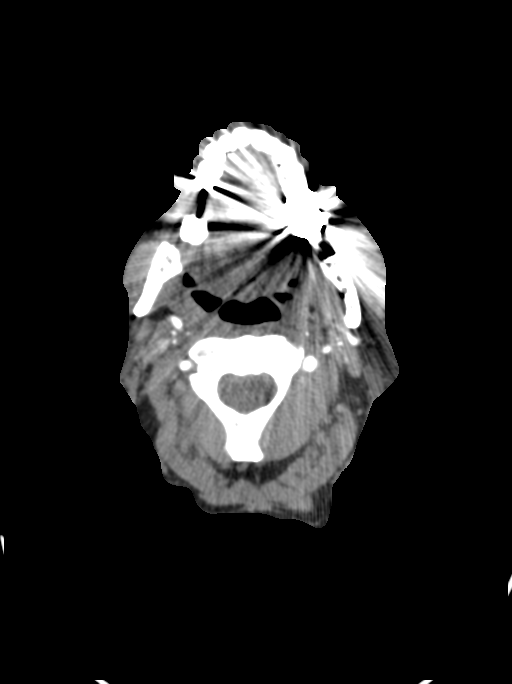
[im 242/363  bone]
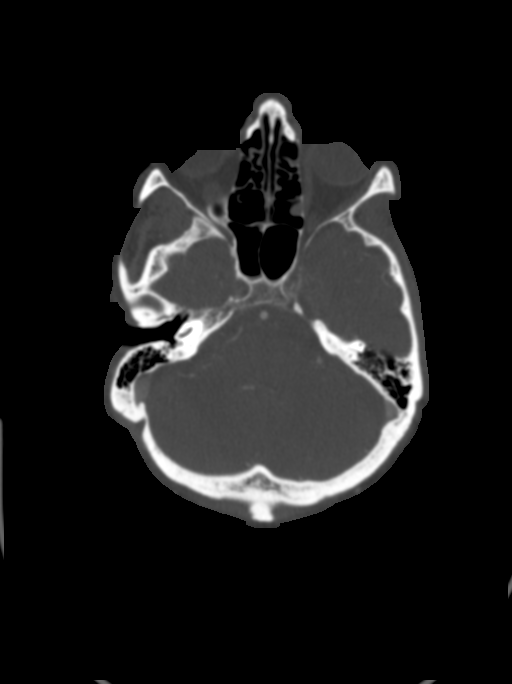
[im 302/363  soft-tissue]
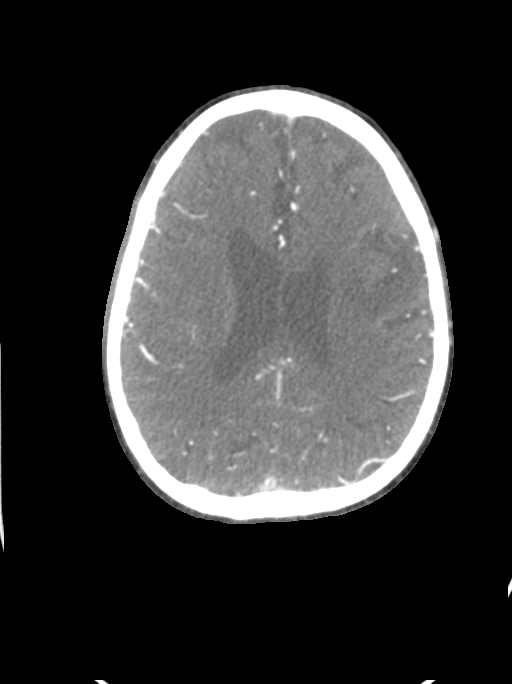

[5 of 33 positions shown; findings below may reference images not displayed]

RADIATION DOSE REDUCTION: This exam was performed according to the
departmental dose-optimization program which includes automated
exposure control, adjustment of the mA and/or kV according to
patient size and/or use of iterative reconstruction technique.

CONTRAST:  75mL OMNIPAQUE IOHEXOL 350 MG/ML SOLN
FINDINGS: CT HEAD

Brain: There is no acute intracranial hemorrhage or mass effect.
Multiple chronic infarcts are again identified primarily involving
bilateral frontoparietal lobes. Additional patchy hypoattenuation in
the supratentorial white matter is nonspecific but may reflect
similar chronic microvascular ischemic changes. Prominence of the
ventricles and sulci reflects similar parenchymal volume loss. There
is no extra-axial collection.

Vascular: Better evaluated on CTA portion.

Skull: Calvarium is unremarkable.

Sinuses/Orbits: Right maxillary sinus retention cyst or polyp.
Orbits are unremarkable.

Other: Mastoid air cells are clear.

Review of the MIP images confirms the above findings

CTA NECK

Aortic arch: Great vessel origins are patent. There is mixed but
primarily noncalcified plaque along the proximal left subclavian
causing stable 50% stenosis. This appears similar.

Right carotid system: Primarily noncalcified plaque along the common
carotid has increased. There is less than 50% stenosis. Marked mixed
but primarily noncalcified plaque at the bifurcation and ICA origin
with visual occlusion. There is a reconstituted string of contrast
extending to the skull base.

Left carotid system: Primarily noncalcified plaque along the common
carotid has slightly increased with less than 50% stenosis. Mixed
but primarily noncalcified plaque at the bifurcation and proximal
internal carotid with occlusion.

Vertebral arteries: Patent. There are stents at the origins.
Approximately 50% stenosis within the right stent.

Skeleton: Degenerative changes of the cervical spine.

Other neck: Unremarkable.

Upper chest: Included upper lungs are clear.

Review of the MIP images confirms the above findings

CTA HEAD

Anterior circulation: Small caliber the intracranial right internal
carotid to the distal cavernous segment where there is
reconstitution. The remainder is patent with stable irregularity.
Minimal enhancement is present within the intracranial left
vertebral artery with small caliber throughout. Anterior and middle
cerebral arteries are patent.

Posterior circulation: Intracranial vertebral arteries are patent.
Basilar artery is patent. Posterior cerebral arteries are patent.

Venous sinuses: Patent as allowed by contrast bolus timing.

Review of the MIP images confirms the above findings
IMPRESSION: No acute intracranial abnormality.

Visually occluded proximal right ICA with subsequent string of
contrast extending to the skull base. Intracranially, there is
similar partial reconstitution at the distal cavernous portion.

Occluded left cervical ICA with similar partial reconstitution
intracranially.

Unchanged approximately 50% stenosis of the proximal left subclavian
artery. Patent stents at the vertebral artery origins. There is
approximately 50% in-stent stenosis on the right.

## 2022-05-22 MED ORDER — IOHEXOL 350 MG/ML SOLN
75.0000 mL | Freq: Once | INTRAVENOUS | Status: AC | PRN
  Administered 2022-05-22: 75 mL via INTRAVENOUS

## 2022-12-12 ENCOUNTER — Other Ambulatory Visit (HOSPITAL_COMMUNITY): Payer: Self-pay | Admitting: Interventional Radiology

## 2022-12-12 ENCOUNTER — Telehealth (HOSPITAL_COMMUNITY): Payer: Self-pay

## 2022-12-12 DIAGNOSIS — I771 Stricture of artery: Secondary | ICD-10-CM

## 2022-12-12 NOTE — Telephone Encounter (Signed)
Called to schedule cta head/neck, no answer, left vm. AW 

## 2022-12-24 ENCOUNTER — Ambulatory Visit (HOSPITAL_COMMUNITY): Payer: Medicare Other

## 2022-12-31 ENCOUNTER — Ambulatory Visit (HOSPITAL_COMMUNITY)
Admission: RE | Admit: 2022-12-31 | Discharge: 2022-12-31 | Disposition: A | Discharge status: Home / Self Care | Payer: Medicare Other | Source: Ambulatory Visit | Attending: Interventional Radiology | Admitting: Interventional Radiology

## 2022-12-31 ENCOUNTER — Encounter (HOSPITAL_COMMUNITY): Payer: Self-pay

## 2022-12-31 DIAGNOSIS — I771 Stricture of artery: Secondary | ICD-10-CM | POA: Insufficient documentation

## 2022-12-31 MED ORDER — IOHEXOL 350 MG/ML SOLN
75.0000 mL | Freq: Once | INTRAVENOUS | Status: AC | PRN
  Administered 2022-12-31: 75 mL via INTRAVENOUS

## 2022-12-31 MED ORDER — SODIUM CHLORIDE (PF) 0.9 % IJ SOLN
INTRAMUSCULAR | Status: AC
  Filled 2022-12-31: qty 50

## 2023-01-02 ENCOUNTER — Telehealth (HOSPITAL_COMMUNITY): Payer: Self-pay

## 2023-01-02 NOTE — Telephone Encounter (Signed)
Called pt regarding recent imaging, no answer, left vm. AB  

## 2023-08-27 DIAGNOSIS — Z23 Encounter for immunization: Secondary | ICD-10-CM

## 2024-06-06 ENCOUNTER — Encounter (HOSPITAL_COMMUNITY): Payer: Self-pay | Admitting: Interventional Radiology
